# Patient Record
Sex: Male | Born: 1950 | Race: White | Hispanic: No | Marital: Married | Smoking: Current every day smoker
Health system: Southern US, Community
[De-identification: ages and names within clinical notes are randomized; demographics above are authoritative.]

## PROBLEM LIST (undated history)

## (undated) DIAGNOSIS — J449 Chronic obstructive pulmonary disease, unspecified: Secondary | ICD-10-CM

## (undated) DIAGNOSIS — E119 Type 2 diabetes mellitus without complications: Secondary | ICD-10-CM

## (undated) DIAGNOSIS — J439 Emphysema, unspecified: Secondary | ICD-10-CM

## (undated) DIAGNOSIS — R7303 Prediabetes: Secondary | ICD-10-CM

## (undated) DIAGNOSIS — C801 Malignant (primary) neoplasm, unspecified: Secondary | ICD-10-CM

## (undated) HISTORY — DX: Chronic obstructive pulmonary disease, unspecified: J44.9

## (undated) HISTORY — PX: PELVIC ABCESS DRAINAGE: SHX2189

## (undated) HISTORY — DX: Emphysema, unspecified: J43.9

## (undated) HISTORY — DX: Malignant (primary) neoplasm, unspecified: C80.1

## (undated) HISTORY — DX: Type 2 diabetes mellitus without complications: E11.9

## (undated) HISTORY — PX: APPENDECTOMY: SHX54

## (undated) HISTORY — DX: Prediabetes: R73.03

---

## 2014-07-06 ENCOUNTER — Emergency Department: Payer: Self-pay | Admitting: Emergency Medicine

## 2023-01-13 ENCOUNTER — Ambulatory Visit: Payer: Self-pay

## 2023-01-13 NOTE — Telephone Encounter (Signed)
  Chief Complaint: Frequency, urgency, painful urination. Also cloudy urine Symptoms: above Frequency: 5-6 days Pertinent Negatives: Patient denies fever Disposition: []$ ED /[x]$ Urgent Care (no appt availability in office) / []$ Appointment(In office/virtual)/ []$  [facility] [place]/ []$ Home Care/ []$ Refused Recommended Disposition /[]$ [facility]/ []$  Follow-up with PCP Additional Notes: PT has had s/s of a uti for 5-6 days. PT will go to UC for care.    Summary: possible UTI painful urination cloudy urine, frequent urination   Pt stated has possible UTI painful urination cloudy urine, frequent urination going on for about 5-6 days stated has been Azo it helped but has not cleared it up. Pt denied abdominal pain.  Pt scheduled for new pt appointment with [facility] for 02/14/2023.  Seeking clinical advice.     Reason for Disposition  Bad or foul-smelling urine  Answer Assessment - Initial Assessment Questions 1. SYMPTOM: "What's the main symptom you're concerned about?" (e.g., frequency, incontinence)     Frequency, painful urination, painful urination 2. ONSET: "When did the  *No Answer*  start?"     5-6 days ago 3. PAIN: "Is there any pain?" If Yes, ask: "How bad is it?" (Scale: 1-10; mild, moderate, severe)     yes 4. CAUSE: "What do you think is causing the symptoms?"     UTI 5. OTHER SYMPTOMS: "Do you have any other symptoms?" (e.g., blood in urine, fever, flank pain, pain with urination)     Pain with urination 6. PREGNANCY: "Is there any chance you are pregnant?" "When was your last menstrual period?"  Protocols used: Urinary Symptoms-A-AH

## 2023-02-13 ENCOUNTER — Telehealth: Payer: Self-pay

## 2023-02-13 NOTE — Telephone Encounter (Signed)
Patient has appointment with you tomorrow. Not sure if this message is just an [place]

## 2023-02-13 NOTE — Telephone Encounter (Signed)
Copied from [place] [phone]. Topic: General - Other >> Feb 13, 2023 12:48 PM Carrielelia G wrote: Reason for CRM: 4 wks ago patient had an appt.  with a provider at [facility] for a bladder infection, pt glucose was tested and it read at 255.  Provider suggested at this appt. Patient do a fasting glucose test.

## 2023-02-14 ENCOUNTER — Encounter: Payer: Self-pay | Admitting: Family Medicine

## 2023-02-14 ENCOUNTER — Ambulatory Visit (INDEPENDENT_AMBULATORY_CARE_PROVIDER_SITE_OTHER): Payer: Medicare PPO | Admitting: Family Medicine

## 2023-02-14 VITALS — BP 137/80 | HR 76 | Temp 98.1°F | Resp 14 | Ht 73.0 in | Wt 179.8 lb

## 2023-02-14 DIAGNOSIS — R81 Glycosuria: Secondary | ICD-10-CM | POA: Insufficient documentation

## 2023-02-14 DIAGNOSIS — R634 Abnormal weight loss: Secondary | ICD-10-CM | POA: Diagnosis not present

## 2023-02-14 DIAGNOSIS — Z7689 Persons encountering health services in other specified circumstances: Secondary | ICD-10-CM | POA: Insufficient documentation

## 2023-02-14 DIAGNOSIS — R300 Dysuria: Secondary | ICD-10-CM | POA: Insufficient documentation

## 2023-02-14 DIAGNOSIS — Z87891 Personal history of nicotine dependence: Secondary | ICD-10-CM | POA: Insufficient documentation

## 2023-02-14 LAB — POCT URINALYSIS DIPSTICK
Bilirubin, UA: NEGATIVE
Glucose, UA: POSITIVE — AB
Ketones, UA: NEGATIVE
Nitrite, UA: NEGATIVE
Protein, UA: NEGATIVE
Spec Grav, UA: 1.01 (ref 1.010–1.025)
Urobilinogen, UA: 0.2 E.U./dL
pH, UA: 6 (ref 5.0–8.0)

## 2023-02-14 MED ORDER — NITROFURANTOIN MONOHYD MACRO 100 MG PO CAPS: 100.0000 mg | ORAL_CAPSULE | Freq: Two times a day (BID) | ORAL | 0 refills | Status: DC

## 2023-02-14 NOTE — Progress Notes (Unsigned)
I,[name],acting as a scribe for [employer], MD.,have documented all relevant documentation on the behalf of Colin Foster, MD,as directed by  Colin Foster, MD while in the presence of Colin Foster, MD.  New patient visit   Patient: Colin Griffin   DOB: [birth year]   72 y.o. Male  MRN: [id number] Visit Date: 02/14/2023  Today's healthcare provider: [name], MD   Chief Complaint  Patient presents with   Colin Griffin is a 72 y.o. male who presents today as a new patient to establish care.  HPI   Urine Frequency: Patient is here is establish care but has been dealing with a bladder infection for a while. [facility] with bladder concerns. They did a UA and found blood and glucose in the urine. They did a fasting glucose test. Patient was put on Bactrim twice daily for 7 days. After about a week the symptoms came back. Patient symptoms are pain while using the bathroom, and using the bathroom more frequently. Reports that when he lies down to sleep at night, the urine frequency starts and has small amounts of urine. He reports that he was told he has MRSA in the urine, Reports hx of penile fracture and tissue damage, reports having prolonged irritation from stitching for the reconstruction of penile fracture, took 3 years for that to resolve   Pre-diabetes: Reports being pre-diabetes for close to 30 years   Non intentional weight loss: Reports that he lost 14 pounds since his visit in February, reports that he has seen eating and has not been experiencing nausea or vomiting   Social Hx  Wife works as a Therapist, sports  Patient has been married for 8 years   Past Medical History:  Diagnosis Date   Pre-diabetes    Past Surgical History:  Procedure Laterality Date   APPENDECTOMY     Family Status  Relation Name Status   Mother  (Not Specified)   MGM   (Not Specified)   Other  (Not Specified)   Family History  Problem Relation Age of Onset   Atrial fibrillation Mother    Cancer Maternal Grandmother    Diabetes Other    Social History   Socioeconomic History   Marital status: Married    Spouse name: Landscape architect   Number of children: 1   Years of education: Not on file   Highest education level: Master's degree (e.g., MA, MS, MEng, MEd, MSW, MBA)  Occupational History   Not on file  Tobacco Use   Smoking status: Every Day    Packs/day: 1.00    Years: 60.00    Additional pack years: 0.00    Total pack years: 60.00    Types: Cigarettes   Smokeless tobacco: Never   Tobacco comments:    Reports that at one time he smoked 3 packs per day while in the [employer]  Vaping Use   Vaping Use: Every day   Start date: 08/08/2022  Substance and Sexual Activity   Alcohol use: Never   Drug use: Never   Sexual activity: Not on file  Other Topics Concern   Not on file  Social History Narrative   ** Merged History Encounter **       Retired from Research officer, political party for 30 years    Social Determinants of Radio broadcast assistant Strain: Not on [employer] Insecurity: Not on file  Transportation Needs: Not on file  Physical Activity: Not on file  Stress: Not on file  Social Connections: Not on file   No outpatient medications prior to visit.   No facility-administered medications prior to visit.   No Known Allergies  Immunization History  Administered Date(s) Administered   PFIZER(Purple Top)SARS-COV-2 Vaccination 01/13/2020, 02/03/2020    Health Maintenance  Topic Date Due   Medicare Annual Wellness (AWV)  Never done   Pneumonia Vaccine 1+ Years old (1 of 2 - PCV) Never done   Hepatitis C Screening  Never done   DTaP/Tdap/Td (1 - Tdap) Never done   COLONOSCOPY (Pts 45-26yrs Insurance coverage will need to be confirmed)  Never done   Lung Cancer Screening  Never done   Zoster Vaccines- Shingrix (1 of 2) Never  done   INFLUENZA VACCINE  Never done   COVID-19 Vaccine (3 - 2023-24 season) 07/29/2022   HPV VACCINES  Aged Out    Patient Care Team: Colin Foster, MD as PCP - General (Family Medicine)  Review of Systems  Constitutional:  Positive for unexpected weight change. Negative for appetite change, chills and fever.  Respiratory:  Positive for cough.   Genitourinary:  Positive for difficulty urinating, dysuria and frequency.  All other systems reviewed and are negative.       Objective    BP 137/80 (BP Location: Left Arm, Patient Position: Sitting, Cuff Size: Normal)   Pulse 76   Temp 98.1 F (36.7 C) (Oral)   Resp 14   Ht 6\' 1"  (1.854 m)   Wt 179 lb 12.8 oz (81.6 kg)   BMI 23.72 kg/m     Physical Exam Vitals reviewed.  Constitutional:      General: He is not in acute distress.    Appearance: Normal appearance. He is not ill-appearing, toxic-appearing or diaphoretic.  Eyes:     Conjunctiva/sclera: Conjunctivae normal.  Neck:     Thyroid: No thyroid mass, thyromegaly or thyroid tenderness.     Vascular: No carotid bruit.  Cardiovascular:     Rate and Rhythm: Normal rate and regular rhythm.     Pulses: Normal pulses.     Heart sounds: Normal heart sounds. No murmur heard.    No friction rub. No gallop.  Pulmonary:     Effort: Pulmonary effort is normal. No respiratory distress.     Breath sounds: Normal breath sounds. No stridor. No wheezing, rhonchi or rales.  Abdominal:     General: Bowel sounds are normal. There is no distension.     Palpations: Abdomen is soft. There is no hepatomegaly, splenomegaly or mass.     Tenderness: There is abdominal tenderness in the suprapubic area.  Musculoskeletal:     Right lower leg: No edema.     Left lower leg: No edema.  Lymphadenopathy:     Cervical: No cervical adenopathy.  Skin:    Findings: No erythema or rash.  Neurological:     Mental Status: He is alert and oriented to person, place, and time.      Depression Screen    02/14/2023    2:20 PM  PHQ 2/9 Scores  PHQ - 2 Score 0  PHQ- 9 Score 0   Results for orders placed or performed in visit on 02/14/23  Hemoglobin A1c  Result Value Ref Range   Hgb A1c MFr Bld 11.1 (H) 4.8 - 5.6 %   Est. average glucose Bld gHb Est-mCnc 272 mg/dL  Comprehensive metabolic panel  Result Value Ref Range  Glucose 211 (H) 70 - 99 mg/dL   BUN 12 8 - 27 mg/dL   Creatinine, Ser 0.87 0.76 - 1.27 mg/dL   eGFR 93 >59 mL/min/1.73   BUN/Creatinine Ratio 14 10 - 24   Sodium 136 134 - 144 mmol/L   Potassium 3.9 3.5 - 5.2 mmol/L   Chloride 100 96 - 106 mmol/L   CO2 20 20 - 29 mmol/L   Calcium 9.4 8.6 - 10.2 mg/dL   Total Protein 7.1 6.0 - 8.5 g/dL   Albumin 4.1 3.9 - 4.9 g/dL   Globulin, Total 3.0 1.5 - 4.5 g/dL   Albumin/Globulin Ratio 1.4 1.2 - 2.2   Bilirubin Total 0.4 0.0 - 1.2 mg/dL   Alkaline Phosphatase 118 44 - 121 IU/L   AST 16 0 - 40 IU/L   ALT 17 0 - 44 IU/L  Urine Microscopic  Result Value Ref Range   WBC, UA >30 (A) 0 - 5 /hpf   RBC, Urine None seen 0 - 2 /hpf   Epithelial Cells (non renal) None seen 0 - 10 /hpf   Casts None seen None seen /lpf   Bacteria, UA None seen None seen/Few  TSH + free T4  Result Value Ref Range   TSH 0.641 0.450 - 4.500 uIU/mL   Free T4 1.35 0.82 - 1.77 ng/dL  CBC  Result Value Ref Range   WBC 14.4 (H) 3.4 - 10.8 x10E3/uL   RBC 4.29 4.14 - 5.80 x10E6/uL   Hemoglobin 13.3 13.0 - 17.7 g/dL   Hematocrit 38.7 37.5 - 51.0 %   MCV 90 79 - 97 fL   MCH 31.0 26.6 - 33.0 pg   MCHC 34.4 31.5 - 35.7 g/dL   RDW 12.1 11.6 - 15.4 %   Platelets 218 150 - 450 x10E3/uL  Specimen status report  Result Value Ref Range   specimen status report Comment   POCT urinalysis dipstick  Result Value Ref Range   Color, UA Yellow    Clarity, UA Cloudy    Glucose, UA Positive (A) Negative   Bilirubin, UA Negative    Ketones, UA Negative    Spec Grav, UA 1.010 1.010 - 1.025   Blood, UA Moderate    pH, UA 6.0 5.0 -  8.0   Protein, UA Negative Negative   Urobilinogen, UA 0.2 0.2 or 1.0 E.U./dL   Nitrite, UA Negative    Leukocytes, UA Moderate (2+) (A) Negative   Appearance     Odor      Assessment & Plan      Problem List Items Addressed This Visit       Other   Weight loss, non-intentional    Patient reports over 10 pound weight loss in the last month Will order CBC, CMP, A1c, TSH and free T4 and referral for chest CT submitted today Given patient's smoking history and bladder symptoms we will be important for him to have appropriate follow-up for specialist referral submitted today regarding urology and lung cancer screening Patient also reports symptoms of cough, no night sweats       Relevant Orders   Comprehensive metabolic panel (Completed)   TSH + free T4   CBC   Glucosuria    Patient with 2 urinalyses with glucosuria Previous hemoglobin A1c's have been in prediabetic range Will repeat hemoglobin A1c today       Relevant Orders   Hemoglobin A1c (Completed)   Dysuria - Primary    Symptoms have been present for weeks, previously  treated with Bactrim, completed course Repeat urinalysis and urine culture collected today Urine analysis consistent with hematuria, Previous culture was reviewed from urgent care visit on 01/13/2023 that grew MRSA that was resistant to ciprofloxacin and oxacillin and intermediate for levofloxacin Will treat patient with nitrofurantoin for 7-day course Place referral for urology as well given persistent symptoms despite treatment      Relevant Orders   POCT urinalysis dipstick (Completed)   Urine Culture   Ambulatory referral to Urology   Urine Microscopic (Completed)   History of smoking greater than 50 pack years    Patient continues to smoke daily Reports smoking for at least 60 years anywhere from 1 pack/day to 3 packs/day throughout that time Recommended referral for chest CT lung cancer screening, patient was agreeable Referral placed  today Counseled patient on importance of smoking cessation      Relevant Orders   Ambulatory Referral Lung Cancer Screening [facility]   Urine Microscopic (Completed)   Establishing care with new doctor, encounter for    Welcomed patient to [facility] patient's medical history, medications, surgical and social history Discussed roles and expectations for primary care physician-patient relationship Recommended patient schedule annual preventative examinations          Return in about 2 months (around 04/16/2023) for AWV .      The entirety of the information documented in the History of Present Illness, Review of Systems and Physical Exam were personally obtained by me. Portions of this information were initially documented by [name], CMA. I, Colin Foster, MD have reviewed the documentation above for thoroughness and accuracy.   Colin Foster, MD  [facility] [phone] (phone) [phone] (fax)  [place]

## 2023-02-14 NOTE — Patient Instructions (Addendum)
It was a pleasure meeting you today!  Welcome to [facility].  I look forward to taking part in your care as your new primary care physician.    Summary of our discussion today:   For your urinary symptoms, we will treat you with Macrobid twice daily for 7 days.  We will collect lab work today to investigate reasons for weight loss  I have placed a referral for urology. Please on the lookout for a call to schedule this appointment and let us know if you do not hear anything after two weeks.  I have also placed a referral for chest CT screening, please be on the lookout for a call.    You should return to our clinic in 2 months   Best Wishes,   Dr. Quentin Cornwall

## 2023-02-14 NOTE — Assessment & Plan Note (Signed)
Welcomed patient to [facility]  Reviewed patient's medical history, medications, surgical and social history Discussed roles and expectations for primary care physician-patient relationship Recommended patient schedule annual preventative examinations   

## 2023-02-15 ENCOUNTER — Other Ambulatory Visit: Payer: Self-pay | Admitting: Family Medicine

## 2023-02-15 ENCOUNTER — Encounter: Payer: Self-pay | Admitting: Family Medicine

## 2023-02-15 DIAGNOSIS — E1165 Type 2 diabetes mellitus with hyperglycemia: Secondary | ICD-10-CM | POA: Insufficient documentation

## 2023-02-15 LAB — CBC
Hematocrit: 38.7 % (ref 37.5–51.0)
Hemoglobin: 13.3 g/dL (ref 13.0–17.7)
MCH: 31 pg (ref 26.6–33.0)
MCHC: 34.4 g/dL (ref 31.5–35.7)
MCV: 90 fL (ref 79–97)
Platelets: 218 10*3/uL (ref 150–450)
RBC: 4.29 x10E6/uL (ref 4.14–5.80)
RDW: 12.1 % (ref 11.6–15.4)
WBC: 14.4 10*3/uL — ABNORMAL HIGH (ref 3.4–10.8)

## 2023-02-15 LAB — COMPREHENSIVE METABOLIC PANEL
ALT: 17 IU/L (ref 0–44)
AST: 16 IU/L (ref 0–40)
Albumin/Globulin Ratio: 1.4 (ref 1.2–2.2)
Albumin: 4.1 g/dL (ref 3.9–4.9)
Alkaline Phosphatase: 118 IU/L (ref 44–121)
BUN/Creatinine Ratio: 14 (ref 10–24)
BUN: 12 mg/dL (ref 8–27)
Bilirubin Total: 0.4 mg/dL (ref 0.0–1.2)
CO2: 20 mmol/L (ref 20–29)
Calcium: 9.4 mg/dL (ref 8.6–10.2)
Chloride: 100 mmol/L (ref 96–106)
Creatinine, Ser: 0.87 mg/dL (ref 0.76–1.27)
Globulin, Total: 3 g/dL (ref 1.5–4.5)
Glucose: 211 mg/dL — ABNORMAL HIGH (ref 70–99)
Potassium: 3.9 mmol/L (ref 3.5–5.2)
Sodium: 136 mmol/L (ref 134–144)
Total Protein: 7.1 g/dL (ref 6.0–8.5)
eGFR: 93 mL/min/{1.73_m2} (ref >59)

## 2023-02-15 LAB — SPECIMEN STATUS REPORT

## 2023-02-15 LAB — URINALYSIS, MICROSCOPIC ONLY
Bacteria, UA: NONE SEEN
Casts: NONE SEEN /lpf
Epithelial Cells (non renal): NONE SEEN /hpf (ref 0–10)
RBC, Urine: NONE SEEN /hpf (ref 0–2)
WBC, UA: >30 /hpf — AB (ref 0–5)

## 2023-02-15 LAB — TSH+FREE T4
Free T4: 1.35 ng/dL (ref 0.82–1.77)
TSH: 0.641 u[IU]/mL (ref 0.450–4.500)

## 2023-02-15 LAB — HEMOGLOBIN A1C
Est. average glucose Bld gHb Est-mCnc: 272 mg/dL
Hgb A1c MFr Bld: 11.1 % — ABNORMAL HIGH (ref 4.8–5.6)

## 2023-02-15 MED ORDER — METFORMIN HCL ER 500 MG PO TB24: 500.0000 mg | ORAL_TABLET | Freq: Every day | ORAL | 1 refills | Status: DC

## 2023-02-15 NOTE — Assessment & Plan Note (Signed)
Symptoms have been present for weeks, previously treated with Bactrim, completed course Repeat urinalysis and urine culture collected today Urine analysis consistent with hematuria, Previous culture was reviewed from urgent care visit on 01/13/2023 that grew MRSA that was resistant to ciprofloxacin and oxacillin and intermediate for levofloxacin Will treat patient with nitrofurantoin for 7-day course Place referral for urology as well given persistent symptoms despite treatment

## 2023-02-15 NOTE — Assessment & Plan Note (Signed)
Patient continues to smoke daily Reports smoking for at least 60 years anywhere from 1 pack/day to 3 packs/day throughout that time Recommended referral for chest CT lung cancer screening, patient was agreeable Referral placed today Counseled patient on importance of smoking cessation

## 2023-02-15 NOTE — Assessment & Plan Note (Addendum)
Patient reports over 10 pound weight loss in the last month Will order CBC, CMP, A1c, TSH and free T4 and referral for chest CT submitted today Given patient's smoking history and bladder symptoms we will be important for him to have appropriate follow-up for specialist referral submitted today regarding urology and lung cancer screening Patient also reports symptoms of cough, no night sweats

## 2023-02-15 NOTE — Assessment & Plan Note (Signed)
Patient with 2 urinalyses with glucosuria Previous hemoglobin A1c's have been in prediabetic range Will repeat hemoglobin A1c today

## 2023-02-17 LAB — URINE CULTURE

## 2023-02-17 LAB — SPECIMEN STATUS REPORT

## 2023-02-17 NOTE — Progress Notes (Unsigned)
I,Colin Griffin,acting as a scribe for [employer], MD.,have documented all relevant documentation on the behalf of Colin Foster, MD,as directed by  Colin Foster, MD while in the presence of Colin Foster, MD.   Established patient visit   Patient: Colin Griffin   DOB: [birth year]   72 y.o. Male  MRN: [id number] Visit Date: 02/20/2023  Today's healthcare provider: [name], MD   Chief Complaint  Patient presents with   Follow-up DM   Subjective    HPI  Type 2 DM Patient coming in to discuss DM treatment options and recommendations. States that he has started metformin and has changed his diet to include mostly grilled fish, grilled chicken has been eating cabbage, collard greens, pinto beans, steamed potatoes He reports he has eliminated sugar  He has started eating breakfast, two eggs in the AM  He denies upset stomach with metformin    Urinary Symptoms  States that he feels that his abdomen is more tender, he has worsened urinary frequency, reports that he was up every 20 minutes last night  States he feels like he is sitting on a brick  He reports he has decreased urinary output, dribbling despite going to the bathroom every 15-20 mins with frequency  Reports that putting on his belt is painful for his lower abdomen  He reports feeling like he may pass out due to pain and difficulty with urinating, states he has an increasingly difficult time getting urine to flow  Denies fevers  Denies back pain  States symptoms minimally improved with bactrim   Medications: Outpatient Medications Prior to Visit  Medication Sig   metFORMIN (GLUCOPHAGE-XR) 500 MG 24 hr tablet Take 1 tablet (500 mg total) by mouth daily with breakfast.   [DISCONTINUED] nitrofurantoin, macrocrystal-monohydrate, (MACROBID) 100 MG capsule Take 1 capsule (100 mg total) by mouth 2 (two) times daily for 7 days.   No facility-administered  medications prior to visit.    Review of Systems     Objective    BP (!) 148/82 (BP Location: Left Arm, Patient Position: Sitting, Cuff Size: Normal)   Pulse 79   Temp 98.6 F (37 C) (Oral)   Resp 16   Wt 184 lb 8 oz (83.7 kg)   BMI 24.34 kg/m    Physical Exam Constitutional:      Appearance: He is ill-appearing. He is not toxic-appearing.     Comments: Patient appears uncomfortable while seated   Pulmonary:     Effort: Pulmonary effort is normal. No respiratory distress.  Abdominal:     Palpations: Abdomen is soft.     Tenderness: There is abdominal tenderness in the suprapubic area. There is no right CVA tenderness or left CVA tenderness.  Neurological:     Mental Status: He is alert.       No results found for any visits on 02/20/23.  Assessment & Plan     Problem List Items Addressed This Visit       Endocrine   Type 2 diabetes mellitus with hyperglycemia, without long-term current use of insulin ([place]) - Primary    Newly diagnosed  Lab Results  Component Value Date   HGBA1C 11.1 (H) 02/14/2023  With urinary symptoms and patient in obvious discomfort recommended that we avoid starting insulin and glucose checks right now  We discussed options for oral medications and injectables  Will start truclity 0.75mg  once weekly and follow up in 3 weeks  Will also have patient ramp up to  1000mg  twice daily        Relevant Medications   Dulaglutide (TRULICITY) [id number] [id number] SOPN     Other   Dysuria    Acute  Symptoms worsened since starting nitrofurantoin  Will treat as prostatitis with Bactrim 5800-160mg  twice daily for 10 days Given concern for increased pain, difficulty with urinating, and worsened urinary frequency, I discussed with patient concern that he may have structural component causing worsening urinary symptoms and would recommend ED visit if no improvement within the next 12-24 hours as he may need a catheter to help with emptying bladder  Patient  had to use the restroom twice due to increased frequency during office visit today  Urology was able to move patient to earlier appt on 03/01/23 instead of 03/08/23 after contacting them via phone today  Patient to follow up PRN          Return in about 3 weeks (around 03/13/2023) for diabetes follow up .        The entirety of the information documented in the History of Present Illness, Review of Systems and Physical Exam were personally obtained by me. Portions of this information were initially documented by Colin Griffin, CMA. I, Colin Foster, MD have reviewed the documentation above for thoroughness and accuracy.   Colin Foster, MD  [facility] [phone] (phone) [phone] (fax)  [place]

## 2023-02-20 ENCOUNTER — Other Ambulatory Visit: Payer: Self-pay

## 2023-02-20 ENCOUNTER — Encounter: Payer: Self-pay | Admitting: Family Medicine

## 2023-02-20 ENCOUNTER — Emergency Department
Admission: EM | Admit: 2023-02-20 | Discharge: 2023-02-20 | Disposition: A | Discharge status: Home / Self Care | Payer: Medicare PPO | Attending: Emergency Medicine | Admitting: Emergency Medicine

## 2023-02-20 ENCOUNTER — Ambulatory Visit (INDEPENDENT_AMBULATORY_CARE_PROVIDER_SITE_OTHER): Payer: Medicare PPO | Admitting: Family Medicine

## 2023-02-20 VITALS — BP 148/82 | HR 79 | Temp 98.6°F | Resp 16 | Wt 184.5 lb

## 2023-02-20 DIAGNOSIS — R300 Dysuria: Secondary | ICD-10-CM

## 2023-02-20 DIAGNOSIS — E119 Type 2 diabetes mellitus without complications: Secondary | ICD-10-CM | POA: Insufficient documentation

## 2023-02-20 DIAGNOSIS — R339 Retention of urine, unspecified: Secondary | ICD-10-CM | POA: Diagnosis not present

## 2023-02-20 DIAGNOSIS — E1165 Type 2 diabetes mellitus with hyperglycemia: Secondary | ICD-10-CM

## 2023-02-20 DIAGNOSIS — Z7984 Long term (current) use of oral hypoglycemic drugs: Secondary | ICD-10-CM | POA: Diagnosis not present

## 2023-02-20 LAB — URINALYSIS, ROUTINE W REFLEX MICROSCOPIC
Bacteria, UA: NONE SEEN
Bilirubin Urine: NEGATIVE
Glucose, UA: 150 mg/dL — AB
Hgb urine dipstick: NEGATIVE
Ketones, ur: 5 mg/dL — AB
Leukocytes,Ua: NEGATIVE
Nitrite: NEGATIVE
Protein, ur: NEGATIVE mg/dL
Specific Gravity, Urine: 1.008 (ref 1.005–1.030)
Squamous Epithelial / HPF: NONE SEEN /HPF (ref 0–5)
pH: 6 (ref 5.0–8.0)

## 2023-02-20 LAB — COMPREHENSIVE METABOLIC PANEL
ALT: 22 U/L (ref 0–44)
AST: 20 U/L (ref 15–41)
Albumin: 3.5 g/dL (ref 3.5–5.0)
Alkaline Phosphatase: 87 U/L (ref 38–126)
Anion gap: 6 (ref 5–15)
BUN: 14 mg/dL (ref 8–23)
CO2: 22 mmol/L (ref 22–32)
Calcium: 8.8 mg/dL — ABNORMAL LOW (ref 8.9–10.3)
Chloride: 103 mmol/L (ref 98–111)
Creatinine, Ser: 0.82 mg/dL (ref 0.61–1.24)
GFR, Estimated: >60 mL/min (ref >60)
Glucose, Bld: 148 mg/dL — ABNORMAL HIGH (ref 70–99)
Potassium: 3.9 mmol/L (ref 3.5–5.1)
Sodium: 131 mmol/L — ABNORMAL LOW (ref 135–145)
Total Bilirubin: 0.8 mg/dL (ref 0.3–1.2)
Total Protein: 8.1 g/dL (ref 6.5–8.1)

## 2023-02-20 LAB — CBC
HCT: 38.6 % — ABNORMAL LOW (ref 39.0–52.0)
Hemoglobin: 12.9 g/dL — ABNORMAL LOW (ref 13.0–17.0)
MCH: 30.5 pg (ref 26.0–34.0)
MCHC: 33.4 g/dL (ref 30.0–36.0)
MCV: 91.3 fL (ref 80.0–100.0)
Platelets: 331 10*3/uL (ref 150–400)
RBC: 4.23 MIL/uL (ref 4.22–5.81)
RDW: 12.1 % (ref 11.5–15.5)
WBC: 17.1 10*3/uL — ABNORMAL HIGH (ref 4.0–10.5)
nRBC: 0 % (ref 0.0–0.2)

## 2023-02-20 MED ORDER — SULFAMETHOXAZOLE-TRIMETHOPRIM 800-160 MG PO TABS: 1.0000 | ORAL_TABLET | Freq: Two times a day (BID) | ORAL | 0 refills | Status: DC

## 2023-02-20 MED ORDER — TRULICITY 0.75 MG/0.5ML ~~LOC~~ SOAJ: 0.7500 mg | SUBCUTANEOUS | 3 refills | Status: DC

## 2023-02-20 NOTE — ED Notes (Signed)
Foley catheter output 1,400 mL emptied at this time

## 2023-02-20 NOTE — ED Triage Notes (Signed)
Pt to ED for UTI for a month, reports is now only able to urinate a little at a time. Has been on antibiotics.

## 2023-02-20 NOTE — ED Provider Notes (Signed)
[facility] Provider Note    Event Date/Time   First MD Initiated Contact with Patient 02/20/23 1646     (approximate)   History      HPI  Colin Griffin is a 72 y.o. male who presents with dysuria.  Patient reports nearly 6 weeks of symptoms, was put on Bactrim early on with some improvement in symptoms, saw new PCP recently and was diagnosed with diabetes, started metformin, also tried Macrobid with little improvement.  Was started on Bactrim again today.  Has difficulty urinating at times and does feel discomfort between his scrotum and his rectum.  No fevers or chills.  No nausea or vomiting.  No abdominal pain.  No back pain or flank pain        Physical Exam   Triage Vital Signs: ED Triage Vitals  Enc Vitals Group     BP 02/20/23 1627 (!) 150/75     Pulse Rate 02/20/23 1627 84     Resp 02/20/23 1627 18     Temp 02/20/23 1627 97.8 F (36.6 C)     Temp Source 02/20/23 1627 Oral     SpO2 02/20/23 1627 97 %     Weight 02/20/23 1628 83.5 kg (184 lb)     Height 02/20/23 1628 1.854 m (6\' 1" )     Head Circumference --      Peak Flow --      Pain Score 02/20/23 1627 7     Pain Loc --      Pain Edu? --      Excl. in [place]? --     Most recent vital signs: Vitals:   02/20/23 1627  BP: (!) 150/75  Pulse: 84  Resp: 18  Temp: 97.8 F (36.6 C)  SpO2: 97%     General: Awake, no distress.  CV:  Good peripheral perfusion.  Resp:  Normal effort.  Abd:  No distention.  Other:     ED Results / Procedures / Treatments   Labs (all labs ordered are listed, but only abnormal results are displayed) Labs Reviewed  URINALYSIS, ROUTINE W REFLEX MICROSCOPIC - Abnormal; Notable for the following components:      Result Value   Color, Urine YELLOW (*)    APPearance CLEAR (*)    Glucose, UA 150 (*)    Ketones, ur 5 (*)    All other components within normal limits  CBC - Abnormal; Notable for the following components:   WBC 17.1 (*)     Hemoglobin 12.9 (*)    HCT 38.6 (*)    All other components within normal limits  COMPREHENSIVE METABOLIC PANEL - Abnormal; Notable for the following components:   Sodium 131 (*)    Glucose, Bld 148 (*)    Calcium 8.8 (*)    All other components within normal limits  URINE CULTURE     EKG     RADIOLOGY     PROCEDURES:  Critical Care performed:   Procedures   MEDICATIONS ORDERED IN ED: Medications - No data to display   IMPRESSION / MDM / [place] / ED COURSE  I reviewed the triage vital signs and the nursing notes. Patient's presentation is most consistent with acute presentation with potential threat to life or bodily function.  Patient presents with ongoing UTI symptoms appear to be worsening and may have caused prostatitis as well.  Patient had a culture performed on March 19 which grew Staph aureus which is an unusual  UTI bacteria, to be clear this was not labeled as methicillin-resistant and hence could theoretically be treated with Augmentin or a cephalosporin  Will obtain bladder scan, check labs, repeat urinalysis and culture here,   Bladder scan demonstrates over a liter, Foley catheter placed and the patient had immediate relief of all discomfort.  His urinalysis is unremarkable, his white blood cell count is elevated likely related to significant and prolonged urinary retention  Will send urine culture but at this time no antibiotics indicated given that patient has had resolution of symptoms.  He has urology follow-up for further evaluation of difficulty urinating/urinary retention  Will discharge with Foley leg bag, return precautions discussed        FINAL CLINICAL IMPRESSION(S) / ED DIAGNOSES   Final diagnoses:  Urinary retention     Rx / DC Orders   ED Discharge Orders          Ordered    Ambulatory referral to Urology        02/20/23 1815             Note:  This document was prepared using Dragon voice recognition  software and may include unintentional dictation errors.   Lavonia Drafts, MD 02/20/23 [phone]

## 2023-02-20 NOTE — ED Notes (Signed)
Foley catheter placed at this time. 16 french.

## 2023-02-20 NOTE — ED Notes (Signed)
Bladder scan done at this time. Bladder scan showed >999

## 2023-02-20 NOTE — ED Notes (Signed)
Pt able to give urine sample. 

## 2023-02-20 NOTE — Patient Instructions (Addendum)
It was a pleasure to see you today!  Thank you for choosing [facility] for your primary care.   Colin Griffin was seen for diabetes and urinary symptoms.   Our plans for today were: We will work to see if we can get your urology appointment sooner than April 10th  I recommend going to the emergency department if pain worsens or you are unable to fully empty your bladder, I have written for bactrim twice daily for 10 days.  For your diabetes, please increase to metformin 1000mg  daily  I have added Trulicity 0.75mg  once per week   To keep you healthy, please keep in mind the following health maintenance items that you are due for:   Diabetes eye exam   Pneumonia Vaccine  Shingrix vaccine  Colonoscopy  Tetanus booster    You should return to our clinic in 3 weeks for diabetes follow up   Best Wishes,   Dr. Quentin Cornwall

## 2023-02-20 NOTE — Assessment & Plan Note (Signed)
Newly diagnosed  Lab Results  Component Value Date   HGBA1C 11.1 (H) 02/14/2023   With urinary symptoms and patient in obvious discomfort recommended that we avoid starting insulin and glucose checks right now  We discussed options for oral medications and injectables  Will start truclity 0.75mg  once weekly and follow up in 3 weeks  Will also have patient ramp up to 1000mg  twice daily

## 2023-02-20 NOTE — Assessment & Plan Note (Signed)
Acute  Symptoms worsened since starting nitrofurantoin  Will treat as prostatitis with Bactrim 5800-160mg  twice daily for 10 days Given concern for increased pain, difficulty with urinating, and worsened urinary frequency, I discussed with patient concern that he may have structural component causing worsening urinary symptoms and would recommend ED visit if no improvement within the next 12-24 hours as he may need a catheter to help with emptying bladder  Patient had to use the restroom twice due to increased frequency during office visit today  Urology was able to move patient to earlier appt on 03/01/23 instead of 03/08/23 after contacting them via phone today  Patient to follow up PRN

## 2023-02-22 ENCOUNTER — Other Ambulatory Visit: Payer: Self-pay | Admitting: Family Medicine

## 2023-02-22 ENCOUNTER — Telehealth: Payer: Self-pay

## 2023-02-22 LAB — URINE CULTURE: Culture: NO GROWTH

## 2023-02-22 MED ORDER — TAMSULOSIN HCL 0.4 MG PO CAPS: 0.4000 mg | ORAL_CAPSULE | Freq: Two times a day (BID) | ORAL | 0 refills | Status: DC

## 2023-02-22 MED ORDER — SOLIFENACIN SUCCINATE 5 MG PO TABS: 5.0000 mg | ORAL_TABLET | Freq: Every day | ORAL | 0 refills | Status: DC

## 2023-02-22 NOTE — Telephone Encounter (Signed)
Please Review for dr. Quentin Cornwall and advise.

## 2023-02-22 NOTE — Telephone Encounter (Signed)
Copied from [place] [id number]. Topic: General - Other >> Feb 22, 2023 10:55 AM Oley Balm A wrote: Reason for CRM: Pt states after he seen at the office the other day he did have to wind up going to the ER. The ER took 1500 ml out of his bladder and was told that it look like the bladder infection is gone but his white blood count is still up. The ER doctor did put a foley in and does not like leaving them in more than 3 days but pt does not have an appt with Urology until 03/01/23. Pt is needing advise from PCP.  Please call pt back.

## 2023-02-24 NOTE — Telephone Encounter (Signed)
Patient advised.

## 2023-02-24 NOTE — Telephone Encounter (Signed)
It is ok for him to keep the foley catheter until he is seen at urology on 4/3

## 2023-02-27 ENCOUNTER — Ambulatory Visit: Payer: Self-pay | Admitting: *Deleted

## 2023-02-27 MED ORDER — TRAMADOL HCL 50 MG PO TABS: 50.0000 mg | ORAL_TABLET | Freq: Three times a day (TID) | ORAL | 0 refills | Status: DC | PRN

## 2023-02-27 NOTE — Telephone Encounter (Signed)
Patient advised. Stated he will probably go to [facility] ER.

## 2023-02-27 NOTE — Telephone Encounter (Signed)
I would recommend urgent evaluation with increasing pain in setting of foley and antibiotic treatment  I can send prescription to try and help with pain but if there is not improvement, I would recommend he be seen in the ED today.   [name], MD  [facility]

## 2023-02-27 NOTE — Telephone Encounter (Signed)
Summary: pain with urination   Per Agent: "Patient is in a lot of pain/urination he wants a medication for pain, has an appt in 2 days but the pain in unbearable. Possible blockage"       Chief Complaint: Rectal pain Symptoms: Pt has foley cath, states is draining well. Reports 7-8/10 rectal pain, onset "Same time all this started". Seen in OV and sent to ED 02/20/23 ,on Bactrim. States "Some kind of infection."  Frequency: 02/17/24 Pertinent Negatives: Patient denies  Disposition: [] ED /[] Urgent Care (no appt availability in office) / [] Appointment(In office/virtual)/ []  [employer] [facility]/ [] Home Care/ [] Refused Recommended Disposition /[] [place] [facility]/ [x]  Follow-up with PCP Additional Notes: Pt has urology appt 03/01/23, new pt. Requesting pain med. Has tried OTC meds, ineffective. Seen 02/20/23.   Please advise.  Reason for Disposition  All other urine symptoms  Answer Assessment - Initial Assessment Questions 1. SYMPTOM: "What's the main symptom you're concerned about?" (e.g., frequency, incontinence)      2. ONSET: "When did the start?"      3. PAIN: "Is there any pain?" If Yes, ask: "How bad is it?" (Scale: 1-10; mild, moderate, severe)     7-8/10 4. CAUSE: "What do you think is causing the symptoms?"     :Mass" 5. OTHER SYMPTOMS: "Do you have any other symptoms?" (e.g., blood in urine, fever, flank pain, pain with urination)     Constant in rectum  Protocols used: Urinary Symptoms-A-AH

## 2023-03-01 ENCOUNTER — Encounter: Payer: Self-pay | Admitting: Urology

## 2023-03-01 ENCOUNTER — Ambulatory Visit: Payer: Medicare PPO | Admitting: Urology

## 2023-03-01 ENCOUNTER — Other Ambulatory Visit: Payer: Self-pay

## 2023-03-01 ENCOUNTER — Ambulatory Visit
Admission: RE | Admit: 2023-03-01 | Discharge: 2023-03-01 | Disposition: A | Discharge status: Home / Self Care | Payer: Medicare PPO | Source: Ambulatory Visit | Attending: Urology | Admitting: Urology

## 2023-03-01 ENCOUNTER — Encounter: Payer: Self-pay | Admitting: *Deleted

## 2023-03-01 ENCOUNTER — Emergency Department: Payer: Medicare PPO

## 2023-03-01 ENCOUNTER — Inpatient Hospital Stay
Admission: EM | Admit: 2023-03-01 | Discharge: 2023-03-07 | DRG: 713 | Disposition: A | Discharge status: Home / Self Care | Payer: Medicare PPO | Source: Ambulatory Visit | Attending: Internal Medicine | Admitting: Internal Medicine

## 2023-03-01 VITALS — BP 124/68 | HR 93 | Ht 72.0 in | Wt 175.0 lb

## 2023-03-01 DIAGNOSIS — K5900 Constipation, unspecified: Secondary | ICD-10-CM | POA: Diagnosis present

## 2023-03-01 DIAGNOSIS — Z833 Family history of diabetes mellitus: Secondary | ICD-10-CM

## 2023-03-01 DIAGNOSIS — R339 Retention of urine, unspecified: Secondary | ICD-10-CM

## 2023-03-01 DIAGNOSIS — N412 Abscess of prostate: Secondary | ICD-10-CM | POA: Diagnosis present

## 2023-03-01 DIAGNOSIS — R61 Generalized hyperhidrosis: Secondary | ICD-10-CM | POA: Diagnosis not present

## 2023-03-01 DIAGNOSIS — K651 Peritoneal abscess: Secondary | ICD-10-CM | POA: Diagnosis present

## 2023-03-01 DIAGNOSIS — B9561 Methicillin susceptible Staphylococcus aureus infection as the cause of diseases classified elsewhere: Secondary | ICD-10-CM | POA: Diagnosis present

## 2023-03-01 DIAGNOSIS — Z7984 Long term (current) use of oral hypoglycemic drugs: Secondary | ICD-10-CM | POA: Diagnosis not present

## 2023-03-01 DIAGNOSIS — M25552 Pain in left hip: Secondary | ICD-10-CM | POA: Diagnosis not present

## 2023-03-01 DIAGNOSIS — R634 Abnormal weight loss: Secondary | ICD-10-CM | POA: Diagnosis present

## 2023-03-01 DIAGNOSIS — E1165 Type 2 diabetes mellitus with hyperglycemia: Secondary | ICD-10-CM | POA: Diagnosis not present

## 2023-03-01 DIAGNOSIS — R1900 Intra-abdominal and pelvic swelling, mass and lump, unspecified site: Secondary | ICD-10-CM

## 2023-03-01 DIAGNOSIS — Z79899 Other long term (current) drug therapy: Secondary | ICD-10-CM | POA: Diagnosis not present

## 2023-03-01 DIAGNOSIS — Z87440 Personal history of urinary (tract) infections: Secondary | ICD-10-CM | POA: Diagnosis not present

## 2023-03-01 DIAGNOSIS — R7401 Elevation of levels of liver transaminase levels: Secondary | ICD-10-CM | POA: Diagnosis present

## 2023-03-01 DIAGNOSIS — C61 Malignant neoplasm of prostate: Secondary | ICD-10-CM | POA: Diagnosis present

## 2023-03-01 DIAGNOSIS — N419 Inflammatory disease of prostate, unspecified: Secondary | ICD-10-CM | POA: Diagnosis present

## 2023-03-01 DIAGNOSIS — R7989 Other specified abnormal findings of blood chemistry: Secondary | ICD-10-CM | POA: Diagnosis present

## 2023-03-01 DIAGNOSIS — R748 Abnormal levels of other serum enzymes: Secondary | ICD-10-CM | POA: Diagnosis present

## 2023-03-01 DIAGNOSIS — F17290 Nicotine dependence, other tobacco product, uncomplicated: Secondary | ICD-10-CM | POA: Diagnosis present

## 2023-03-01 DIAGNOSIS — Z7985 Long-term (current) use of injectable non-insulin antidiabetic drugs: Secondary | ICD-10-CM

## 2023-03-01 LAB — HEPATIC FUNCTION PANEL
ALT: 48 U/L — ABNORMAL HIGH (ref 0–44)
AST: 40 U/L (ref 15–41)
Albumin: 3.2 g/dL — ABNORMAL LOW (ref 3.5–5.0)
Alkaline Phosphatase: 186 U/L — ABNORMAL HIGH (ref 38–126)
Bilirubin, Direct: 0.2 mg/dL (ref 0.0–0.2)
Indirect Bilirubin: 0.6 mg/dL (ref 0.3–0.9)
Total Bilirubin: 0.8 mg/dL (ref 0.3–1.2)
Total Protein: 8.4 g/dL — ABNORMAL HIGH (ref 6.5–8.1)

## 2023-03-01 LAB — CBC
HCT: 38.4 % — ABNORMAL LOW (ref 39.0–52.0)
Hemoglobin: 12.6 g/dL — ABNORMAL LOW (ref 13.0–17.0)
MCH: 30.1 pg (ref 26.0–34.0)
MCHC: 32.8 g/dL (ref 30.0–36.0)
MCV: 91.9 fL (ref 80.0–100.0)
Platelets: 369 10*3/uL (ref 150–400)
RBC: 4.18 MIL/uL — ABNORMAL LOW (ref 4.22–5.81)
RDW: 12.5 % (ref 11.5–15.5)
WBC: 17.8 10*3/uL — ABNORMAL HIGH (ref 4.0–10.5)
nRBC: 0 % (ref 0.0–0.2)

## 2023-03-01 LAB — BASIC METABOLIC PANEL
Anion gap: 11 (ref 5–15)
BUN: 17 mg/dL (ref 8–23)
CO2: 20 mmol/L — ABNORMAL LOW (ref 22–32)
Calcium: 9.1 mg/dL (ref 8.9–10.3)
Chloride: 99 mmol/L (ref 98–111)
Creatinine, Ser: 0.98 mg/dL (ref 0.61–1.24)
GFR, Estimated: >60 mL/min (ref >60)
Glucose, Bld: 134 mg/dL — ABNORMAL HIGH (ref 70–99)
Potassium: 4.6 mmol/L (ref 3.5–5.1)
Sodium: 130 mmol/L — ABNORMAL LOW (ref 135–145)

## 2023-03-01 LAB — LACTIC ACID, PLASMA: Lactic Acid, Venous: 1.6 mmol/L (ref 0.5–1.9)

## 2023-03-01 LAB — GLUCOSE, CAPILLARY: Glucose-Capillary: 184 mg/dL — ABNORMAL HIGH (ref 70–99)

## 2023-03-01 MED ORDER — BISACODYL 5 MG PO TBEC
5.0000 mg | DELAYED_RELEASE_TABLET | Freq: Every day | ORAL | Status: DC | PRN
  Administered 2023-03-03: 5 mg via ORAL
  Filled 2023-03-01: qty 1

## 2023-03-01 MED ORDER — SODIUM CHLORIDE 0.9 % IV SOLN: INTRAVENOUS | Status: DC

## 2023-03-01 MED ORDER — HYDROCODONE-ACETAMINOPHEN 5-325 MG PO TABS
1.0000 | ORAL_TABLET | ORAL | Status: DC | PRN
  Administered 2023-03-02 – 2023-03-07 (×7): 2 via ORAL
  Filled 2023-03-01 (×8): qty 2

## 2023-03-01 MED ORDER — HYDRALAZINE HCL 20 MG/ML IJ SOLN: 5.0000 mg | Freq: Four times a day (QID) | INTRAMUSCULAR | Status: DC | PRN

## 2023-03-01 MED ORDER — PIPERACILLIN-TAZOBACTAM 3.375 G IVPB 30 MIN: 3.3750 g | Freq: Once | INTRAVENOUS | Status: DC

## 2023-03-01 MED ORDER — ACETAMINOPHEN 650 MG RE SUPP: 650.0000 mg | Freq: Four times a day (QID) | RECTAL | Status: DC | PRN

## 2023-03-01 MED ORDER — SODIUM CHLORIDE 0.9% FLUSH
3.0000 mL | Freq: Two times a day (BID) | INTRAVENOUS | Status: DC
  Administered 2023-03-02 – 2023-03-07 (×8): 3 mL via INTRAVENOUS

## 2023-03-01 MED ORDER — ENOXAPARIN SODIUM 40 MG/0.4ML IJ SOSY: 40.0000 mg | PREFILLED_SYRINGE | INTRAMUSCULAR | Status: DC

## 2023-03-01 MED ORDER — IOHEXOL 300 MG/ML  SOLN
100.0000 mL | Freq: Once | INTRAMUSCULAR | Status: AC | PRN
  Administered 2023-03-01: 100 mL via INTRAVENOUS

## 2023-03-01 MED ORDER — SENNOSIDES-DOCUSATE SODIUM 8.6-50 MG PO TABS: 1.0000 | ORAL_TABLET | Freq: Every evening | ORAL | Status: DC | PRN

## 2023-03-01 MED ORDER — INSULIN ASPART 100 UNIT/ML IJ SOLN
0.0000 [IU] | Freq: Every day | INTRAMUSCULAR | Status: DC
  Administered 2023-03-03: 3 [IU] via SUBCUTANEOUS
  Administered 2023-03-05: 2 [IU] via SUBCUTANEOUS
  Filled 2023-03-01 (×2): qty 1

## 2023-03-01 MED ORDER — PIPERACILLIN-TAZOBACTAM 3.375 G IVPB
3.3750 g | Freq: Three times a day (TID) | INTRAVENOUS | Status: DC
  Administered 2023-03-01 – 2023-03-05 (×10): 3.375 g via INTRAVENOUS
  Filled 2023-03-01 (×10): qty 50

## 2023-03-01 MED ORDER — TRAZODONE HCL 50 MG PO TABS
25.0000 mg | ORAL_TABLET | Freq: Every evening | ORAL | Status: DC | PRN
  Administered 2023-03-02: 25 mg via ORAL
  Filled 2023-03-01: qty 1

## 2023-03-01 MED ORDER — VANCOMYCIN HCL IN DEXTROSE 1-5 GM/200ML-% IV SOLN: 1000.0000 mg | Freq: Once | INTRAVENOUS | Status: DC

## 2023-03-01 MED ORDER — VANCOMYCIN HCL 1750 MG/350ML IV SOLN
1750.0000 mg | Freq: Once | INTRAVENOUS | Status: AC
  Administered 2023-03-01: 1750 mg via INTRAVENOUS
  Filled 2023-03-01 (×2): qty 350

## 2023-03-01 MED ORDER — VANCOMYCIN HCL IN DEXTROSE 1-5 GM/200ML-% IV SOLN
1000.0000 mg | Freq: Two times a day (BID) | INTRAVENOUS | Status: DC
  Administered 2023-03-02 – 2023-03-04 (×6): 1000 mg via INTRAVENOUS
  Filled 2023-03-01 (×7): qty 200

## 2023-03-01 MED ORDER — ONDANSETRON HCL 4 MG/2ML IJ SOLN: 4.0000 mg | Freq: Four times a day (QID) | INTRAMUSCULAR | Status: DC | PRN

## 2023-03-01 MED ORDER — ACETAMINOPHEN 325 MG PO TABS: 650.0000 mg | ORAL_TABLET | Freq: Four times a day (QID) | ORAL | Status: DC | PRN

## 2023-03-01 MED ORDER — INSULIN ASPART 100 UNIT/ML IJ SOLN
0.0000 [IU] | Freq: Three times a day (TID) | INTRAMUSCULAR | Status: DC
  Administered 2023-03-02: 2 [IU] via SUBCUTANEOUS
  Administered 2023-03-03 – 2023-03-04 (×3): 3 [IU] via SUBCUTANEOUS
  Administered 2023-03-04 – 2023-03-05 (×2): 5 [IU] via SUBCUTANEOUS
  Administered 2023-03-05: 2 [IU] via SUBCUTANEOUS
  Administered 2023-03-06: 3 [IU] via SUBCUTANEOUS
  Administered 2023-03-06: 5 [IU] via SUBCUTANEOUS
  Administered 2023-03-07: 3 [IU] via SUBCUTANEOUS
  Filled 2023-03-01 (×10): qty 1

## 2023-03-01 MED ORDER — MORPHINE SULFATE (PF) 2 MG/ML IV SOLN
1.0000 mg | Freq: Four times a day (QID) | INTRAVENOUS | Status: DC | PRN
  Administered 2023-03-01 – 2023-03-03 (×3): 1 mg via INTRAVENOUS
  Filled 2023-03-01 (×4): qty 1

## 2023-03-01 MED ORDER — ONDANSETRON HCL 4 MG PO TABS: 4.0000 mg | ORAL_TABLET | Freq: Four times a day (QID) | ORAL | Status: DC | PRN

## 2023-03-01 NOTE — ED Provider Notes (Signed)
[facility] Provider Note    Event Date/Time   First MD Initiated Contact with Patient 03/01/23 2039     (approximate)   History   Chief Complaint Abscess   HPI  Colin Griffin is a 72 y.o. male with past medical history of diabetes who presents to the ED complaining of abscess.  Patient reports that he initially developed dysuria about 1 month ago, was diagnosed with UTI and completed a course of Bactrim.  He continued to have dysuria since then and developed urinary retention on a follow-up ED visit.  Foley catheter was placed and he followed up with urology earlier today, at which point a CT scan was ordered.  He was found to have small abscess in the area of his prostate with larger fluid collection in the left ischial rectal fossa.  He was referred to the ED for admission for IV antibiotics and drainage of abscess by IR.  Patient denies any fevers or abdominal pain, primarily complains of pain around his left buttock.  He denies any difficulties with his Foley catheter.     Physical Exam   Triage Vital Signs: ED Triage Vitals [03/01/23 1856]  Enc Vitals Group     BP 136/60     Pulse Rate 92     Resp 18     Temp 98.5 F (36.9 C)     Temp Source Oral     SpO2 97 %     Weight 175 lb (79.4 kg)     Height 6\' 1"  (1.854 m)     Head Circumference      Peak Flow      Pain Score 8     Pain Loc      Pain Edu?      Excl. in [place]?     Most recent vital signs: Vitals:   03/01/23 1856  BP: 136/60  Pulse: 92  Resp: 18  Temp: 98.5 F (36.9 C)  SpO2: 97%    Constitutional: Alert and oriented. Eyes: Conjunctivae are normal. Head: Atraumatic. Nose: No congestion/rhinnorhea. Mouth/Throat: Mucous membranes are moist.  Cardiovascular: Normal rate, regular rhythm. Grossly normal heart sounds.  2+ radial pulses bilaterally. Respiratory: Normal respiratory effort.  No retractions. Lungs CTAB. Gastrointestinal: Soft and nontender. No distention.   Induration and tenderness noted over the medial portion of left buttock. Musculoskeletal: No lower extremity tenderness nor edema.  Neurologic:  Normal speech and language. No gross focal neurologic deficits are appreciated.    ED Results / Procedures / Treatments   Labs (all labs ordered are listed, but only abnormal results are displayed) Labs Reviewed  BASIC METABOLIC PANEL - Abnormal; Notable for the following components:      Result Value   Sodium 130 (*)    CO2 20 (*)    Glucose, Bld 134 (*)    All other components within normal limits  CBC - Abnormal; Notable for the following components:   WBC 17.8 (*)    RBC 4.18 (*)    Hemoglobin 12.6 (*)    HCT 38.4 (*)    All other components within normal limits  HEPATIC FUNCTION PANEL - Abnormal; Notable for the following components:   Total Protein 8.4 (*)    Albumin 3.2 (*)    ALT 48 (*)    Alkaline Phosphatase 186 (*)    All other components within normal limits  CULTURE, BLOOD (ROUTINE X 2)  CULTURE, BLOOD (ROUTINE X 2)  LACTIC ACID, PLASMA  LACTIC  ACID, PLASMA    PROCEDURES:  Critical Care performed: No  Procedures   MEDICATIONS ORDERED IN ED: Medications  piperacillin-tazobactam (ZOSYN) IVPB 3.375 g (has no administration in time range)  vancomycin (VANCOCIN) IVPB 1000 mg/200 mL premix (has no administration in time range)     IMPRESSION / MDM / ASSESSMENT AND PLAN / ED COURSE  I reviewed the triage vital signs and the nursing notes.                              72 y.o. male with past medical history of diabetes who presents to the ED complaining of increasing pain and swelling in his left posterior pelvic area, found to have large abscess on outpatient CT imaging.  Patient's presentation is most consistent with acute presentation with potential threat to life or bodily function.  Differential diagnosis includes, but is not limited to, rectal abscess, pelvic abscess, prostatitis, UTI.  Patient  nontoxic-appearing and in no acute distress, vital signs are reassuring and do not appear concerning for sepsis.  CT imaging from earlier today was reviewed and shows fluid collection in the area of the prostate with larger fluid collection in the ischial rectal fossa on the left.  We will start patient on Zosyn and vancomycin, urine culture previously grew MSSA.  Labs thus far remarkable for leukocytosis with no significant anemia, electrolyte abnormality, or AKI.  We will check blood cultures and lactic acid, plan to discuss with general surgery and admit to the hospitalist service.  Case discussed with Dr. Christian Mate of general surgery, who will follow during admission.  Case discussed with hospitalist for admission.      FINAL CLINICAL IMPRESSION(S) / ED DIAGNOSES   Final diagnoses:  Pelvic abscess in male  Prostatitis, unspecified prostatitis type     Rx / DC Orders   ED Discharge Orders     None        Note:  This document was prepared using Dragon voice recognition software and may include unintentional dictation errors.   Blake Divine, MD 03/01/23 2139

## 2023-03-01 NOTE — Progress Notes (Signed)
Pharmacy Antibiotic Note  Colin Griffin is a 72 y.o. male admitted on 03/01/2023 with pelvic abscess.  Pharmacy has been consulted for Zosyn & Vancomycin dosing.  Plan: Zosyn 3.375g IV q8h (4 hour infusion).  Pt given Vancomycin 1750 mg once. Vancomycin 1000 mg IV Q 12 hrs. Goal AUC 400-550. Expected AUC: 494.7 SCr used: 0.98, TBW 79.4 kg < IBW 79.9 kg  Pharmacy will continue to follow and will adjust abx dosing whenever warranted.  Temp (24hrs), Avg:98.5 F (36.9 C), Min:98.5 F (36.9 C), Max:98.5 F (36.9 C)   Recent Labs  Lab 03/01/23 1858  WBC 17.8*  CREATININE 0.98    Estimated Creatinine Clearance: 79.9 mL/min (by C-G formula based on SCr of 0.98 mg/dL).    No Known Allergies  Antimicrobials this admission: 4/3 Zosyn >>  4/3 Vancomycin >>  Microbiology results: 4/03 BCx: Pending  No lab cx currently ordered or pending at this time.  Thank you for allowing pharmacy to be a part of this patient's care.  Renda Rolls, PharmD, [facility] 03/01/2023 10:02 PM

## 2023-03-01 NOTE — Plan of Care (Signed)
  Problem: Education: Goal: Knowledge of General Education information will improve Description: Including pain rating scale, medication(s)/side effects and non-pharmacologic comfort measures 03/01/2023 2304 by Santa Lighter, RN Outcome: Progressing 03/01/2023 2303 by Santa Lighter, RN Outcome: Progressing   Problem: Health Behavior/Discharge Planning: Goal: Ability to manage health-related needs will improve 03/01/2023 2304 by Santa Lighter, RN Outcome: Progressing 03/01/2023 2303 by Santa Lighter, RN Outcome: Progressing

## 2023-03-01 NOTE — H&P (Incomplete)
History and Physical   [facility] - [place] @ [facility] Admission History and Physical [employer], D.O.    Patient Name: Colin Griffin MR#: [id number] Date of Birth: May 29, 1951 Date of Admission: 03/01/2023  Referring MD/NP/PA: Dr. Charna Archer Primary Care Physician: [name], MD  Chief Complaint:  Chief Complaint  Patient presents with   Abscess    HPI: Colin Griffin is a 72 y.o. male with a known history of diabetes diagnosed 1 month ago presents to the emergency department for evaluation of pelvic abscess.  Patient was in a usual state of health until 1 month ago he reports that he was diagnosed with a urinary tract infection for which she completed a course of Bactrim.  He was seen in the emergency department about 10 days ago for urinary retention and Foley catheter was placed.  He followed up with urology today for a CT scan which showed a small abscess in the prostate and a larger pelvic abscess.  He was referred by urology to the emergency department for admission for IR drainage of the abscess as well as IV antibiotics.  Of note he reports 25 pound weight loss in the past 2 months since being diagnosed with diabetes.  He has made significant changes to his diet.  Patient is functionally independent with his activities of daily living  Patient denies fevers/chills, weakness, dizziness, chest pain, shortness of breath, N/V/C/D, abdominal pain, dysuria/frequency, changes in mental status.    Otherwise there has been no change in status. Patient has been taking medication as prescribed and there has been no recent change in medication or diet.  No recent antibiotics.  There has been no recent illness, hospitalizations, travel or sick contacts.    EMS/ED Course: Patient received Zosyn and Vanco. Medical admission has been requested for further management of prostatitis, pelvic abscess.  Review of Systems:  CONSTITUTIONAL: No fever/chills, fatigue, weakness,  weight gain/loss, headache. EYES: No blurry or double vision. ENT: No tinnitus, postnasal drip, redness or soreness of the oropharynx. RESPIRATORY: No cough, dyspnea, wheeze.  No hemoptysis.  CARDIOVASCULAR: No chest pain, palpitations, syncope, orthopnea. No lower extremity edema.  GASTROINTESTINAL: No nausea, vomiting, abdominal pain, diarrhea, constipation.  No hematemesis, melena or hematochezia. GENITOURINARY: No dysuria ENDOCRINE: No polyuria or nocturia. No heat or cold intolerance. HEMATOLOGY: No anemia, bruising, bleeding. INTEGUMENTARY: No rashes, ulcers, lesions. MUSCULOSKELETAL: No arthritis, gout. NEUROLOGIC: No numbness, tingling, ataxia, seizure-type activity, weakness. PSYCHIATRIC: No anxiety, depression, insomnia.   Past Medical History:  Diagnosis Date   Pre-diabetes     Past Surgical History:  Procedure Laterality Date   APPENDECTOMY       reports that he has been smoking cigarettes. He has a 60.00 pack-year smoking history. He has never used smokeless tobacco. He reports that he does not drink alcohol and does not use drugs.  No Known Allergies  Family History  Problem Relation Age of Onset   Atrial fibrillation Mother    Cancer Maternal Grandmother    Diabetes Other     Prior to Admission medications   Medication Sig Start Date End Date Taking? Authorizing Provider  Dulaglutide (TRULICITY) A999333 0000000 SOPN Inject 0.75 mg into the skin once a week. 02/20/23   Simmons-Robinson, Riki Sheer, MD  metFORMIN (GLUCOPHAGE-XR) 500 MG 24 hr tablet Take 1 tablet (500 mg total) by mouth daily with breakfast. 02/15/23   Simmons-Robinson, Makiera, MD  solifenacin (VESICARE) 5 MG tablet Take 1 tablet (5 mg total) by mouth daily. 02/22/23   Gwyneth Sprout,  FNP  sulfamethoxazole-trimethoprim (BACTRIM DS) 800-160 MG tablet Take 1 tablet by mouth 2 (two) times daily for 10 days. 02/20/23 03/02/23  Simmons-Robinson, Riki Sheer, MD  tamsulosin (FLOMAX) 0.4 MG CAPS capsule Take 1  capsule (0.4 mg total) by mouth 2 (two) times daily after a meal. 02/22/23   Gwyneth Sprout, FNP  traMADol (ULTRAM) 50 MG tablet Take 1 tablet (50 mg total) by mouth every 8 (eight) hours as needed for up to 3 days. 02/27/23 03/02/23  Simmons-Robinson, Riki Sheer, MD    Physical Exam: Vitals:   03/01/23 1856  BP: 136/60  Pulse: 92  Resp: 18  Temp: 98.5 F (36.9 C)  TempSrc: Oral  SpO2: 97%  Weight: 79.4 kg  Height: 6\' 1"  (1.854 m)    GENERAL: 72 y.o.-year-old white male patient, well-developed, well-nourished lying in the bed in no acute distress.  Pleasant and cooperative.   HEENT: Head atraumatic, normocephalic. Pupils equal. Mucus membranes moist. NECK: Supple. No JVD. CHEST: Normal breath sounds bilaterally. No wheezing, rales, rhonchi or crackles. No use of accessory muscles of respiration.  No reproducible chest wall tenderness.  CARDIOVASCULAR: S1, S2 normal. No murmurs, rubs, or gallops. Cap refill <2 seconds. Pulses intact distally.  ABDOMEN: Soft, nondistended, nontender. No rebound, guarding, rigidity. Normoactive bowel sounds present in all four quadrants.  GU: Foley in place and draining well EXTREMITIES: No pedal edema, cyanosis, or clubbing. No calf tenderness or Homan's sign.  NEUROLOGIC: The patient is alert and oriented x 3. Cranial nerves II through XII are grossly intact with no focal sensorimotor deficit. PSYCHIATRIC:  Normal affect, mood, thought content. SKIN: Warm, dry, and intact without obvious rash, lesion, or ulcer.    Labs on Admission:  CBC: Recent Labs  Lab 03/01/23 1858  WBC 17.8*  HGB 12.6*  HCT 38.4*  MCV 91.9  PLT 0000000   Basic Metabolic Panel: Recent Labs  Lab 03/01/23 1858  NA 130*  K 4.6  CL 99  CO2 20*  GLUCOSE 134*  BUN 17  CREATININE 0.98  CALCIUM 9.1   GFR: Estimated Creatinine Clearance: 79.9 mL/min (by C-G formula based on SCr of 0.98 mg/dL). Liver Function Tests: Recent Labs  Lab 03/01/23 1954  AST 40  ALT 48*  ALKPHOS  186*  BILITOT 0.8  PROT 8.4*  ALBUMIN 3.2*   No results for input(s): "LIPASE", "AMYLASE" in the last 168 hours. No results for input(s): "AMMONIA" in the last 168 hours. Coagulation Profile: No results for input(s): "INR", "PROTIME" in the last 168 hours. Cardiac Enzymes: No results for input(s): "CKTOTAL", "CKMB", "CKMBINDEX", "TROPONINI" in the last 168 hours. BNP (last 3 results) No results for input(s): "PROBNP" in the last 8760 hours. HbA1C: No results for input(s): "HGBA1C" in the last 72 hours. CBG: No results for input(s): "GLUCAP" in the last 168 hours. Lipid Profile: No results for input(s): "CHOL", "HDL", "LDLCALC", "TRIG", "CHOLHDL", "LDLDIRECT" in the last 72 hours. Thyroid Function Tests: No results for input(s): "TSH", "T4TOTAL", "FREET4", "T3FREE", "THYROIDAB" in the last 72 hours. Anemia Panel: No results for input(s): "VITAMINB12", "FOLATE", "FERRITIN", "TIBC", "IRON", "RETICCTPCT" in the last 72 hours. Urine analysis:    Component Value Date/Time   COLORURINE YELLOW (A) 02/20/2023 1629   APPEARANCEUR CLEAR (A) 02/20/2023 1629   LABSPEC 1.008 02/20/2023 1629   PHURINE 6.0 02/20/2023 1629   GLUCOSEU 150 (A) 02/20/2023 1629   HGBUR NEGATIVE 02/20/2023 1629   BILIRUBINUR NEGATIVE 02/20/2023 1629   BILIRUBINUR Negative 02/14/2023 1437   KETONESUR 5 (A) 02/20/2023 1629  PROTEINUR NEGATIVE 02/20/2023 1629   UROBILINOGEN 0.2 02/14/2023 1437   NITRITE NEGATIVE 02/20/2023 1629   LEUKOCYTESUR NEGATIVE 02/20/2023 1629   Sepsis Labs: @LABRCNTIP (procalcitonin:4,lacticidven:4) ) Recent Results (from the past 240 hour(s))  Urine Culture (for pregnant, neutropenic or urologic patients or patients with an indwelling urinary catheter)     Status: None   Collection Time: 02/20/23  4:29 PM   Specimen: Urine, Clean Catch  Result Value Ref Range Status   Specimen Description   Final    URINE, CLEAN CATCH Performed at [facility], [address],  [place], [place] [address]    Special Requests   Final    NONE Performed at [facility], [address], [place], [place] [address]    Culture   Final    NO GROWTH Performed at [facility], [place] [address], [place],  [address]    Report Status 02/22/2023 FINAL  Final     Radiological Exams on Admission: DG Chest Port 1 View  Result Date: 03/01/2023 CLINICAL DATA:  Pelvic abscess EXAM: PORTABLE CHEST 1 VIEW COMPARISON:  CT done earlier today FINDINGS: The heart size and mediastinal contours are within normal limits. Both lungs are clear. The visualized skeletal structures are unremarkable. IMPRESSION: No active disease. Electronically Signed   By: Elmer Picker M.D.   On: 03/01/2023 19:45   CT Abdomen Pelvis W Contrast  Result Date: 03/01/2023 CLINICAL DATA:  Left-sided pelvic mass. EXAM: CT ABDOMEN AND PELVIS WITH CONTRAST TECHNIQUE: Multidetector CT imaging of the abdomen and pelvis was performed using the standard protocol following bolus administration of intravenous contrast. RADIATION DOSE REDUCTION: This exam was performed according to the departmental dose-optimization program which includes automated exposure control, adjustment of the mA and/or kV according to patient size and/or use of iterative reconstruction technique. CONTRAST:  127mL OMNIPAQUE IOHEXOL 300 MG/ML  SOLN COMPARISON:  None Available. FINDINGS: Lower chest: The lung bases are clear of an acute process. No pulmonary lesions or pleural effusions. Age advanced atherosclerotic calcifications involving the aorta and coronary arteries. No pericardial effusion. Hepatobiliary: No hepatic lesions or intrahepatic biliary dilatation. The gallbladder contains large rim calcified gallstones but no findings for acute cholecystitis. Normal caliber common bile duct. Pancreas: No mass, inflammation or ductal dilatation. Spleen: Normal size.  No focal lesions. Adrenals/Urinary Tract: The adrenal glands and kidneys are  unremarkable. No renal lesions, renal calculi or hydronephrosis. The delayed images do not demonstrate any significant collecting system abnormalities. There is a Foley catheter noted in the bladder. Stomach/Bowel: The stomach, duodenum, small bowel and colon are. No acute inflammatory process, mass lesions or obstructive findings. There is a large amount of stool throughout colon and down into the rectosigmoid area suggesting constipation. Vascular/Lymphatic: Age advanced atherosclerotic calcifications involving the aorta and iliac arteries and branch vessels but no aneurysm or dissection. The major venous structures are patent. No mesenteric or retroperitoneal mass or adenopathy. Small scattered lymph nodes are noted. 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 There is also an adjacent large, 6.8 x 5.3 cm abscess in the left ischial rectal fossa. Ice in this broke out from the prostate gland. There is significant mass effect the rectum which is displaced to the right. I do not see any direct involvement of the rectum. Other: No pelvic mass or adenopathy. No free pelvic fluid collections. No inguinal mass or adenopathy. No abdominal wall hernia or subcutaneous lesions. Musculoskeletal: No significant bony findings. IMPRESSION: 1.  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 There is also an adjacent large, 6.8 x 5.3 cm abscess in the left ischial rectal fossa. 2. Cholelithiasis. 3. Age advanced atherosclerotic calcifications involving the aorta and iliac arteries and branch vessels. 4. Large amount of stool throughout the colon and down into the rectosigmoid area suggesting constipation. 5. Aortic atherosclerosis. Aortic Atherosclerosis (ICD10-I70.0). Electronically Signed   By: Marijo Sanes M.D.   On: 03/01/2023 15:37    EKG: Pending Assessment/Plan  This is a 72  y.o. male with a history of diabetes now being admitted with:  #. Pelvic abscess, prostatitis -Admit to inpatient - Continue Zosyn and Vanco - Follow-up urine and blood cultures - Pain control - Urology, general surgery have both been consulted and will be following - Will need to involve interventional radiology for consideration of drainage in the morning. -Hold Vesicare and Flomax for now - Check PT/INR and EKG for preop  #. H/o Diabetes - Accuchecks q4h with RISS coverage - NPO after midnight for possible surgical intervention -Hold metformin  Admission status: Inpatient IV Fluids: Normal saline Diet/Nutrition: N.p.o. after midnight Consults called: Urology, general surgery and IR DVT Px: Lovenox, SCDs and early ambulation. Code Status: Full Code  Disposition Plan: To home in 1-2 days  All the records are reviewed and case discussed with ED provider. Management plans discussed with the patient and/or family who express understanding and agree with plan of care.  Zyla Dascenzo D.O. on 03/01/2023 at 9:38 PM CC: Primary care physician; [name], MD   03/01/2023, 9:38 PM

## 2023-03-01 NOTE — Progress Notes (Signed)
I, DeAsia L Maxie,acting as a scribe for Abbie Sons, MD.,have documented all relevant documentation on the behalf of Abbie Sons, MD,as directed by  Abbie Sons, MD while in the presence of Abbie Sons, MD.   03/01/23 2:15 PM   Colin Griffin 1950-12-04 [id number]  Referring provider: [name], MD [address] [place],  [place] [address]  Chief Complaint  Patient presents with   Other    HPI: Colin Griffin is a 72 y.o. male who is referred for dysuria.  His appointment was initially on 3/19 however, he reported to the [facility] ED 02/20/23 complaining of a 6 weeks history of dysuria. A urinalysis on 3/19 showed > 30 WBC and urine culture was positive for Staph aureus. He was treated with Bactrim and Macrobid x 7 days. He was found to be in urinary retention on ED visit 3/25 and a foley catheter was placed.  He was started on Tamsulosin and Vesicare 02/22/23 He has experienced a significant increase in the size of a left gluteal mass over the last two weeks, causing severe discomfort and limiting his ability to sit or stand comfortably. Denies fever, chills   PMH: Past Medical History:  Diagnosis Date   Pre-diabetes     Surgical History: Past Surgical History:  Procedure Laterality Date   APPENDECTOMY      Home Medications:  Allergies as of 03/01/2023   No Known Allergies      Medication List        Accurate as of March 01, 2023  2:15 PM. If you have any questions, ask your nurse or doctor.          metFORMIN 500 MG 24 hr tablet Commonly known as: GLUCOPHAGE-XR Take 1 tablet (500 mg total) by mouth daily with breakfast.   solifenacin 5 MG tablet Commonly known as: VESICARE Take 1 tablet (5 mg total) by mouth daily.   sulfamethoxazole-trimethoprim 800-160 MG tablet Commonly known as: BACTRIM DS Take 1 tablet by mouth 2 (two) times daily for 10 days.   tamsulosin 0.4 MG Caps capsule Commonly known as:  FLOMAX Take 1 capsule (0.4 mg total) by mouth 2 (two) times daily after a meal.   traMADol 50 MG tablet Commonly known as: ULTRAM Take 1 tablet (50 mg total) by mouth every 8 (eight) hours as needed for up to 3 days.   Trulicity [id number] 0000000 Sopn Generic drug: Dulaglutide Inject 0.75 mg into the skin once a week.        Allergies: No Known Allergies  Family History: Family History  Problem Relation Age of Onset   Atrial fibrillation Mother    Cancer Maternal Grandmother    Diabetes Other     Social History:  reports that he has been smoking cigarettes. He has a 60.00 pack-year smoking history. He has never used smokeless tobacco. He reports that he does not drink alcohol and does not use drugs.   Physical Exam: BP 124/68   Pulse 93   Ht 6' (1.829 m)   Wt 175 lb (79.4 kg)   BMI 23.73 kg/m   Constitutional:  Alert and oriented, No acute distress. HEENT: [place] AT Respiratory: Normal respiratory effort, no increased work of breathing. GI: Abdomen is soft, nontender, nondistended, no abdominal masses GU: a large firm left gluteal mass. Prostate 30 grams smooth without nodules, induration or tenderness.  Large mass palpated is palpated on the left pelvic side wall rectally Skin: No rashes, bruises or  suspicious lesions. Neurologic: Grossly intact, no focal deficits, moving all 4 extremities. Psychiatric: Normal mood and affect.  Laboratory Data: Lab Results  Component Value Date   WBC 17.1 (H) 02/20/2023   HGB 12.9 (L) 02/20/2023   HCT 38.6 (L) 02/20/2023   MCV 91.3 02/20/2023   PLT 331 02/20/2023    Lab Results  Component Value Date   CREATININE 0.82 02/20/2023    Lab Results  Component Value Date   HGBA1C 11.1 (H) 02/14/2023      Assessment & Plan:    Pelvic mass Significant firm mass palpated left gluteus which is also palpable on the left pelvic side wall. This has significantly increased in size in the last two weeks according to the patient. STAT CT  abdomen/pelvis and we will notify patient with results Rx hydrocodone sent to pharmacy  Urinary retention He has been on Tamsulosin Discontinue Solifenacin It appears the pelvic mass separate from the GU system and if CT confirms will DC catheter for voiding trial He did have a staph aureus UTI which may have been prostatitis and could have precipitated the urinary retention.  Addendum: CT performed today showed rim-enhancing fluid collections adjacent to the prostatic urethra bilaterally consistent with abscess.  There was also a 6.8 x 5.3 abscess of the left ischiorectal fossa with significant mass effect on the rectum  I contacted the patient and discussed the CT findings.  I recommended that he proceed to the ED for direct admission to the hospital service.  The pelvic abscess should be able to be drained by IR however would recommend a general surgery opinion.  The prostatic abscess can be treated transurethrally however would recommend drainage of the pelvic abscess first.  I will see the patient tomorrow and further discuss transurethral resection for drainage of the prostatic abscess.  I contacted the ED and spoke to the triage nurse regarding my recommendations.  I have reviewed the above documentation for accuracy and completeness, and I agree with the above.   Abbie Sons, [place] [address] [place], [place] [address] [phone]

## 2023-03-01 NOTE — Progress Notes (Signed)
Patient arrived to room. VSS. Foley leg bag changed to a standard bag. Assessment completed. Call bell within reach. Pain medication given and patient resting comfortably.

## 2023-03-01 NOTE — ED Triage Notes (Signed)
Pt ambulatory to triage.  Pt sent from [facility] .  Pt had ct scan today with large pelvis abcsess.   Pt sent for admission and iv abx.  Pt alert.

## 2023-03-01 NOTE — Plan of Care (Signed)
  Problem: Education: Goal: Knowledge of General Education information will improve Description Including pain rating scale, medication(s)/side effects and non-pharmacologic comfort measures Outcome: Progressing   Problem: Health Behavior/Discharge Planning: Goal: Ability to manage health-related needs will improve Outcome: Progressing   

## 2023-03-01 NOTE — ED Triage Notes (Signed)
First nurse note: Pt sent by PCP for admission. Pt had CT scan done showing large pelvic abscess about 6cm. Pt sent for IV antibiotics and possible IR drainage.

## 2023-03-02 ENCOUNTER — Encounter: Payer: Self-pay | Admitting: Family Medicine

## 2023-03-02 ENCOUNTER — Inpatient Hospital Stay: Payer: Medicare PPO

## 2023-03-02 DIAGNOSIS — N412 Abscess of prostate: Secondary | ICD-10-CM

## 2023-03-02 DIAGNOSIS — K651 Peritoneal abscess: Secondary | ICD-10-CM | POA: Diagnosis not present

## 2023-03-02 LAB — COMPREHENSIVE METABOLIC PANEL
ALT: 48 U/L — ABNORMAL HIGH (ref 0–44)
AST: 36 U/L (ref 15–41)
Albumin: 2.6 g/dL — ABNORMAL LOW (ref 3.5–5.0)
Alkaline Phosphatase: 170 U/L — ABNORMAL HIGH (ref 38–126)
Anion gap: 7 (ref 5–15)
BUN: 18 mg/dL (ref 8–23)
CO2: 21 mmol/L — ABNORMAL LOW (ref 22–32)
Calcium: 8.6 mg/dL — ABNORMAL LOW (ref 8.9–10.3)
Chloride: 105 mmol/L (ref 98–111)
Creatinine, Ser: 0.9 mg/dL (ref 0.61–1.24)
GFR, Estimated: >60 mL/min (ref >60)
Glucose, Bld: 146 mg/dL — ABNORMAL HIGH (ref 70–99)
Potassium: 3.8 mmol/L (ref 3.5–5.1)
Sodium: 133 mmol/L — ABNORMAL LOW (ref 135–145)
Total Bilirubin: 0.6 mg/dL (ref 0.3–1.2)
Total Protein: 7.2 g/dL (ref 6.5–8.1)

## 2023-03-02 LAB — CBC
HCT: 34.9 % — ABNORMAL LOW (ref 39.0–52.0)
Hemoglobin: 11.5 g/dL — ABNORMAL LOW (ref 13.0–17.0)
MCH: 30.1 pg (ref 26.0–34.0)
MCHC: 33 g/dL (ref 30.0–36.0)
MCV: 91.4 fL (ref 80.0–100.0)
Platelets: 341 10*3/uL (ref 150–400)
RBC: 3.82 MIL/uL — ABNORMAL LOW (ref 4.22–5.81)
RDW: 12.5 % (ref 11.5–15.5)
WBC: 16.4 10*3/uL — ABNORMAL HIGH (ref 4.0–10.5)
nRBC: 0 % (ref 0.0–0.2)

## 2023-03-02 LAB — PROTIME-INR
INR: 1.2 (ref 0.8–1.2)
Prothrombin Time: 15.1 seconds (ref 11.4–15.2)

## 2023-03-02 LAB — HIV ANTIBODY (ROUTINE TESTING W REFLEX): HIV Screen 4th Generation wRfx: NONREACTIVE

## 2023-03-02 LAB — GLUCOSE, CAPILLARY
Glucose-Capillary: 144 mg/dL — ABNORMAL HIGH (ref 70–99)
Glucose-Capillary: 211 mg/dL — ABNORMAL HIGH (ref 70–99)

## 2023-03-02 LAB — LACTIC ACID, PLASMA: Lactic Acid, Venous: 1.1 mmol/L (ref 0.5–1.9)

## 2023-03-02 MED ORDER — FENTANYL CITRATE (PF) 100 MCG/2ML IJ SOLN
INTRAMUSCULAR | Status: AC
  Administered 2023-03-03: 50 ug via INTRAVENOUS
  Filled 2023-03-02: qty 2

## 2023-03-02 MED ORDER — LIDOCAINE HCL (PF) 1 % IJ SOLN
10.0000 mL | Freq: Once | INTRAMUSCULAR | Status: AC
  Administered 2023-03-02: 10 mL

## 2023-03-02 MED ORDER — ENOXAPARIN SODIUM 40 MG/0.4ML IJ SOSY: 40.0000 mg | PREFILLED_SYRINGE | INTRAMUSCULAR | Status: DC

## 2023-03-02 MED ORDER — CHLORHEXIDINE GLUCONATE CLOTH 2 % EX PADS
6.0000 | MEDICATED_PAD | Freq: Every day | CUTANEOUS | Status: DC
  Administered 2023-03-02 – 2023-03-06 (×5): 6 via TOPICAL

## 2023-03-02 MED ORDER — MIDAZOLAM HCL 5 MG/5ML IJ SOLN
INTRAMUSCULAR | Status: AC | PRN
  Administered 2023-03-02 (×2): 1 mg via INTRAVENOUS

## 2023-03-02 MED ORDER — FENTANYL CITRATE (PF) 100 MCG/2ML IJ SOLN
INTRAMUSCULAR | Status: AC | PRN
  Administered 2023-03-02 (×2): 50 ug via INTRAVENOUS

## 2023-03-02 MED ORDER — SODIUM CHLORIDE 0.9% FLUSH
5.0000 mL | Freq: Three times a day (TID) | INTRAVENOUS | Status: DC
  Administered 2023-03-02 – 2023-03-07 (×14): 5 mL

## 2023-03-02 MED ORDER — MIDAZOLAM HCL 2 MG/2ML IJ SOLN
INTRAMUSCULAR | Status: AC
  Filled 2023-03-02: qty 2

## 2023-03-02 MED ORDER — ENOXAPARIN SODIUM 40 MG/0.4ML IJ SOSY
40.0000 mg | PREFILLED_SYRINGE | INTRAMUSCULAR | Status: DC
  Administered 2023-03-04 – 2023-03-07 (×4): 40 mg via SUBCUTANEOUS
  Filled 2023-03-02 (×4): qty 0.4

## 2023-03-02 NOTE — Progress Notes (Signed)
Urology Consult Follow Up  Subjective: Successful CT-guided drain placement by IR today No complaints and feeling better Discussed TURP/unroofing of prostatic abscess 03/03/2023  Anti-infectives: Anti-infectives (From admission, onward)    Start     Dose/Rate Route Frequency Ordered Stop   03/02/23 1000  vancomycin (VANCOCIN) IVPB 1000 mg/200 mL premix        1,000 mg 200 mL/hr over 60 Minutes Intravenous Every 12 hours 03/01/23 2206     03/01/23 2200  piperacillin-tazobactam (ZOSYN) IVPB 3.375 g        3.375 g 12.5 mL/hr over 240 Minutes Intravenous Every 8 hours 03/01/23 2154     03/01/23 2200  vancomycin (VANCOREADY) IVPB 1750 mg/350 mL        1,750 mg 175 mL/hr over 120 Minutes Intravenous  Once 03/01/23 2155 03/02/23 0100   03/01/23 2115  piperacillin-tazobactam (ZOSYN) IVPB 3.375 g  Status:  Discontinued        3.375 g 100 mL/hr over 30 Minutes Intravenous  Once 03/01/23 2111 03/01/23 2154   03/01/23 2115  vancomycin (VANCOCIN) IVPB 1000 mg/200 mL premix  Status:  Discontinued        1,000 mg 200 mL/hr over 60 Minutes Intravenous  Once 03/01/23 2111 03/01/23 2155       Current Facility-Administered Medications  Medication Dose Route Frequency Provider Last Rate Last Admin   0.9 %  sodium chloride infusion   Intravenous Continuous Hugelmeyer, Alexis, DO 75 mL/hr at 03/02/23 0656 Infusion Verify at 03/02/23 [id number]   acetaminophen (TYLENOL) tablet 650 mg  650 mg Oral Q6H PRN Hugelmeyer, Alexis, DO       Or   acetaminophen (TYLENOL) suppository 650 mg  650 mg Rectal Q6H PRN Hugelmeyer, Alexis, DO       bisacodyl (DULCOLAX) EC tablet 5 mg  5 mg Oral Daily PRN Hugelmeyer, Alexis, DO       Chlorhexidine Gluconate Cloth 2 % PADS 6 each  6 each Topical Q0600 Hugelmeyer, Alexis, DO   6 each at 03/02/23 0541   [START ON 03/03/2023] enoxaparin (LOVENOX) injection 40 mg  40 mg Subcutaneous Q24H Narda Rutherford T, NP       fentaNYL (SUBLIMAZE) 100 MCG/2ML injection            hydrALAZINE  (APRESOLINE) injection 5 mg  5 mg Intravenous Q6H PRN Hugelmeyer, Alexis, DO       HYDROcodone-acetaminophen (NORCO/VICODIN) 5-325 MG per tablet 1-2 tablet  1-2 tablet Oral Q4H PRN Hugelmeyer, Alexis, DO   2 tablet at 03/02/23 0116   insulin aspart (novoLOG) injection 0-15 Units  0-15 Units Subcutaneous TID WC Hugelmeyer, Alexis, DO       insulin aspart (novoLOG) injection 0-5 Units  0-5 Units Subcutaneous QHS Hugelmeyer, Alexis, DO       midazolam (VERSED) 2 MG/2ML injection            morphine (PF) 2 MG/ML injection 1 mg  1 mg Intravenous Q6H PRN Hugelmeyer, Alexis, DO   1 mg at 03/02/23 1038   ondansetron (ZOFRAN) tablet 4 mg  4 mg Oral Q6H PRN Hugelmeyer, Alexis, DO       Or   ondansetron (ZOFRAN) injection 4 mg  4 mg Intravenous Q6H PRN Hugelmeyer, Alexis, DO       piperacillin-tazobactam (ZOSYN) IVPB 3.375 g  3.375 g Intravenous Q8H Renda Rolls, RPH 12.5 mL/hr at 03/02/23 0656 Infusion Verify at 03/02/23 0656   senna-docusate (Senokot-S) tablet 1 tablet  1 tablet Oral QHS PRN Hugelmeyer, Alexis, DO  sodium chloride flush (NS) 0.9 % injection 3 mL  3 mL Intravenous Q12H Hugelmeyer, Alexis, DO       sodium chloride flush (NS) 0.9 % injection 5 mL  5 mL Intracatheter Q8H El-Abd, Joesph Fillers, MD       traZODone (DESYREL) tablet 25 mg  25 mg Oral QHS PRN Hugelmeyer, Alexis, DO       vancomycin (VANCOCIN) IVPB 1000 mg/200 mL premix  1,000 mg Intravenous Q12H Renda Rolls, RPH 200 mL/hr at 03/02/23 1028 1,000 mg at 03/02/23 1028     Objective: Vital signs in last 24 hours: Temp:  [97 F (36.1 C)-98.9 F (37.2 C)] 98.9 F (37.2 C) (04/04 1605) Pulse Rate:  [58-92] 70 (04/04 1605) Resp:  [3-22] 16 (04/04 1605) BP: (103-146)/(57-73) 113/61 (04/04 1605) SpO2:  [86 %-100 %] 97 % (04/04 1605) Weight:  [79.4 kg] 79.4 kg (04/04 1407)  Intake/Output from previous day: 04/03 0701 - 04/04 0700 In: 418.3 [I.V.:402.1; IV Piggyback:16.2] Out: 1250 [Urine:1250] Intake/Output this  shift: Total I/O In: 100 [Other:100] Out: 800 [Urine:800]   Physical Exam: Foley catheter draining clear urine Purulent fluid draining suction bulb  Lab Results:  Recent Labs    03/01/23 1858 03/02/23 0504  WBC 17.8* 16.4*  HGB 12.6* 11.5*  HCT 38.4* 34.9*  PLT 369 341   BMET Recent Labs    03/01/23 1858 03/02/23 0504  NA 130* 133*  K 4.6 3.8  CL 99 105  CO2 20* 21*  GLUCOSE 134* 146*  BUN 17 18  CREATININE 0.98 0.90  CALCIUM 9.1 8.6*   PT/INR Recent Labs    03/02/23 0504  LABPROT 15.1  INR 1.2   ABG No results for input(s): "PHART", "HCO3" in the last 72 hours.  Invalid input(s): "PCO2", "PO2"  Studies/Results: CT GUIDED VISCERAL FLUID DRAIN BY PERC CATH  Result Date: 03/02/2023 INDICATION: Pelvic abscess EXAM: CT-guided drain placement into pelvic abscess MEDICATIONS: Documented in the EMR ANESTHESIA/SEDATION: Moderate (conscious) sedation was employed during this procedure. A total of Versed 2 mg and Fentanyl 100 mcg was administered intravenously by the radiology nurse. Total intra-service moderate Sedation Time: 13 minutes. The patient's level of consciousness and vital signs were monitored continuously by radiology nursing throughout the procedure under my direct supervision. COMPLICATIONS: None immediate. PROCEDURE: Informed written consent was obtained from the patient after a thorough discussion of the procedural risks, benefits and alternatives. All questions were addressed. Maximal Sterile Barrier Technique was utilized including caps, mask, sterile gowns, sterile gloves, sterile drape, hand hygiene and skin antiseptic. A timeout was performed prior to the initiation of the procedure. The patient was placed prone on the exam table. Limited CT of the abdomen and pelvis was performed for planning purposes. This demonstrated pelvic fluid collection in the posterior pelvis. Skin entry site was marked, and the overlying skin was prepped and draped in the standard  sterile fashion. Local analgesia was obtained with 1% lidocaine. Using intermittent CT fluoroscopy, a 19 gauge Yueh catheter was advanced into the pelvic fluid collection via a posterior left transgluteal approach. Location was confirmed with CT and return of tan colored purulent material. A sample was collected and sent for microbiology analysis. Over an 035 Amplatz wire, the percutaneous tract was serially dilated to accommodate a 12 French locking drainage catheter. Location was again confirmed with CT and return of additional purulent material. Locking loop was formed, and the drainage catheter was secured to the skin using silk suture and a dressing. It was attached to  bulb suction. The patient tolerated the procedure well without immediate complication. IMPRESSION: Successful CT-guided placement of a 12 French locking drainage catheter into the pelvic abscess via left transgluteal approach. Drainage catheter attached to bulb suction. A sample was sent for microbiology analysis. Electronically Signed   By: Albin Felling M.D.   On: 03/02/2023 16:19   DG Chest Port 1 View  Result Date: 03/01/2023 CLINICAL DATA:  Pelvic abscess EXAM: PORTABLE CHEST 1 VIEW COMPARISON:  CT done earlier today FINDINGS: The heart size and mediastinal contours are within normal limits. Both lungs are clear. The visualized skeletal structures are unremarkable. IMPRESSION: No active disease. Electronically Signed   By: Elmer Picker M.D.   On: 03/01/2023 19:45   CT Abdomen Pelvis W Contrast  Result Date: 03/01/2023 CLINICAL DATA:  Left-sided pelvic mass. EXAM: CT ABDOMEN AND PELVIS WITH CONTRAST TECHNIQUE: Multidetector CT imaging of the abdomen and pelvis was performed using the standard protocol following bolus administration of intravenous contrast. RADIATION DOSE REDUCTION: This exam was performed according to the departmental dose-optimization program which includes automated exposure control, adjustment of the mA  and/or kV according to patient size and/or use of iterative reconstruction technique. CONTRAST:  159mL OMNIPAQUE IOHEXOL 300 MG/ML  SOLN COMPARISON:  None Available. FINDINGS: Lower chest: The lung bases are clear of an acute process. No pulmonary lesions or pleural effusions. Age advanced atherosclerotic calcifications involving the aorta and coronary arteries. No pericardial effusion. Hepatobiliary: No hepatic lesions or intrahepatic biliary dilatation. The gallbladder contains large rim calcified gallstones but no findings for acute cholecystitis. Normal caliber common bile duct. Pancreas: No mass, inflammation or ductal dilatation. Spleen: Normal size.  No focal lesions. Adrenals/Urinary Tract: The adrenal glands and kidneys are unremarkable. No renal lesions, renal calculi or hydronephrosis. The delayed images do not demonstrate any significant collecting system abnormalities. There is a Foley catheter noted in the bladder. Stomach/Bowel: The stomach, duodenum, small bowel and colon are. No acute inflammatory process, mass lesions or obstructive findings. There is a large amount of stool throughout colon and down into the rectosigmoid area suggesting constipation. Vascular/Lymphatic: Age advanced atherosclerotic calcifications involving the aorta and iliac arteries and branch vessels but no aneurysm or dissection. The major venous structures are patent. No mesenteric or retroperitoneal mass or adenopathy. Small scattered lymph nodes are noted. 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 There is also an adjacent large, 6.8 x 5.3 cm abscess in the left ischial rectal fossa. Ice in this broke out from the prostate gland. There is significant mass effect the rectum which is displaced to the right. I do not see any direct involvement of the rectum. Other: No pelvic mass or adenopathy. No free pelvic fluid collections. No inguinal mass  or adenopathy. No abdominal wall hernia or subcutaneous lesions. Musculoskeletal: No significant bony findings. IMPRESSION: 1. 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 There is also an adjacent large, 6.8 x 5.3 cm abscess in the left ischial rectal fossa. 2. Cholelithiasis. 3. Age advanced atherosclerotic calcifications involving the aorta and iliac arteries and branch vessels. 4. Large amount of stool throughout the colon and down into the rectosigmoid area suggesting constipation. 5. Aortic atherosclerosis. Aortic Atherosclerosis (ICD10-I70.0). Electronically Signed   By: Marijo Sanes M.D.   On: 03/01/2023 15:37     Assessment: Successful CT-guided drain placement to pelvic abscess today Prostatic abscess  Plan: Discussed TURP/transurethral unroofing prostatic abscess in a.m. We discussed procedure  in detail including potential risks of bleeding, sepsis, urethral stricture/bladder neck contracture and urinary incontinence.  The high likelihood of retrograde ejaculation was discussed.  All questions were answered and he desires to proceed    LOS: 1 day    Abbie Sons 03/02/2023

## 2023-03-02 NOTE — H&P (View-Only) (Signed)
Urology Consult Follow Up  Subjective: Successful CT-guided drain placement by IR today No complaints and feeling better Discussed TURP/unroofing of prostatic abscess 03/03/2023  Anti-infectives: Anti-infectives (From admission, onward)    Start     Dose/Rate Route Frequency Ordered Stop   03/02/23 1000  vancomycin (VANCOCIN) IVPB 1000 mg/200 mL premix        1,000 mg 200 mL/hr over 60 Minutes Intravenous Every 12 hours 03/01/23 2206     03/01/23 2200  piperacillin-tazobactam (ZOSYN) IVPB 3.375 g        3.375 g 12.5 mL/hr over 240 Minutes Intravenous Every 8 hours 03/01/23 2154     03/01/23 2200  vancomycin (VANCOREADY) IVPB 1750 mg/350 mL        1,750 mg 175 mL/hr over 120 Minutes Intravenous  Once 03/01/23 2155 03/02/23 0100   03/01/23 2115  piperacillin-tazobactam (ZOSYN) IVPB 3.375 g  Status:  Discontinued        3.375 g 100 mL/hr over 30 Minutes Intravenous  Once 03/01/23 2111 03/01/23 2154   03/01/23 2115  vancomycin (VANCOCIN) IVPB 1000 mg/200 mL premix  Status:  Discontinued        1,000 mg 200 mL/hr over 60 Minutes Intravenous  Once 03/01/23 2111 03/01/23 2155       Current Facility-Administered Medications  Medication Dose Route Frequency Provider Last Rate Last Admin   0.9 %  sodium chloride infusion   Intravenous Continuous Hugelmeyer, Alexis, DO 75 mL/hr at 03/02/23 0656 Infusion Verify at 03/02/23 0656   acetaminophen (TYLENOL) tablet 650 mg  650 mg Oral Q6H PRN Hugelmeyer, Alexis, DO       Or   acetaminophen (TYLENOL) suppository 650 mg  650 mg Rectal Q6H PRN Hugelmeyer, Alexis, DO       bisacodyl (DULCOLAX) EC tablet 5 mg  5 mg Oral Daily PRN Hugelmeyer, Alexis, DO       Chlorhexidine Gluconate Cloth 2 % PADS 6 each  6 each Topical Q0600 Hugelmeyer, Alexis, DO   6 each at 03/02/23 0541   [START ON 03/03/2023] enoxaparin (LOVENOX) injection 40 mg  40 mg Subcutaneous Q24H Clapp, Susan T, NP       fentaNYL (SUBLIMAZE) 100 MCG/2ML injection            hydrALAZINE  (APRESOLINE) injection 5 mg  5 mg Intravenous Q6H PRN Hugelmeyer, Alexis, DO       HYDROcodone-acetaminophen (NORCO/VICODIN) 5-325 MG per tablet 1-2 tablet  1-2 tablet Oral Q4H PRN Hugelmeyer, Alexis, DO   2 tablet at 03/02/23 0116   insulin aspart (novoLOG) injection 0-15 Units  0-15 Units Subcutaneous TID WC Hugelmeyer, Alexis, DO       insulin aspart (novoLOG) injection 0-5 Units  0-5 Units Subcutaneous QHS Hugelmeyer, Alexis, DO       midazolam (VERSED) 2 MG/2ML injection            morphine (PF) 2 MG/ML injection 1 mg  1 mg Intravenous Q6H PRN Hugelmeyer, Alexis, DO   1 mg at 03/02/23 1038   ondansetron (ZOFRAN) tablet 4 mg  4 mg Oral Q6H PRN Hugelmeyer, Alexis, DO       Or   ondansetron (ZOFRAN) injection 4 mg  4 mg Intravenous Q6H PRN Hugelmeyer, Alexis, DO       piperacillin-tazobactam (ZOSYN) IVPB 3.375 g  3.375 g Intravenous Q8H Belue, Nathan S, RPH 12.5 mL/hr at 03/02/23 0656 Infusion Verify at 03/02/23 0656   senna-docusate (Senokot-S) tablet 1 tablet  1 tablet Oral QHS PRN Hugelmeyer, Alexis, DO         sodium chloride flush (NS) 0.9 % injection 3 mL  3 mL Intravenous Q12H Hugelmeyer, Alexis, DO       sodium chloride flush (NS) 0.9 % injection 5 mL  5 mL Intracatheter Q8H El-Abd, Yasser J, MD       traZODone (DESYREL) tablet 25 mg  25 mg Oral QHS PRN Hugelmeyer, Alexis, DO       vancomycin (VANCOCIN) IVPB 1000 mg/200 mL premix  1,000 mg Intravenous Q12H Belue, Nathan S, RPH 200 mL/hr at 03/02/23 1028 1,000 mg at 03/02/23 1028     Objective: Vital signs in last 24 hours: Temp:  [97 F (36.1 C)-98.9 F (37.2 C)] 98.9 F (37.2 C) (04/04 1605) Pulse Rate:  [58-92] 70 (04/04 1605) Resp:  [3-22] 16 (04/04 1605) BP: (103-146)/(57-73) 113/61 (04/04 1605) SpO2:  [86 %-100 %] 97 % (04/04 1605) Weight:  [79.4 kg] 79.4 kg (04/04 1407)  Intake/Output from previous day: 04/03 0701 - 04/04 0700 In: 418.3 [I.V.:402.1; IV Piggyback:16.2] Out: 1250 [Urine:1250] Intake/Output this  shift: Total I/O In: 100 [Other:100] Out: 800 [Urine:800]   Physical Exam: Foley catheter draining clear urine Purulent fluid draining suction bulb  Lab Results:  Recent Labs    03/01/23 1858 03/02/23 0504  WBC 17.8* 16.4*  HGB 12.6* 11.5*  HCT 38.4* 34.9*  PLT 369 341   BMET Recent Labs    03/01/23 1858 03/02/23 0504  NA 130* 133*  K 4.6 3.8  CL 99 105  CO2 20* 21*  GLUCOSE 134* 146*  BUN 17 18  CREATININE 0.98 0.90  CALCIUM 9.1 8.6*   PT/INR Recent Labs    03/02/23 0504  LABPROT 15.1  INR 1.2   ABG No results for input(s): "PHART", "HCO3" in the last 72 hours.  Invalid input(s): "PCO2", "PO2"  Studies/Results: CT GUIDED VISCERAL FLUID DRAIN BY PERC CATH  Result Date: 03/02/2023 INDICATION: Pelvic abscess EXAM: CT-guided drain placement into pelvic abscess MEDICATIONS: Documented in the EMR ANESTHESIA/SEDATION: Moderate (conscious) sedation was employed during this procedure. A total of Versed 2 mg and Fentanyl 100 mcg was administered intravenously by the radiology nurse. Total intra-service moderate Sedation Time: 13 minutes. The patient's level of consciousness and vital signs were monitored continuously by radiology nursing throughout the procedure under my direct supervision. COMPLICATIONS: None immediate. PROCEDURE: Informed written consent was obtained from the patient after a thorough discussion of the procedural risks, benefits and alternatives. All questions were addressed. Maximal Sterile Barrier Technique was utilized including caps, mask, sterile gowns, sterile gloves, sterile drape, hand hygiene and skin antiseptic. A timeout was performed prior to the initiation of the procedure. The patient was placed prone on the exam table. Limited CT of the abdomen and pelvis was performed for planning purposes. This demonstrated pelvic fluid collection in the posterior pelvis. Skin entry site was marked, and the overlying skin was prepped and draped in the standard  sterile fashion. Local analgesia was obtained with 1% lidocaine. Using intermittent CT fluoroscopy, a 19 gauge Yueh catheter was advanced into the pelvic fluid collection via a posterior left transgluteal approach. Location was confirmed with CT and return of tan colored purulent material. A sample was collected and sent for microbiology analysis. Over an 035 Amplatz wire, the percutaneous tract was serially dilated to accommodate a 12 French locking drainage catheter. Location was again confirmed with CT and return of additional purulent material. Locking loop was formed, and the drainage catheter was secured to the skin using silk suture and a dressing. It was attached to   bulb suction. The patient tolerated the procedure well without immediate complication. IMPRESSION: Successful CT-guided placement of a 12 French locking drainage catheter into the pelvic abscess via left transgluteal approach. Drainage catheter attached to bulb suction. A sample was sent for microbiology analysis. Electronically Signed   By: Yasser  El-Abd M.D.   On: 03/02/2023 16:19   DG Chest Port 1 View  Result Date: 03/01/2023 CLINICAL DATA:  Pelvic abscess EXAM: PORTABLE CHEST 1 VIEW COMPARISON:  CT done earlier today FINDINGS: The heart size and mediastinal contours are within normal limits. Both lungs are clear. The visualized skeletal structures are unremarkable. IMPRESSION: No active disease. Electronically Signed   By: Palani  Rathinasamy M.D.   On: 03/01/2023 19:45   CT Abdomen Pelvis W Contrast  Result Date: 03/01/2023 CLINICAL DATA:  Left-sided pelvic mass. EXAM: CT ABDOMEN AND PELVIS WITH CONTRAST TECHNIQUE: Multidetector CT imaging of the abdomen and pelvis was performed using the standard protocol following bolus administration of intravenous contrast. RADIATION DOSE REDUCTION: This exam was performed according to the departmental dose-optimization program which includes automated exposure control, adjustment of the mA  and/or kV according to patient size and/or use of iterative reconstruction technique. CONTRAST:  100mL OMNIPAQUE IOHEXOL 300 MG/ML  SOLN COMPARISON:  None Available. FINDINGS: Lower chest: The lung bases are clear of an acute process. No pulmonary lesions or pleural effusions. Age advanced atherosclerotic calcifications involving the aorta and coronary arteries. No pericardial effusion. Hepatobiliary: No hepatic lesions or intrahepatic biliary dilatation. The gallbladder contains large rim calcified gallstones but no findings for acute cholecystitis. Normal caliber common bile duct. Pancreas: No mass, inflammation or ductal dilatation. Spleen: Normal size.  No focal lesions. Adrenals/Urinary Tract: The adrenal glands and kidneys are unremarkable. No renal lesions, renal calculi or hydronephrosis. The delayed images do not demonstrate any significant collecting system abnormalities. There is a Foley catheter noted in the bladder. Stomach/Bowel: The stomach, duodenum, small bowel and colon are. No acute inflammatory process, mass lesions or obstructive findings. There is a large amount of stool throughout colon and down into the rectosigmoid area suggesting constipation. Vascular/Lymphatic: Age advanced atherosclerotic calcifications involving the aorta and iliac arteries and branch vessels but no aneurysm or dissection. The major venous structures are patent. No mesenteric or retroperitoneal mass or adenopathy. Small scattered lymph nodes are noted. 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 There is also an adjacent large, 6.8 x 5.3 cm abscess in the left ischial rectal fossa. Ice in this broke out from the prostate gland. There is significant mass effect the rectum which is displaced to the right. I do not see any direct involvement of the rectum. Other: No pelvic mass or adenopathy. No free pelvic fluid collections. No inguinal mass  or adenopathy. No abdominal wall hernia or subcutaneous lesions. Musculoskeletal: No significant bony findings. IMPRESSION: 1. 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 There is also an adjacent large, 6.8 x 5.3 cm abscess in the left ischial rectal fossa. 2. Cholelithiasis. 3. Age advanced atherosclerotic calcifications involving the aorta and iliac arteries and branch vessels. 4. Large amount of stool throughout the colon and down into the rectosigmoid area suggesting constipation. 5. Aortic atherosclerosis. Aortic Atherosclerosis (ICD10-I70.0). Electronically Signed   By: P.  Gallerani M.D.   On: 03/01/2023 15:37     Assessment: Successful CT-guided drain placement to pelvic abscess today Prostatic abscess  Plan: Discussed TURP/transurethral unroofing prostatic abscess in a.m. We discussed procedure   in detail including potential risks of bleeding, sepsis, urethral stricture/bladder neck contracture and urinary incontinence.  The high likelihood of retrograde ejaculation was discussed.  All questions were answered and he desires to proceed    LOS: 1 day    Andreea Arca C Quinten Allerton 03/02/2023   

## 2023-03-02 NOTE — Assessment & Plan Note (Addendum)
Urinary retention Urology following. Has Foley since recent ED encounter  Undergoing (TURP)/ UNROOFING OF PROSTATE ABSCESS  today with Dr. Lonna Cobb.

## 2023-03-02 NOTE — Consult Note (Signed)
[facility] SURGICAL CONSULTATION NOTE (initial) - [id number]   HISTORY OF PRESENT ILLNESS (HPI):  72 y.o. male presented to [facility] ED yesterday for evaluation of prostate and peri-rectal abscesses. Seems on chart review patient had been having a few weeks of urologic symptoms including dysuria and difficulty urinating. Had tried bactrim in the past without improvement and was switched to [place]. Was seen in the ED on 03/25 for urinary retention and had foley catheter placed. He followed up with urology on 04/03. Unfortunately continued to complain of peri-rectal discomfort despite foley catheter in pace. Also with worsening left gluteal mass. Notes recent history of 25 lbs weight loss in the last month; unintentional. No fever, chills, CP, nausea, emesis, or abdominal pain. Has been able to pass flatus but stools have been more challenging. He did get CT Abdomen/Pelvis as an outpatient showing prostatic gland abscess and a large peri-rectal left ischial abscess. He was referred to the ED for admission. Most recent laboratory work shows a leukocytosis to 16.4K, Hgb to 11.5, sCr normal at 0.90, mild hyponatremia to 133, venous lactate normal at 1.1. He is currently on Vancomycin and Zosyn.   Surgery is consulted by emergency medicine physician Dr. Blake Divine, MD in this context for evaluation and management of pelvic and prostatic abscesses.  PAST MEDICAL HISTORY (PMH):  Past Medical History:  Diagnosis Date   Pre-diabetes      PAST SURGICAL HISTORY ([place]):  Past Surgical History:  Procedure Laterality Date   APPENDECTOMY       MEDICATIONS:  Prior to Admission medications   Medication Sig Start Date End Date Taking? Authorizing Provider  Dulaglutide (TRULICITY) [id number] [id number] SOPN Inject 0.75 mg into the skin once a week. 02/20/23   Simmons-Robinson, Riki Sheer, MD  metFORMIN (GLUCOPHAGE-XR) 500 MG 24 hr tablet Take 1 tablet (500 mg total) by mouth daily with breakfast. 02/15/23    Simmons-Robinson, Makiera, MD  solifenacin (VESICARE) 5 MG tablet Take 1 tablet (5 mg total) by mouth daily. 02/22/23   Gwyneth Sprout, FNP  sulfamethoxazole-trimethoprim (BACTRIM DS) 800-160 MG tablet Take 1 tablet by mouth 2 (two) times daily for 10 days. 02/20/23 03/02/23  Simmons-Robinson, Riki Sheer, MD  tamsulosin (FLOMAX) 0.4 MG CAPS capsule Take 1 capsule (0.4 mg total) by mouth 2 (two) times daily after a meal. 02/22/23   Gwyneth Sprout, FNP  traMADol (ULTRAM) 50 MG tablet Take 1 tablet (50 mg total) by mouth every 8 (eight) hours as needed for up to 3 days. 02/27/23 03/02/23  Simmons-Robinson, Riki Sheer, MD     ALLERGIES:  No Known Allergies   SOCIAL HISTORY:  Social History   Socioeconomic History   Marital status: Married    Spouse name: Landscape architect   Number of children: 1   Years of education: Not on file   Highest education level: Master's degree (e.g., MA, MS, MEng, MEd, MSW, MBA)  Occupational History   Not on file  Tobacco Use   Smoking status: Every Day    Packs/day: 1.00    Years: 60.00    Additional pack years: 0.00    Total pack years: 60.00    Types: Cigarettes   Smokeless tobacco: Never   Tobacco comments:    Reports that at one time he smoked 3 packs per day while in the [employer]  Vaping Use   Vaping Use: Every day   Start date: 08/08/2022  Substance and Sexual Activity   Alcohol use: Never   Drug use: Never   Sexual  activity: Not on file  Other Topics Concern   Not on file  Social History Narrative   ** Merged History Encounter **       Retired from teaching radiology for 30 years    Social Determinants of Radio broadcast assistant Strain: Not on file  Food Insecurity: No Food Insecurity (03/01/2023)   Hunger Vital Sign    Worried About Running Out of Food in the Last Year: Never true    Ran Out of Food in the Last Year: Never true  Transportation Needs: No Transportation Needs (03/01/2023)   PRAPARE - Hydrologist  (Medical): No    Lack of Transportation (Non-Medical): No  Physical Activity: Not on file  Stress: Not on file  Social Connections: Not on file  Intimate Partner Violence: Not At Risk (03/01/2023)   Humiliation, Afraid, Rape, and Kick questionnaire    Fear of Current or Ex-Partner: No    Emotionally Abused: No    Physically Abused: No    Sexually Abused: No     FAMILY HISTORY:  Family History  Problem Relation Age of Onset   Atrial fibrillation Mother    Cancer Maternal Grandmother    Diabetes Other       REVIEW OF SYSTEMS:  Review of Systems  Constitutional:  Positive for weight loss. Negative for chills and fever.  Respiratory:  Negative for cough and shortness of breath.   Cardiovascular:  Negative for chest pain and palpitations.  Gastrointestinal:  Positive for abdominal pain. Negative for constipation, diarrhea, nausea and vomiting.  Genitourinary:  Positive for dysuria. Negative for urgency.  All other systems reviewed and are negative.   VITAL SIGNS:  Temp:  [97.5 F (36.4 C)-98.5 F (36.9 C)] 98.2 F (36.8 C) (04/04 0626) Pulse Rate:  [58-93] 63 (04/04 0826) Resp:  [16-22] 18 (04/04 0826) BP: (105-146)/(57-73) 120/63 (04/04 0826) SpO2:  [97 %-100 %] 100 % (04/04 0826) Weight:  [79.4 kg] 79.4 kg (04/03 1856)     Height: 6\' 1"  (185.4 cm) Weight: 79.4 kg BMI (Calculated): 23.09   INTAKE/OUTPUT:  04/03 0701 - 04/04 0700 In: 418.3 [I.V.:402.1; IV Piggyback:16.2] Out: 1250 [Urine:1250]  PHYSICAL EXAM:  Physical Exam Vitals and nursing note reviewed. Exam conducted with a chaperone present.  Constitutional:      General: He is not in acute distress.    Appearance: Normal appearance. He is normal weight. He is not ill-appearing.     Comments: Patient resting in bed; NAD  HENT:     Head: Normocephalic and atraumatic.  Eyes:     Conjunctiva/sclera: Conjunctivae normal.     Pupils: Pupils are equal, round, and reactive to light.  Cardiovascular:     Rate and  Rhythm: Normal rate.     Pulses: Normal pulses.     Heart sounds: No murmur heard. Pulmonary:     Effort: Pulmonary effort is normal. No respiratory distress.  Genitourinary:    Comments: Chaperone present; he has induration and tenderness to the left gluteal crease, seems worse inferiorly, suprisingly no significant overlying erythema.  Musculoskeletal:     Right lower leg: No edema.     Left lower leg: No edema.  Skin:    General: Skin is warm and dry.  Neurological:     General: No focal deficit present.     Mental Status: He is alert and oriented to person, place, and time.  Psychiatric:        Mood and Affect: Mood  normal.        Behavior: Behavior normal.      Labs:     Latest Ref Rng & Units 03/02/2023    5:04 AM 03/01/2023    6:58 PM 02/20/2023    5:22 PM  CBC  WBC 4.0 - 10.5 K/uL 16.4  17.8  17.1   Hemoglobin 13.0 - 17.0 g/dL 11.5  12.6  12.9   Hematocrit 39.0 - 52.0 % 34.9  38.4  38.6   Platelets 150 - 400 K/uL 341  369  331       Latest Ref Rng & Units 03/02/2023    5:04 AM 03/01/2023    7:54 PM 03/01/2023    6:58 PM  CMP  Glucose 70 - 99 mg/dL 146   134   BUN 8 - 23 mg/dL 18   17   Creatinine 0.61 - 1.24 mg/dL 0.90   0.98   Sodium 135 - 145 mmol/L 133   130   Potassium 3.5 - 5.1 mmol/L 3.8   4.6   Chloride 98 - 111 mmol/L 105   99   CO2 22 - 32 mmol/L 21   20   Calcium 8.9 - 10.3 mg/dL 8.6   9.1   Total Protein 6.5 - 8.1 g/dL 7.2  8.4    Total Bilirubin 0.3 - 1.2 mg/dL 0.6  0.8    Alkaline Phos 38 - 126 U/L 170  186    AST 15 - 41 U/L 36  40    ALT 0 - 44 U/L 48  48       Imaging studies:   CT Abdomen/Pelvis (03/01/2023) personally reviewed showing noted peri-rectal/left ischial abscess, prostate abscesses, foley in place, no evidence of diverticulitis nor diverticulitis, and radiologist report reviewed below:  IMPRESSION: 1. 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 There is also an  adjacent large, 6.8 x 5.3 cm abscess in the left ischial rectal fossa. 2. Cholelithiasis. 3. Age advanced atherosclerotic calcifications involving the aorta and iliac arteries and branch vessels. 4. Large amount of stool throughout the colon and down into the rectosigmoid area suggesting constipation. 5. Aortic atherosclerosis.   Assessment/Plan: (ICD-10's: K65.1) 72 y.o. male with rectal discomfort, urinary retention, and leukocytosis found to have prostate gland abscesses as well as a large left peri-rectal/ischial abscess measuring 6.8 x 5.3 cm   - Appreciate medicine admission - Appreciate urology input; foley per their service - Agree with IR evaluation for percutaneous aspiration/drain placement - If unable to drain, may need surgical I&D - NPO for now pending potential procedures today - Continue IV Abx (Vancomycin/Zosyn) - Monitor leukocytosis - Pain control prn  - Mobilize as tolerated   - Further management per primary service; we will follow    All of the above findings and recommendations were discussed with the patient, and all of patient's questions were answered to his expressed satisfaction.  Thank you for the opportunity to participate in this patient's care.   -- Edison Simon, PA-C [facility] 03/02/2023, 9:01 AM M-F: 7am - 4pm

## 2023-03-02 NOTE — Assessment & Plan Note (Addendum)
CT abdomen/pelvis showed a large 6.8 x 5.3 cm abscess in the left ischial rectal fossa in addition to multiple prostate gland abscesses. Abscess cultures grew MSSA. --General surgery and IR consulted --s/p CT-guided drainage with IR on 4/4 --Initially treated w IV Vanc/Zosyn --De-escalated to IV Ancef 4/7 --Discharge on Bactrim DS x 4 weeks, also for Prostatisis, duration per urology --Follow blood cultures --Monitor drain output --Pain control --IR recommends d/c home with drain, flush daily with 5 cc sterile saline, monitor output daily and follow up outpatient. They will arrange appt.

## 2023-03-02 NOTE — Consult Note (Signed)
Chief Complaint: Patient was seen in consultation today for pelvic abscess at the request of Nicole Kindred  Referring Physician(s): Nicole Kindred  Supervising Physician: Juliet Rude  Patient Status: [place] - In-pt  History of Present Illness:  Colin Griffin is a 72 y.o. male who presented to [facility] ED 03/01/2023 for evaluation of prostate and perirectal abscesses.  Patient has been treated for urologic symptoms including dysuria and difficulty urinating.  He was treated with Bactrim but without improvement was switched to [place].  He was previously seen in the ED 02/20/2023 for urinary retention and had Foley catheter placed.  After following up with urology for 02/15/2023 and continuing to complain of perirectal discomfort despite Foley catheter in place he underwent OP CT AP:  IMPRESSION: 1. 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 There is also an adjacent large, 6.8 x 5.3 cm abscess in the left ischial rectal fossa.    He was then referred to [facility] ED for admit and further workup.  Most recent labs show leukocytosis of 16.4, hemoglobin 11.5, lactic acid 1.1.  Patient was referred to IR for pelvic abscess drain placement.  Imaging was reviewed and approved by Dr. Ky Barban Abd for CT-guided from left transgluteal drain placement.  Pt denies fever, chills, fever, SOB, CP, N/V, loss of appetite, dizziness, HA or weakness.  He endorses fatigue, abdominal tenderness and rectal pain. He is NPO per order.   Past Medical History:  Diagnosis Date   Pre-diabetes     Past Surgical History:  Procedure Laterality Date   APPENDECTOMY      Allergies: Patient has no known allergies.  Medications: Prior to Admission medications   Medication Sig Start Date End Date Taking? Authorizing Provider  Dulaglutide (TRULICITY) [id number] [id number] SOPN Inject 0.75 mg into the skin once a week. 02/20/23   Simmons-Robinson, Riki Sheer, MD   metFORMIN (GLUCOPHAGE-XR) 500 MG 24 hr tablet Take 1 tablet (500 mg total) by mouth daily with breakfast. 02/15/23   Simmons-Robinson, Makiera, MD  solifenacin (VESICARE) 5 MG tablet Take 1 tablet (5 mg total) by mouth daily. 02/22/23   Gwyneth Sprout, FNP  sulfamethoxazole-trimethoprim (BACTRIM DS) 800-160 MG tablet Take 1 tablet by mouth 2 (two) times daily for 10 days. 02/20/23 03/02/23  Simmons-Robinson, Riki Sheer, MD  tamsulosin (FLOMAX) 0.4 MG CAPS capsule Take 1 capsule (0.4 mg total) by mouth 2 (two) times daily after a meal. 02/22/23   Gwyneth Sprout, FNP  traMADol (ULTRAM) 50 MG tablet Take 1 tablet (50 mg total) by mouth every 8 (eight) hours as needed for up to 3 days. 02/27/23 03/02/23  Simmons-Robinson, Riki Sheer, MD     Family History  Problem Relation Age of Onset   Atrial fibrillation Mother    Cancer Maternal Grandmother    Diabetes Other     Social History   Socioeconomic History   Marital status: Married    Spouse name: Landscape architect   Number of children: 1   Years of education: Not on file   Highest education level: Master's degree (e.g., MA, MS, MEng, MEd, MSW, MBA)  Occupational History   Not on file  Tobacco Use   Smoking status: Every Day    Packs/day: 1.00    Years: 60.00    Additional pack years: 0.00    Total pack years: 60.00    Types: Cigarettes   Smokeless tobacco: Never   Tobacco comments:    Reports that at one time he smoked 3  packs per day while in the [employer] Use   Vaping Use: Every day   Start date: 08/08/2022  Substance and Sexual Activity   Alcohol use: Never   Drug use: Never   Sexual activity: Not on file  Other Topics Concern   Not on file  Social History Narrative   ** Merged History Encounter **       Retired from [employer] for 30 years    Social Determinants of Radio broadcast assistant Strain: Not on file  Food Insecurity: No [place] (03/01/2023)   Hunger Vital Sign    Worried About Running Out of Food  in the Last Year: Never true    [place] in the Last Year: Never true  Transportation Needs: No Transportation Needs (03/01/2023)   PRAPARE - Hydrologist (Medical): No    Lack of Transportation (Non-Medical): No  Physical Activity: Not on file  Stress: Not on file  Social Connections: Not on file    Review of Systems: A 12 point ROS discussed and pertinent positives are indicated in the HPI above.  All other systems are negative.  Review of Systems  Constitutional:  Positive for fatigue. Negative for appetite change, chills and fever.  Respiratory:  Negative for shortness of breath.   Cardiovascular:  Negative for chest pain.  Gastrointestinal:  Positive for abdominal pain and rectal pain. Negative for nausea and vomiting.  Neurological:  Negative for dizziness, weakness and headaches.    Vital Signs: BP 120/63 (BP Location: Right Arm)   Pulse 63   Temp 98.2 F (36.8 C)   Resp 18   Ht 6\' 1"  (1.854 m)   Wt 175 lb (79.4 kg)   SpO2 100%   BMI 23.09 kg/m     Physical Exam Vitals reviewed.  Constitutional:      General: He is not in acute distress.    Appearance: Normal appearance. He is not ill-appearing.  HENT:     Head: Normocephalic and atraumatic.     Mouth/Throat:     Mouth: Mucous membranes are dry.     Pharynx: Oropharynx is clear.  Eyes:     Extraocular Movements: Extraocular movements intact.     Pupils: Pupils are equal, round, and reactive to light.  Cardiovascular:     Rate and Rhythm: Normal rate and regular rhythm.     Pulses: Normal pulses.     Heart sounds: Normal heart sounds.  Pulmonary:     Effort: Pulmonary effort is normal. No respiratory distress.  Abdominal:     General: Bowel sounds are normal. There is no distension.     Palpations: Abdomen is soft.     Tenderness: There is abdominal tenderness. There is no guarding.  Musculoskeletal:     Right lower leg: No edema.     Left lower leg: No edema.   Skin:    General: Skin is dry.  Neurological:     Mental Status: He is alert and oriented to person, place, and time.  Psychiatric:        Mood and Affect: Mood normal.        Thought Content: Thought content normal.        Judgment: Judgment normal.     Imaging: DG Chest Port 1 View  Result Date: 03/01/2023 CLINICAL DATA:  Pelvic abscess EXAM: PORTABLE CHEST 1 VIEW COMPARISON:  CT done earlier today FINDINGS: The heart size and mediastinal contours are within  normal limits. Both lungs are clear. The visualized skeletal structures are unremarkable. IMPRESSION: No active disease. Electronically Signed   By: Elmer Picker M.D.   On: 03/01/2023 19:45   CT Abdomen Pelvis W Contrast  Result Date: 03/01/2023 CLINICAL DATA:  Left-sided pelvic mass. EXAM: CT ABDOMEN AND PELVIS WITH CONTRAST TECHNIQUE: Multidetector CT imaging of the abdomen and pelvis was performed using the standard protocol following bolus administration of intravenous contrast. RADIATION DOSE REDUCTION: This exam was performed according to the departmental dose-optimization program which includes automated exposure control, adjustment of the mA and/or kV according to patient size and/or use of iterative reconstruction technique. CONTRAST:  124mL OMNIPAQUE IOHEXOL 300 MG/ML  SOLN COMPARISON:  None Available. FINDINGS: Lower chest: The lung bases are clear of an acute process. No pulmonary lesions or pleural effusions. Age advanced atherosclerotic calcifications involving the aorta and coronary arteries. No pericardial effusion. Hepatobiliary: No hepatic lesions or intrahepatic biliary dilatation. The gallbladder contains large rim calcified gallstones but no findings for acute cholecystitis. Normal caliber common bile duct. Pancreas: No mass, inflammation or ductal dilatation. Spleen: Normal size.  No focal lesions. Adrenals/Urinary Tract: The adrenal glands and kidneys are unremarkable. No renal lesions, renal calculi or  hydronephrosis. The delayed images do not demonstrate any significant collecting system abnormalities. There is a Foley catheter noted in the bladder. Stomach/Bowel: The stomach, duodenum, small bowel and colon are. No acute inflammatory process, mass lesions or obstructive findings. There is a large amount of stool throughout colon and down into the rectosigmoid area suggesting constipation. Vascular/Lymphatic: Age advanced atherosclerotic calcifications involving the aorta and iliac arteries and branch vessels but no aneurysm or dissection. The major venous structures are patent. No mesenteric or retroperitoneal mass or adenopathy. Small scattered lymph nodes are noted. 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 There is also an adjacent large, 6.8 x 5.3 cm abscess in the left ischial rectal fossa. Ice in this broke out from the prostate gland. There is significant mass effect the rectum which is displaced to the right. I do not see any direct involvement of the rectum. Other: No pelvic mass or adenopathy. No free pelvic fluid collections. No inguinal mass or adenopathy. No abdominal wall hernia or subcutaneous lesions. Musculoskeletal: No significant bony findings. IMPRESSION: 1. 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 There is also an adjacent large, 6.8 x 5.3 cm abscess in the left ischial rectal fossa. 2. Cholelithiasis. 3. Age advanced atherosclerotic calcifications involving the aorta and iliac arteries and branch vessels. 4. Large amount of stool throughout the colon and down into the rectosigmoid area suggesting constipation. 5. Aortic atherosclerosis. Aortic Atherosclerosis (ICD10-I70.0). Electronically Signed   By: Marijo Sanes M.D.   On: 03/01/2023 15:37    Labs:  CBC: Recent Labs    02/14/23 1510 02/20/23 1722 03/01/23 1858 03/02/23 0504  WBC  14.4* 17.1* 17.8* 16.4*  HGB 13.3 12.9* 12.6* 11.5*  HCT 38.7 38.6* 38.4* 34.9*  PLT 218 331 369 341    COAGS: Recent Labs    03/02/23 0504  INR 1.2    BMP: Recent Labs    02/14/23 1510 02/20/23 1722 03/01/23 1858 03/02/23 0504  NA 136 131* 130* 133*  K 3.9 3.9 4.6 3.8  CL 100 103 99 105  CO2 20 22 20* 21*  GLUCOSE 211* 148* 134* 146*  BUN 12 14 17 18   CALCIUM 9.4 8.8* 9.1 8.6*  CREATININE 0.87 0.82  0.98 0.90  GFRNONAA  --  >60 >60 >60    LIVER FUNCTION TESTS: Recent Labs    02/14/23 1510 02/20/23 1722 03/01/23 1954 03/02/23 0504  BILITOT 0.4 0.8 0.8 0.6  AST 16 20 40 36  ALT 17 22 48* 48*  ALKPHOS 118 87 186* 170*  PROT 7.1 8.1 8.4* 7.2  ALBUMIN 4.1 3.5 3.2* 2.6*    TUMOR MARKERS: No results for input(s): "AFPTM", "CEA", "CA199", "CHROMGRNA" in the last 8760 hours.  Assessment and Plan:  72 year old male with PMHx of DM II presents to IR for pelvic abscess drain placement.  Pt resting in bed with friend at bedside.  He is A&O, calm and pleasant.  He is in no distress.   Risks and benefits of pelvic abscess drain placement with moderate sedation discussed with the patient including bleeding, infection, damage to adjacent structures, bowel perforation/fistula connection, and sepsis.  All of the patient's questions were answered, patient is agreeable to proceed. Consent signed and in chart.  Thank you for this interesting consult.  I greatly enjoyed meeting Bossie Gene Blackmore and look forward to participating in their care.  A copy of this report was sent to the requesting provider on this date.  Electronically Signed: Tyson Alias, NP 03/02/2023, 10:22 AM   I spent a total of 20 minutes in face to face in clinical consultation, greater than 50% of which was counseling/coordinating care for pelvic abscess.

## 2023-03-02 NOTE — Hospital Course (Addendum)
HPI on admission 03/01/2023: "Colin Griffin is a 72 y.o. male with a known history of diabetes diagnosed 1 month ago presents to the ED for evaluation of pelvic abscess.  Patient was in a usual state of health until 1 month ago he reports that he was diagnosed with a urinary tract infection for which she completed a course of Bactrim.  He was seen in the ED about 10 days ago for urinary retention and Foley catheter was placed.  He followed up with urology today for a CT scan which showed a small abscess in the prostate and a larger pelvic abscess.  He was referred by urology to the ED for admission for IR drainage of the abscess as well as IV antibiotics.   Of note he reports 25 pound weight loss in the past 2 months since being diagnosed with diabetes.  He has made significant changes to his diet. ...   Patient was started on IV Vancomycin and Zosyn, admitted to medicine service with general surgery and urology consulted.  IR consulted for CT-guided drainage and obtain cultures.  Further hospital course and management as outlined below.

## 2023-03-02 NOTE — Progress Notes (Signed)
Patient clinically stable post 12 FR abscess drain placement per Dr Denna Haggard, tolerated well. Denies complaints post procedure. Received Versed 2 mg along with Fentanyl 100 mcg IV for procedure. Purulent gray drainage per drain. Report given to Genelle Bal RN post procedure./330

## 2023-03-02 NOTE — Procedures (Signed)
Interventional Radiology Procedure Note  Date of Procedure: 03/02/2023  Procedure: CT drain placement   Findings:  1. CT drain placement into pelvic abscess via left transgluteal approach, 12 Fr locking placed to bulb    Complications: No immediate complications noted.   Estimated Blood Loss: minimal  Follow-up and Recommendations: 1. Flush x3 daily  2. Trend output  3. Follow up per Urology    Albin Felling, MD  Vascular & Interventional Radiology  03/02/2023 3:22 PM

## 2023-03-02 NOTE — Progress Notes (Signed)
Progress Note   Patient: Colin Griffin [id number] DOB: May 09, 1951 DOA: 03/01/2023     1 DOS: the patient was seen and examined on 03/02/2023   Brief hospital course: HPI on admission 03/01/2023: "Colin Griffin is a 72 y.o. male with a known history of diabetes diagnosed 1 month ago presents to the ED for evaluation of pelvic abscess.  Patient was in a usual state of health until 1 month ago he reports that he was diagnosed with a urinary tract infection for which she completed a course of Bactrim.  He was seen in the ED about 10 days ago for urinary retention and Foley catheter was placed.  He followed up with urology today for a CT scan which showed a small abscess in the prostate and a larger pelvic abscess.  He was referred by urology to the ED for admission for IR drainage of the abscess as well as IV antibiotics.   Of note he reports 25 pound weight loss in the past 2 months since being diagnosed with diabetes.  He has made significant changes to his diet. ...   Patient was started on IV Vancomycin and Zosyn, admitted to medicine service with general surgery and urology consulted.  IR consulted for CT-guided drainage and obtain cultures.  Assessment and Plan: * Pelvic abscess in male CT abdomen/pelvis showed a large 6.8 x 5.3 cm abscess in the left ischial rectal fossa in addition to multiple prostate gland abscesses. --General surgery and IR consulted --CT-guided drainage today --Send abscess fluid for cultures --Follow blood cultures --Pain control --Continue IV Vanc/Zosyn pending cultures  Prostatitis Urinary retention Urology following. Has Foley since recent ED encounter  NPO after midnight Plan is for TURP/transurethral unroofing prostatic abscess tomorrow AM  Type 2 diabetes mellitus with hyperglycemia, without long-term current use of insulin Recently diagnosed.  Hbg A1c is 11.1%. He has changed diet dramatically and is losing weight.  Sugars he reports fairly well  controlled. --Sliding scale Novolog coverage --Diabetes education appreciated --Continue diet and lifestyle efforts --Inpatient CBG goal is 140-180        Subjective: Pt seen today before going to radiology.  He reports significant discomfort, had become so severe he could barely walk.  Pain controlled fairly well right now. No fever/chills.  Reports completely changing his diet, losing weight.  Agreeable to insulin if needed but reports sugars have been fairly well controlled.    Physical Exam: Vitals:   03/02/23 1515 03/02/23 1535 03/02/23 1545 03/02/23 1605  BP: 105/72 103/61 115/61 113/61  Pulse: 69 66 61 70  Resp: (!) 3 17 18 16   Temp:    98.9 F (37.2 C)  TempSrc:      SpO2: (!) 86% 96% 98% 97%  Weight:      Height:       General exam: awake, alert, no acute distress HEENT: atraumatic, clear conjunctiva, anicteric sclera, moist mucus membranes, hearing grossly normal  Respiratory system: CTAB, no wheezes, rales or rhonchi, normal respiratory effort. Cardiovascular system: normal S1/S2,  RRR, no JVD, murmurs, rubs, gallops,  no pedal edema.   Gastrointestinal system: soft, NT, ND, no HSM felt, +bowel sounds. Central nervous system: A&O x. no gross focal neurologic deficits, normal speech Extremities: moves all , no edema, normal tone Skin: dry, intact, normal temperature Psychiatry: normal mood, congruent affect, judgement and insight appear normal   Data Reviewed:  Notable labs ---  Na 133, CO2 21, glucose 146, Ca 8.6, Alk phos 170, albumin 2.6, ALT 48, lactate  1.1, WBC 16.4k, Hbg 11.5.  Micro ---  Blood cultures -- pending - negative to date Pelvic abscess cultures --- pending  Family Communication:  None. Pt is able to update.  Disposition: Status is: Inpatient Remains inpatient appropriate because: remains on IV broad spectrum antibiotics pending cultures, additional procedure/s needed as above.   Planned Discharge Destination: Home    Time spent: 42  minutes  Author: Ezekiel Slocumb, DO 03/02/2023 6:59 PM  For on call review [email or web address].

## 2023-03-02 NOTE — Assessment & Plan Note (Addendum)
Recently diagnosed.  Hbg A1c is 11.1%. He has changed diet dramatically and is losing weight.  Sugars he reports fairly well controlled. --Sliding scale Novolog coverage during admission --Resume metformin and recently started Trulicity on d/c --Close PCP follow up --Diabetes education appreciated --Continue diet and lifestyle efforts --Inpatient CBG goal is 140-180

## 2023-03-03 ENCOUNTER — Encounter
Admission: EM | Disposition: A | Discharge status: Home / Self Care | Payer: Self-pay | Source: Home / Self Care | Attending: Internal Medicine

## 2023-03-03 ENCOUNTER — Inpatient Hospital Stay: Payer: Medicare PPO | Admitting: Anesthesiology

## 2023-03-03 ENCOUNTER — Encounter: Payer: Self-pay | Admitting: Family Medicine

## 2023-03-03 DIAGNOSIS — K651 Peritoneal abscess: Secondary | ICD-10-CM | POA: Diagnosis not present

## 2023-03-03 DIAGNOSIS — N412 Abscess of prostate: Secondary | ICD-10-CM | POA: Diagnosis not present

## 2023-03-03 DIAGNOSIS — R7989 Other specified abnormal findings of blood chemistry: Secondary | ICD-10-CM | POA: Diagnosis present

## 2023-03-03 HISTORY — PX: TRANSURETHRAL RESECTION OF PROSTATE: SHX73

## 2023-03-03 LAB — GLUCOSE, CAPILLARY
Glucose-Capillary: 135 mg/dL — ABNORMAL HIGH (ref 70–99)
Glucose-Capillary: 169 mg/dL — ABNORMAL HIGH (ref 70–99)
Glucose-Capillary: 101 mg/dL — ABNORMAL HIGH (ref 70–99)
Glucose-Capillary: 146 mg/dL — ABNORMAL HIGH (ref 70–99)
Glucose-Capillary: 143 mg/dL — ABNORMAL HIGH (ref 70–99)
Glucose-Capillary: 190 mg/dL — ABNORMAL HIGH (ref 70–99)
Glucose-Capillary: 273 mg/dL — ABNORMAL HIGH (ref 70–99)

## 2023-03-03 LAB — CBC
HCT: 33.1 % — ABNORMAL LOW (ref 39.0–52.0)
Hemoglobin: 10.8 g/dL — ABNORMAL LOW (ref 13.0–17.0)
MCH: 30.1 pg (ref 26.0–34.0)
MCHC: 32.6 g/dL (ref 30.0–36.0)
MCV: 92.2 fL (ref 80.0–100.0)
Platelets: 281 10*3/uL (ref 150–400)
RBC: 3.59 MIL/uL — ABNORMAL LOW (ref 4.22–5.81)
RDW: 12.5 % (ref 11.5–15.5)
WBC: 12.9 10*3/uL — ABNORMAL HIGH (ref 4.0–10.5)
nRBC: 0 % (ref 0.0–0.2)

## 2023-03-03 LAB — COMPREHENSIVE METABOLIC PANEL
ALT: 68 U/L — ABNORMAL HIGH (ref 0–44)
AST: 57 U/L — ABNORMAL HIGH (ref 15–41)
Albumin: 2.4 g/dL — ABNORMAL LOW (ref 3.5–5.0)
Alkaline Phosphatase: 188 U/L — ABNORMAL HIGH (ref 38–126)
Anion gap: 6 (ref 5–15)
BUN: 13 mg/dL (ref 8–23)
CO2: 23 mmol/L (ref 22–32)
Calcium: 8.7 mg/dL — ABNORMAL LOW (ref 8.9–10.3)
Chloride: 109 mmol/L (ref 98–111)
Creatinine, Ser: 0.91 mg/dL (ref 0.61–1.24)
GFR, Estimated: >60 mL/min (ref >60)
Glucose, Bld: 130 mg/dL — ABNORMAL HIGH (ref 70–99)
Potassium: 3.9 mmol/L (ref 3.5–5.1)
Sodium: 138 mmol/L (ref 135–145)
Total Bilirubin: 0.8 mg/dL (ref 0.3–1.2)
Total Protein: 6.6 g/dL (ref 6.5–8.1)

## 2023-03-03 LAB — MAGNESIUM: Magnesium: 2 mg/dL (ref 1.7–2.4)

## 2023-03-03 SURGERY — TURP (TRANSURETHRAL RESECTION OF PROSTATE)
Anesthesia: General | Site: Prostate

## 2023-03-03 MED ORDER — TRAZODONE HCL 50 MG PO TABS
50.0000 mg | ORAL_TABLET | Freq: Every evening | ORAL | Status: DC | PRN
  Administered 2023-03-03: 50 mg via ORAL
  Filled 2023-03-03: qty 1

## 2023-03-03 MED ORDER — FENTANYL CITRATE (PF) 100 MCG/2ML IJ SOLN
INTRAMUSCULAR | Status: AC
  Filled 2023-03-03: qty 2

## 2023-03-03 MED ORDER — DEXAMETHASONE SODIUM PHOSPHATE 10 MG/ML IJ SOLN
INTRAMUSCULAR | Status: AC
  Filled 2023-03-03: qty 1

## 2023-03-03 MED ORDER — ONDANSETRON HCL 4 MG/2ML IJ SOLN
INTRAMUSCULAR | Status: AC
  Filled 2023-03-03: qty 2

## 2023-03-03 MED ORDER — CHLORHEXIDINE GLUCONATE 0.12 % MT SOLN
OROMUCOSAL | Status: AC
  Filled 2023-03-03: qty 15

## 2023-03-03 MED ORDER — OXYCODONE HCL 5 MG/5ML PO SOLN: 5.0000 mg | Freq: Once | ORAL | Status: DC | PRN

## 2023-03-03 MED ORDER — ACETAMINOPHEN 10 MG/ML IV SOLN: 1000.0000 mg | Freq: Once | INTRAVENOUS | Status: DC | PRN

## 2023-03-03 MED ORDER — CHLORHEXIDINE GLUCONATE 0.12 % MT SOLN
15.0000 mL | Freq: Once | OROMUCOSAL | Status: AC
  Administered 2023-03-03: 15 mL via OROMUCOSAL

## 2023-03-03 MED ORDER — MIDAZOLAM HCL 2 MG/2ML IJ SOLN
INTRAMUSCULAR | Status: DC | PRN
  Administered 2023-03-03: 2 mg via INTRAVENOUS

## 2023-03-03 MED ORDER — ACETAMINOPHEN 10 MG/ML IV SOLN
INTRAVENOUS | Status: DC | PRN
  Administered 2023-03-03: 1000 mg via INTRAVENOUS

## 2023-03-03 MED ORDER — EPHEDRINE SULFATE (PRESSORS) 50 MG/ML IJ SOLN
INTRAMUSCULAR | Status: DC | PRN
  Administered 2023-03-03: 10 mg via INTRAVENOUS

## 2023-03-03 MED ORDER — KETAMINE HCL 50 MG/5ML IJ SOSY
PREFILLED_SYRINGE | INTRAMUSCULAR | Status: AC
  Filled 2023-03-03: qty 5

## 2023-03-03 MED ORDER — FENTANYL CITRATE (PF) 100 MCG/2ML IJ SOLN
25.0000 ug | INTRAMUSCULAR | Status: DC | PRN
  Administered 2023-03-03: 50 ug via INTRAVENOUS

## 2023-03-03 MED ORDER — DEXAMETHASONE SODIUM PHOSPHATE 10 MG/ML IJ SOLN
INTRAMUSCULAR | Status: DC | PRN
  Administered 2023-03-03: 5 mg via INTRAVENOUS

## 2023-03-03 MED ORDER — EPHEDRINE 5 MG/ML INJ
INTRAVENOUS | Status: AC
  Filled 2023-03-03: qty 5

## 2023-03-03 MED ORDER — OXYCODONE HCL 5 MG PO TABS: 5.0000 mg | ORAL_TABLET | Freq: Once | ORAL | Status: DC | PRN

## 2023-03-03 MED ORDER — LIDOCAINE HCL (PF) 2 % IJ SOLN
INTRAMUSCULAR | Status: AC
  Filled 2023-03-03: qty 5

## 2023-03-03 MED ORDER — MIDAZOLAM HCL 2 MG/2ML IJ SOLN
INTRAMUSCULAR | Status: AC
  Filled 2023-03-03: qty 2

## 2023-03-03 MED ORDER — POLYETHYLENE GLYCOL 3350 17 G PO PACK
17.0000 g | PACK | Freq: Every day | ORAL | Status: DC
  Administered 2023-03-03 – 2023-03-07 (×4): 17 g via ORAL
  Filled 2023-03-03 (×6): qty 1

## 2023-03-03 MED ORDER — DROPERIDOL 2.5 MG/ML IJ SOLN: 0.6250 mg | Freq: Once | INTRAMUSCULAR | Status: DC | PRN

## 2023-03-03 MED ORDER — PROMETHAZINE HCL 25 MG/ML IJ SOLN: 6.2500 mg | INTRAMUSCULAR | Status: DC | PRN

## 2023-03-03 MED ORDER — ROCURONIUM BROMIDE 10 MG/ML (PF) SYRINGE
PREFILLED_SYRINGE | INTRAVENOUS | Status: AC
  Filled 2023-03-03: qty 10

## 2023-03-03 MED ORDER — SODIUM CHLORIDE 0.9 % IR SOLN
Status: DC | PRN
  Administered 2023-03-03: 12000 mL via INTRAVESICAL

## 2023-03-03 MED ORDER — FENTANYL CITRATE (PF) 100 MCG/2ML IJ SOLN
INTRAMUSCULAR | Status: DC | PRN
  Administered 2023-03-03: 25 ug via INTRAVENOUS
  Administered 2023-03-03: 50 ug via INTRAVENOUS
  Administered 2023-03-03: 25 ug via INTRAVENOUS

## 2023-03-03 MED ORDER — LIDOCAINE HCL (CARDIAC) PF 100 MG/5ML IV SOSY
PREFILLED_SYRINGE | INTRAVENOUS | Status: DC | PRN
  Administered 2023-03-03: 100 mg via INTRAVENOUS

## 2023-03-03 MED ORDER — PROPOFOL 10 MG/ML IV BOLUS
INTRAVENOUS | Status: DC | PRN
  Administered 2023-03-03: 100 mg via INTRAVENOUS

## 2023-03-03 MED ORDER — DEXMEDETOMIDINE HCL IN NACL 80 MCG/20ML IV SOLN
INTRAVENOUS | Status: DC | PRN
  Administered 2023-03-03 (×2): 8 ug via BUCCAL

## 2023-03-03 MED ORDER — ONDANSETRON HCL 4 MG/2ML IJ SOLN
INTRAMUSCULAR | Status: DC | PRN
  Administered 2023-03-03: 4 mg via INTRAVENOUS

## 2023-03-03 MED ORDER — GLYCOPYRROLATE 0.2 MG/ML IJ SOLN
INTRAMUSCULAR | Status: AC
  Filled 2023-03-03: qty 1

## 2023-03-03 MED ORDER — GLYCOPYRROLATE 0.2 MG/ML IJ SOLN
INTRAMUSCULAR | Status: DC | PRN
  Administered 2023-03-03: .1 mg via INTRAVENOUS

## 2023-03-03 MED ORDER — KETAMINE HCL 10 MG/ML IJ SOLN
INTRAMUSCULAR | Status: DC | PRN
  Administered 2023-03-03: 20 mg via INTRAVENOUS
  Administered 2023-03-03: 10 mg via INTRAVENOUS

## 2023-03-03 MED ORDER — SODIUM CHLORIDE 0.9 % IV SOLN: INTRAVENOUS | Status: DC | PRN

## 2023-03-03 MED ORDER — SENNOSIDES-DOCUSATE SODIUM 8.6-50 MG PO TABS
1.0000 | ORAL_TABLET | Freq: Two times a day (BID) | ORAL | Status: DC
  Administered 2023-03-03 – 2023-03-05 (×5): 1 via ORAL
  Filled 2023-03-03 (×8): qty 1

## 2023-03-03 SURGICAL SUPPLY — 25 items
ADAPTER IRRIG TUBE 2 SPIKE SOL (ADAPTER) ×2 IMPLANT
BAG DRAIN SIEMENS DORNER NS (MISCELLANEOUS) ×1 IMPLANT
BAG URO DRAIN 4000ML (MISCELLANEOUS) ×1 IMPLANT
CATH FOL 2WAY LX 20X30 (CATHETERS) IMPLANT
CATH FOLEY 3WAY 30CC 22FR (CATHETERS) IMPLANT
DRAPE UTILITY 15X26 TOWEL STRL (DRAPES) ×1 IMPLANT
ELECT BIVAP BIPO 22/24 DONUT (ELECTROSURGICAL)
ELECT LOOP 22F BIPOLAR SML (ELECTROSURGICAL) ×1
ELECTRD BIVAP BIPO 22/24 DONUT (ELECTROSURGICAL) IMPLANT
ELECTRODE LOOP 22F BIPOLAR SML (ELECTROSURGICAL) IMPLANT
GAUZE 4X4 16PLY ~~LOC~~+RFID DBL (SPONGE) ×2 IMPLANT
GLOVE BIOGEL PI IND STRL 7.5 (GLOVE) ×1 IMPLANT
GOWN STRL REUS W/ TWL LRG LVL3 (GOWN DISPOSABLE) ×1 IMPLANT
GOWN STRL REUS W/ TWL XL LVL3 (GOWN DISPOSABLE) ×1 IMPLANT
GOWN STRL REUS W/TWL LRG LVL3 (GOWN DISPOSABLE) ×1
GOWN STRL REUS W/TWL XL LVL3 (GOWN DISPOSABLE) ×1
HOLDER FOLEY CATH W/STRAP (MISCELLANEOUS) ×1 IMPLANT
IV NS IRRIG 3000ML ARTHROMATIC (IV SOLUTION) ×6 IMPLANT
KIT TURNOVER CYSTO (KITS) ×1 IMPLANT
LOOP CUT BIPOLAR 24F LRG (ELECTROSURGICAL) IMPLANT
PACK CYSTO AR (MISCELLANEOUS) ×1 IMPLANT
SET IRRIG Y TYPE TUR BLADDER L (SET/KITS/TRAYS/PACK) ×1 IMPLANT
SYR TOOMEY IRRIG 70ML (MISCELLANEOUS) ×1
SYRINGE TOOMEY IRRIG 70ML (MISCELLANEOUS) ×1 IMPLANT
WATER STERILE IRR 500ML POUR (IV SOLUTION) ×1 IMPLANT

## 2023-03-03 NOTE — TOC Initial Note (Signed)
Transition of Care [facility]) - Initial/Assessment Note    Patient Details  Name: Colin Griffin MRN: [id number] Date of Birth: 11-19-1951  Transition of Care [facility]) CM/SW Contact:    Chapman Fitch, RN Phone Number: 03/03/2023, 4:05 PM  Clinical Narrative:                   Transition of Care (TOC) Screening Note   Patient Details  Name: Colin Griffin Date of Birth: 31-Aug-1951   Transition of Care [facility]) CM/SW Contact:    Chapman Fitch, RN Phone Number: 03/03/2023, 4:05 PM    Transition of Care Department [facility]) has reviewed patient and no TOC needs have been identified at this time. We will continue to monitor patient advancement through interdisciplinary progression rounds. If new patient transition needs arise, please place a TOC consult.   Please consult TOC if IV antibiotics needed at discharge       Patient Goals and CMS Choice            Expected Discharge Plan and Services                                              Prior Living Arrangements/Services                       Activities of Daily Living Home Assistive Devices/Equipment: Eyeglasses ADL Screening (condition at time of admission) Patient's cognitive ability adequate to safely complete daily activities?: Yes Is the patient deaf or have difficulty hearing?: No Does the patient have difficulty seeing, even when wearing glasses/contacts?: No Does the patient have difficulty concentrating, remembering, or making decisions?: No Patient able to express need for assistance with ADLs?: Yes Does the patient have difficulty dressing or bathing?: No Independently performs ADLs?: Yes (appropriate for developmental age) Does the patient have difficulty walking or climbing stairs?: No Weakness of Legs: Both Weakness of Arms/Hands: None  Permission Sought/Granted                  Emotional Assessment              Admission diagnosis:  Prostatitis, unspecified  prostatitis type [N41.9] Pelvic abscess in male [K65.1] Patient Active Problem List   Diagnosis Date Noted   Elevated LFTs 03/03/2023   Pelvic abscess in male 03/01/2023   Prostatitis 03/01/2023   Type 2 diabetes mellitus with hyperglycemia, without long-term current use of insulin 02/15/2023   Weight loss, non-intentional 02/14/2023   Glucosuria 02/14/2023   Dysuria 02/14/2023   History of smoking greater than 50 pack years 02/14/2023   Establishing care with new doctor, encounter for 02/14/2023   PCP:  Ronnald Ramp, MD Pharmacy:   [facility] [phone] - [place] - [address] [place] [address] Phone: [phone] Fax: [phone]     Social Determinants of Health (SDOH) Social History: SDOH Screenings   Food Insecurity: No Food Insecurity (03/01/2023)  Housing: Low Risk  (03/01/2023)  Transportation Needs: No Transportation Needs (03/01/2023)  Utilities: Not At Risk (03/01/2023)  Alcohol Screen: Low Risk  (02/14/2023)  Depression (PHQ2-9): Low Risk  (02/14/2023)  Tobacco Use: High Risk (03/03/2023)   SDOH Interventions:     Readmission Risk Interventions     No data to display

## 2023-03-03 NOTE — Progress Notes (Signed)
Referring Physician(s): Esaw GrandchildGriffith, Kelly   Supervising Physician: Pernell DupreEl-Abd, Yasser J  [facility]  [facility]  Chief Complaint:  S/p 1 day L transgluteal pelvic abscess drain placement  Subjective:  Pt resting in bed.  He denies N/V or fevers but states he had "night sweats" overnight.  He reports significant pain relief from drain placement.  He adds that he is having surgery today prostate abscesses.  He ate dinner last night w/o difficulty.   Allergies: Patient has no known allergies.  Medications: Prior to Admission medications   Medication Sig Start Date End Date Taking? Authorizing Provider  metFORMIN (GLUCOPHAGE-XR) 500 MG 24 hr tablet Take 1 tablet (500 mg total) by mouth daily with breakfast. 02/15/23  Yes Simmons-Robinson, Makiera, MD  solifenacin (VESICARE) 5 MG tablet Take 1 tablet (5 mg total) by mouth daily. 02/22/23  Yes Jacky KindlePayne, Elise T, FNP  tamsulosin (FLOMAX) 0.4 MG CAPS capsule Take 1 capsule (0.4 mg total) by mouth 2 (two) times daily after a meal. 02/22/23  Yes Jacky KindlePayne, Elise T, FNP  Dulaglutide (TRULICITY) 0.75 MG/0.5ML SOPN Inject 0.75 mg into the skin once a week. 02/20/23   Simmons-Robinson, Tawanna CoolerMakiera, MD     Vital Signs: BP (!) 95/59 (BP Location: Right Arm)   Pulse (!) 51   Temp 97.8 F (36.6 C) (Oral)   Resp 16   Ht 6\' 1"  (1.854 m)   Wt 175 lb 0.7 oz (79.4 kg)   SpO2 97%   BMI 23.09 kg/m   Physical Exam Vitals reviewed.  Constitutional:      General: He is not in acute distress.    Appearance: Normal appearance. He is ill-appearing.  Pulmonary:     Effort: Pulmonary effort is normal. No respiratory distress.  Skin:    General: Skin is warm and dry.     Comments: Drain flushes/aspirates easily. Site unremarkable with sutures/statlock in place. ~ 20 cc purulent, serosanguineous OP in JP. Dressing C/D/I.    Neurological:     Mental Status: He is alert and oriented to person, place, and time.  Psychiatric:        Mood and Affect: Mood  normal.        Thought Content: Thought content normal.        Judgment: Judgment normal.     Imaging: CT GUIDED VISCERAL FLUID DRAIN BY PERC CATH  Result Date: 03/02/2023 INDICATION: Pelvic abscess EXAM: CT-guided drain placement into pelvic abscess MEDICATIONS: Documented in the EMR ANESTHESIA/SEDATION: Moderate (conscious) sedation was employed during this procedure. A total of Versed 2 mg and Fentanyl 100 mcg was administered intravenously by the radiology nurse. Total intra-service moderate Sedation Time: 13 minutes. The patient's level of consciousness and vital signs were monitored continuously by radiology nursing throughout the procedure under my direct supervision. COMPLICATIONS: None immediate. PROCEDURE: Informed written consent was obtained from the patient after a thorough discussion of the procedural risks, benefits and alternatives. All questions were addressed. Maximal Sterile Barrier Technique was utilized including caps, mask, sterile gowns, sterile gloves, sterile drape, hand hygiene and skin antiseptic. A timeout was performed prior to the initiation of the procedure. The patient was placed prone on the exam table. Limited CT of the abdomen and pelvis was performed for planning purposes. This demonstrated pelvic fluid collection in the posterior pelvis. Skin entry site was marked, and the overlying skin was prepped and draped in the standard sterile fashion. Local analgesia was obtained with 1% lidocaine. Using intermittent CT fluoroscopy, a 19 gauge Griffith CitronYueh  catheter was advanced into the pelvic fluid collection via a posterior left transgluteal approach. Location was confirmed with CT and return of tan colored purulent material. A sample was collected and sent for microbiology analysis. Over an 035 Amplatz wire, the percutaneous tract was serially dilated to accommodate a 12 French locking drainage catheter. Location was again confirmed with CT and return of additional purulent material.  Locking loop was formed, and the drainage catheter was secured to the skin using silk suture and a dressing. It was attached to bulb suction. The patient tolerated the procedure well without immediate complication. IMPRESSION: Successful CT-guided placement of a 12 French locking drainage catheter into the pelvic abscess via left transgluteal approach. Drainage catheter attached to bulb suction. A sample was sent for microbiology analysis. Electronically Signed   By: Olive Bass M.D.   On: 03/02/2023 16:19   DG Chest Port 1 View  Result Date: 03/01/2023 CLINICAL DATA:  Pelvic abscess EXAM: PORTABLE CHEST 1 VIEW COMPARISON:  CT done earlier today FINDINGS: The heart size and mediastinal contours are within normal limits. Both lungs are clear. The visualized skeletal structures are unremarkable. IMPRESSION: No active disease. Electronically Signed   By: Ernie Avena M.D.   On: 03/01/2023 19:45   CT Abdomen Pelvis W Contrast  Result Date: 03/01/2023 CLINICAL DATA:  Left-sided pelvic mass. EXAM: CT ABDOMEN AND PELVIS WITH CONTRAST TECHNIQUE: Multidetector CT imaging of the abdomen and pelvis was performed using the standard protocol following bolus administration of intravenous contrast. RADIATION DOSE REDUCTION: This exam was performed according to the departmental dose-optimization program which includes automated exposure control, adjustment of the mA and/or kV according to patient size and/or use of iterative reconstruction technique. CONTRAST:  OMNIPAQUE IOHEXOL 300 MG/ML  SOLN COMPARISON:  None Available. FINDINGS: Lower chest: The lung bases are clear of an acute process. No pulmonary lesions or pleural effusions. Age advanced atherosclerotic calcifications involving the aorta and coronary arteries. No pericardial effusion. Hepatobiliary: No hepatic lesions or intrahepatic biliary dilatation. The gallbladder contains large rim calcified gallstones but no findings for acute cholecystitis.  Normal caliber common bile duct. Pancreas: No mass, inflammation or ductal dilatation. Spleen: Normal size.  No focal lesions. Adrenals/Urinary Tract: The adrenal glands and kidneys are unremarkable. No renal lesions, renal calculi or hydronephrosis. The delayed images do not demonstrate any significant collecting system abnormalities. There is a Foley catheter noted in the bladder. Stomach/Bowel: The stomach, duodenum, small bowel and colon are. No acute inflammatory process, mass lesions or obstructive findings. There is a large amount of stool throughout colon and down into the rectosigmoid area suggesting constipation. Vascular/Lymphatic: Age advanced atherosclerotic calcifications involving the aorta and iliac arteries and branch vessels but no aneurysm or dissection. The major venous structures are patent. No mesenteric or retroperitoneal mass or adenopathy. Small scattered lymph nodes are noted. 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 There is also an adjacent large, 6.8 x 5.3 cm abscess in the left ischial rectal fossa. Ice in this broke out from the prostate gland. There is significant mass effect the rectum which is displaced to the right. I do not see any direct involvement of the rectum. Other: No pelvic mass or adenopathy. No free pelvic fluid collections. No inguinal mass or adenopathy. No abdominal wall hernia or subcutaneous lesions. Musculoskeletal: No significant bony findings. IMPRESSION: 1. 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 There is also an adjacent large, 6.8  x 5.3 cm abscess in the left ischial rectal fossa. 2. Cholelithiasis. 3. Age advanced atherosclerotic calcifications involving the aorta and iliac arteries and branch vessels. 4. Large amount of stool throughout the colon and down into the rectosigmoid area suggesting constipation. 5.  Aortic atherosclerosis. Aortic Atherosclerosis (ICD10-I70.0). Electronically Signed   By: Rudie Meyer M.D.   On: 03/01/2023 15:37    Labs:  CBC: Recent Labs    02/20/23 1722 03/01/23 1858 03/02/23 0504 03/03/23 0541  WBC 17.1* 17.8* 16.4* 12.9*  HGB 12.9* 12.6* 11.5* 10.8*  HCT 38.6* 38.4* 34.9* 33.1*  PLT 331 369 341 281    COAGS: Recent Labs    03/02/23 0504  INR 1.2    BMP: Recent Labs    02/20/23 1722 03/01/23 1858 03/02/23 0504 03/03/23 0541  NA 131* 130* 133* 138  K 3.9 4.6 3.8 3.9  CL 103 99 105 109  CO2 22 20* 21* 23  GLUCOSE 148* 134* 146* 130*  BUN 14 17 18 13   CALCIUM 8.8* 9.1 8.6* 8.7*  CREATININE 0.82 0.98 0.90 0.91  GFRNONAA >60 >60 >60 >60    LIVER FUNCTION TESTS: Recent Labs    02/20/23 1722 03/01/23 1954 03/02/23 0504 03/03/23 0541  BILITOT 0.8 0.8 0.6 0.8  AST 20 40 36 57*  ALT 22 48* 48* 68*  ALKPHOS 87 186* 170* 188*  PROT 8.1 8.4* 7.2 6.6  ALBUMIN 3.5 3.2* 2.6* 2.4*    Assessment:  1 day s/p L transgluteal drain for pelvic abscess with Dr. Juliette Alcide. Site C/D/I, ~130 cc  OP overnight  Pt is A&O, calm and pleasant  He is in no distress.   WBC 12.9 (16.4) VSS  Preliminary cx: ABUNDANT WBC PRESENT, PREDOMINANTLY PMN  FEW GRAM POSITIVE COCCI IN PAIRS   Output by Drain (mL) 03/01/23 0701 - 03/01/23 1900 03/01/23 1901 - 03/02/23 0700 03/02/23 0701 - 03/02/23 1900 03/02/23 1901 - 03/03/23 0700 03/03/23 0701 - 03/03/23 0902  Closed System Drain 1 Left Buttock 10 Fr.   50 60    Plan:  Continue TID flushes with 5 cc NS. Record output Q shift. Dressing changes QD or PRN if soiled.  Call IR APP or on call IR MD if difficulty flushing or sudden change in drain output.  Repeat imaging/possible drain injection once output < 10 mL/QD (excluding flush material.)   Discharge planning: Please contact IR APP or on call IR MD prior to patient d/c to ensure appropriate follow up plans are in place. Typically patient will follow up with  IR clinic 10-14 days post d/c for repeat imaging/possible drain injection. IR scheduler will contact patient with date/time of appointment. Patient will need to flush drain QD with 5 cc NS, record output QD, dressing changes every 2-3 days or earlier if soiled.    IR will continue to follow - please call with questions or concerns.   Electronically Signed: Shon Hough, NP 03/03/2023, 8:54 AM   I spent a total of 15 Minutes at the the patient's bedside AND on the patient's hospital floor or unit, greater than 50% of which was counseling/coordinating care for pelvic abscess.

## 2023-03-03 NOTE — Anesthesia Procedure Notes (Signed)
Procedure Name: LMA Insertion Date/Time: 03/03/2023 1:29 PM  Performed by: Morene Crocker, CRNAPre-anesthesia Checklist: Patient identified, Patient being monitored, Timeout performed, Emergency Drugs available and Suction available Patient Re-evaluated:Patient Re-evaluated prior to induction Oxygen Delivery Method: Circle system utilized Preoxygenation: Pre-oxygenation with 100% oxygen Induction Type: IV induction Ventilation: Mask ventilation without difficulty LMA: LMA inserted LMA Size: 4.0 Tube type: Oral Number of attempts: 1 Placement Confirmation: positive ETCO2 and breath sounds checked- equal and bilateral Tube secured with: Tape Dental Injury: Teeth and Oropharynx as per pre-operative assessment  Comments: Smooth atraumatic LMA placement

## 2023-03-03 NOTE — Progress Notes (Signed)
Progress Note   Patient: Colin Griffin ZOX:[id number] DOB: 1951/01/13 DOA: 03/01/2023     2 DOS: the patient was seen and examined on 03/03/2023   Brief hospital course: HPI on admission 03/01/2023: "Nasheed Pacifico is a 72 y.o. male with a known history of diabetes diagnosed 1 month ago presents to the ED for evaluation of pelvic abscess.  Patient was in a usual state of health until 1 month ago he reports that he was diagnosed with a urinary tract infection for which she completed a course of Bactrim.  He was seen in the ED about 10 days ago for urinary retention and Foley catheter was placed.  He followed up with urology today for a CT scan which showed a small abscess in the prostate and a larger pelvic abscess.  He was referred by urology to the ED for admission for IR drainage of the abscess as well as IV antibiotics.   Of note he reports 25 pound weight loss in the past 2 months since being diagnosed with diabetes.  He has made significant changes to his diet. ...   Patient was started on IV Vancomycin and Zosyn, admitted to medicine service with general surgery and urology consulted.  IR consulted for CT-guided drainage and obtain cultures.  Assessment and Plan: * Pelvic abscess in male CT abdomen/pelvis showed a large 6.8 x 5.3 cm abscess in the left ischial rectal fossa in addition to multiple prostate gland abscesses. --General surgery and IR consulted --s/p CT-guided drainage with IR on 4/4 --Follow abscess cultures - growing staph aureus with sensitivities pending --Follow blood cultures --Pain control --Continue IV Vanc/Zosyn pending cultures  Prostatitis Urinary retention Urology following. Has Foley since recent ED encounter  Undergoing (TURP)/ UNROOFING OF PROSTATE ABSCESS  today with Dr. Lonna Cobb.  Type 2 diabetes mellitus with hyperglycemia, without long-term current use of insulin Recently diagnosed.  Hbg A1c is 11.1%. He has changed diet dramatically and is losing  weight.  Sugars he reports fairly well controlled. --Sliding scale Novolog coverage --Diabetes education appreciated --Continue diet and lifestyle efforts --Inpatient CBG goal is 140-180        Subjective: Pt seen before going for procedure with urology this AM.  He reports poor sleep overnight.  Pain is fairly well controlled.  Had some night sweats but no fever/chills this AM.   Physical Exam: Vitals:   03/02/23 2007 03/03/23 0417 03/03/23 0853 03/03/23 1217  BP: 130/62 (!) 95/59 102/61 108/64  Pulse: 66 (!) 51 (!) 53 (!) 54  Resp: 16 16 16 17   Temp: 98 F (36.7 C) 97.8 F (36.6 C) 98.4 F (36.9 C) 98.4 F (36.9 C)  TempSrc: Oral Oral  Temporal  SpO2: 100% 97% 98% 100%  Weight:      Height:       General exam: awake, appears drowsy, no acute distress HEENT: moist mucus membranes, hearing grossly normal  Respiratory system: CTAB, no wheezes, rales or rhonchi, normal respiratory effort. Cardiovascular system: normal S1/S2,  RRR, no pedal edema.   Gastrointestinal system: soft, NT, ND Central nervous system: A&O x. no gross focal neurologic deficits, normal speech Extremities: moves all , no edema, normal tone Skin: dry, intact, normal temperature Psychiatry: normal mood, congruent affect, judgement and insight appear normal   Data Reviewed:  Notable labs ---  glucose 130, Ca 8.7, Alk phos 188, albumin 2.4, AST 57, ALT 68, normal Tbili, WBC improved to 12.9, Hbg 10.8 stable.  Micro ---  Blood cultures -- pending - negative  to date Pelvic abscess cultures --- growing abundant Staph aureus, susceptibilities pending  Family Communication:  None present on rounds. Pt is able to update.  Disposition: Status is: Inpatient Remains inpatient appropriate because: remains on IV broad spectrum antibiotics pending cultures, additional procedure/s needed as above.   Planned Discharge Destination: Home    Time spent: 42 minutes  Author: Pennie Banter, DO 03/03/2023  2:18 PM  For on call review [email or web address].

## 2023-03-03 NOTE — Transfer of Care (Signed)
Immediate Anesthesia Transfer of Care Note  Patient: Norvell Gene Pulis  Procedure(s) Performed: TRANSURETHRAL RESECTION OF THE PROSTATE (TURP)/ UNROOFING OF PROSTATE ABSCESS (Prostate)  Patient Location: PACU  Anesthesia Type:General  Level of Consciousness: drowsy  Airway & Oxygen Therapy: Patient Spontanous Breathing and Patient connected to face mask oxygen  Post-op Assessment: Report given to RN and Post -op Vital signs reviewed and stable  Post vital signs: Reviewed and stable  Last Vitals:  Vitals Value Taken Time  BP 132/76 03/03/23 1432  Temp 35.9 1432  Pulse 75 03/03/23 1434  Resp 16 03/03/23 1434  SpO2 99 % 03/03/23 1434  Vitals shown include unvalidated device data.  Last Pain:  Vitals:   03/03/23 1217  TempSrc: Temporal  PainSc: 0-No pain      Patients Stated Pain Goal: 3 (03/02/23 0117)  Complications: No notable events documented.

## 2023-03-03 NOTE — Progress Notes (Addendum)
Pharmacy Antibiotic Note  Colin Griffin is a 72 y.o. male with PMH including recently diagnosed diabetes admitted on 03/01/2023 with pelvic / prostatic abscess.  Pharmacy has been consulted for Zosyn & vancomycin dosing.  4/4 CT drain placement into pelvic abscess 4/5 Transurethral resection of the prostate  Plan: Zosyn 3.375g IV q8h (4 hour infusion).  Continue vancomycin 1 g IV q12h --Goal AUC 400-550 --Expected AUC: 461, Cmin 12.7 --SCr used: 0.91, TBW --Daily Scr per protocol --Levels at steady state or as clinically indicated, per discussion with MD, anticipate possible conversion to PO antibiotics in next 24 - 48h. Given stability of Scr and WBC trending down, will hold off on ordering levels for now.  Pharmacy will continue to follow and will adjust abx dosing whenever warranted.  Temp (24hrs), Avg:98.1 F (36.7 C), Min:97.5 F (36.4 C), Max:98.7 F (37.1 C)   Recent Labs  Lab 03/01/23 1858 03/01/23 2138 03/02/23 0114 03/02/23 0504 03/03/23 0541  WBC 17.8*  --   --  16.4* 12.9*  CREATININE 0.98  --   --  0.90 0.91  LATICACIDVEN  --  1.6 1.1  --   --      Estimated Creatinine Clearance: 86 mL/min (by C-G formula based on SCr of 0.91 mg/dL).    No Known Allergies  Antimicrobials this admission: 4/3 Zosyn >>  4/3 Vancomycin >>  Microbiology results: 4/3 BCx: NGTD 4/5 Abscess Cx: Staphylococcus aureus  Thank you for allowing pharmacy to be a part of this patient's care.  Tressie Ellis  03/03/2023 4:06 PM

## 2023-03-03 NOTE — Interval H&P Note (Signed)
History and Physical Interval Note:  CV:RRR Lungs:clear  03/03/2023 1:26 PM  Colin Griffin  has presented today for surgery, with the diagnosis of Prostate Abscess.  The various methods of treatment have been discussed with the patient and family. After consideration of risks, benefits and other options for treatment, the patient has consented to  Procedure(s): TRANSURETHRAL RESECTION OF THE PROSTATE (TURP)/ UNROOFING OF PROSTATE ABSCESS (N/A) as a surgical intervention.  The patient's history has been reviewed, patient examined, no change in status, stable for surgery.  I have reviewed the patient's chart and labs.  Questions were answered to the patient's satisfaction.     Diron Haddon C Holly Iannaccone

## 2023-03-03 NOTE — Progress Notes (Signed)
Order received for a carb modified diet

## 2023-03-03 NOTE — Care Management Important Message (Signed)
Important Message  Patient Details  Name: Lenis Mohl MRN: [id number] Date of Birth: 27-Jul-1951   Medicare Important Message Given:  Yes     Johnell Comings 03/03/2023, 11:15 AM

## 2023-03-03 NOTE — Anesthesia Preprocedure Evaluation (Addendum)
Anesthesia Evaluation  Patient identified by MRN, date of birth, ID band Patient awake    Reviewed: Allergy & Precautions, H&P , NPO status , Patient's Chart, lab work & pertinent test results  Airway Mallampati: II  TM Distance: >3 FB Neck ROM: full    Dental no notable dental hx.    Pulmonary Current Smoker and Patient abstained from smoking.   Pulmonary exam normal        Cardiovascular negative cardio ROS Normal cardiovascular exam     Neuro/Psych negative neurological ROS  negative psych ROS   GI/Hepatic negative GI ROS, Neg liver ROS,,,  Endo/Other  diabetes, Poorly Controlled, Type 2  A1c 11  Renal/GU      Musculoskeletal   Abdominal Normal abdominal exam  (+)   Peds  Hematology  (+) Blood dyscrasia, anemia   Anesthesia Other Findings Pt admitted with Prostatic abscess and has undergone successful CT-guided drain placement to pelvic abscess yesterday.   Past Medical History: No date: Pre-diabetes  Past Surgical History: No date: APPENDECTOMY  BMI    Body Mass Index: 23.09 kg/m      Reproductive/Obstetrics negative OB ROS                              Anesthesia Physical Anesthesia Plan  ASA: 3  Anesthesia Plan: General ETT   Post-op Pain Management: Tylenol PO (pre-op)* and Precedex   Induction: Intravenous  PONV Risk Score and Plan: 2 and Ondansetron, Dexamethasone and Midazolam  Airway Management Planned: Oral ETT  Additional Equipment:   Intra-op Plan:   Post-operative Plan: Extubation in OR  Informed Consent: I have reviewed the patients History and Physical, chart, labs and discussed the procedure including the risks, benefits and alternatives for the proposed anesthesia with the patient or authorized representative who has indicated his/her understanding and acceptance.     Dental Advisory Given  Plan Discussed with: CRNA and Surgeon  Anesthesia  Plan Comments:          Anesthesia Quick Evaluation

## 2023-03-03 NOTE — Progress Notes (Signed)
Patient transported to the OR 

## 2023-03-03 NOTE — Op Note (Signed)
   Preoperative diagnosis: Prostatic abscess  Postoperative diagnosis:  Same  Procedure:  Cystoscopy Transurethral resection of the prostate  Surgeon: Lorin Picket C. Anjannette Gauger M.D.  Anesthesia: General  Complications: None  EBL: Minimal  Findings: No abscess cavity with multiple pustular areas lateral lobes bilaterally.    Specimens: Prostate chips   Indication: Colin Griffin is a 72 year old male with a large left ischio-rectal fossa abscess and prostatic abscess.  Treated for a Staph aureus UTI in mid March and subsequently developed urinary retention 1 week later.  He underwent placement of a percutaneous drain to his pelvic abscess by IR yesterday.  CT performed earlier this week showed bilateral abscess cavities adjacent to the prostatic urethra.  He presents for TURP.  After reviewing the management options for treatment, he elected to proceed with the above surgical procedure(s). We have discussed the potential benefits and risks of the procedure, side effects of the proposed treatment, the likelihood of the patient achieving the goals of the procedure, and any potential problems that might occur during the procedure or recuperation. Informed consent has been obtained.  Description of procedure:  The patient was taken to the operating room and general anesthesia was induced.  The patient was placed in the dorsal lithotomy position, prepped and draped in the usual sterile fashion, and preoperative antibiotics were administered. A preoperative time-out was performed.   A 24 French continuous-flow resectoscope sheath was lubricated and passed per urethra.  An Iglesias resectoscope with loop was then placed into the sheath.  The urethra was normal in caliber without stricture.  Prostate with mild lateral lobe enlargement.  No lateral lobe bulging noted.  Bladder neck not elevated.  The bladder was then systematically examined in its entirety. There was no evidence of any bladder  tumors, stones, or other mucosal pathology.  Mild mucosal erythema secondary to previous indwelling Foley  The ureteral orifices were identified and were well away from the bladder neck  Attention was directed to the left lateral lobe which was resected.  No discernible abscess cavity was identified though in the mid/distal portion of the lateral lobe multiple pustular areas were identified which were completely resected.  Hemostasis was obtained with cautery.  The right lateral lobe was resected in a similar fashion and again no discernible abscess cavity notified but multiple pustular areas which were completely resected.  Hemostasis was obtained with bipolar cautery  All chips were removed via irrigation.  With the resectoscope on minimal flow no bleeding was noted.  A 20 French two-way Foley catheter was placed and 50 cc of sterile water was instilled into the balloon.  The catheter was irrigated with return of clear effluent.  The patient appeared to tolerate the procedure well and without complications.  After anesthetic reversal he was transported to the PACU in satisfactory condition.    Recommendation: Indwelling catheter x 3-4 days.  If ready for discharge tomorrow can send home with indwelling Foley and follow-up in office for catheter removal Recommend 3-4 weeks of culture specific oral antibiotic therapy   Irineo Axon, MD

## 2023-03-04 DIAGNOSIS — K5900 Constipation, unspecified: Secondary | ICD-10-CM | POA: Diagnosis present

## 2023-03-04 DIAGNOSIS — K651 Peritoneal abscess: Secondary | ICD-10-CM | POA: Diagnosis not present

## 2023-03-04 HISTORY — DX: Constipation, unspecified: K59.00

## 2023-03-04 LAB — COMPREHENSIVE METABOLIC PANEL
ALT: 65 U/L — ABNORMAL HIGH (ref 0–44)
AST: 46 U/L — ABNORMAL HIGH (ref 15–41)
Albumin: 2.4 g/dL — ABNORMAL LOW (ref 3.5–5.0)
Alkaline Phosphatase: 167 U/L — ABNORMAL HIGH (ref 38–126)
Anion gap: 7 (ref 5–15)
BUN: 11 mg/dL (ref 8–23)
CO2: 24 mmol/L (ref 22–32)
Calcium: 8.5 mg/dL — ABNORMAL LOW (ref 8.9–10.3)
Chloride: 106 mmol/L (ref 98–111)
Creatinine, Ser: 0.83 mg/dL (ref 0.61–1.24)
GFR, Estimated: >60 mL/min (ref >60)
Glucose, Bld: 184 mg/dL — ABNORMAL HIGH (ref 70–99)
Potassium: 3.9 mmol/L (ref 3.5–5.1)
Sodium: 137 mmol/L (ref 135–145)
Total Bilirubin: 0.6 mg/dL (ref 0.3–1.2)
Total Protein: 6.5 g/dL (ref 6.5–8.1)

## 2023-03-04 LAB — CBC
HCT: 31.7 % — ABNORMAL LOW (ref 39.0–52.0)
Hemoglobin: 10.5 g/dL — ABNORMAL LOW (ref 13.0–17.0)
MCH: 30.3 pg (ref 26.0–34.0)
MCHC: 33.1 g/dL (ref 30.0–36.0)
MCV: 91.4 fL (ref 80.0–100.0)
Platelets: 310 10*3/uL (ref 150–400)
RBC: 3.47 MIL/uL — ABNORMAL LOW (ref 4.22–5.81)
RDW: 12.4 % (ref 11.5–15.5)
WBC: 13.2 10*3/uL — ABNORMAL HIGH (ref 4.0–10.5)
nRBC: 0 % (ref 0.0–0.2)

## 2023-03-04 LAB — GLUCOSE, CAPILLARY
Glucose-Capillary: 191 mg/dL — ABNORMAL HIGH (ref 70–99)
Glucose-Capillary: 210 mg/dL — ABNORMAL HIGH (ref 70–99)
Glucose-Capillary: 176 mg/dL — ABNORMAL HIGH (ref 70–99)
Glucose-Capillary: 144 mg/dL — ABNORMAL HIGH (ref 70–99)

## 2023-03-04 NOTE — Progress Notes (Signed)
Progress Note   Patient: Colin Griffin OVZ:[id number] DOB: 06-Dec-1950 DOA: 03/01/2023     3 DOS: the patient was seen and examined on 03/04/2023   Brief hospital course: HPI on admission 03/01/2023: "[name] is a 72 y.o. male with a known history of diabetes diagnosed 1 month ago presents to the ED for evaluation of pelvic abscess.  Patient was in a usual state of health until 1 month ago he reports that he was diagnosed with a urinary tract infection for which she completed a course of Bactrim.  He was seen in the ED about 10 days ago for urinary retention and Foley catheter was placed.  He followed up with urology today for a CT scan which showed a small abscess in the prostate and a larger pelvic abscess.  He was referred by urology to the ED for admission for IR drainage of the abscess as well as IV antibiotics.   Of note he reports 25 pound weight loss in the past 2 months since being diagnosed with diabetes.  He has made significant changes to his diet. ...   Patient was started on IV Vancomycin and Zosyn, admitted to medicine service with general surgery and urology consulted.  IR consulted for CT-guided drainage and obtain cultures.  Assessment and Plan: * Pelvic abscess in male CT abdomen/pelvis showed a large 6.8 x 5.3 cm abscess in the left ischial rectal fossa in addition to multiple prostate gland abscesses. --General surgery and IR consulted --s/p CT-guided drainage with IR on 4/4 --Monitor drain output --Follow abscess cultures - growing staph aureus with sensitivities pending --Follow blood cultures --Pain control --Continue IV Vanc/Zosyn pending cultures  Constipation Bowel regimen per orders  Prostatitis Urinary retention Urology following. Has Foley since recent ED encounter  Status post (TURP)/ UNROOFING OF PROSTATE ABSCESS  on 03/03/23 with Dr. Lonna Cobb. --Follow urology recommendations  Type 2 diabetes mellitus with hyperglycemia, without long-term current  use of insulin Recently diagnosed.  Hbg A1c is 11.1%. He has changed diet dramatically and is losing weight.  Sugars he reports fairly well controlled. --Sliding scale Novolog coverage --Diabetes education appreciated --Continue diet and lifestyle efforts --Inpatient CBG goal is 140-180        Subjective: Pt seen before going for procedure with urology this AM.  He reports poor sleep overnight.  Pain is fairly well controlled.  Had some night sweats but no fever/chills this AM.   Physical Exam: Vitals:   03/03/23 1710 03/03/23 1949 03/04/23 0415 03/04/23 0817  BP: 128/72 115/74 112/66 112/61  Pulse: 73 70 (!) 51 (!) 50  Resp:  20 16 20   Temp: 98.3 F (36.8 C) 97.7 F (36.5 C) 97.6 F (36.4 C) 97.6 F (36.4 C)  TempSrc:  Oral Oral   SpO2: 97% 99% 100% 99%  Weight:      Height:       General exam: awake, appears drowsy, no acute distress HEENT: moist mucus membranes, hearing grossly normal  Respiratory system: CTAB, no wheezes, rales or rhonchi, normal respiratory effort. Cardiovascular system: normal S1/S2,  RRR, no pedal edema.   Gastrointestinal system: soft, NT, ND Central nervous system: A&O x. no gross focal neurologic deficits, normal speech Extremities: moves all , no edema, normal tone Skin: dry, intact, normal temperature Psychiatry: normal mood, congruent affect, judgement and insight appear normal   Data Reviewed:  Notable labs ---  glucose 130, Ca 8.7, Alk phos 188, albumin 2.4, AST 57, ALT 68, normal Tbili, WBC improved to 12.9, Hbg  10.8 stable.  Micro ---  Blood cultures -- pending - negative to date Pelvic abscess cultures --- growing abundant Staph aureus, susceptibilities pending  Family Communication:  None present on rounds. Pt is able to update.  Disposition: Status is: Inpatient Remains inpatient appropriate because: remains on IV broad spectrum antibiotics pending cultures, additional procedure/s needed as above.   Planned Discharge  Destination: Home    Time spent: 36 minutes  Author: Pennie Banter, DO 03/04/2023 2:51 PM  For on call review [email or web address].

## 2023-03-04 NOTE — Assessment & Plan Note (Signed)
Bowel regimen per orders 

## 2023-03-05 DIAGNOSIS — K651 Peritoneal abscess: Secondary | ICD-10-CM | POA: Diagnosis not present

## 2023-03-05 LAB — COMPREHENSIVE METABOLIC PANEL
ALT: 76 U/L — ABNORMAL HIGH (ref 0–44)
AST: 53 U/L — ABNORMAL HIGH (ref 15–41)
Albumin: 2.6 g/dL — ABNORMAL LOW (ref 3.5–5.0)
Alkaline Phosphatase: 155 U/L — ABNORMAL HIGH (ref 38–126)
Anion gap: 7 (ref 5–15)
BUN: 12 mg/dL (ref 8–23)
CO2: 25 mmol/L (ref 22–32)
Calcium: 8.8 mg/dL — ABNORMAL LOW (ref 8.9–10.3)
Chloride: 106 mmol/L (ref 98–111)
Creatinine, Ser: 0.89 mg/dL (ref 0.61–1.24)
GFR, Estimated: >60 mL/min (ref >60)
Glucose, Bld: 161 mg/dL — ABNORMAL HIGH (ref 70–99)
Potassium: 3.9 mmol/L (ref 3.5–5.1)
Sodium: 138 mmol/L (ref 135–145)
Total Bilirubin: 0.7 mg/dL (ref 0.3–1.2)
Total Protein: 6.4 g/dL — ABNORMAL LOW (ref 6.5–8.1)

## 2023-03-05 LAB — GLUCOSE, CAPILLARY
Glucose-Capillary: 137 mg/dL — ABNORMAL HIGH (ref 70–99)
Glucose-Capillary: 210 mg/dL — ABNORMAL HIGH (ref 70–99)
Glucose-Capillary: 118 mg/dL — ABNORMAL HIGH (ref 70–99)
Glucose-Capillary: 206 mg/dL — ABNORMAL HIGH (ref 70–99)

## 2023-03-05 LAB — CBC
HCT: 31.9 % — ABNORMAL LOW (ref 39.0–52.0)
Hemoglobin: 10.4 g/dL — ABNORMAL LOW (ref 13.0–17.0)
MCH: 30 pg (ref 26.0–34.0)
MCHC: 32.6 g/dL (ref 30.0–36.0)
MCV: 91.9 fL (ref 80.0–100.0)
Platelets: 305 10*3/uL (ref 150–400)
RBC: 3.47 MIL/uL — ABNORMAL LOW (ref 4.22–5.81)
RDW: 12.6 % (ref 11.5–15.5)
WBC: 11.4 10*3/uL — ABNORMAL HIGH (ref 4.0–10.5)
nRBC: 0 % (ref 0.0–0.2)

## 2023-03-05 MED ORDER — CEFAZOLIN SODIUM-DEXTROSE 2-4 GM/100ML-% IV SOLN
2.0000 g | Freq: Three times a day (TID) | INTRAVENOUS | Status: DC
  Administered 2023-03-05 – 2023-03-07 (×7): 2 g via INTRAVENOUS
  Filled 2023-03-05 (×8): qty 100

## 2023-03-05 NOTE — Discharge Instructions (Signed)

## 2023-03-05 NOTE — Progress Notes (Signed)
Progress Note   Patient: Colin Griffin YZJ:[id number] DOB: 10/27/1951 DOA: 03/01/2023     4 DOS: the patient was seen and examined on 03/05/2023   Brief hospital course: HPI on admission 03/01/2023: "Colin Griffin is a 72 y.o. male with a known history of diabetes diagnosed 1 month ago presents to the ED for evaluation of pelvic abscess.  Patient was in a usual state of health until 1 month ago he reports that he was diagnosed with a urinary tract infection for which she completed a course of Bactrim.  He was seen in the ED about 10 days ago for urinary retention and Foley catheter was placed.  He followed up with urology today for a CT scan which showed a small abscess in the prostate and a larger pelvic abscess.  He was referred by urology to the ED for admission for IR drainage of the abscess as well as IV antibiotics.   Of note he reports 25 pound weight loss in the past 2 months since being diagnosed with diabetes.  He has made significant changes to his diet. ...   Patient was started on IV Vancomycin and Zosyn, admitted to medicine service with general surgery and urology consulted.  IR consulted for CT-guided drainage and obtain cultures.  Assessment and Plan: * Pelvic abscess in male CT abdomen/pelvis showed a large 6.8 x 5.3 cm abscess in the left ischial rectal fossa in addition to multiple prostate gland abscesses. Abscess cultures grew MSSA. --General surgery and IR consulted --s/p CT-guided drainage with IR on 4/4 --On IV Vanc/Zosyn since admission --De-escalate IV antibiotics to Ancef --Follow blood cultures --Monitor drain output --Pain control  Constipation Bowel regimen per orders  Elevated LFTs Mildly elevated Alk phos, AST, ALT. Normal bili. Asyptomatic, no abdominal pain or N/V. Suspect due to infection --Monitor CMP --LFT's were normal on 02/20/23  Prostatitis Urinary retention Urology following. Has Foley since recent ED encounter  Status post (TURP)/  UNROOFING OF PROSTATE ABSCESS  on 03/03/23 with Dr. Lonna Cobb. --Urology following - see their recs --Foley for 3-4 days post-procedure - defer to urology --Recommend 3-4 weeks of PO antibiotics at d/c  Type 2 diabetes mellitus with hyperglycemia, without long-term current use of insulin Recently diagnosed.  Hbg A1c is 11.1%. He has changed diet dramatically and is losing weight.  Sugars he reports fairly well controlled. --Sliding scale Novolog coverage --Diabetes education appreciated --Continue diet and lifestyle efforts --Inpatient CBG goal is 140-180        Subjective: Pt seen awake resting in bed today. Reports a rough night with some left hip pain.  States he had a good BM yesterday finally.  Otherwise feeling okay, pain fairly well controlled.   Physical Exam: Vitals:   03/04/23 1537 03/04/23 2008 03/05/23 0431 03/05/23 0719  BP: 104/64 123/64 112/63 (!) 113/58  Pulse: 66 60 (!) 59 (!) 49  Resp: 20 20 20 16   Temp: 97.9 F (36.6 C) 98.6 F (37 C) 97.9 F (36.6 C) 97.6 F (36.4 C)  TempSrc:  Oral Oral   SpO2: 98% 98% 99% 97%  Weight:      Height:       General exam: awake, appears drowsy, no acute distress HEENT: moist mucus membranes, hearing grossly normal  Respiratory system: CTAB, no wheezes, rales or rhonchi, normal respiratory effort. Cardiovascular system: normal S1/S2,  RRR, no pedal edema.   Gastrointestinal system: soft, NT, ND Central nervous system: A&O x. no gross focal neurologic deficits, normal speech Extremities: moves all ,  no edema, normal tone Skin: dry, intact, normal temperature Psychiatry: normal mood, congruent affect, judgement and insight appear normal  JP drain with minimal serosanguinous appearing fluid  Data Reviewed:  Notable labs ---  glucose 161, Ca 8.8, Alkphos 155, AST 53 from 46, ALT 76 from 65, WBC improving 11.3 from 13.2, Hbg stable 10.4  Micro ---  Blood cultures -- pending - negative to date Pelvic abscess cultures ---  MSSA (resistant only to Cipro)  Family Communication:  None present on rounds. Pt is able to update.  Disposition: Status is: Inpatient Remains inpatient appropriate because: remains on IV broad spectrum antibiotics pending cultures, additional procedure/s needed as above.   Planned Discharge Destination: Home    Time spent: 36 minutes  Author: Pennie Banter, DO 03/05/2023 12:03 PM  For on call review [email or web address].

## 2023-03-05 NOTE — Progress Notes (Signed)
Nutrition Quick Note:   MD consult for DM diet education. RD attempted to call room to provide education over the phone. However, no answer. RD will attach handout to discharge paperwork and attempt to provide in-person education if time allows.   Bethann Humble, RD, LDN, CNSC.

## 2023-03-05 NOTE — Progress Notes (Signed)
2 Days Post-Op  Subjective: Colin Griffin is doing well following TURP and IR drainage of a pelvic abscess.   His urine is clear and he is feeling much better without fever or pain.  ROS:  Review of Systems  All other systems reviewed and are negative.   Anti-infectives: Anti-infectives (From admission, onward)    Start     Dose/Rate Route Frequency Ordered Stop   03/05/23 0930  ceFAZolin (ANCEF) IVPB 2g/100 mL premix        2 g 200 mL/hr over 30 Minutes Intravenous Every 8 hours 03/05/23 0834     03/02/23 1000  vancomycin (VANCOCIN) IVPB 1000 mg/200 mL premix  Status:  Discontinued        1,000 mg 200 mL/hr over 60 Minutes Intravenous Every 12 hours 03/01/23 2206 03/05/23 0819   03/01/23 2200  piperacillin-tazobactam (ZOSYN) IVPB 3.375 g  Status:  Discontinued        3.375 g 12.5 mL/hr over 240 Minutes Intravenous Every 8 hours 03/01/23 2154 03/05/23 0819   03/01/23 2200  vancomycin (VANCOREADY) IVPB 1750 mg/350 mL        1,750 mg 175 mL/hr over 120 Minutes Intravenous  Once 03/01/23 2155 03/02/23 0100   03/01/23 2115  piperacillin-tazobactam (ZOSYN) IVPB 3.375 g  Status:  Discontinued        3.375 g 100 mL/hr over 30 Minutes Intravenous  Once 03/01/23 2111 03/01/23 2154   03/01/23 2115  vancomycin (VANCOCIN) IVPB 1000 mg/200 mL premix  Status:  Discontinued        1,000 mg 200 mL/hr over 60 Minutes Intravenous  Once 03/01/23 2111 03/01/23 2155       Current Facility-Administered Medications  Medication Dose Route Frequency Provider Last Rate Last Admin   0.9 %  sodium chloride infusion   Intravenous Continuous Hugelmeyer, Alexis, DO 75 mL/hr at 03/04/23 0123 Infusion Verify at 03/04/23 0123   0.9 %  sodium chloride infusion   Intravenous PRN Esaw Grandchild A, DO       acetaminophen (TYLENOL) tablet 650 mg  650 mg Oral Q6H PRN Hugelmeyer, Alexis, DO       Or   acetaminophen (TYLENOL) suppository 650 mg  650 mg Rectal Q6H PRN Hugelmeyer, Alexis, DO       bisacodyl (DULCOLAX)  EC tablet 5 mg  5 mg Oral Daily PRN Hugelmeyer, Alexis, DO   5 mg at 03/03/23 2148   ceFAZolin (ANCEF) IVPB 2g/100 mL premix  2 g Intravenous Q8H Nazari, Walid A, RPH 200 mL/hr at 03/05/23 1023 2 g at 03/05/23 1023   Chlorhexidine Gluconate Cloth 2 % PADS 6 each  6 each Topical Q0600 Hugelmeyer, Alexis, DO   6 each at 03/05/23 0533   enoxaparin (LOVENOX) injection 40 mg  40 mg Subcutaneous Q24H Alex Gardener T, NP   40 mg at 03/05/23 1026   hydrALAZINE (APRESOLINE) injection 5 mg  5 mg Intravenous Q6H PRN Hugelmeyer, Alexis, DO       HYDROcodone-acetaminophen (NORCO/VICODIN) 5-325 MG per tablet 1-2 tablet  1-2 tablet Oral Q4H PRN Hugelmeyer, Alexis, DO   2 tablet at 03/05/23 0135   insulin aspart (novoLOG) injection 0-15 Units  0-15 Units Subcutaneous TID WC Hugelmeyer, Alexis, DO   2 Units at 03/05/23 0833   insulin aspart (novoLOG) injection 0-5 Units  0-5 Units Subcutaneous QHS Hugelmeyer, Alexis, DO   3 Units at 03/03/23 2149   morphine (PF) 2 MG/ML injection 1 mg  1 mg Intravenous Q6H PRN Hugelmeyer, Alexis, DO  1 mg at 03/03/23 1536   ondansetron (ZOFRAN) tablet 4 mg  4 mg Oral Q6H PRN Hugelmeyer, Alexis, DO       Or   ondansetron (ZOFRAN) injection 4 mg  4 mg Intravenous Q6H PRN Hugelmeyer, Alexis, DO       polyethylene glycol (MIRALAX / GLYCOLAX) packet 17 g  17 g Oral Daily Esaw Grandchild A, DO   17 g at 03/04/23 1154   senna-docusate (Senokot-S) tablet 1 tablet  1 tablet Oral BID Esaw Grandchild A, DO   1 tablet at 03/04/23 2145   sodium chloride flush (NS) 0.9 % injection 3 mL  3 mL Intravenous Q12H Hugelmeyer, Alexis, DO   3 mL at 03/04/23 2146   sodium chloride flush (NS) 0.9 % injection 5 mL  5 mL Intracatheter Q8H El-Abd, Aaron Edelman, MD   5 mL at 03/05/23 0533   traZODone (DESYREL) tablet 50 mg  50 mg Oral QHS PRN Esaw Grandchild A, DO   50 mg at 03/03/23 2148     Objective: Vital signs in last 24 hours: Temp:  [97.6 F (36.4 C)-98.6 F (37 C)] 97.6 F (36.4 C) (04/07  0719) Pulse Rate:  [49-66] 49 (04/07 0719) Resp:  [16-20] 16 (04/07 0719) BP: (104-123)/(58-64) 113/58 (04/07 0719) SpO2:  [97 %-99 %] 97 % (04/07 0719)  Intake/Output from previous day: 04/06 0701 - 04/07 0700 In: 1110 [P.O.:300; I.V.:10; IV Piggyback:800] Out: 8675 [QGBEE:1007; Drains:20] Intake/Output this shift: Total I/O In: -  Out: 1000 [Urine:1000]   Physical Exam Vitals reviewed.  Constitutional:      Appearance: Normal appearance.  Neurological:     Mental Status: He is alert.    Urine is clear.  Lab Results:  Recent Labs    03/04/23 0455 03/05/23 0443  WBC 13.2* 11.4*  HGB 10.5* 10.4*  HCT 31.7* 31.9*  PLT 310 305   BMET Recent Labs    03/04/23 0455 03/05/23 0443  NA 137 138  K 3.9 3.9  CL 106 106  CO2 24 25  GLUCOSE 184* 161*  BUN 11 12  CREATININE 0.83 0.89  CALCIUM 8.5* 8.8*   PT/INR No results for input(s): "LABPROT", "INR" in the last 72 hours. ABG No results for input(s): "PHART", "HCO3" in the last 72 hours.  Invalid input(s): "PCO2", "PO2"  Studies/Results: No results found.   Assessment and Plan: Prostatic and pelvic abscess:  He is doing well s/p TURP and IR drainage of pelvic abscess.   His urine is clear.   Foley removal per Dr. Lonna Cobb.       LOS: 4 days    Colin Griffin 4/7/2024Patient ID: Colin Griffin, male   DOB: 11/30/1950, 72 y.o.   MRN: [id number]

## 2023-03-05 NOTE — Assessment & Plan Note (Signed)
Mildly elevated Alk phos, AST, ALT. Normal bili. Asyptomatic, no abdominal pain or N/V. Suspect due to infection --Monitor CMP --LFT's were normal on 02/20/23

## 2023-03-06 DIAGNOSIS — K651 Peritoneal abscess: Secondary | ICD-10-CM | POA: Diagnosis not present

## 2023-03-06 LAB — COMPREHENSIVE METABOLIC PANEL
ALT: 65 U/L — ABNORMAL HIGH (ref 0–44)
AST: 40 U/L (ref 15–41)
Albumin: 2.7 g/dL — ABNORMAL LOW (ref 3.5–5.0)
Alkaline Phosphatase: 148 U/L — ABNORMAL HIGH (ref 38–126)
Anion gap: 6 (ref 5–15)
BUN: 12 mg/dL (ref 8–23)
CO2: 29 mmol/L (ref 22–32)
Calcium: 9 mg/dL (ref 8.9–10.3)
Chloride: 105 mmol/L (ref 98–111)
Creatinine, Ser: 0.8 mg/dL (ref 0.61–1.24)
GFR, Estimated: >60 mL/min (ref >60)
Glucose, Bld: 132 mg/dL — ABNORMAL HIGH (ref 70–99)
Potassium: 4.4 mmol/L (ref 3.5–5.1)
Sodium: 140 mmol/L (ref 135–145)
Total Bilirubin: 0.5 mg/dL (ref 0.3–1.2)
Total Protein: 6.9 g/dL (ref 6.5–8.1)

## 2023-03-06 LAB — CULTURE, BLOOD (ROUTINE X 2)
Culture: NO GROWTH
Culture: NO GROWTH
Special Requests: ADEQUATE

## 2023-03-06 LAB — CBC
HCT: 35.7 % — ABNORMAL LOW (ref 39.0–52.0)
Hemoglobin: 11.5 g/dL — ABNORMAL LOW (ref 13.0–17.0)
MCH: 30.1 pg (ref 26.0–34.0)
MCHC: 32.2 g/dL (ref 30.0–36.0)
MCV: 93.5 fL (ref 80.0–100.0)
Platelets: 326 10*3/uL (ref 150–400)
RBC: 3.82 MIL/uL — ABNORMAL LOW (ref 4.22–5.81)
RDW: 12.5 % (ref 11.5–15.5)
WBC: 9.2 10*3/uL (ref 4.0–10.5)
nRBC: 0 % (ref 0.0–0.2)

## 2023-03-06 LAB — GLUCOSE, CAPILLARY
Glucose-Capillary: 153 mg/dL — ABNORMAL HIGH (ref 70–99)
Glucose-Capillary: 216 mg/dL — ABNORMAL HIGH (ref 70–99)
Glucose-Capillary: 108 mg/dL — ABNORMAL HIGH (ref 70–99)
Glucose-Capillary: 162 mg/dL — ABNORMAL HIGH (ref 70–99)

## 2023-03-06 NOTE — Anesthesia Postprocedure Evaluation (Signed)
Anesthesia Post Note  Patient: Diron Gene Grimley  Procedure(s) Performed: TRANSURETHRAL RESECTION OF THE PROSTATE (TURP)/ UNROOFING OF PROSTATE ABSCESS (Prostate)  Patient location during evaluation: PACU Anesthesia Type: General Level of consciousness: awake and alert Pain management: pain level controlled Vital Signs Assessment: post-procedure vital signs reviewed and stable Respiratory status: spontaneous breathing, nonlabored ventilation and respiratory function stable Cardiovascular status: blood pressure returned to baseline and stable Postop Assessment: no apparent nausea or vomiting Anesthetic complications: no   No notable events documented.   Last Vitals:  Vitals:   03/05/23 2029 03/06/23 0439  BP: 117/66 119/71  Pulse: (!) 58 (!) 47  Resp: 18 18  Temp: 37.3 C 36.6 C  SpO2: 98% 97%    Last Pain:  Vitals:   03/06/23 0645  TempSrc:   PainSc: 2                  Foye Deer

## 2023-03-06 NOTE — Care Management Important Message (Signed)
Important Message  Patient Details  Name: Colin Griffin MRN: [id number] Date of Birth: July 19, 1951   Medicare Important Message Given:  Yes     Johnell Comings 03/06/2023, 11:04 AM

## 2023-03-06 NOTE — Progress Notes (Signed)
Progress Note   Patient: Colin Griffin RSW:[id number] DOB: 18-Aug-1951 DOA: 03/01/2023     5 DOS: the patient was seen and examined on 03/06/2023   Brief hospital course: HPI on admission 03/01/2023: "Colin Griffin is a 72 y.o. male with a known history of diabetes diagnosed 1 month ago presents to the ED for evaluation of pelvic abscess.  Patient was in a usual state of health until 1 month ago he reports that he was diagnosed with a urinary tract infection for which she completed a course of Bactrim.  He was seen in the ED about 10 days ago for urinary retention and Foley catheter was placed.  He followed up with urology today for a CT scan which showed a small abscess in the prostate and a larger pelvic abscess.  He was referred by urology to the ED for admission for IR drainage of the abscess as well as IV antibiotics.   Of note he reports 25 pound weight loss in the past 2 months since being diagnosed with diabetes.  He has made significant changes to his diet. ...   Patient was started on IV Vancomycin and Zosyn, admitted to medicine service with general surgery and urology consulted.  IR consulted for CT-guided drainage and obtain cultures.  Further hospital course and management as outlined below.  Assessment and Plan: * Pelvic abscess in male CT abdomen/pelvis showed a large 6.8 x 5.3 cm abscess in the left ischial rectal fossa in addition to multiple prostate gland abscesses. Abscess cultures grew MSSA. --General surgery and IR consulted --s/p CT-guided drainage with IR on 4/4 --Initially treated w IV Vanc/Zosyn --De-escalated to IV Ancef 4/7 --Follow blood cultures --Monitor drain output --Pain control --IR recommends d/c home with drain, flush daily with 5 cc sterile saline, monitor output daily and follow up outpatient. They will arrange appt.  Constipation Bowel regimen per orders  Elevated LFTs Improving.  Mildly elevated Alk phos, AST, ALT. Normal bili. Asyptomatic, no  abdominal pain or N/V. Suspect due to infection --Monitor CMP --LFT's were normal on 02/20/23  Prostatitis Urinary retention Urology following. Has Foley since recent ED encounter  Status post (TURP)/ UNROOFING OF PROSTATE ABSCESS  on 03/03/23 with Dr. Lonna Cobb. --Urology following - see their recs --Foley d/c this AM (4/8) --Recommend 3-4 weeks of PO antibiotics at d/c (likely Bactrim after d/w pharmacists)  Type 2 diabetes mellitus with hyperglycemia, without long-term current use of insulin Recently diagnosed.  Hbg A1c is 11.1%. He has changed diet dramatically and is losing weight.  Sugars he reports fairly well controlled. --Sliding scale Novolog coverage --Diabetes education appreciated --Continue diet and lifestyle efforts --Inpatient CBG goal is 140-180        Subjective: Pt seen awake resting in bed today. Reports having a rough night due to left hip pain and pain at the site of the abscess drain.  Foley had just been removed, monitoring for retention or bleeding s/p TURP.  Difficulty sleeping overnight due to pain and quite tired today.  Finally had a good BM yesterday.   Physical Exam: Vitals:   03/05/23 1603 03/05/23 2029 03/06/23 0439 03/06/23 0819  BP: 110/68 117/66 119/71 116/62  Pulse: (!) 59 (!) 58 (!) 47 (!) 49  Resp: 18 18 18 18   Temp: 97.7 F (36.5 C) 99.2 F (37.3 C) 97.8 F (36.6 C) (!) 97.5 F (36.4 C)  TempSrc:      SpO2: 100% 98% 97% 97%  Weight:      Height:  General exam: awake, appears drowsy and uncomfortable, no acute distress HEENT: moist mucus membranes, hearing grossly normal  Respiratory system: CTAB, no wheezes, rales or rhonchi, normal respiratory effort. Cardiovascular system: normal S1/S2,  RRR, no pedal edema.   Gastrointestinal system: soft, NT, ND Central nervous system: A&O x. no gross focal neurologic deficits, normal speech Extremities: moves all , no edema, normal tone Skin: dry, intact, normal temperature Psychiatry:  normal mood, congruent affect, judgement and insight appear normal  JP drain with serosanguinous appearing fluid  Data Reviewed:  Notable labs ---  glucose 132, alk phos 148 improved, AST normalized, ALT improved 76>>^5, albumin 2.7, leukocytosis resolved, Hbg 11.5 stable  Micro ---  Blood cultures -- pending - negative to date Pelvic abscess cultures --- MSSA (resistant only to Cipro)  Family Communication:  None present on rounds. Pt is able to update.  Disposition: Status is: Inpatient Remains inpatient appropriate because: continuing with IV antibiotics and closely monitoring as outlined above   Planned Discharge Destination: Home    Time spent: 36 minutes  Author: Pennie Banter, DO 03/06/2023 2:23 PM  For on call review [email or web address].

## 2023-03-07 ENCOUNTER — Encounter: Payer: Self-pay | Admitting: Family Medicine

## 2023-03-07 DIAGNOSIS — K651 Peritoneal abscess: Secondary | ICD-10-CM | POA: Diagnosis not present

## 2023-03-07 LAB — AEROBIC/ANAEROBIC CULTURE W GRAM STAIN (SURGICAL/DEEP WOUND)

## 2023-03-07 LAB — GLUCOSE, CAPILLARY
Glucose-Capillary: 176 mg/dL — ABNORMAL HIGH (ref 70–99)
Glucose-Capillary: 159 mg/dL — ABNORMAL HIGH (ref 70–99)

## 2023-03-07 LAB — SURGICAL PATHOLOGY

## 2023-03-07 MED ORDER — SULFAMETHOXAZOLE-TRIMETHOPRIM 800-160 MG PO TABS: 1.0000 | ORAL_TABLET | Freq: Two times a day (BID) | ORAL | 0 refills | Status: AC

## 2023-03-07 MED ORDER — SENNOSIDES-DOCUSATE SODIUM 8.6-50 MG PO TABS: 1.0000 | ORAL_TABLET | Freq: Two times a day (BID) | ORAL | Status: DC

## 2023-03-07 MED ORDER — POLYETHYLENE GLYCOL 3350 17 G PO PACK: 17.0000 g | PACK | Freq: Every day | ORAL | 1 refills | Status: DC

## 2023-03-07 MED ORDER — ACETAMINOPHEN 325 MG PO TABS: 650.0000 mg | ORAL_TABLET | Freq: Four times a day (QID) | ORAL | Status: DC | PRN

## 2023-03-07 MED ORDER — HYDROCODONE-ACETAMINOPHEN 5-325 MG PO TABS: 1.0000 | ORAL_TABLET | ORAL | 0 refills | Status: DC | PRN

## 2023-03-07 MED ORDER — BISACODYL 5 MG PO TBEC: 5.0000 mg | DELAYED_RELEASE_TABLET | Freq: Every day | ORAL | 0 refills | Status: DC | PRN

## 2023-03-07 NOTE — Progress Notes (Signed)
Colin Griffin to be D/C'd per MD order. Discussed with the patient and all questions fully answered. ? VSS, Skin clean, dry and intact without evidence of skin break down, no evidence of skin tears noted. ? IV catheter discontinued intact. Site without signs and symptoms of complications. Dressing and pressure applied. ? An After Visit Summary was printed and given to the patient. Patient informed where to pickup prescriptions. ? D/c education completed with patient/family including follow up instructions, medication list, d/c activities limitations if indicated, with other d/c instructions as indicated by MD - patient able to verbalize understanding, all questions fully answered.  ? Patient instructed to return to ED, call 911, or call MD for any changes in condition.   Education provided regarding JP drain, patient states his wife is an LPN and will help manage line.  All belongings returned including cell phone, ring and clothing. ? Patient to be escorted via WC, and D/C home via private auto.

## 2023-03-07 NOTE — Discharge Summary (Signed)
Physician Discharge Summary   Patient: Colin Griffin MRN: [id number] DOB: 1951-01-21  Admit date:     03/01/2023  Discharge date: 03/07/23  Discharge Physician: Colin Griffin   PCP: Colin Ramp, MD   Recommendations at discharge:   Follow up with Interventional Radiology for drain removal when clinically appropriate Follow up with Urology Follow up with Primary Care Check weekly BMP while on Bactrim Follow up on unintentional weight loss and muscle atrophy - recommend colonoscopy and CT chest at minimum for malignancy screening  Discharge Diagnoses: Principal Problem:   Pelvic abscess in male Active Problems:   Type 2 diabetes mellitus with hyperglycemia, without long-term current use of insulin   Prostatitis   Elevated LFTs  Resolved Problems:   Constipation  Hospital Course: HPI on admission 03/01/2023: "Colin Griffin is a 72 y.o. male with a known history of diabetes diagnosed 1 month ago presents to the ED for evaluation of pelvic abscess.  Patient was in a usual state of health until 1 month ago he reports that he was diagnosed with a urinary tract infection for which she completed a course of Bactrim.  He was seen in the ED about 10 days ago for urinary retention and Foley catheter was placed.  He followed up with urology today for a CT scan which showed a small abscess in the prostate and a larger pelvic abscess.  He was referred by urology to the ED for admission for IR drainage of the abscess as well as IV antibiotics.   Of note he reports 25 pound weight loss in the past 2 months since being diagnosed with diabetes.  He has made significant changes to his diet. ...   Patient was started on IV Vancomycin and Zosyn, admitted to medicine service with general surgery and urology consulted.  IR consulted for CT-guided drainage and obtain cultures.  Further hospital course and management as outlined below.  Assessment and Plan: * Pelvic abscess in male CT  abdomen/pelvis showed a large 6.8 x 5.3 cm abscess in the left ischial rectal fossa in addition to multiple prostate gland abscesses. Abscess cultures grew MSSA. --General surgery and IR consulted --s/p CT-guided drainage with IR on 4/4 --Initially treated w IV Vanc/Zosyn --De-escalated to IV Ancef 4/7 --Discharge on Bactrim DS x 4 weeks, also for Prostatisis, duration per urology --Follow blood cultures --Monitor drain output --Pain control --IR recommends d/Griffin home with drain, flush daily with 5 cc sterile saline, monitor output daily and follow up outpatient. They will arrange appt.  Elevated LFTs Improving.  Mildly elevated Alk phos, AST, ALT. Normal bili. Asyptomatic, no abdominal pain or N/V. Suspect due to infection --Monitor CMP --LFT's were normal on 02/20/23  Prostatitis Urinary retention - resolved Urology following. Has Foley since recent ED encounter  Status post (TURP)/ UNROOFING OF PROSTATE ABSCESS  on 03/03/23 with Dr. Lonna Griffin. --Urology following - see their recs --Foley d/Griffin 4/8 and pt voiding well --Discharge on PO Bactrim DS x 4 weeks --WEEKLY BMP while on Bactrim --Follow up with Urology as scheduled  Type 2 diabetes mellitus with hyperglycemia, without long-term current use of insulin Recently diagnosed.  Hbg A1c is 11.1%. He has changed diet dramatically and is losing weight.  Sugars he reports fairly well controlled. --Sliding scale Novolog coverage during admission --Resume metformin and recently started Trulicity on d/Griffin --Close PCP follow up --Diabetes education appreciated --Continue diet and lifestyle efforts --Inpatient CBG goal is 140-180  Constipation-resolved as of 03/07/2023 Bowel regimen per orders  Consultants: Urology, General Surgery, Interventional Radiology Procedures performed: CT guided pelvic abscess drain placement, TURP   Disposition: Home  Diet recommendation:  Discharge Diet Orders (From admission, onward)      Start     Ordered   03/07/23 0000  Diet - low sodium heart healthy        03/07/23 1139           Cardiac and Carb modified diet DISCHARGE MEDICATION: Allergies as of 03/07/2023   No Known Allergies      Medication List     STOP taking these medications    traMADol 50 MG tablet Commonly known as: ULTRAM       TAKE these medications    acetaminophen 325 MG tablet Commonly known as: TYLENOL Take 2 tablets (650 mg total) by mouth every 6 (six) hours as needed for mild pain, fever or headache.   bisacodyl 5 MG EC tablet Commonly known as: DULCOLAX Take 1 tablet (5 mg total) by mouth daily as needed for moderate constipation.   HYDROcodone-acetaminophen 5-325 MG tablet Commonly known as: NORCO/VICODIN Take 1-2 tablets by mouth every 4 (four) hours as needed for moderate pain or severe pain.   metFORMIN 500 MG 24 hr tablet Commonly known as: GLUCOPHAGE-XR Take 1 tablet (500 mg total) by mouth daily with breakfast.   polyethylene glycol 17 g packet Commonly known as: MIRALAX / GLYCOLAX Take 17 g by mouth daily. Start taking on: March 08, 2023   senna-docusate 8.6-50 MG tablet Commonly known as: Senokot-S Take 1 tablet by mouth 2 (two) times daily.   solifenacin 5 MG tablet Commonly known as: VESICARE Take 1 tablet (5 mg total) by mouth daily.   sulfamethoxazole-trimethoprim 800-160 MG tablet Commonly known as: BACTRIM DS Take 1 tablet by mouth 2 (two) times daily for 28 days.   tamsulosin 0.4 MG Caps capsule Commonly known as: FLOMAX Take 1 capsule (0.4 mg total) by mouth 2 (two) times daily after a meal.   Trulicity 0.75 MG/0.5ML Sopn Generic drug: Dulaglutide Inject 0.75 mg into the skin once a week.        Follow-up Information     Colin Griffin, Colin C, MD Follow up on 03/24/2023.   Specialty: Urology Why: Go at 9:15am. Contact information: [address] [place] [address] [phone]         Colin RampSimmons-Robinson, Makiera,  MD. Go on 03/16/2023.   Specialty: Family Medicine Why: Hospital follow up and weekly labs while on Bactrim. Go at 2:45pm your appointment is at 3:00pm. Contact information: [address] [place] [address] [phone]         Pernell DupreEl-Abd, Yasser J, MD. Go on 03/07/2023.   Specialties: Interventional Radiology, Diagnostic Radiology, Radiology Why: [facility] radiology follow up for drain. The office wiil call you. Contact information: [address] [place] [address] [phone]                Discharge Exam: Ceasar MonsFiled Weights   03/01/23 1856 03/02/23 1407  Weight: 79.4 kg 79.4 kg   General exam: awake, alert, no acute distress HEENT: moist mucus membranes, hearing grossly normal  Respiratory system: on room air, normal respiratory effort. Cardiovascular system: normal S1/S2,  RRR, no JVD, murmurs, rubs, gallops,  no pedal edema.   Central nervous system: A&O x4. no gross focal neurologic deficits, normal speech Extremities: moves all, no edema, normal tone Skin: dry, intact, normal temperature Psychiatry: normal mood, congruent affect, judgement and insight appear  normal  JP drain from left pelvis with minimal serosanguinous, non-purulent fluid  Condition at discharge: stable  The results of significant diagnostics from this hospitalization (including imaging, microbiology, ancillary and laboratory) are listed below for reference.   Imaging Studies: CT GUIDED VISCERAL FLUID DRAIN BY PERC CATH  Result Date: 03/02/2023 INDICATION: Pelvic abscess EXAM: CT-guided drain placement into pelvic abscess MEDICATIONS: Documented in the EMR ANESTHESIA/SEDATION: Moderate (conscious) sedation was employed during this procedure. A total of Versed 2 mg and Fentanyl 100 mcg was administered intravenously by the radiology nurse. Total intra-service moderate Sedation Time: 13 minutes. The patient's level of consciousness and vital signs were monitored  continuously by radiology nursing throughout the procedure under my direct supervision. COMPLICATIONS: None immediate. PROCEDURE: Informed written consent was obtained from the patient after a thorough discussion of the procedural risks, benefits and alternatives. All questions were addressed. Maximal Sterile Barrier Technique was utilized including caps, mask, sterile gowns, sterile gloves, sterile drape, hand hygiene and skin antiseptic. A timeout was performed prior to the initiation of the procedure. The patient was placed prone on the exam table. Limited CT of the abdomen and pelvis was performed for planning purposes. This demonstrated pelvic fluid collection in the posterior pelvis. Skin entry site was marked, and the overlying skin was prepped and draped in the standard sterile fashion. Local analgesia was obtained with 1% lidocaine. Using intermittent CT fluoroscopy, a 19 gauge Yueh catheter was advanced into the pelvic fluid collection via a posterior left transgluteal approach. Location was confirmed with CT and return of tan colored purulent material. A sample was collected and sent for microbiology analysis. Over an 035 Amplatz wire, the percutaneous tract was serially dilated to accommodate a 12 French locking drainage catheter. Location was again confirmed with CT and return of additional purulent material. Locking loop was formed, and the drainage catheter was secured to the skin using silk suture and a dressing. It was attached to bulb suction. The patient tolerated the procedure well without immediate complication. IMPRESSION: Successful CT-guided placement of a 12 French locking drainage catheter into the pelvic abscess via left transgluteal approach. Drainage catheter attached to bulb suction. A sample was sent for microbiology analysis. Electronically Signed   By: Olive Bass M.D.   On: 03/02/2023 16:19   DG Chest Port 1 View  Result Date: 03/01/2023 CLINICAL DATA:  Pelvic abscess EXAM:  PORTABLE CHEST 1 VIEW COMPARISON:  CT done earlier today FINDINGS: The heart size and mediastinal contours are within normal limits. Both lungs are clear. The visualized skeletal structures are unremarkable. IMPRESSION: No active disease. Electronically Signed   By: Ernie Avena M.D.   On: 03/01/2023 19:45   CT Abdomen Pelvis W Contrast  Result Date: 03/01/2023 CLINICAL DATA:  Left-sided pelvic mass. EXAM: CT ABDOMEN AND PELVIS WITH CONTRAST TECHNIQUE: Multidetector CT imaging of the abdomen and pelvis was performed using the standard protocol following bolus administration of intravenous contrast. RADIATION DOSE REDUCTION: This exam was performed according to the departmental dose-optimization program which includes automated exposure control, adjustment of the mA and/or kV according to patient size and/or use of iterative reconstruction technique. CONTRAST:  OMNIPAQUE IOHEXOL 300 MG/ML  SOLN COMPARISON:  None Available. FINDINGS: Lower chest: The lung bases are clear of an acute process. No pulmonary lesions or pleural effusions. Age advanced atherosclerotic calcifications involving the aorta and coronary arteries. No pericardial effusion. Hepatobiliary: No hepatic lesions or intrahepatic biliary dilatation. The gallbladder contains large rim calcified gallstones but no findings for acute  cholecystitis. Normal caliber common bile duct. Pancreas: No mass, inflammation or ductal dilatation. Spleen: Normal size.  No focal lesions. Adrenals/Urinary Tract: The adrenal glands and kidneys are unremarkable. No renal lesions, renal calculi or hydronephrosis. The delayed images do not demonstrate any significant collecting system abnormalities. There is a Foley catheter noted in the bladder. Stomach/Bowel: The stomach, duodenum, small bowel and colon are. No acute inflammatory process, mass lesions or obstructive findings. There is a large amount of stool throughout colon and down into the rectosigmoid area  suggesting constipation. Vascular/Lymphatic: Age advanced atherosclerotic calcifications involving the aorta and iliac arteries and branch vessels but no aneurysm or dissection. The major venous structures are patent. No mesenteric or retroperitoneal mass or adenopathy. Small scattered lymph nodes are noted. 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 There is also an adjacent large, 6.8 x 5.3 cm abscess in the left ischial rectal fossa. Ice in this broke out from the prostate gland. There is significant mass effect the rectum which is displaced to the right. I do not see any direct involvement of the rectum. Other: No pelvic mass or adenopathy. No free pelvic fluid collections. No inguinal mass or adenopathy. No abdominal wall hernia or subcutaneous lesions. Musculoskeletal: No significant bony findings. IMPRESSION: 1. 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 There is also an adjacent large, 6.8 x 5.3 cm abscess in the left ischial rectal fossa. 2. Cholelithiasis. 3. Age advanced atherosclerotic calcifications involving the aorta and iliac arteries and branch vessels. 4. Large amount of stool throughout the colon and down into the rectosigmoid area suggesting constipation. 5. Aortic atherosclerosis. Aortic Atherosclerosis (ICD10-I70.0). Electronically Signed   By: Rudie Meyer M.D.   On: 03/01/2023 15:37    Microbiology: Results for orders placed or performed during the hospital encounter of 03/01/23  Culture, blood (routine x 2)     Status: None   Collection Time: 03/01/23  9:38 PM   Specimen: BLOOD  Result Value Ref Range Status   Specimen Description BLOOD LEFT ARM  Final   Special Requests   Final    BOTTLES DRAWN AEROBIC AND ANAEROBIC Blood Culture results may not be optimal due to an inadequate volume of blood received in culture bottles   Culture    Final    NO GROWTH 5 DAYS Performed at [facility], [address], [place], [place] [address]    Report Status 03/06/2023 FINAL  Final  Culture, blood (routine x 2)     Status: None   Collection Time: 03/01/23  9:38 PM   Specimen: BLOOD  Result Value Ref Range Status   Specimen Description BLOOD LEFT ARM  Final   Special Requests   Final    BOTTLES DRAWN AEROBIC AND ANAEROBIC Blood Culture adequate volume   Culture   Final    NO GROWTH 5 DAYS Performed at [facility], [address], [place], [place] [address]    Report Status 03/06/2023 FINAL  Final  Aerobic/Anaerobic Culture w Gram Stain (surgical/deep wound)     Status: None   Collection Time: 03/02/23  3:24 PM   Specimen: Abscess  Result Value Ref Range Status   Specimen Description   Final    ABSCESS Performed at [facility], [address], [place], [place] [address]    Special Requests   Final    NONE Performed at [facility], [address], [place], [place]  [address]    Gram Stain   Final    ABUNDANT WBC PRESENT, PREDOMINANTLY PMN FEW GRAM POSITIVE COCCI IN PAIRS    Culture   Final    ABUNDANT STAPHYLOCOCCUS AUREUS NO ANAEROBES ISOLATED Performed at [facility], [address], [place], [place] [address]    Report Status 03/07/2023 FINAL  Final   Organism ID, Bacteria STAPHYLOCOCCUS AUREUS  Final      Susceptibility   Staphylococcus aureus - MIC*    CIPROFLOXACIN >=8 RESISTANT Resistant     ERYTHROMYCIN <=0.25 SENSITIVE Sensitive     GENTAMICIN <=0.5 SENSITIVE Sensitive     OXACILLIN 0.5 SENSITIVE Sensitive     TETRACYCLINE <=1 SENSITIVE Sensitive     VANCOMYCIN 1 SENSITIVE Sensitive     TRIMETH/SULFA <=10 SENSITIVE Sensitive     CLINDAMYCIN <=0.25 SENSITIVE Sensitive     RIFAMPIN <=0.5 SENSITIVE Sensitive     Inducible Clindamycin NEGATIVE Sensitive     * ABUNDANT STAPHYLOCOCCUS AUREUS    Labs: CBC: Recent Labs  Lab 03/02/23 0504 03/03/23 0541  03/04/23 0455 03/05/23 0443 03/06/23 0342  WBC 16.4* 12.9* 13.2* 11.4* 9.2  HGB 11.5* 10.8* 10.5* 10.4* 11.5*  HCT 34.9* 33.1* 31.7* 31.9* 35.7*  MCV 91.4 92.2 91.4 91.9 93.5  PLT 341 281 310 305 326   Basic Metabolic Panel: Recent Labs  Lab 03/02/23 0504 03/03/23 0541 03/04/23 0455 03/05/23 0443 03/06/23 0342  NA 133* 138 137 138 140  K 3.8 3.9 3.9 3.9 4.4  CL 105 109 106 106 105  CO2 21* 23 24 25 29   GLUCOSE 146* 130* 184* 161* 132*  BUN 18 13 11 12 12   CREATININE 0.90 0.91 0.83 0.89 0.80  CALCIUM 8.6* 8.7* 8.5* 8.8* 9.0  MG  --  2.0  --   --   --    Liver Function Tests: Recent Labs  Lab 03/02/23 0504 03/03/23 0541 03/04/23 0455 03/05/23 0443 03/06/23 0342  AST 36 57* 46* 53* 40  ALT 48* 68* 65* 76* 65*  ALKPHOS 170* 188* 167* 155* 148*  BILITOT 0.6 0.8 0.6 0.7 0.5  PROT 7.2 6.6 6.5 6.4* 6.9  ALBUMIN 2.6* 2.4* 2.4* 2.6* 2.7*   CBG: Recent Labs  Lab 03/06/23 1134 03/06/23 1644 03/06/23 2117 03/07/23 0731 03/07/23 1115  GLUCAP 216* 108* 162* 176* 159*    Discharge time spent: greater than 30 minutes.  Signed: Pennie Banter, DO [facility] 03/07/2023

## 2023-03-08 ENCOUNTER — Other Ambulatory Visit: Payer: Self-pay | Admitting: Urology

## 2023-03-08 ENCOUNTER — Ambulatory Visit: Payer: Medicare PPO | Admitting: Urology

## 2023-03-08 ENCOUNTER — Telehealth: Payer: Self-pay

## 2023-03-08 DIAGNOSIS — K651 Peritoneal abscess: Secondary | ICD-10-CM

## 2023-03-08 DIAGNOSIS — E1165 Type 2 diabetes mellitus with hyperglycemia: Secondary | ICD-10-CM

## 2023-03-08 NOTE — Telephone Encounter (Unsigned)
Copied from CRM [id number]. Topic: General - Other >> Mar 08, 2023 12:00 PM Carrielelia G wrote: Reason for CRM: patient is requesting a [employer] or [employer] sense he is a hard stick they had to use his ear while in the hospital

## 2023-03-08 NOTE — Transitions of Care (Post Inpatient/ED Visit) (Signed)
   03/08/2023  Name: Colin Griffin MRN: [id number] DOB: 04-24-1951  Today's TOC FU Call Status: Today's TOC FU Call Status:: Successful TOC FU Call Competed TOC FU Call Complete Date: 03/08/23  Transition Care Management Follow-up Telephone Call Date of Discharge: 03/07/23 Discharge Facility: [facility]) Type of Discharge: Inpatient Admission Primary Inpatient Discharge Diagnosis:: pertoneal abscess How have you been since you were released from the hospital?: Better Any questions or concerns?: No  Items Reviewed: Did you receive and understand the discharge instructions provided?: Yes Medications obtained and verified?: Yes (Medications Reviewed) Any new allergies since your discharge?: No Dietary orders reviewed?: Yes Do you have support at home?: No  Home Care and Equipment/Supplies: Were Home Health Services Ordered?: NA Any new equipment or medical supplies ordered?: NA  Functional Questionnaire: Do you need assistance with bathing/showering or dressing?: No Do you need assistance with meal preparation?: No Do you need assistance with eating?: No Do you have difficulty maintaining continence: No Do you need assistance with getting out of bed/getting out of a chair/moving?: No Do you have difficulty managing or taking your medications?: No  Follow up appointments reviewed: PCP Follow-up appointment confirmed?: Yes Date of PCP follow-up appointment?: 03/16/23 Follow-up Provider: [facility] Follow-up appointment confirmed?: Yes Date of Specialist follow-up appointment?: 03/24/23 Follow-Up Specialty Provider:: Dr Lonna Cobb Do you need transportation to your follow-up appointment?: No Do you understand care options if your condition(s) worsen?: Yes-patient verbalized understanding    SIGNATURE Karena Addison, LPN [facility] Nurse Health Advisor Direct Dial [phone]

## 2023-03-09 MED ORDER — FREESTYLE LIBRE 3 SENSOR MISC
2.0000 | Freq: Every day | 6 refills | Status: DC
Start: 2023-03-09 — End: 2023-12-20

## 2023-03-09 MED ORDER — FREESTYLE LIBRE 3 READER DEVI
2.0000 | Freq: Every day | 6 refills | Status: DC
Start: 2023-03-09 — End: 2023-03-13

## 2023-03-09 NOTE — Telephone Encounter (Signed)
Freestyle libre sensors and readers prescribed for management of diabetes.  Patient having difficulty with glucose checks while hospitalized and need glucose monitoring due to A1c of 11.1   Please advise patient that prescription has been sent   Ronnald Ramp, MD  [facility]

## 2023-03-10 ENCOUNTER — Telehealth: Payer: Self-pay | Admitting: *Deleted

## 2023-03-10 ENCOUNTER — Telehealth: Payer: Self-pay | Admitting: Family Medicine

## 2023-03-10 DIAGNOSIS — C61 Malignant neoplasm of prostate: Secondary | ICD-10-CM

## 2023-03-10 NOTE — Telephone Encounter (Signed)
Advised 

## 2023-03-10 NOTE — Telephone Encounter (Signed)
Pt calling asking about surgical pathology results, pt seen results on mychart and asking for a call back.

## 2023-03-10 NOTE — Telephone Encounter (Signed)
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  Only 2.6 g of tissue was resected.  Chart review shows no prior PSA results.  He is scheduled for CT next week with possible removal of his pelvic drain.  Initially recommended obtaining a prostate MRI ~ 2 weeks after drain is removed.  Depending on those results discussed we may need to schedule transrectal biopsies for greater tissue sampling.  Prostate MRI order placed

## 2023-03-13 ENCOUNTER — Telehealth: Payer: Self-pay

## 2023-03-13 ENCOUNTER — Other Ambulatory Visit: Payer: Self-pay | Admitting: Family Medicine

## 2023-03-13 DIAGNOSIS — E1165 Type 2 diabetes mellitus with hyperglycemia: Secondary | ICD-10-CM

## 2023-03-13 MED ORDER — FREESTYLE LIBRE 3 READER DEVI: 2.0000 | Freq: Every day | 6 refills | Status: DC

## 2023-03-13 NOTE — Telephone Encounter (Signed)
Copied from CRM [id number]. Topic: General - Other >> Mar 13, 2023 11:41 AM Macon Large wrote: Reason for CRM: Pt reports that the pharmacy informed him that the [facility] needs prior authorization. Cb# [phone]

## 2023-03-13 NOTE — Telephone Encounter (Signed)
PA started

## 2023-03-14 NOTE — Telephone Encounter (Signed)
Received notice of approval for the FREESTYLE LIBRE 3 READER-has been approved for the following dates 11/28/2022-11/28/2023  Authorization Number: [id number]  LM on voicemail letting patient know.

## 2023-03-15 NOTE — Progress Notes (Unsigned)
I,J'ya E Hunter,acting as a scribe for [employer], MD.,have documented all relevant documentation on the behalf of Ronnald Ramp, MD,as directed by  Ronnald Ramp, MD while in the presence of Ronnald Ramp, MD.   Established patient visit   Patient: Colin Griffin   DOB: October 27, 1951   72 y.o. Male  MRN: [id number] Visit Date: 03/16/2023  Today's healthcare provider: Ronnald Ramp, MD   Chief Complaint  Patient presents with   Hospitalization Follow-up   Subjective    HPI  Follow up Hospitalization  Patient was admitted to [facility] on 03/01/2023 and discharged on 03/07/2023. He was treated for Pelvic abscess and Prostatitis. Treatment for this included IV Vancomycin and Zosyn de-escalated to IV Ancef 04/07. CT-guided drainage with IR on 04/04.Discharge on Bactrim DS x 4 wks. Telephone follow up was done on 03/08/2023. He reports excellent compliance with treatment. He reports this condition is improved. Patient recommended for weekly BMP while on bactrim  He reports that he has been able to have bowel movements but notices that the stool appears thin and string like, noticed that this changes   ----------------------------------------------------------------------------------------- -  Diabetes: patient reports he has adjusted his diet and has been able to regain some weight since his admission to the hospital  He has been eating sugar free foods and would like to work with a diabetes educator and nutritionist   Health Maintenance  Patient is requesting colonoscopy and lung cancer screening referrals    Medications: Outpatient Medications Prior to Visit  Medication Sig   acetaminophen (TYLENOL) 325 MG tablet Take 2 tablets (650 mg total) by mouth every 6 (six) hours as needed for mild pain, fever or headache.   Continuous Blood Gluc Sensor (FREESTYLE LIBRE 3 SENSOR) MISC 2 Devices by Does not apply route daily. Place  1 sensor on the skin every 14 days. Use to check glucose continuously   Continuous Glucose Receiver (FREESTYLE LIBRE 3 READER) DEVI 2 Devices by Does not apply route daily. Use to monitor sensor glucose reading daily   Dulaglutide (TRULICITY) 0.75 MG/0.5ML SOPN Inject 0.75 mg into the skin once a week.   HYDROcodone-acetaminophen (NORCO/VICODIN) 5-325 MG tablet Take 1-2 tablets by mouth every 4 (four) hours as needed for moderate pain or severe pain.   metFORMIN (GLUCOPHAGE-XR) 500 MG 24 hr tablet Take 1 tablet (500 mg total) by mouth daily with breakfast.   polyethylene glycol (MIRALAX / GLYCOLAX) 17 g packet Take 17 g by mouth daily.   solifenacin (VESICARE) 5 MG tablet Take 1 tablet (5 mg total) by mouth daily.   sulfamethoxazole-trimethoprim (BACTRIM DS) 800-160 MG tablet Take 1 tablet by mouth 2 (two) times daily for 28 days.   tamsulosin (FLOMAX) 0.4 MG CAPS capsule Take 1 capsule (0.4 mg total) by mouth 2 (two) times daily after a meal.   [DISCONTINUED] bisacodyl (DULCOLAX) 5 MG EC tablet Take 1 tablet (5 mg total) by mouth daily as needed for moderate constipation.   [DISCONTINUED] senna-docusate (SENOKOT-S) 8.6-50 MG tablet Take 1 tablet by mouth 2 (two) times daily.   No facility-administered medications prior to visit.    Review of Systems     Objective    BP 126/68 (BP Location: Right Arm, Patient Position: Sitting, Cuff Size: Large)   Pulse 60   Temp 98.1 F (36.7 C) (Oral)   Ht 6\' 1"  (1.854 m)   Wt 174 lb (78.9 kg)   SpO2 100%   BMI 22.96 kg/m    Physical Exam Vitals  reviewed.  Constitutional:      General: He is not in acute distress.    Appearance: Normal appearance. He is not ill-appearing, toxic-appearing or diaphoretic.  Eyes:     Conjunctiva/sclera: Conjunctivae normal.  Cardiovascular:     Rate and Rhythm: Normal rate and regular rhythm.     Pulses: Normal pulses.     Heart sounds: Normal heart sounds. No murmur heard.    No friction rub. No gallop.   Pulmonary:     Effort: Pulmonary effort is normal. No respiratory distress.     Breath sounds: Normal breath sounds. No stridor. No wheezing, rhonchi or rales.  Abdominal:     General: Bowel sounds are normal. There is no distension.     Palpations: Abdomen is soft.     Tenderness: There is no abdominal tenderness.  Musculoskeletal:     Right lower leg: No edema.     Left lower leg: No edema.  Skin:    Findings: No erythema or rash.  Neurological:     Mental Status: He is alert and oriented to person, place, and time.      No results found for any visits on 03/16/23.  Assessment & Plan     Problem List Items Addressed This Visit       Endocrine   Type 2 diabetes mellitus with hyperglycemia, without long-term current use of insulin    Recently diagnosed A1c initially 11  Patient has made dietary changes to manage blood glucose  Will plan to recheck A1c in June 2024  Continue metformin  daily and trulicity 0.75mg  weekly        Relevant Orders   Referral to Nutrition and Diabetes Services     Other   History of smoking greater than 50 pack years    Lung cancer screening referral submitted today       Relevant Orders   Ambulatory Referral Lung Cancer Screening [facility]   Pelvic abscess in male - Primary    Resolved  Patient reports feeling improved  Has been tolerating bactrim well  Will check BMP today for two weeks of bactrim to check renal function  Orders for 1 week retest of BMP         Relevant Orders   Basic Metabolic Panel (BMET)   Healthcare maintenance    Lung cancer screening referral submitted  Referral for GI for colonoscopy submitted today       Other Visit Diagnoses     High risk medication use       Relevant Orders   Basic Metabolic Panel (BMET)   Basic Metabolic Panel (BMET)   Screening for colon cancer       Relevant Orders   Ambulatory referral to Gastroenterology        Return in about 1 week (around  03/23/2023) for LAB only, no appt needed .        The entirety of the information documented in the History of Present Illness, Review of Systems and Physical Exam were personally obtained by me. Portions of this information were initially documented by Hetty Ely, CMA. I, Ronnald Ramp, MD have reviewed the documentation above for thoroughness and accuracy.      Ronnald Ramp, MD  [facility] [phone] (phone) [phone] (fax)  [facility]

## 2023-03-16 ENCOUNTER — Ambulatory Visit
Admission: RE | Admit: 2023-03-16 | Discharge: 2023-03-16 | Disposition: A | Discharge status: Home / Self Care | Payer: Medicare PPO | Source: Ambulatory Visit | Attending: Student | Admitting: Student

## 2023-03-16 ENCOUNTER — Ambulatory Visit
Admission: RE | Admit: 2023-03-16 | Discharge: 2023-03-16 | Disposition: A | Discharge status: Home / Self Care | Payer: Medicare PPO | Source: Ambulatory Visit | Attending: Urology | Admitting: Urology

## 2023-03-16 ENCOUNTER — Other Ambulatory Visit: Payer: Medicare PPO

## 2023-03-16 ENCOUNTER — Ambulatory Visit (INDEPENDENT_AMBULATORY_CARE_PROVIDER_SITE_OTHER): Payer: Medicare PPO | Admitting: Family Medicine

## 2023-03-16 VITALS — BP 126/68 | HR 60 | Temp 98.1°F | Ht 73.0 in | Wt 174.0 lb

## 2023-03-16 DIAGNOSIS — E1165 Type 2 diabetes mellitus with hyperglycemia: Secondary | ICD-10-CM | POA: Diagnosis not present

## 2023-03-16 DIAGNOSIS — K651 Peritoneal abscess: Secondary | ICD-10-CM

## 2023-03-16 DIAGNOSIS — Z1211 Encounter for screening for malignant neoplasm of colon: Secondary | ICD-10-CM

## 2023-03-16 DIAGNOSIS — Z79899 Other long term (current) drug therapy: Secondary | ICD-10-CM

## 2023-03-16 DIAGNOSIS — Z0000 Encounter for general adult medical examination without abnormal findings: Secondary | ICD-10-CM

## 2023-03-16 DIAGNOSIS — Z87891 Personal history of nicotine dependence: Secondary | ICD-10-CM

## 2023-03-16 HISTORY — PX: IR RADIOLOGIST EVAL & MGMT: IMG5224

## 2023-03-16 MED ORDER — IOPAMIDOL (ISOVUE-300) INJECTION 61%
100.0000 mL | Freq: Once | INTRAVENOUS | Status: AC | PRN
  Administered 2023-03-16: 100 mL via INTRAVENOUS

## 2023-03-16 NOTE — Assessment & Plan Note (Signed)
Lung cancer screening referral submitted  Referral for GI for colonoscopy submitted today

## 2023-03-16 NOTE — Patient Instructions (Addendum)
It was a pleasure to see you today!  Thank you for choosing [facility] for your primary care.   Colin Griffin was seen for hospital follow up.   Our plans for today were: Please continue your bactrim as prescribed  We will check labs today and please return for your lab only visit next Wednesday between 8A-11:30A or 1-4:30PM I have submitted referrals for colon cancer screening and lung cancer screening. Please be on lookout for a call to schedule these appointments   You should return to our clinic in 1 week for labs. Please schedule for mid June for diabetes follow up   Best Wishes,   Dr. Roxan Hockey

## 2023-03-16 NOTE — Assessment & Plan Note (Signed)
Resolved  Patient reports feeling improved  Has been tolerating bactrim well  Will check BMP today for two weeks of bactrim to check renal function  Orders for 1 week retest of BMP

## 2023-03-16 NOTE — Assessment & Plan Note (Signed)
Recently diagnosed A1c initially 11  Patient has made dietary changes to manage blood glucose  Will plan to recheck A1c in June 2024  Continue metformin  daily and trulicity 0.75mg  weekly

## 2023-03-16 NOTE — Progress Notes (Addendum)
Referring Physician(s): Segal,Matthew L  Chief Complaint: Pelvic abscess s/p percutaneous drain placement  History of present illness:  Colin Griffin is a 72 y.o. male who presented to [facility] ED 03/01/2023 for evaluation of prostate and perirectal abscesses.  Patient has been treated for urologic symptoms including dysuria and difficulty urinating.  He was treated with Bactrim but without improvement was switched to Macrobid.  He was previously seen in the ED 02/20/2023 for urinary retention and had Foley catheter placed.  After following up with urology for 02/15/2023 and continuing to complain of perirectal discomfort despite Foley catheter in place he underwent OP CT abdomen pelvis which showed a 6.8 x 5.3 left ischial rectal fossa abscess.    He underwent CT guided drain placement on 03/02/2023 and returns to clinic today for follow up.  He has had minimal output from the drain.  He states that his pain is resolved and denies any fever or chills.  CT abdomen and pelvis shows resolution of the abscess in the left ischial rectal fossa.  There are persistent fluid collections within the prostate.  Past Medical History:  Diagnosis Date   Constipation 03/04/2023   Pre-diabetes     Past Surgical History:  Procedure Laterality Date   APPENDECTOMY     TRANSURETHRAL RESECTION OF PROSTATE N/A 03/03/2023   Procedure: TRANSURETHRAL RESECTION OF THE PROSTATE (TURP)/ UNROOFING OF PROSTATE ABSCESS;  Surgeon: Riki Altes, MD;  Location: [facility] ORS;  Service: Urology;  Laterality: N/A;    Allergies: Patient has no known allergies.  Medications: Prior to Admission medications   Medication Sig Start Date End Date Taking? Authorizing Provider  acetaminophen (TYLENOL) 325 MG tablet Take 2 tablets (650 mg total) by mouth every 6 (six) hours as needed for mild pain, fever or headache. 03/07/23   Pennie Banter, DO  bisacodyl (DULCOLAX) 5 MG EC tablet Take 1 tablet (5 mg total) by mouth daily as  needed for moderate constipation. 03/07/23   Pennie Banter, DO  Continuous Blood Gluc Sensor (FREESTYLE LIBRE 3 SENSOR) MISC 2 Devices by Does not apply route daily. Place 1 sensor on the skin every 14 days. Use to check glucose continuously 03/09/23   Simmons-Robinson, Tawanna Cooler, MD  Continuous Glucose Receiver (FREESTYLE LIBRE 3 READER) DEVI 2 Devices by Does not apply route daily. Use to monitor sensor glucose reading daily 03/13/23   Simmons-Robinson, Makiera, MD  Dulaglutide (TRULICITY) 0.75 MG/0.5ML SOPN Inject 0.75 mg into the skin once a week. Patient not taking: Reported on 03/08/2023 02/20/23   Simmons-Robinson, Tawanna Cooler, MD  HYDROcodone-acetaminophen (NORCO/VICODIN) 5-325 MG tablet Take 1-2 tablets by mouth every 4 (four) hours as needed for moderate pain or severe pain. 03/07/23 03/06/24  Pennie Banter, DO  metFORMIN (GLUCOPHAGE-XR) 500 MG 24 hr tablet Take 1 tablet (500 mg total) by mouth daily with breakfast. 02/15/23   Simmons-Robinson, Makiera, MD  polyethylene glycol (MIRALAX / GLYCOLAX) 17 g packet Take 17 g by mouth daily. 03/08/23   Esaw Grandchild A, DO  senna-docusate (SENOKOT-S) 8.6-50 MG tablet Take 1 tablet by mouth 2 (two) times daily. 03/07/23   Pennie Banter, DO  solifenacin (VESICARE) 5 MG tablet Take 1 tablet (5 mg total) by mouth daily. 02/22/23   Jacky Kindle, FNP  sulfamethoxazole-trimethoprim (BACTRIM DS) 800-160 MG tablet Take 1 tablet by mouth 2 (two) times daily for 28 days. 03/07/23 04/04/23  Esaw Grandchild A, DO  tamsulosin (FLOMAX) 0.4 MG CAPS capsule Take 1 capsule (0.4 mg total) by mouth  2 (two) times daily after a meal. 02/22/23   Jacky Kindle, FNP     Family History  Problem Relation Age of Onset   Atrial fibrillation Mother    Cancer Maternal Grandmother    Diabetes Other     Social History   Socioeconomic History   Marital status: Married    Spouse name: Editor, commissioning   Number of children: 1   Years of education: Not on file   Highest education  level: Master's degree (e.g., MA, MS, MEng, MEd, MSW, MBA)  Occupational History   Not on file  Tobacco Use   Smoking status: Every Day    Packs/day: 1.00    Years: 60.00    Additional pack years: 0.00    Total pack years: 60.00    Types: Cigarettes   Smokeless tobacco: Never   Tobacco comments:    Reports that at one time he smoked 3 packs per day while in the [employer]  Vaping Use   Vaping Use: Every day   Start date: 08/08/2022  Substance and Sexual Activity   Alcohol use: Never   Drug use: Never   Sexual activity: Not on file  Other Topics Concern   Not on file  Social History Narrative   ** Merged History Encounter **       Retired from Agricultural consultant radiology for 30 years    Social Determinants of Health   Financial Resource Strain: Low Risk  (03/15/2023)   Overall Financial Resource Strain (CARDIA)    Difficulty of Paying Living Expenses: Not hard at all  Food Insecurity: No Food Insecurity (03/15/2023)   Hunger Vital Sign    Worried About Running Out of Food in the Last Year: Never true    Ran Out of Food in the Last Year: Never true  Transportation Needs: No Transportation Needs (03/15/2023)   PRAPARE - Administrator, Civil Service (Medical): No    Lack of Transportation (Non-Medical): No  Physical Activity: Sufficiently Active (03/15/2023)   Exercise Vital Sign    Days of Exercise per Week: 7 days    Minutes of Exercise per Session: 150+ min  Stress: No Stress Concern Present (03/15/2023)   [employer] of Occupational Health - Occupational Stress Questionnaire    Feeling of Stress : Not at all  Social Connections: Socially Integrated (03/15/2023)   Social Connection and Isolation Panel [NHANES]    Frequency of Communication with Friends and Family: More than three times a week    Frequency of Social Gatherings with Friends and Family: Once a week    Attends Religious Services: More than 4 times per year    Active Member of [employer] or Organizations: Yes     Attends Engineer, structural: More than 4 times per year    Marital Status: Married   Vital Signs: There were no vitals taken for this visit.  Physical Exam Skin:    General: Skin is warm and dry.     Comments: Left transgluteal drain entry site is clean.  No redness or discharge.   Imaging: No results found.  Labs:  CBC: Recent Labs    03/03/23 0541 03/04/23 0455 03/05/23 0443 03/06/23 0342  WBC 12.9* 13.2* 11.4* 9.2  HGB 10.8* 10.5* 10.4* 11.5*  HCT 33.1* 31.7* 31.9* 35.7*  PLT 281 310 305 326    COAGS: Recent Labs    03/02/23 0504  INR 1.2    BMP: Recent Labs    03/03/23 0541 03/04/23 0455  03/05/23 0443 03/06/23 0342  NA 138 137 138 140  K 3.9 3.9 3.9 4.4  CL 109 106 106 105  CO2 23 24 25 29   GLUCOSE 130* 184* 161* 132*  BUN 13 11 12 12   CALCIUM 8.7* 8.5* 8.8* 9.0  CREATININE 0.91 0.83 0.89 0.80  GFRNONAA >60 >60 >60 >60    LIVER FUNCTION TESTS: Recent Labs    03/03/23 0541 03/04/23 0455 03/05/23 0443 03/06/23 0342  BILITOT 0.8 0.6 0.7 0.5  AST 57* 46* 53* 40  ALT 68* 65* 76* 65*  ALKPHOS 188* 167* 155* 148*  PROT 6.6 6.5 6.4* 6.9  ALBUMIN 2.4* 2.4* 2.6* 2.7*    Assessment and Plan:  72 year old gentleman with left ischial rectal fossa abscess s/p CT guided drain placement on 03/02/2023 returns to IR clinic for drain evaluation.  Given that the CT scan today shows resolution of abscess in the left ischial rectal fossa and minimal drain output, the drain the removed.    Electronically Signed: Al Corpus Brandom Kerwin 03/16/2023, 12:16 PM   I spent a total of 10 Minutes in face to face in clinical consultation, greater than 50% of which was counseling/coordinating care for left pelvic abscess drain.

## 2023-03-16 NOTE — Assessment & Plan Note (Signed)
Lung cancer screening referral submitted today

## 2023-03-17 LAB — BASIC METABOLIC PANEL
BUN/Creatinine Ratio: 19 (ref 10–24)
BUN: 18 mg/dL (ref 8–27)
CO2: 18 mmol/L — ABNORMAL LOW (ref 20–29)
Calcium: 9.7 mg/dL (ref 8.6–10.2)
Chloride: 99 mmol/L (ref 96–106)
Creatinine, Ser: 0.94 mg/dL (ref 0.76–1.27)
Glucose: 158 mg/dL — ABNORMAL HIGH (ref 70–99)
Potassium: 4.2 mmol/L (ref 3.5–5.2)
Sodium: 134 mmol/L (ref 134–144)
eGFR: 88 mL/min/{1.73_m2} (ref >59)

## 2023-03-20 ENCOUNTER — Ambulatory Visit
Admission: RE | Admit: 2023-03-20 | Discharge: 2023-03-20 | Disposition: A | Discharge status: Home / Self Care | Payer: Medicare PPO | Source: Ambulatory Visit | Attending: Urology | Admitting: Urology

## 2023-03-20 DIAGNOSIS — C61 Malignant neoplasm of prostate: Secondary | ICD-10-CM | POA: Insufficient documentation

## 2023-03-20 MED ORDER — GADOBUTROL 1 MMOL/ML IV SOLN
8.0000 mL | Freq: Once | INTRAVENOUS | Status: AC | PRN
  Administered 2023-03-20: 8 mL via INTRAVENOUS

## 2023-03-22 ENCOUNTER — Telehealth: Payer: Self-pay

## 2023-03-22 ENCOUNTER — Ambulatory Visit: Payer: Medicare PPO | Admitting: Family Medicine

## 2023-03-22 NOTE — Telephone Encounter (Signed)
Called patient to advised him of provider's message that colonoscopy and lung screening referrals were place 03/16/23. Patient states that he contacted [facility] to schedule but stated that they are unable to schedule because there is not an order for the test. Patient states he was told that referrals and orders are different and needs an order from provider to the lung screening and the colonoscopy. Please advise patient.

## 2023-03-22 NOTE — Telephone Encounter (Signed)
Copied from CRM [id number]. Topic: General - Other >> Mar 22, 2023 12:15 PM Carrielelia G wrote: Reason for CRM:  patient is stating that orders are needed for: CT and  Colonoscopy

## 2023-03-22 NOTE — Telephone Encounter (Signed)
Referrals for both the colonoscopy and lung cancer screening chest CT were placed on 03/16/23.   Ronnald Ramp, MD  [facility]

## 2023-03-23 ENCOUNTER — Other Ambulatory Visit: Payer: Self-pay | Admitting: *Deleted

## 2023-03-23 DIAGNOSIS — Z122 Encounter for screening for malignant neoplasm of respiratory organs: Secondary | ICD-10-CM

## 2023-03-23 DIAGNOSIS — F17210 Nicotine dependence, cigarettes, uncomplicated: Secondary | ICD-10-CM

## 2023-03-23 DIAGNOSIS — Z87891 Personal history of nicotine dependence: Secondary | ICD-10-CM

## 2023-03-23 LAB — BASIC METABOLIC PANEL
BUN/Creatinine Ratio: 23 (ref 10–24)
BUN: 24 mg/dL (ref 8–27)
CO2: 17 mmol/L — ABNORMAL LOW (ref 20–29)
Calcium: 9.2 mg/dL (ref 8.6–10.2)
Chloride: 105 mmol/L (ref 96–106)
Creatinine, Ser: 1.05 mg/dL (ref 0.76–1.27)
Glucose: 149 mg/dL — ABNORMAL HIGH (ref 70–99)
Potassium: 4.1 mmol/L (ref 3.5–5.2)
Sodium: 138 mmol/L (ref 134–144)
eGFR: 77 mL/min/{1.73_m2} (ref >59)

## 2023-03-24 ENCOUNTER — Encounter: Payer: Self-pay | Admitting: Urology

## 2023-03-24 ENCOUNTER — Ambulatory Visit: Payer: Medicare PPO | Admitting: Urology

## 2023-03-24 VITALS — BP 135/72 | HR 84 | Ht 73.0 in | Wt 165.0 lb

## 2023-03-24 DIAGNOSIS — C61 Malignant neoplasm of prostate: Secondary | ICD-10-CM

## 2023-03-24 DIAGNOSIS — K651 Peritoneal abscess: Secondary | ICD-10-CM

## 2023-03-24 DIAGNOSIS — N412 Abscess of prostate: Secondary | ICD-10-CM

## 2023-03-24 NOTE — Progress Notes (Signed)
Marcelle Overlie Plume,acting as a scribe for Riki Altes, MD.,have documented all relevant documentation on the behalf of Riki Altes, MD,as directed by  Riki Altes, MD while in the presence of Riki Altes, MD.   03/24/2023 9:19 AM   Colin Griffin 1951/06/13 [id number]  Referring provider: Ronnald Ramp, MD [address] [place],  [place] [address]  No chief complaint on file.  Urologic history 1. Pelvic mass Significant firm mass palpated left gluteus which is also palpable on the left pelvic side wall.   2. Urinary retention On tamsulosin Disontinued Solifenacin Did have a staph aureus UTI which may have been prostatitis and could have precipitated the retention  HPI: Colin Griffin is a 72 y.o. male who is returns for follow up.  Initially seen on 03/01/2023 with a UTI and dysuria and subsequently found to have a large ischiorectal abscess and prostatic abscess. He was hospitalized and underwent placement of a pelvic drain by interventional radiology and subsequently underwent transurethral unroofing of the prostatic abscess. 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  MRI of the prostate was performed on 03/20/2023 which showed a PI-RADS 5 lesion in the left peripheral zone and a PI-RADS 4 lesion left anterior peripheral zone and a left anterior fibromuscular stroma. There was evidence of surrounding prostatitis and periprostatic stranding. There was concern of possible extracapsular extension of the PI-RADS 5 lesion with >1.5 cm of capsular contact. The ischiorectal abscess had resolved. No pelvic adenopath was noted.  His pain has resolved He is having some post-void dribbling and split urinary stream No dysuria, fevers, or chills. He is continuing his antibiotic therapy.   PMH: Past Medical History:  Diagnosis Date   Constipation 03/04/2023    Pre-diabetes     Surgical History: Past Surgical History:  Procedure Laterality Date   APPENDECTOMY     IR RADIOLOGIST EVAL & MGMT  03/16/2023   TRANSURETHRAL RESECTION OF PROSTATE N/A 03/03/2023   Procedure: TRANSURETHRAL RESECTION OF THE PROSTATE (TURP)/ UNROOFING OF PROSTATE ABSCESS;  Surgeon: Riki Altes, MD;  Location: [facility] ORS;  Service: Urology;  Laterality: N/A;    Home Medications:  Allergies as of 03/24/2023   No Known Allergies      Medication List        Accurate as of March 24, 2023  9:19 AM. If you have any questions, ask your nurse or doctor.          acetaminophen 325 MG tablet Commonly known as: TYLENOL Take 2 tablets (650 mg total) by mouth every 6 (six) hours as needed for mild pain, fever or headache.   FreeStyle Libre 3 Reader Devi 2 Devices by Does not apply route daily. Use to monitor sensor glucose reading daily   FreeStyle Libre 3 Sensor Misc 2 Devices by Does not apply route daily. Place 1 sensor on the skin every 14 days. Use to check glucose continuously   HYDROcodone-acetaminophen 5-325 MG tablet Commonly known as: NORCO/VICODIN Take 1-2 tablets by mouth every 4 (four) hours as needed for moderate pain or severe pain.   metFORMIN 500 MG 24 hr tablet Commonly known as: GLUCOPHAGE-XR Take 1 tablet (500 mg total) by mouth daily with breakfast.   polyethylene glycol 17 g packet Commonly known as: MIRALAX / GLYCOLAX Take 17 g by mouth daily.   solifenacin 5 MG tablet Commonly known as: VESICARE Take 1 tablet (  5 mg total) by mouth daily.   sulfamethoxazole-trimethoprim 800-160 MG tablet Commonly known as: BACTRIM DS Take 1 tablet by mouth 2 (two) times daily for 28 days.   tamsulosin 0.4 MG Caps capsule Commonly known as: FLOMAX Take 1 capsule (0.4 mg total) by mouth 2 (two) times daily after a meal.   Trulicity 0.75 MG/0.5ML Sopn Generic drug: Dulaglutide Inject 0.75 mg into the skin once a week.        Family  History: Family History  Problem Relation Age of Onset   Atrial fibrillation Mother    Cancer Maternal Grandmother    Diabetes Other     Social History:  reports that he has been smoking cigarettes. He has a 60.00 pack-year smoking history. He has never used smokeless tobacco. He reports that he does not drink alcohol and does not use drugs.   Physical Exam: BP 135/72   Pulse 84   Ht 6\' 1"  (1.854 m)   Wt 165 lb (74.8 kg)   BMI 21.77 kg/m   Constitutional:  Alert and oriented, No acute distress. HEENT: [place] AT Respiratory: Normal respiratory effort, no increased work of breathing. GI: Abdomen is soft, nontender, nondistended, no abdominal masses Skin: No rashes, bruises or suspicious lesions. Neurologic: Grossly intact, no focal deficits, moving all 4 extremities. Psychiatric: Normal mood and affect.  Laboratory Data: Lab Results  Component Value Date   WBC 9.2 03/06/2023   HGB 11.5 (L) 03/06/2023   HCT 35.7 (L) 03/06/2023   MCV 93.5 03/06/2023   PLT 326 03/06/2023    Lab Results  Component Value Date   CREATININE 1.05 03/22/2023    Lab Results  Component Value Date   HGBA1C 11.1 (H) 02/14/2023   Pertinent Imaging: MRI was personally reviewed and interpreted.   EXAM: MR PROSTATE WITHOUT AND WITH CONTRAST   TECHNIQUE: Multiplanar multisequence MRI images were obtained of the pelvis centered about the prostate. Pre and post contrast images were obtained.   CONTRAST:  8mL GADAVIST GADOBUTROL 1 MMOL/ML IV SOLN   COMPARISON:  CT pelvis 03/16/2023   FINDINGS: Prostate: TURP site noted.   Reduced size of the abscess primarily in the right transition zone in the mid gland and apex, currently measuring 1.7 by 1.7 by 0.8 cm (volume = 1.2 cm^3) , and previously measuring 2.5 by 1.9 by 2.1 cm (volume = 5.2 cm^3) on 03/16/2023.   Presumably due to active inflammation, there is substantial infiltration of the peripheral zone of the prostate gland with low T2  signal, although some areas are more confluent. There is also abnormal stranding and hazy low T2 signal surrounding the prostate gland. These are likely a manifestation of prostatitis rather than tumor spread given the active although improving abscess. The presence of substantial active inflammation including abscess and prostatitis in this case substantially adversely affects the positive predictive value of PI-RADS classification. More plainly, the active inflammation could simulate tumor; the PI-RADS classification system does not incorporate modifiers or accommodations for active inflammation.   Region of interest # 1: PI-RADS category 5 lesion of the left posterolateral and posteromedial peripheral zone extending from base through the apex, with focally reduced T2 signal (image 14, series 6), as well as focally reduced ADC map activity and restricted diffusion (image 14 of series 8 and 9). This measures 3.98 cc (2.2 by 1.6 by 2.1 cm) and has greater than 1.5 cm of capsular contact, which increases risk of occult transcapsular spread.   Region of interest # 2: PI-RADS category  4 lesion of the left anterior peripheral zone and left anterior fibromuscular stroma, with focally reduced T2 signal (image 43, series 10) corresponding to reduced ADC map activity and restricted diffusion (image 14 of series 8 and 9). This measures 0.35 cc (1.4 by 0.5 by 0.8 cm).   Surrounding the right prostatic abscess, there is low T2 signal in an infiltrative manner which is most likely to be inflammatory, and which extends to the prostate apex. There is low-grade associated restriction of diffusion. The infiltration is ill-defined nonfocal but more confluent at the prostate apex. Because of the associated abscess, this is probably inflammatory and is considered PI-RADS category 2. However, negative predictive value is adversely affected.   Volume: 3D volumetric analysis: Prostate volume 40.82 cc  (4.9 by 3.9 by 4.6 cm).   Transcapsular spread: Accentuated risk of occult transcapsular spread associated with region of interest # 1 given greater than 1.5 cm of capsular contact.   Seminal vesicle involvement: Faintly accentuated enhancement in the left seminal vesicle noted, primarily on delayed images, favor inflammation over seminal vesicle tumor.   Neurovascular bundle involvement: Obscured, left greater than right, probably due to prostatic inflammation, less likely from tumor.   Pelvic adenopathy: Small reactive external iliac lymph nodes are not pathologically enlarged.   Bone metastasis: Absent   Other findings: Prior drain in the left ischiorectal fossa has been removed, no recurrence of ischiorectal fossa abscess. Periprostatic and perirectal inflammatory stranding.   IMPRESSION: 1. PI-RADS category 5 lesion in the left peripheral zone and PI-RADS category 4 lesion in the left anterior peripheral zone and left anterior fibromuscular stroma. Targeting data sent to [facility]. 2. Reduced size of the abscess in the right prostate gland, previously 5.2 cc on 03/16/2023, and currently 1.2 cc. Continued surrounding prostatitis and periprostatic stranding. 3. Abnormal low T2 signal in the peripheral zone of the right prostate gland, especially along the apex, likely due to active inflammation. There is also some stranding and hazy low T2 signal surrounding the prostate gland, likely due to active inflammation. 4. Accentuated risk of occult transcapsular spread associated with region of interest # 1 given greater than 1.5 cm of capsular contact 5. Obscured neurovascular bundle, left greater than right, probably due to prostatic inflammation, less likely from tumor. 6. Prior drain in the left ischiorectal fossa has been removed, no recurrence of ischiorectal fossa abscess. 7. Mild prostatomegaly. 8. Faintly accentuated enhancement in the left seminal vesicle, favor  inflammation over seminal vesicle tumor.     Electronically Signed   By: Gaylyn Rong M.D.   On: 03/21/2023 11:56  Assessment & Plan:    1. Left pelvic abscess Status post drainage by IR with resolution on recent CT/MRI  2. Prostatic abscess Recent MRI shows significant decrease in abscess size  3. Prostate cancer Gleason 3+4 adenocarcinoma in submitted prostate chips from unroofing prostatic abscess Prostate MRI with PI-RADS 4-5 lesions Discuss possible need for formal biopsy to make sure there is not a higher grade of cancer present We discussed the standard curative treatments for prostate cancer, including robotic-assisted radical prostatectomy and radiation modalities. Based on his pelvic and prostatic abscesses, radical prostatectomy may be more complicated. His brother has recommended that he see Dr. Berneice Heinrich in [place] and he has requested an appointment there- referral placed He would also like to see radiation oncology in [facility] to discuss radiation modalities.   [facility] [address], [address] [place], [place] [address] [phone]

## 2023-03-27 ENCOUNTER — Encounter: Payer: Self-pay | Admitting: *Deleted

## 2023-03-27 ENCOUNTER — Telehealth: Payer: Self-pay

## 2023-03-27 ENCOUNTER — Other Ambulatory Visit: Payer: Self-pay

## 2023-03-27 DIAGNOSIS — Z1211 Encounter for screening for malignant neoplasm of colon: Secondary | ICD-10-CM

## 2023-03-27 DIAGNOSIS — Z800 Family history of malignant neoplasm of digestive organs: Secondary | ICD-10-CM

## 2023-03-27 MED ORDER — NA SULFATE-K SULFATE-MG SULF 17.5-3.13-1.6 GM/177ML PO SOLN: 1.0000 | Freq: Once | ORAL | 0 refills | Status: AC

## 2023-03-27 NOTE — Addendum Note (Signed)
Addended by: Avie Arenas on: 03/27/2023 03:25 PM   Modules accepted: Orders

## 2023-03-27 NOTE — Telephone Encounter (Signed)
Gastroenterology Pre-Procedure Review  Request Date: 04/12/23 Requesting Physician: Dr. Allegra Lai  PATIENT REVIEW QUESTIONS: The patient responded to the following health history questions as indicated:    1. Are you having any GI issues? no 2. Do you have a personal history of Polyps? no 3. Do you have a family history of Colon Cancer or Polyps? yes (twin sister had stage 3 colon cancer) 4. Diabetes Mellitus? yes (has been advised to stop Metformin on 04/10/23.  Pt has not started Trulicity) 5. Joint replacements in the past 12 months?no 6. Major health problems in the past 3 months? Pelvic abscess 03/21/23 7. Any artificial heart valves, MVP, or defibrillator?no    MEDICATIONS & ALLERGIES:    Patient reports the following regarding taking any anticoagulation/antiplatelet therapy:   Plavix, Coumadin, Eliquis, Xarelto, Lovenox, Pradaxa, Brilinta, or Effient? no Aspirin? no  Patient confirms/reports the following medications:  Current Outpatient Medications  Medication Sig Dispense Refill   acetaminophen (TYLENOL) 325 MG tablet Take 2 tablets (650 mg total) by mouth every 6 (six) hours as needed for mild pain, fever or headache.     Continuous Blood Gluc Sensor (FREESTYLE LIBRE 3 SENSOR) MISC 2 Devices by Does not apply route daily. Place 1 sensor on the skin every 14 days. Use to check glucose continuously 2 each 6   Continuous Glucose Receiver (FREESTYLE LIBRE 3 READER) DEVI 2 Devices by Does not apply route daily. Use to monitor sensor glucose reading daily 2 each 6   Dulaglutide (TRULICITY) 0.75 MG/0.5ML SOPN Inject 0.75 mg into the skin once a week. 2 mL 3   HYDROcodone-acetaminophen (NORCO/VICODIN) 5-325 MG tablet Take 1-2 tablets by mouth every 4 (four) hours as needed for moderate pain or severe pain. 30 tablet 0   metFORMIN (GLUCOPHAGE-XR) 500 MG 24 hr tablet Take 1 tablet (500 mg total) by mouth daily with breakfast. 90 tablet 1   polyethylene glycol (MIRALAX / GLYCOLAX) 17 g packet  Take 17 g by mouth daily. 30 each 1   solifenacin (VESICARE) 5 MG tablet Take 1 tablet (5 mg total) by mouth daily. 30 tablet 0   sulfamethoxazole-trimethoprim (BACTRIM DS) 800-160 MG tablet Take 1 tablet by mouth 2 (two) times daily for 28 days. 56 tablet 0   tamsulosin (FLOMAX) 0.4 MG CAPS capsule Take 1 capsule (0.4 mg total) by mouth 2 (two) times daily after a meal. 60 capsule 0   No current facility-administered medications for this visit.    Patient confirms/reports the following allergies:  No Known Allergies  No orders of the defined types were placed in this encounter.   AUTHORIZATION INFORMATION Primary Insurance: 1D#: Group #:  Secondary Insurance: 1D#: Group #:  SCHEDULE INFORMATION: Date: 04/12/23 Time: Location: [facility]

## 2023-03-27 NOTE — Telephone Encounter (Signed)
Pt left message to schedule colonoscopy please return call  

## 2023-03-29 ENCOUNTER — Ambulatory Visit (INDEPENDENT_AMBULATORY_CARE_PROVIDER_SITE_OTHER): Payer: Medicare PPO | Admitting: Physician Assistant

## 2023-03-29 ENCOUNTER — Encounter: Payer: Self-pay | Admitting: Physician Assistant

## 2023-03-29 DIAGNOSIS — F17210 Nicotine dependence, cigarettes, uncomplicated: Secondary | ICD-10-CM | POA: Diagnosis not present

## 2023-03-29 NOTE — Patient Instructions (Signed)
Thank you for participating in the [facility]. It was our pleasure to meet you today. We will call you with the results of your scan within the next few days. Your scan will be assigned a Lung RADS category score by the physicians reading the scans.  This Lung RADS score determines follow up scanning.  See below for description of categories, and follow up screening recommendations. We will be in touch to schedule your follow up screening annually or based on recommendations of our providers. We will fax a copy of your scan results to your Primary Care Physician, or the physician who referred you to the program, to ensure they have the results. Please call the office if you have any questions or concerns regarding your scanning experience or results.  Our office number is [phone]. Please speak with Denise Phelps, RN. , or  Denise Buckner RN, They are  our Lung Cancer Screening RN.'s If They are unavailable when you call, Please leave a message on the voice mail. We will return your call at our earliest convenience.This voice mail is monitored several times a day.  Remember, if your scan is normal, we will scan you annually as long as you continue to meet the criteria for the program. (Age 50-80, Current smoker or smoker who has quit within the last 15 years). If you are a smoker, remember, quitting is the single most powerful action that you can take to decrease your risk of lung cancer and other pulmonary, breathing related problems. We know quitting is hard, and we are here to help.  Please let us know if there is anything we can do to help you meet your goal of quitting. If you are a former smoker, congratulations. We are proud of you! Remain smoke free! Remember you can refer friends or family members through the number above.  We will screen them to make sure they meet criteria for the program. Thank you for helping us take better care of you by  participating in Lung Screening.  You can receive free nicotine replacement therapy ( patches, gum or mints) by calling 1-800-QUIT NOW. Please call so we can get you on the path to becoming  a non-smoker. I know it is hard, but you can do this!  Lung RADS Categories:  Lung RADS 1: no nodules or definitely non-concerning nodules.  Recommendation is for a repeat annual scan in 12 months.  Lung RADS 2:  nodules that are non-concerning in appearance and behavior with a very low likelihood of becoming an active cancer. Recommendation is for a repeat annual scan in 12 months.  Lung RADS 3: nodules that are probably non-concerning , includes nodules with a low likelihood of becoming an active cancer.  Recommendation is for a 6-month repeat screening scan. Often noted after an upper respiratory illness. We will be in touch to make sure you have no questions, and to schedule your 6-month scan.  Lung RADS 4 A: nodules with concerning findings, recommendation is most often for a follow up scan in 3 months or additional testing based on our provider's assessment of the scan. We will be in touch to make sure you have no questions and to schedule the recommended 3 month follow up scan.  Lung RADS 4 B:  indicates findings that are concerning. We will be in touch with you to schedule additional diagnostic testing based on our provider's  assessment of the scan.  Other options for assistance in smoking cessation (   As covered by your insurance benefits)  Hypnosis for smoking cessation  [employer] [phone]  Acupuncture for smoking cessation  [facility] [phone]   

## 2023-03-29 NOTE — Progress Notes (Signed)
Virtual Visit via Telephone Note  I connected with Colin Griffin on 03/29/23 at 11:30 AM EDT by telephone and verified that I am speaking with the correct person using two identifiers.  Location: Patient: home Provider: working virtually from home   I discussed the limitations, risks, security and privacy concerns of performing an evaluation and management service by telephone and the availability of in person appointments. I also discussed with the patient that there may be a patient responsible charge related to this service. The patient expressed understanding and agreed to proceed        Shared Decision Making Visit Lung Cancer Screening Program [phone])   Eligibility: Age 72 y.o. Pack Years Smoking History Calculation 53 (# packs/per year x # years smoked) Recent History of coughing up blood  no Unexplained weight loss? no ( >Than 15 pounds within the last 6 months ) Prior History Lung / other cancer yes (Diagnosis within the last 5 years already requiring surveillance chest CT Scans). Smoking Status Current Smoker  Visit Components: Discussion included one or more decision making aids. yes Discussion included risk/benefits of screening. yes Discussion included potential follow up diagnostic testing for abnormal scans. yes Discussion included meaning and risk of over diagnosis. yes Discussion included meaning and risk of False Positives. yes Discussion included meaning of total radiation exposure. yes  Counseling Included: Importance of adherence to annual lung cancer LDCT screening. yes Impact of comorbidities on ability to participate in the program. yes Ability and willingness to under diagnostic treatment. yes  Smoking Cessation Counseling: Current Smokers:  Discussed importance of smoking cessation. Pt had phone malfunction Information about tobacco cessation classes and interventions provided to patient. yes Patient provided with "ticket" for LDCT  Scan. N/a Symptomatic Patient. no Diagnosis Code: Tobacco Use Z72.0 Asymptomatic Patient yes Diagnosis Code: Personal History of Nicotine Dependence. U04.540 Information about tobacco cessation classes and interventions provided to patient. Yes Written Order for Lung Cancer Screening with LDCT placed in Epic. Yes (CT Chest Lung Cancer Screening Low Dose W/O CM) [id number] Z12.2-Screening of respiratory organs Z87.891-Personal history of nicotine dependence   I have spent 25 minutes of face to face/ virtual visit   time with the patient discussing the risks and benefits of lung cancer screening. We viewed / discussed a power point together that explained in detail the above noted topics. We paused at intervals to allow for questions to be asked and answered to ensure understanding.We discussed that the single most powerful action that he can take to decrease his risk of developing lung cancer is to quit smoking. We discussed whether or not he is ready to commit to setting a quit date. We discussed options for tools to aid in quitting smoking including nicotine replacement therapy, non-nicotine medications, support groups, Quit Smart classes, and behavior modification. We discussed that often times setting smaller, more achievable goals, such as eliminating 1 cigarette a day for a week and then 2 cigarettes a day for a week can be helpful in slowly decreasing the number of cigarettes smoked. This allows for a sense of accomplishment as well as providing a clinical benefit. I provided  him  with smoking cessation  information  with contact information for community resources, classes, free nicotine replacement therapy, and access to mobile apps, text messaging, and on-line smoking cessation help. I have also provided  he  the office contact information in the event he needs to contact me, or the screening staff. We discussed the time and location of  the scan, and that either Abigail Miyamoto RN, Karlton Lemon,  RN  or I will call / send a letter with the results within 24-72 hours of receiving them. The patient verbalized understanding of all of  the above and had no further questions upon leaving the office. They have my contact information in the event they have any further questions.   I explained to the patient that there has been a high incidence of coronary artery disease noted on these exams. I explained that this is a non-gated exam therefore degree or severity cannot be determined. This patient is not on statin therapy. I have asked the patient to follow-up with their PCP regarding any incidental finding of coronary artery disease and management with diet or medication as their PCP  feels is clinically indicated. The patient verbalized understanding of the above and had no further questions upon completion of the visit.      Darcella Gasman Coriann Brouhard, PA-C

## 2023-03-30 ENCOUNTER — Ambulatory Visit
Admission: RE | Admit: 2023-03-30 | Discharge: 2023-03-30 | Disposition: A | Discharge status: Home / Self Care | Payer: Medicare PPO | Source: Ambulatory Visit | Attending: Acute Care | Admitting: Acute Care

## 2023-03-30 DIAGNOSIS — Z122 Encounter for screening for malignant neoplasm of respiratory organs: Secondary | ICD-10-CM | POA: Diagnosis present

## 2023-03-30 DIAGNOSIS — Z87891 Personal history of nicotine dependence: Secondary | ICD-10-CM | POA: Diagnosis present

## 2023-03-30 DIAGNOSIS — F17210 Nicotine dependence, cigarettes, uncomplicated: Secondary | ICD-10-CM

## 2023-04-05 ENCOUNTER — Inpatient Hospital Stay: Payer: Medicare PPO

## 2023-04-05 ENCOUNTER — Ambulatory Visit
Admission: RE | Admit: 2023-04-05 | Discharge: 2023-04-05 | Disposition: A | Discharge status: Home / Self Care | Payer: Medicare PPO | Source: Ambulatory Visit | Attending: Radiation Oncology | Admitting: Radiation Oncology

## 2023-04-05 ENCOUNTER — Encounter: Payer: Self-pay | Admitting: Radiation Oncology

## 2023-04-05 ENCOUNTER — Other Ambulatory Visit: Payer: Self-pay | Admitting: *Deleted

## 2023-04-05 VITALS — BP 127/70 | HR 67 | Temp 96.7°F | Resp 16 | Ht 73.0 in | Wt 172.0 lb

## 2023-04-05 DIAGNOSIS — K5900 Constipation, unspecified: Secondary | ICD-10-CM | POA: Diagnosis not present

## 2023-04-05 DIAGNOSIS — Z809 Family history of malignant neoplasm, unspecified: Secondary | ICD-10-CM | POA: Diagnosis not present

## 2023-04-05 DIAGNOSIS — C61 Malignant neoplasm of prostate: Secondary | ICD-10-CM | POA: Diagnosis present

## 2023-04-05 DIAGNOSIS — F17210 Nicotine dependence, cigarettes, uncomplicated: Secondary | ICD-10-CM | POA: Insufficient documentation

## 2023-04-05 DIAGNOSIS — Z79899 Other long term (current) drug therapy: Secondary | ICD-10-CM | POA: Diagnosis not present

## 2023-04-05 LAB — PSA: Prostatic Specific Antigen: 2.98 ng/mL (ref 0.00–4.00)

## 2023-04-05 NOTE — Consult Note (Signed)
NEW PATIENT EVALUATION  Name: Colin Griffin  MRN: [id number]  Date:   04/05/2023     DOB: 21-Jul-1951   This 72 y.o. male patient presents to the clinic for initial evaluation of Gleason 7 (3+4 adenocarcinoma the prostate discovered at the time of a transurethral unroofing of the prostate secondary to abscess formation with positive chips found.Marland Kitchen  REFERRING PHYSICIAN: Simmons-Robinson, Makie*  CHIEF COMPLAINT:  Chief Complaint  Patient presents with   Prostate Cancer    Follow up    DIAGNOSIS: The encounter diagnosis was Malignant neoplasm of prostate (HCC).   PREVIOUS INVESTIGATIONS:  MRI scan CT scans reviewed Clinical notes reviewed Pathology reports reviewed PSA ordered  HPI: Patient is a 72 year old male who was initially seen in early April presenting with a UTI and dysuria.  He was found to have an ischial rectal abscess and prostatic abscess most likely originating from the prostate.  He had a pelvic drain placed by IR and a transurethral unroofing of the prostate abscess with prostate chips showing Gleason 7 (3+4 adenocarcinoma the prostate.  MRI scan of his prostate showed a PI-RADS category 5 lesion in the left peripheral zone and a PI-RADS category 4 lesion left anterior peripheral zone.  He had reduction in size of the abscess of the right proximal prostate gland.  He did have some stranding and low T2 signal around the prostate gland likely due to inflammation although those accentuated risk of occult trans capsular spread in the area of 1.5 cm of capsular contact.  He is still on Bactrim antibiotics.  He has not yet had a PSA drawn.  CT scan of his chest did show some bilateral solid pulmonary nodules lung RADS 3 probably benign but short-term CT follow-up recommended.  PLANNED TREATMENT REGIMEN: Image guided IMRT radiation therapy  PAST MEDICAL HISTORY:  has a past medical history of Constipation (03/04/2023) and Pre-diabetes.    PAST SURGICAL HISTORY:  Past Surgical  History:  Procedure Laterality Date   APPENDECTOMY     IR RADIOLOGIST EVAL & MGMT  03/16/2023   TRANSURETHRAL RESECTION OF PROSTATE N/A 03/03/2023   Procedure: TRANSURETHRAL RESECTION OF THE PROSTATE (TURP)/ UNROOFING OF PROSTATE ABSCESS;  Surgeon: Riki Altes, MD;  Location: [facility] ORS;  Service: Urology;  Laterality: N/A;    FAMILY HISTORY: family history includes Atrial fibrillation in his mother; Cancer in his maternal grandmother; Diabetes in an other family member.  SOCIAL HISTORY:  reports that he has been smoking cigarettes. He has a 53.00 pack-year smoking history. He has never used smokeless tobacco. He reports that he does not drink alcohol and does not use drugs.  ALLERGIES: Patient has no known allergies.  MEDICATIONS:  Current Outpatient Medications  Medication Sig Dispense Refill   acetaminophen (TYLENOL) 325 MG tablet Take 2 tablets (650 mg total) by mouth every 6 (six) hours as needed for mild pain, fever or headache.     Continuous Blood Gluc Sensor (FREESTYLE LIBRE 3 SENSOR) MISC 2 Devices by Does not apply route daily. Place 1 sensor on the skin every 14 days. Use to check glucose continuously 2 each 6   Continuous Glucose Receiver (FREESTYLE LIBRE 3 READER) DEVI 2 Devices by Does not apply route daily. Use to monitor sensor glucose reading daily 2 each 6   Dulaglutide (TRULICITY) 0.75 MG/0.5ML SOPN Inject 0.75 mg into the skin once a week. 2 mL 3   HYDROcodone-acetaminophen (NORCO/VICODIN) 5-325 MG tablet Take 1-2 tablets by mouth every 4 (four) hours as needed for  moderate pain or severe pain. 30 tablet 0   metFORMIN (GLUCOPHAGE-XR) 500 MG 24 hr tablet Take 1 tablet (500 mg total) by mouth daily with breakfast. 90 tablet 1   polyethylene glycol (MIRALAX / GLYCOLAX) 17 g packet Take 17 g by mouth daily. 30 each 1   solifenacin (VESICARE) 5 MG tablet Take 1 tablet (5 mg total) by mouth daily. 30 tablet 0   tamsulosin (FLOMAX) 0.4 MG CAPS capsule Take 1 capsule (0.4 mg  total) by mouth 2 (two) times daily after a meal. 60 capsule 0   No current facility-administered medications for this encounter.    ECOG PERFORMANCE STATUS:  1 - Symptomatic but completely ambulatory  REVIEW OF SYSTEMS: Patient denies any weight loss, fatigue, weakness, fever, chills or night sweats. Patient denies any loss of vision, blurred vision. Patient denies any ringing  of the ears or hearing loss. No irregular heartbeat. Patient denies heart murmur or history of fainting. Patient denies any chest pain or pain radiating to her upper extremities. Patient denies any shortness of breath, difficulty breathing at night, cough or hemoptysis. Patient denies any swelling in the lower legs. Patient denies any nausea vomiting, vomiting of blood, or coffee ground material in the vomitus. Patient denies any stomach pain. Patient states has had normal bowel movements no significant constipation or diarrhea. Patient denies any dysuria, hematuria or significant nocturia. Patient denies any problems walking, swelling in the joints or loss of balance. Patient denies any skin changes, loss of hair or loss of weight. Patient denies any excessive worrying or anxiety or significant depression. Patient denies any problems with insomnia. Patient denies excessive thirst, polyuria, polydipsia. Patient denies any swollen glands, patient denies easy bruising or easy bleeding. Patient denies any recent infections, allergies or URI. Patient "s visual fields have not changed significantly in recent time.   PHYSICAL EXAM: BP 127/70   Pulse 67   Temp (!) 96.7 F (35.9 C)   Resp 16   Ht 6\' 1"  (1.854 m)   Wt 172 lb (78 kg)   BMI 22.69 kg/m  Well-developed well-nourished patient in NAD. HEENT reveals PERLA, EOMI, discs not visualized.  Oral cavity is clear. No oral mucosal lesions are identified. Neck is clear without evidence of cervical or supraclavicular adenopathy. Lungs are clear to A&P. Cardiac examination is  essentially unremarkable with regular rate and rhythm without murmur rub or thrill. Abdomen is benign with no organomegaly or masses noted. Motor sensory and DTR levels are equal and symmetric in the upper and lower extremities. Cranial nerves II through XII are grossly intact. Proprioception is intact. No peripheral adenopathy or edema is identified. No motor or sensory levels are noted. Crude visual fields are within normal range.  LABORATORY DATA: Pathology reports reviewed    RADIOLOGY RESULTS: CT scans of the chest MRI of his prostate reviewed compatible with above-stated findings   IMPRESSION: Gleason 7 (3+4) adenocarcinoma the prostate in 72 year old male  PLAN: This time of ordered a PSA.  That we will determine whether he needs further imaging such as a PSMA PET scan.  If PSA is below 10 would proceed with image guided IMRT radiation therapy 80 Gray over 8 weeks to his prostate.  I would like Stoioff to place fiducial markers of his prostate for daily image guided treatment as well as a 54-month Eligard injection.  Risks and benefits of radiation including exacerbation of his lower urinary tract symptoms diarrhea fatigue alteration blood counts skin reaction all were reviewed in detail  with the patient.  Patient has a second opinion at [facility] for possible laparoscopic surgery and we will put off any plans on placing markers until after that appointment so the patient can make a formal decision.  Based on his PSA we will make further recommendations.  Patient comprehends my treatment plan well.  I would like to take this opportunity to thank you for allowing me to participate in the care of your patient.Carmina Miller, MD

## 2023-04-06 ENCOUNTER — Telehealth: Payer: Self-pay

## 2023-04-06 NOTE — Telephone Encounter (Signed)
No PA required for Eligard. Pt ready for scheduling.

## 2023-04-06 NOTE — Telephone Encounter (Signed)
-----   Message from Dorcas Carrow, RN sent at 04/06/2023  7:51 AM EDT ----- Regarding: Markers and Eligard Good Morning,   This patient will need to have Markers placed and an Eligard injection.  He has an appointment with [facility] during the first week of June, Dr. C request the appointment be made around the 2nd week of June.    Thanks,  Ricki Rodriguez Radiation Oncology

## 2023-04-11 ENCOUNTER — Encounter: Payer: Self-pay | Admitting: Gastroenterology

## 2023-04-11 NOTE — Anesthesia Preprocedure Evaluation (Signed)
Anesthesia Evaluation  Patient identified by MRN, date of birth, ID band Patient awake    Reviewed: Allergy & Precautions, H&P , NPO status , Patient's Chart, lab work & pertinent test results  Airway Mallampati: II  TM Distance: >3 FB Neck ROM: full    Dental no notable dental hx.    Pulmonary Current Smoker and Patient abstained from smoking.   Pulmonary exam normal        Cardiovascular negative cardio ROS Normal cardiovascular exam     Neuro/Psych negative neurological ROS  negative psych ROS   GI/Hepatic negative GI ROS, Neg liver ROS,,,  Endo/Other  diabetes, Poorly Controlled, Type 2  A1c 11  Renal/GU      Musculoskeletal   Abdominal Normal abdominal exam  (+)   Peds  Hematology  (+) Blood dyscrasia, anemia   Anesthesia Other Findings Past Medical History: No date: Pre-diabetes  Past Surgical History: No date: APPENDECTOMY  BMI    Body Mass Index: 23.09 kg/m      Reproductive/Obstetrics negative OB ROS                              Anesthesia Physical Anesthesia Plan  ASA: 3  Anesthesia Plan: General   Post-op Pain Management:    Induction: Intravenous  PONV Risk Score and Plan: 2 and Treatment may vary due to age or medical condition, TIVA and Propofol infusion  Airway Management Planned: Natural Airway  Additional Equipment:   Intra-op Plan:   Post-operative Plan:   Informed Consent:      Dental Advisory Given  Plan Discussed with: CRNA and Surgeon  Anesthesia Plan Comments:          Anesthesia Quick Evaluation

## 2023-04-12 ENCOUNTER — Encounter
Admission: RE | Disposition: A | Discharge status: Home / Self Care | Payer: Self-pay | Source: Home / Self Care | Attending: Gastroenterology

## 2023-04-12 ENCOUNTER — Other Ambulatory Visit: Payer: Self-pay

## 2023-04-12 ENCOUNTER — Ambulatory Visit: Payer: Medicare PPO | Admitting: Anesthesiology

## 2023-04-12 ENCOUNTER — Ambulatory Visit
Admission: RE | Admit: 2023-04-12 | Discharge: 2023-04-12 | Disposition: A | Discharge status: Home / Self Care | Payer: Medicare PPO | Attending: Gastroenterology | Admitting: Gastroenterology

## 2023-04-12 ENCOUNTER — Encounter: Payer: Self-pay | Admitting: Gastroenterology

## 2023-04-12 DIAGNOSIS — D120 Benign neoplasm of cecum: Secondary | ICD-10-CM | POA: Diagnosis not present

## 2023-04-12 DIAGNOSIS — Z9049 Acquired absence of other specified parts of digestive tract: Secondary | ICD-10-CM | POA: Diagnosis not present

## 2023-04-12 DIAGNOSIS — D124 Benign neoplasm of descending colon: Secondary | ICD-10-CM

## 2023-04-12 DIAGNOSIS — Z7984 Long term (current) use of oral hypoglycemic drugs: Secondary | ICD-10-CM | POA: Diagnosis not present

## 2023-04-12 DIAGNOSIS — Z800 Family history of malignant neoplasm of digestive organs: Secondary | ICD-10-CM | POA: Diagnosis not present

## 2023-04-12 DIAGNOSIS — Z1211 Encounter for screening for malignant neoplasm of colon: Secondary | ICD-10-CM | POA: Diagnosis not present

## 2023-04-12 DIAGNOSIS — Z7985 Long-term (current) use of injectable non-insulin antidiabetic drugs: Secondary | ICD-10-CM | POA: Insufficient documentation

## 2023-04-12 DIAGNOSIS — D125 Benign neoplasm of sigmoid colon: Secondary | ICD-10-CM | POA: Diagnosis not present

## 2023-04-12 DIAGNOSIS — K635 Polyp of colon: Secondary | ICD-10-CM

## 2023-04-12 DIAGNOSIS — E1165 Type 2 diabetes mellitus with hyperglycemia: Secondary | ICD-10-CM | POA: Insufficient documentation

## 2023-04-12 DIAGNOSIS — D122 Benign neoplasm of ascending colon: Secondary | ICD-10-CM

## 2023-04-12 HISTORY — PX: COLONOSCOPY WITH PROPOFOL: SHX5780

## 2023-04-12 SURGERY — COLONOSCOPY WITH PROPOFOL
Anesthesia: General

## 2023-04-12 MED ORDER — PROPOFOL 1000 MG/100ML IV EMUL
INTRAVENOUS | Status: AC
  Filled 2023-04-12: qty 100

## 2023-04-12 MED ORDER — PHENYLEPHRINE 80 MCG/ML (10ML) SYRINGE FOR IV PUSH (FOR BLOOD PRESSURE SUPPORT)
PREFILLED_SYRINGE | INTRAVENOUS | Status: AC
  Filled 2023-04-12: qty 10

## 2023-04-12 MED ORDER — PROPOFOL 500 MG/50ML IV EMUL
INTRAVENOUS | Status: DC | PRN
  Administered 2023-04-12: 189.445 ug/kg/min via INTRAVENOUS

## 2023-04-12 MED ORDER — LIDOCAINE HCL (CARDIAC) PF 100 MG/5ML IV SOSY
PREFILLED_SYRINGE | INTRAVENOUS | Status: DC | PRN
  Administered 2023-04-12: 100 mg via INTRAVENOUS

## 2023-04-12 MED ORDER — GLUCAGON HCL RDNA (DIAGNOSTIC) 1 MG IJ SOLR
INTRAMUSCULAR | Status: AC
  Filled 2023-04-12: qty 1

## 2023-04-12 MED ORDER — PROPOFOL 10 MG/ML IV BOLUS
INTRAVENOUS | Status: DC | PRN
  Administered 2023-04-12: 80 mg via INTRAVENOUS

## 2023-04-12 MED ORDER — METHYLENE BLUE 1 % INJ SOLN
INTRAVENOUS | Status: DC | PRN
  Administered 2023-04-12: 9 mL via SUBMUCOSAL

## 2023-04-12 MED ORDER — SPOT INK MARKER SYRINGE KIT
PACK | SUBMUCOSAL | Status: DC | PRN
  Administered 2023-04-12: 1 mL via SUBMUCOSAL
  Administered 2023-04-12: 4 mL via SUBMUCOSAL

## 2023-04-12 MED ORDER — GLUCAGON HCL (RDNA) 1 MG IJ SOLR
INTRAMUSCULAR | Status: DC | PRN
  Administered 2023-04-12: 1 mg via INTRAVENOUS

## 2023-04-12 MED ORDER — SODIUM CHLORIDE 0.9 % IV SOLN: INTRAVENOUS | Status: DC

## 2023-04-12 NOTE — Transfer of Care (Signed)
Immediate Anesthesia Transfer of Care Note  Patient: Colin Griffin  Procedure(s) Performed: COLONOSCOPY WITH PROPOFOL  Patient Location: Endoscopy Unit  Anesthesia Type:General  Level of Consciousness: drowsy  Airway & Oxygen Therapy: Patient Spontanous Breathing  Post-op Assessment: Report given to RN and Post -op Vital signs reviewed and stable  Post vital signs: Reviewed and stable  Last Vitals: see EPIC flowsheets data Vitals Value Taken Time  BP    Temp    Pulse    Resp    SpO2      Last Pain:  Vitals:   04/12/23 1145  TempSrc: Temporal  PainSc: Asleep         Complications: No notable events documented.

## 2023-04-12 NOTE — Op Note (Signed)
[facility]astroenterology Patient Name: Colin Griffin Procedure Date: 04/12/2023 10:06 AM MRN: [id number] Account #: [id number] Date of Birth: February 23, 1951 Admit Type: Outpatient Age: 72 Room: [facility] ENDO ROOM 4 Gender: Male Note Status: Finalized Instrument Name: Prentice Docker [id number] Procedure:             Colonoscopy Indications:           Screening for colorectal malignant neoplasm, Screening                         in patient at increased risk: Colorectal cancer in                         sister 48 or older, This is the patient's first                         colonoscopy Providers:             [name] MD, MD Referring MD:          No Local Md, MD (Referring MD) Medicines:             General Anesthesia Complications:         No immediate complications. Estimated blood loss: None. Procedure:             Pre-Anesthesia Assessment:                        - Prior to the procedure, a History and Physical was                         performed, and patient medications and allergies were                         reviewed. The patient is competent. The risks and                         benefits of the procedure and the sedation options and                         risks were discussed with the patient. All questions                         were answered and informed consent was obtained.                         Patient identification and proposed procedure were                         verified by the physician, the nurse, the                         anesthesiologist, the anesthetist and the technician                         in the pre-procedure area in the procedure room in the                         endoscopy suite. Mental Status Examination: alert and  oriented. Airway Examination: normal oropharyngeal                         airway and neck mobility. Respiratory Examination:                         clear to auscultation. CV  Examination: normal.                         Prophylactic Antibiotics: The patient does not require                         prophylactic antibiotics. Prior Anticoagulants: The                         patient has taken no anticoagulant or antiplatelet                         agents. ASA Grade Assessment: III - A patient with                         severe systemic disease. After reviewing the risks and                         benefits, the patient was deemed in satisfactory                         condition to undergo the procedure. The anesthesia                         plan was to use general anesthesia. Immediately prior                         to administration of medications, the patient was                         re-assessed for adequacy to receive sedatives. The                         heart rate, respiratory rate, oxygen saturations,                         blood pressure, adequacy of pulmonary ventilation, and                         response to care were monitored throughout the                         procedure. The physical status of the patient was                         re-assessed after the procedure.                        After obtaining informed consent, the colonoscope was                         passed under direct vision. Throughout the procedure,  the patient's blood pressure, pulse, and oxygen                         saturations were monitored continuously. The                         Colonoscope was introduced through the anus and                         advanced to the the cecum, identified by appendiceal                         orifice and ileocecal valve. The colonoscopy was                         performed without difficulty. The patient tolerated                         the procedure well. The quality of the bowel                         preparation was evaluated using the BBPS [facility] Bowel                         Preparation Scale)  with scores of: Right Colon = 3,                         Transverse Colon = 3 and Left Colon = 3 (entire mucosa                         seen well with no residual staining, small fragments                         of stool or opaque liquid). The total BBPS score                         equals 9. The ileocecal valve, appendiceal orifice,                         and rectum were photographed. Findings:      The perianal and digital rectal examinations were normal. Pertinent       negatives include normal sphincter tone and no palpable rectal lesions.      Three sessile polyps were found in the cecum. The polyps were 5 to 7 mm       in size. These polyps were removed with a cold snare. Resection and       retrieval were complete. Estimated blood loss was minimal.      A greater than 50 mm polyp was found in the proximal ascending colon.       The polyp was carpet-like, flat and sessile. Preparations were made for       mucosal resection. Demarcation of the lesion was performed with narrow       band imaging to clearly identify the boundaries of the lesion. 9 mL of       Eleview was injected with adequate lift of the lesion from the       muscularis propria. Snare mucosal resection with suction (  via the       working channel) retrieval was performed. A 50 mm area was resected.       Resection and retrieval were complete. Resected tissue including tissue       margins will be examined by histology. There was no bleeding during and       at the end of the procedure. To prevent bleeding after mucosal       resection, five hemostatic clips were successfully placed (MR safe).       Clip manufacturer: [employer]. There was no bleeding during, or       at the end, of the procedure. Hemostatic gel purastat was applied at the       EMR site.      A greater than 50 mm polyp was found in the distal ascending colon. The       polyp was carpet-like and flat. Polypectomy was not attempted due to        polyp size (too large to be excised). Area was tattooed with an       injection of Spot (carbon black).      A greater than 50 mm polyp was found in the sigmoid colon descending       colon. The polyp was flat. Polypectomy was not attempted due to polyp       size (too large to be excised).      Two sessile polyps were found in the descending colon. The polyps were 9       to 10 mm in size. These polyps were removed with a hot snare. Resection       and retrieval were complete.      A 4 mm polyp was found in the descending colon. The polyp was sessile.       The polyp was removed with a cold snare. Resection and retrieval were       complete.      A 15 mm polyp was found in the sigmoid colon. The polyp was       pedunculated. The polyp was removed with a hot snare. Resection and       retrieval were complete.      The retroflexed view of the distal rectum and anal verge was normal and       showed no anal or rectal abnormalities. Impression:            - Three 5 to 7 mm polyps in the cecum, removed with a                         cold snare. Resected and retrieved.                        - One greater than 50 mm polyp in the proximal                         ascending colon, removed with mucosal resection.                         Resected and retrieved. Clip manufacturer: [employer]. Clips (MR safe) were placed.                        -  One greater than 50 mm polyp in the distal ascending                         colon. Resection not attempted. Tattooed.                        - One greater than 50 mm polyp in the sigmoid colon in                         the descending colon. Resection not attempted.                        - Two 9 to 10 mm polyps in the descending colon,                         removed with a hot snare. Resected and retrieved.                        - One 4 mm polyp in the descending colon, removed with                         a cold snare.  Resected and retrieved.                        - One 15 mm polyp in the sigmoid colon, removed with a                         hot snare. Resected and retrieved.                        - The distal rectum and anal verge are normal on                         retroflexion view.                        - Mucosal resection was performed. Resection and                         retrieval were complete. Recommendation:        - Discharge patient to home (with escort).                        - Resume previous diet today.                        - Continue present medications.                        - Await pathology results.                        - Repeat colonoscopy within 3 months for retreatment.                        - Refer to an advanced gastroenterologist, Dr                         [name] at appointment  to be scheduled for EMR                         (resection) of large unresected polyps. Procedure Code(s):     --- Professional ---                        [phone], Colonoscopy, flexible; with endoscopic mucosal                         resection                        45385, 59, Colonoscopy, flexible; with removal of                         tumor(s), polyp(s), or other lesion(s) by snare                         technique                        45381, 59, Colonoscopy, flexible; with directed                         submucosal injection(s), any substance Diagnosis Code(s):     --- Professional ---                        Z12.11, Encounter for screening for malignant neoplasm                         of colon                        Z80.0, Family history of malignant neoplasm of                         digestive organs                        D12.0, Benign neoplasm of cecum                        D12.4, Benign neoplasm of descending colon                        D12.2, Benign neoplasm of ascending colon                        D12.5, Benign neoplasm of sigmoid colon CPT copyright 2022 American  Medical Association. All rights reserved. The codes documented in this report are preliminary and upon coder review may  be revised to meet current compliance requirements. Dr. Libby Maw [name] MD, MD 04/12/2023 11:50:21 AM This report has been signed electronically. Number of Addenda: 0 Note Initiated On: 04/12/2023 10:06 AM Scope Withdrawal Time: 1 hour 4 minutes 34 seconds  Total Procedure Duration: 1 hour 8 minutes 34 seconds  Estimated Blood Loss:  Estimated blood loss: none.      [facility]

## 2023-04-12 NOTE — Anesthesia Postprocedure Evaluation (Signed)
Anesthesia Post Note  Patient: Colin Griffin  Procedure(s) Performed: COLONOSCOPY WITH PROPOFOL  Patient location during evaluation: Endoscopy Anesthesia Type: General Level of consciousness: awake and alert Pain management: pain level controlled Vital Signs Assessment: post-procedure vital signs reviewed and stable Respiratory status: spontaneous breathing, nonlabored ventilation and respiratory function stable Cardiovascular status: blood pressure returned to baseline and stable Postop Assessment: no apparent nausea or vomiting Anesthetic complications: no   No notable events documented.   Last Vitals:  Vitals:   04/12/23 1145 04/12/23 1155  BP: 123/80 117/72  Pulse:    Resp: 20   Temp: 36.7 C   SpO2:      Last Pain:  Vitals:   04/12/23 1205  TempSrc:   PainSc: 0-No pain                 Foye Deer

## 2023-04-12 NOTE — H&P (Signed)
Arlyss Repress, MD [address]  [address]  [place], [place] [address]  Main: [phone]  Fax: [phone] Pager: [phone]  Primary Care Physician:  Ronnald Ramp, MD Primary Gastroenterologist:  Dr. Arlyss Repress  Pre-Procedure History & Physical: HPI:  Colin Griffin is a 72 y.o. male is here for an colonoscopy.   Past Medical History:  Diagnosis Date   Constipation 03/04/2023   Pre-diabetes     Past Surgical History:  Procedure Laterality Date   APPENDECTOMY     IR RADIOLOGIST EVAL & MGMT  03/16/2023   PELVIC ABCESS DRAINAGE     March 2024   TRANSURETHRAL RESECTION OF PROSTATE N/A 03/03/2023   Procedure: TRANSURETHRAL RESECTION OF THE PROSTATE (TURP)/ UNROOFING OF PROSTATE ABSCESS;  Surgeon: Riki Altes, MD;  Location: [facility] ORS;  Service: Urology;  Laterality: N/A;    Prior to Admission medications   Medication Sig Start Date End Date Taking? Authorizing Provider  solifenacin (VESICARE) 5 MG tablet Take 1 tablet (5 mg total) by mouth daily. 02/22/23  Yes Jacky Kindle, FNP  tamsulosin (FLOMAX) 0.4 MG CAPS capsule Take 1 capsule (0.4 mg total) by mouth 2 (two) times daily after a meal. 02/22/23  Yes Jacky Kindle, FNP  acetaminophen (TYLENOL) 325 MG tablet Take 2 tablets (650 mg total) by mouth every 6 (six) hours as needed for mild pain, fever or headache. 03/07/23   Pennie Banter, DO  Continuous Blood Gluc Sensor (FREESTYLE LIBRE 3 SENSOR) MISC 2 Devices by Does not apply route daily. Place 1 sensor on the skin every 14 days. Use to check glucose continuously 03/09/23   Simmons-Robinson, Tawanna Cooler, MD  Continuous Glucose Receiver (FREESTYLE LIBRE 3 READER) DEVI 2 Devices by Does not apply route daily. Use to monitor sensor glucose reading daily 03/13/23   Simmons-Robinson, Makiera, MD  Dulaglutide (TRULICITY) 0.75 MG/0.5ML SOPN Inject 0.75 mg into the skin once a week. 02/20/23   Simmons-Robinson, Tawanna Cooler, MD  HYDROcodone-acetaminophen  (NORCO/VICODIN) 5-325 MG tablet Take 1-2 tablets by mouth every 4 (four) hours as needed for moderate pain or severe pain. 03/07/23 03/06/24  Pennie Banter, DO  metFORMIN (GLUCOPHAGE-XR) 500 MG 24 hr tablet Take 1 tablet (500 mg total) by mouth daily with breakfast. 02/15/23   Simmons-Robinson, Makiera, MD  polyethylene glycol (MIRALAX / GLYCOLAX) 17 g packet Take 17 g by mouth daily. 03/08/23   Pennie Banter, DO    Allergies as of 03/27/2023   (No Known Allergies)    Family History  Problem Relation Age of Onset   Atrial fibrillation Mother    Cancer Maternal Grandmother    Diabetes Other     Social History   Socioeconomic History   Marital status: Married    Spouse name: Editor, commissioning   Number of children: 1   Years of education: Not on file   Highest education level: Master's degree (e.g., MA, MS, MEng, MEd, MSW, MBA)  Occupational History   Not on file  Tobacco Use   Smoking status: Every Day    Packs/day: 1.00    Years: 53.00    Additional pack years: 0.00    Total pack years: 53.00    Types: Cigarettes   Smokeless tobacco: Never   Tobacco comments:    Reports that at one time he smoked 3 packs per day while in the [employer]  Vaping Use   Vaping Use: Every day   Start date: 08/08/2022  Substance and Sexual Activity   Alcohol use:  Never   Drug use: Never   Sexual activity: Not on file  Other Topics Concern   Not on file  Social History Narrative   ** Merged History Encounter **       Retired from teaching radiology for 30 years    Social Determinants of Health   Financial Resource Strain: Low Risk  (03/15/2023)   Overall Financial Resource Strain (CARDIA)    Difficulty of Paying Living Expenses: Not hard at all  Food Insecurity: No Food Insecurity (03/15/2023)   Hunger Vital Sign    Worried About Running Out of Food in the Last Year: Never true    Ran Out of Food in the Last Year: Never true  Transportation Needs: No Transportation Needs (03/15/2023)    PRAPARE - Administrator, Civil Service (Medical): No    Lack of Transportation (Non-Medical): No  Physical Activity: Sufficiently Active (03/15/2023)   Exercise Vital Sign    Days of Exercise per Week: 7 days    Minutes of Exercise per Session: 150+ min  Stress: No Stress Concern Present (03/15/2023)   [employer] of Occupational Health - Occupational Stress Questionnaire    Feeling of Stress : Not at all  Social Connections: Socially Integrated (03/15/2023)   Social Connection and Isolation Panel [NHANES]    Frequency of Communication with Friends and Family: More than three times a week    Frequency of Social Gatherings with Friends and Family: Once a week    Attends Religious Services: More than 4 times per year    Active Member of [employer] or Organizations: Yes    Attends Engineer, structural: More than 4 times per year    Marital Status: Married  Catering manager Violence: Not At Risk (03/01/2023)   Humiliation, Afraid, Rape, and Kick questionnaire    Fear of Current or Ex-Partner: No    Emotionally Abused: No    Physically Abused: No    Sexually Abused: No    Review of Systems: See HPI, otherwise negative ROS  Physical Exam: BP 132/69   Pulse 60   Temp (!) 96.6 F (35.9 C) (Temporal)   Resp 16   Ht 6\' 1"  (1.854 m)   Wt 73.9 kg   SpO2 100%   BMI 21.51 kg/m  General:   Alert,  pleasant and cooperative in NAD Head:  Normocephalic and atraumatic. Neck:  Supple; no masses or thyromegaly. Lungs:  Clear throughout to auscultation.    Heart:  Regular rate and rhythm. Abdomen:  Soft, nontender and nondistended. Normal bowel sounds, without guarding, and without rebound.   Neurologic:  Alert and  oriented x4;  grossly normal neurologically.  Impression/Plan: Colin Griffin is here for an colonoscopy to be performed for colon cancer screening  Risks, benefits, limitations, and alternatives regarding  colonoscopy have been reviewed with the patient.   Questions have been answered.  All parties agreeable.   Lannette Donath, MD  04/12/2023, 10:14 AM

## 2023-04-13 ENCOUNTER — Encounter: Payer: Self-pay | Admitting: Gastroenterology

## 2023-04-13 ENCOUNTER — Ambulatory Visit: Payer: Self-pay | Admitting: Family Medicine

## 2023-04-13 LAB — SURGICAL PATHOLOGY

## 2023-04-14 ENCOUNTER — Other Ambulatory Visit: Payer: Self-pay

## 2023-04-14 ENCOUNTER — Telehealth: Payer: Self-pay

## 2023-04-14 DIAGNOSIS — Z86010 Personal history of colonic polyps: Secondary | ICD-10-CM

## 2023-04-14 MED ORDER — NA SULFATE-K SULFATE-MG SULF 17.5-3.13-1.6 GM/177ML PO SOLN: 1.0000 | Freq: Once | ORAL | 0 refills | Status: AC

## 2023-04-14 NOTE — Telephone Encounter (Signed)
Called patient to go over the results and he verbalized understanding of results. He states he does not want to go to [facility] because he does not want to drive that far he only wants to drive to [employer]. So placing referral to [facility]. Is this okay or do you want me to call him back and try to convince him to drive to [facility]

## 2023-04-14 NOTE — Telephone Encounter (Signed)
-----   Message from [name], MD sent at 04/14/2023  7:54 AM EDT ----- No worries, I understand  Morrie Sheldon  Refer him to [facility] and let pt know  RV ----- Message ----- From: Lemar Lofty., MD Sent: 04/14/2023   6:05 AM EDT To: [name], MD; Denman George, CMA  RV, Thanks for sending this message to me. Unfortunately at this point I am significantly booked through most of July, with the continued missing of Dr. Christella Hartigan being able to help with other advanced procedures.. As such I think based on the amount of work that this patient needs with multiple large polyps as well as the larger polyp that was removed in piecemeal, will make sense for this patient to be sent to another center in the interim. I do appreciate the referral and will continue to help our [facility] patients as much as I can in the future, so please keep me in mind in the future but will need a couple of months to try to get the current backlog of patients in. If for some reason this patient cannot go to another system then let me know. Thanks. GM ----- Message ----- From: [name], MD Sent: 04/13/2023   1:05 PM EDT To: Lemar Lofty., MD; #  Morrie Sheldon  Please inform patient that the pathology results from colonoscopy came back benign.  There are several large and flat polyps that need to be resected.  Discussed with patient about referral to Dr. [name] for resection of these large polyps.  Please place a referral and let him know  Thank you  [name]

## 2023-04-14 NOTE — Telephone Encounter (Signed)
Documented in other telephone call  

## 2023-04-14 NOTE — Telephone Encounter (Signed)
Colon EMR has been set up for 06/29/23 at 3 pm at [facility] with GM   Left message on machine to call back

## 2023-04-14 NOTE — Telephone Encounter (Signed)
-----   Message from Rohini Reddy Vanga, MD sent at 04/13/2023  1:05 PM EDT ----- Erika Slaby  Please inform patient that the pathology results from colonoscopy came back benign.  There are several large and flat polyps that need to be resected.  Discussed with patient about referral to Dr. Mansouraty for resection of these large polyps.  Please place a referral and let him know  Thank you  [name] 

## 2023-04-14 NOTE — Telephone Encounter (Signed)
-----   Message from [name], MD sent at 04/13/2023  1:05 PM EDT ----- Colin Griffin  Please inform patient that the pathology results from colonoscopy came back benign.  There are several large and flat polyps that need to be resected.  Discussed with patient about referral to Dr. [name] for resection of these large polyps.  Please place a referral and let him know  Thank you  [name]

## 2023-04-14 NOTE — Telephone Encounter (Signed)
06/22/23 at 11 am previsit has been scheduled.

## 2023-04-14 NOTE — Telephone Encounter (Signed)
-----   Message from Lemar Lofty., MD sent at 04/14/2023  6:02 AM EDT ----- RV, Thanks for sending this message to me. Unfortunately at this point I am significantly booked through most of July, with the continued missing of Dr. Christella Hartigan being able to help with other advanced procedures.. As such I think based on the amount of work that this patient needs with multiple large polyps as well as the larger polyp that was removed in piecemeal, will make sense for this patient to be sent to another center in the interim. I do appreciate the referral and will continue to help our  patients as much as I can in the future, so please keep me in mind in the future but will need a couple of months to try to get the current backlog of patients in. If for some reason this patient cannot go to another system then let me know. Thanks. GM ----- Message ----- From: [name], MD Sent: 04/13/2023   1:05 PM EDT To: Lemar Lofty., MD; #  Morrie Sheldon  Please inform patient that the pathology results from colonoscopy came back benign.  There are several large and flat polyps that need to be resected.  Discussed with patient about referral to Dr. [name] for resection of these large polyps.  Please place a referral and let him know  Thank you  [name]

## 2023-04-14 NOTE — Telephone Encounter (Signed)
Colon EMR / previsit scheduled, pt instructed and medications reviewed.  Patient instructions mailed to home and My Chart.  Patient to call with any questions or concerns.

## 2023-04-27 ENCOUNTER — Telehealth: Payer: Self-pay | Admitting: Family Medicine

## 2023-04-27 ENCOUNTER — Other Ambulatory Visit: Payer: Self-pay | Admitting: Family Medicine

## 2023-04-27 NOTE — Telephone Encounter (Signed)
Patient needs new prescription sig to say take 2 tablets a day instead of 1, routing for review.

## 2023-04-27 NOTE — Telephone Encounter (Signed)
Medication Refill - Medication: Pt needs a new prescription for Metformin 500 mg twice a day.  He was taking once a day  Has the patient contacted their pharmacy? Yes.  He was out too early because he was taking bid as she told him to (Agent: If no, request that the patient contact the pharmacy for the refill. If patient does not wish to contact the pharmacy document the reason why and proceed with request.) (Agent: If yes, when and what did the pharmacy advise?)  Preferred Pharmacy (with phone number or street name): [facility] in [place] Has the patient been seen for an appointment in the last year OR does the patient have an upcoming appointment? Yes.    Agent: Please be advised that RX refills may take up to 3 business days. We ask that you follow-up with your pharmacy.

## 2023-04-27 NOTE — Telephone Encounter (Signed)
Pt is calling in because he needs a new prescription for his medication metFORMIN (GLUCOPHAGE-XR) 500 MG 24 hr tablet [[id number]] . Pt says Dr. Roxan Hockey told him to double his dose and the pharmacy won't fill it until the new prescription is sent. Pt says he called this morning and hasn't heard anything back. Pt wants to know can someone else send in the prescription because he is completely out of medication.

## 2023-04-28 ENCOUNTER — Telehealth: Payer: Self-pay

## 2023-04-28 ENCOUNTER — Other Ambulatory Visit: Payer: Self-pay

## 2023-04-28 MED ORDER — METFORMIN HCL ER 500 MG PO TB24: 500.0000 mg | ORAL_TABLET | Freq: Two times a day (BID) | ORAL | 1 refills | Status: DC

## 2023-04-28 NOTE — Telephone Encounter (Signed)
Patient states that during his visit on 03/16/23 Dr R said to increase the Metformin to BID.  Patient has been doing that and has run out. The pharmacy said they can't refill early because they don't have the order to increase the dose.  Note from 03/16/23 says to continue 500 mg daily.  His A1C in March was 11.1 when the Metformin was initiated.  Next appointment is 05/15/23.  Can we/should we send in new Rx with increased dose.  If we don't do something to change the prescription he will be out until Dr R is back to verify.  If you want send this back to me personally and I will follow up with patient Thanks  Pt is calling in because he needs a new prescription for his medication metFORMIN (GLUCOPHAGE-XR) 500 MG 24 hr tablet [[id number]] . Pt says Dr. Roxan Hockey told him to double his dose and the pharmacy won't fill it until the new prescription is sent. Pt says he called this morning and hasn't heard anything back. Pt wants to know can someone else send in the prescription because he is completely out of medication.

## 2023-04-28 NOTE — Telephone Encounter (Signed)
Yes go ahead and send new Rx for Metformin XR 500mg  BID 30 or 90 day supply per patient preference.

## 2023-04-29 DIAGNOSIS — C61 Malignant neoplasm of prostate: Secondary | ICD-10-CM | POA: Insufficient documentation

## 2023-05-02 ENCOUNTER — Ambulatory Visit (INDEPENDENT_AMBULATORY_CARE_PROVIDER_SITE_OTHER): Payer: Medicare PPO

## 2023-05-02 VITALS — Ht 73.0 in | Wt 170.0 lb

## 2023-05-02 DIAGNOSIS — Z0000 Encounter for general adult medical examination without abnormal findings: Secondary | ICD-10-CM | POA: Diagnosis not present

## 2023-05-02 NOTE — Patient Instructions (Signed)
Colin Griffin , Thank you for taking time to come for your Medicare Wellness Visit. I appreciate your ongoing commitment to your health goals. Please review the following plan we discussed and let me know if I can assist you in the future.   These are the goals we discussed:  Goals   None     This is a list of the screening recommended for you and due dates:  Health Maintenance  Topic Date Due   Pneumonia Vaccine (1 of 2 - PCV) Never done   Complete foot exam   Never done   Eye exam for diabetics  Never done   Yearly kidney health urinalysis for diabetes  Never done   Hepatitis C Screening  Never done   DTaP/Tdap/Td vaccine (1 - Tdap) Never done   Zoster (Shingles) Vaccine (1 of 2) Never done   COVID-19 Vaccine (3 - Pfizer risk series) 03/02/2020   Flu Shot  06/29/2023   Hemoglobin A1C  08/17/2023   Yearly kidney function blood test for diabetes  03/21/2024   Screening for Lung Cancer  03/29/2024   Colon Cancer Screening  04/11/2024   Medicare Annual Wellness Visit  05/01/2024   HPV Vaccine  Aged Out    Advanced directives: no  Conditions/risks identified: low falls risk  Next appointment: Follow up in one year for your annual wellness visit. 05/06/2024 @9 :45am telephone  Preventive Care 65 Years and Older, Male  Preventive care refers to lifestyle choices and visits with your health care provider that can promote health and wellness. What does preventive care include? A yearly physical exam. This is also called an annual well check. Dental exams once or twice a year. Routine eye exams. Ask your health care provider how often you should have your eyes checked. Personal lifestyle choices, including: Daily care of your teeth and gums. Regular physical activity. Eating a healthy diet. Avoiding tobacco and drug use. Limiting alcohol use. Practicing safe sex. Taking low doses of aspirin every day. Taking vitamin and mineral supplements as recommended by your health care  provider. What happens during an annual well check? The services and screenings done by your health care provider during your annual well check will depend on your age, overall health, lifestyle risk factors, and family history of disease. Counseling  Your health care provider may ask you questions about your: Alcohol use. Tobacco use. Drug use. Emotional well-being. Home and relationship well-being. Sexual activity. Eating habits. History of falls. Memory and ability to understand (cognition). Work and work Astronomer. Screening  You may have the following tests or measurements: Height, weight, and BMI. Blood pressure. Lipid and cholesterol levels. These may be checked every 5 years, or more frequently if you are over 48 years old. Skin check. Lung cancer screening. You may have this screening every year starting at age 36 if you have a 30-pack-year history of smoking and currently smoke or have quit within the past 15 years. Fecal occult blood test (FOBT) of the stool. You may have this test every year starting at age 59. Flexible sigmoidoscopy or colonoscopy. You may have a sigmoidoscopy every 5 years or a colonoscopy every 10 years starting at age 86. Prostate cancer screening. Recommendations will vary depending on your family history and other risks. Hepatitis C blood test. Hepatitis B blood test. Sexually transmitted disease (STD) testing. Diabetes screening. This is done by checking your blood sugar (glucose) after you have not eaten for a while (fasting). You may have this done every 1-3  years. Abdominal aortic aneurysm (AAA) screening. You may need this if you are a current or former smoker. Osteoporosis. You may be screened starting at age 35 if you are at high risk. Talk with your health care provider about your test results, treatment options, and if necessary, the need for more tests. Vaccines  Your health care provider may recommend certain vaccines, such  as: Influenza vaccine. This is recommended every year. Tetanus, diphtheria, and acellular pertussis (Tdap, Td) vaccine. You may need a Td booster every 10 years. Zoster vaccine. You may need this after age 22. Pneumococcal 13-valent conjugate (PCV13) vaccine. One dose is recommended after age 57. Pneumococcal polysaccharide (PPSV23) vaccine. One dose is recommended after age 64. Talk to your health care provider about which screenings and vaccines you need and how often you need them. This information is not intended to replace advice given to you by your health care provider. Make sure you discuss any questions you have with your health care provider. Document Released: 12/11/2015 Document Revised: 08/03/2016 Document Reviewed: 09/15/2015 Elsevier Interactive Patient Education  2017 [employer].  Fall Prevention in the Home Falls can cause injuries. They can happen to people of all ages. There are many things you can do to make your home safe and to help prevent falls. What can I do on the outside of my home? Regularly fix the edges of walkways and driveways and fix any cracks. Remove anything that might make you trip as you walk through a door, such as a raised step or threshold. Trim any bushes or trees on the path to your home. Use bright outdoor lighting. Clear any walking paths of anything that might make someone trip, such as rocks or tools. Regularly check to see if handrails are loose or broken. Make sure that both sides of any steps have handrails. Any raised decks and porches should have guardrails on the edges. Have any leaves, snow, or ice cleared regularly. Use sand or salt on walking paths during winter. Clean up any spills in your garage right away. This includes oil or grease spills. What can I do in the bathroom? Use night lights. Install grab bars by the toilet and in the tub and shower. Do not use towel bars as grab bars. Use non-skid mats or decals in the tub or  shower. If you need to sit down in the shower, use a plastic, non-slip stool. Keep the floor dry. Clean up any water that spills on the floor as soon as it happens. Remove soap buildup in the tub or shower regularly. Attach bath mats securely with double-sided non-slip rug tape. Do not have throw rugs and other things on the floor that can make you trip. What can I do in the bedroom? Use night lights. Make sure that you have a light by your bed that is easy to reach. Do not use any sheets or blankets that are too big for your bed. They should not hang down onto the floor. Have a firm chair that has side arms. You can use this for support while you get dressed. Do not have throw rugs and other things on the floor that can make you trip. What can I do in the kitchen? Clean up any spills right away. Avoid walking on wet floors. Keep items that you use a lot in easy-to-reach places. If you need to reach something above you, use a strong step stool that has a grab bar. Keep electrical cords out of the way. Do  not use floor polish or wax that makes floors slippery. If you must use wax, use non-skid floor wax. Do not have throw rugs and other things on the floor that can make you trip. What can I do with my stairs? Do not leave any items on the stairs. Make sure that there are handrails on both sides of the stairs and use them. Fix handrails that are broken or loose. Make sure that handrails are as long as the stairways. Check any carpeting to make sure that it is firmly attached to the stairs. Fix any carpet that is loose or worn. Avoid having throw rugs at the top or bottom of the stairs. If you do have throw rugs, attach them to the floor with carpet tape. Make sure that you have a light switch at the top of the stairs and the bottom of the stairs. If you do not have them, ask someone to add them for you. What else can I do to help prevent falls? Wear shoes that: Do not have high heels. Have  rubber bottoms. Are comfortable and fit you well. Are closed at the toe. Do not wear sandals. If you use a stepladder: Make sure that it is fully opened. Do not climb a closed stepladder. Make sure that both sides of the stepladder are locked into place. Ask someone to hold it for you, if possible. Clearly mark and make sure that you can see: Any grab bars or handrails. First and last steps. Where the edge of each step is. Use tools that help you move around (mobility aids) if they are needed. These include: Canes. Walkers. Scooters. Crutches. Turn on the lights when you go into a dark area. Replace any light bulbs as soon as they burn out. Set up your furniture so you have a clear path. Avoid moving your furniture around. If any of your floors are uneven, fix them. If there are any pets around you, be aware of where they are. Review your medicines with your doctor. Some medicines can make you feel dizzy. This can increase your chance of falling. Ask your doctor what other things that you can do to help prevent falls. This information is not intended to replace advice given to you by your health care provider. Make sure you discuss any questions you have with your health care provider. Document Released: 09/10/2009 Document Revised: 04/21/2016 Document Reviewed: 12/19/2014 Elsevier Interactive Patient Education  2017 [employer].

## 2023-05-02 NOTE — Progress Notes (Signed)
I connected with  Enid Skeens on 05/02/23 by a audio enabled telemedicine application and verified that I am speaking with the correct person using two identifiers.  Patient Location: Home  Provider Location: Office/Clinic  I discussed the limitations of evaluation and management by telemedicine. The patient expressed understanding and agreed to proceed.  Subjective:   Key Gene Mckensie is a 72 y.o. male who presents for Medicare Annual/Subsequent preventive examination.  Review of Systems    Cardiac Risk Factors include: advanced age (>57men, >97 women);diabetes mellitus;male gender;smoking/ tobacco exposure    Objective:    Today's Vitals   05/01/23 1741 05/02/23 0953  Weight:  170 lb (77.1 kg)  Height:  6\' 1"  (1.854 m)  PainSc: 0-No pain    Body mass index is 22.43 kg/m.     05/02/2023   10:04 AM 04/12/2023    9:23 AM 04/05/2023    9:50 AM 03/03/2023   12:20 PM 03/01/2023   11:15 PM 03/01/2023    6:58 PM  Advanced Directives  Does Patient Have a Medical Advance Directive? No No No No No No  Would patient like information on creating a medical advance directive?   No - Patient declined No - Patient declined No - Patient declined     Current Medications (verified) Outpatient Encounter Medications as of 05/02/2023  Medication Sig   Continuous Blood Gluc Sensor (FREESTYLE LIBRE 3 SENSOR) MISC 2 Devices by Does not apply route daily. Place 1 sensor on the skin every 14 days. Use to check glucose continuously   Continuous Glucose Receiver (FREESTYLE LIBRE 3 READER) DEVI 2 Devices by Does not apply route daily. Use to monitor sensor glucose reading daily   Dulaglutide (TRULICITY) 0.75 MG/0.5ML SOPN Inject 0.75 mg into the skin once a week.   metFORMIN (GLUCOPHAGE-XR) 500 MG 24 hr tablet Take 1 tablet (500 mg total) by mouth 2 (two) times daily with a meal.   polyethylene glycol (MIRALAX / GLYCOLAX) 17 g packet Take 17 g by mouth daily.   solifenacin (VESICARE) 5 MG tablet Take 1  tablet (5 mg total) by mouth daily.   tamsulosin (FLOMAX) 0.4 MG CAPS capsule Take 1 capsule (0.4 mg total) by mouth 2 (two) times daily after a meal.   [DISCONTINUED] acetaminophen (TYLENOL) 325 MG tablet Take 2 tablets (650 mg total) by mouth every 6 (six) hours as needed for mild pain, fever or headache.   [DISCONTINUED] HYDROcodone-acetaminophen (NORCO/VICODIN) 5-325 MG tablet Take 1-2 tablets by mouth every 4 (four) hours as needed for moderate pain or severe pain.   No facility-administered encounter medications on file as of 05/02/2023.    Allergies (verified) Patient has no known allergies.   History: Past Medical History:  Diagnosis Date   Cancer [facility]) April 2024   Constipation 03/04/2023   Diabetes mellitus without complication [facility]) March 2024   Pre-diabetes    Past Surgical History:  Procedure Laterality Date   APPENDECTOMY     COLONOSCOPY WITH PROPOFOL N/A 04/12/2023   Procedure: COLONOSCOPY WITH PROPOFOL;  Surgeon: [name], MD;  Location: [facility] ENDOSCOPY;  Service: Gastroenterology;  Laterality: N/A;   IR RADIOLOGIST EVAL & MGMT  03/16/2023   PELVIC ABCESS DRAINAGE     March 2024   TRANSURETHRAL RESECTION OF PROSTATE N/A 03/03/2023   Procedure: TRANSURETHRAL RESECTION OF THE PROSTATE (TURP)/ UNROOFING OF PROSTATE ABSCESS;  Surgeon: Riki Altes, MD;  Location: [facility] ORS;  Service: Urology;  Laterality: N/A;   Family History  Problem Relation Age of  Onset   Atrial fibrillation Mother    Cancer Maternal Grandmother    Diabetes Other    Alcohol abuse Maternal Grandfather    Diabetes Brother    Social History   Socioeconomic History   Marital status: Married    Spouse name: Editor, commissioning   Number of children: 1   Years of education: Not on file   Highest education level: Master's degree (e.g., MA, MS, MEng, MEd, MSW, MBA)  Occupational History   Not on file  Tobacco Use   Smoking status: Every Day    Packs/day: 1.00    Years: 54.00     Additional pack years: 0.00    Total pack years: 54.00    Types: Cigarettes   Smokeless tobacco: Never   Tobacco comments:    Reports that at one time he smoked 3 packs per day while in the [employer]  Vaping Use   Vaping Use: Every day   Start date: 08/08/2022  Substance and Sexual Activity   Alcohol use: Never   Drug use: Never   Sexual activity: Not Currently    Birth control/protection: None  Other Topics Concern   Not on file  Social History Narrative   ** Merged History Encounter **       Retired from Agricultural consultant radiology for 30 years    Social Determinants of Corporate investment banker Strain: Low Risk  (05/01/2023)   Overall Financial Resource Strain (CARDIA)    Difficulty of Paying Living Expenses: Not hard at all  Food Insecurity: No Food Insecurity (05/01/2023)   Hunger Vital Sign    Worried About Running Out of Food in the Last Year: Never true    Ran Out of Food in the Last Year: Never true  Transportation Needs: No Transportation Needs (05/01/2023)   PRAPARE - Administrator, Civil Service (Medical): No    Lack of Transportation (Non-Medical): No  Physical Activity: Sufficiently Active (05/01/2023)   Exercise Vital Sign    Days of Exercise per Week: 6 days    Minutes of Exercise per Session: 150+ min  Stress: No Stress Concern Present (05/01/2023)   [employer] of Occupational Health - Occupational Stress Questionnaire    Feeling of Stress : Not at all  Social Connections: Moderately Integrated (05/01/2023)   Social Connection and Isolation Panel [NHANES]    Frequency of Communication with Friends and Family: Three times a week    Frequency of Social Gatherings with Friends and Family: Twice a week    Attends Religious Services: More than 4 times per year    Active Member of [employer] or Organizations: No    Attends Engineer, structural: Patient declined    Marital Status: Married    Tobacco Counseling Ready to quit: No Counseling given: Not  Answered Tobacco comments: Reports that at one time he smoked 3 packs per day while in the [employer]   Clinical Intake:  Pre-visit preparation completed: Yes  Pain : No/denies pain Pain Score: 0-No pain   BMI - recorded: 22.43 Nutritional Status: BMI of 19-24  Normal Nutritional Risks: Nausea/ vomitting/ diarrhea Diabetes: Yes CBG done?: Yes (134 this am at home) CBG resulted in Enter/ Edit results?: No Did pt. bring in CBG monitor from home?: No  How often do you need to have someone help you when you read instructions, pamphlets, or other written materials from your doctor or pharmacy?: 1 - Never  Diabetic?yes  Interpreter Needed?: No  Comments: lives with wife  Information entered by :: B.Ulyana Pitones,LPN   Activities of Daily Living    05/01/2023    5:41 PM 03/16/2023    2:50 PM  In your present state of health, do you have any difficulty performing the following activities:  Hearing? 0 0  Vision? 0 0  Difficulty concentrating or making decisions? 0 0  Walking or climbing stairs? 0 0  Dressing or bathing? 0 0  Doing errands, shopping? 0 0  Preparing Food and eating ? N   Using the Toilet? N   In the past six months, have you accidently leaked urine? Y   Do you have problems with loss of bowel control? N   Managing your Medications? N   Managing your Finances? N   Housekeeping or managing your Housekeeping? N     Patient Care Team: Ronnald Ramp, MD as PCP - General (Family Medicine)  Indicate any recent Medical Services you may have received from other than [facility] providers in the past year (date may be approximate).     Assessment:   This is a routine wellness examination for [place].  Hearing/Vision screen Hearing Screening - Comments:: Adequate hearing Vision Screening - Comments:: Adequate vision;readers for small print No eye exams  Dietary issues and exercise activities discussed: Current Exercise Habits: The patient has a physically strenuous  job, but has no regular exercise apart from work., Exercise limited by: None identified   Goals Addressed   None    Depression Screen    05/02/2023   10:00 AM 03/16/2023    2:49 PM 02/14/2023    2:20 PM  PHQ 2/9 Scores  PHQ - 2 Score 0 0 0  PHQ- 9 Score  0 0    Fall Risk    05/01/2023    5:41 PM 03/16/2023    2:49 PM 02/14/2023    2:20 PM  Fall Risk   Falls in the past year? 1 0 0  Number falls in past yr: 1 0 0  Injury with Fall? 0 0 0  Risk for fall due to : No Fall Risks No Fall Risks No Fall Risks  Follow up Falls prevention discussed;Education provided      FALL RISK PREVENTION PERTAINING TO THE HOME:  Any stairs in or around the home? No  If so, are there any without handrails? No  Home free of loose throw rugs in walkways, pet beds, electrical cords, etc? Yes  Adequate lighting in your home to reduce risk of falls? Yes   ASSISTIVE DEVICES UTILIZED TO PREVENT FALLS:  Life alert? No  Use of a cane, walker or w/c? No  Grab bars in the bathroom? Yes  Shower chair or bench in shower? No  Elevated toilet seat or a handicapped toilet? No    Cognitive Function:        05/02/2023   10:08 AM  6CIT Screen  What Year? 0 points  What month? 0 points  What time? 0 points  Count back from 20 0 points  Months in reverse 0 points  Repeat phrase 0 points  Total Score 0 points    Immunizations Immunization History  Administered Date(s) Administered   PFIZER(Purple Top)SARS-COV-2 Vaccination 01/13/2020, 02/03/2020    TDAP status: Up to date  Flu Vaccine status: Declined, Education has been provided regarding the importance of this vaccine but patient still declined. Advised may receive this vaccine at local pharmacy or Health Dept. Aware to provide a copy of the vaccination record if obtained from local pharmacy or  Health Dept. Verbalized acceptance and understanding.  Pneumococcal vaccine status: Declined,  Education has been provided regarding the importance of this  vaccine but patient still declined. Advised may receive this vaccine at local pharmacy or Health Dept. Aware to provide a copy of the vaccination record if obtained from local pharmacy or Health Dept. Verbalized acceptance and understanding.   Covid-19 vaccine status: Completed vaccines  Qualifies for Shingles Vaccine? Yes   Zostavax completed No   Shingrix Completed?: No.    Education has been provided regarding the importance of this vaccine. Patient has been advised to call insurance company to determine out of pocket expense if they have not yet received this vaccine. Advised may also receive vaccine at local pharmacy or Health Dept. Verbalized acceptance and understanding.  Screening Tests Health Maintenance  Topic Date Due   Pneumonia Vaccine 30+ Years old (1 of 2 - PCV) Never done   FOOT EXAM  Never done   OPHTHALMOLOGY EXAM  Never done   Diabetic kidney evaluation - Urine ACR  Never done   Hepatitis C Screening  Never done   DTaP/Tdap/Td (1 - Tdap) Never done   Zoster Vaccines- Shingrix (1 of 2) Never done   COVID-19 Vaccine (3 - Pfizer risk series) 03/02/2020   INFLUENZA VACCINE  06/29/2023   HEMOGLOBIN A1C  08/17/2023   Diabetic kidney evaluation - eGFR measurement  03/21/2024   Lung Cancer Screening  03/29/2024   Colonoscopy  04/11/2024   Medicare Annual Wellness (AWV)  05/01/2024   HPV VACCINES  Aged Out    Health Maintenance  Health Maintenance Due  Topic Date Due   Pneumonia Vaccine 26+ Years old (1 of 2 - PCV) Never done   FOOT EXAM  Never done   OPHTHALMOLOGY EXAM  Never done   Diabetic kidney evaluation - Urine ACR  Never done   Hepatitis C Screening  Never done   DTaP/Tdap/Td (1 - Tdap) Never done   Zoster Vaccines- Shingrix (1 of 2) Never done   COVID-19 Vaccine (3 - Pfizer risk series) 03/02/2020    Colorectal cancer screening: Type of screening: Colonoscopy. Completed yes. Repeat every 5 years  Lung Cancer Screening: (Low Dose CT Chest recommended if  Age 20-80 years, 30 pack-year currently smoking OR have quit w/in 15years.) does qualify.   Lung Cancer Screening Referral: no has had..chest CT in  Additional Screening:  Hepatitis C Screening: does not qualify; Completed yes  Vision Screening: Recommended annual ophthalmology exams for early detection of glaucoma and other disorders of the eye. Is the patient up to date with their annual eye exam?  No  Who is the provider or what is the name of the office in which the patient attends annual eye exams? none If pt is not established with a provider, would they like to be referred to a provider to establish care? No . Declines eye exam  Dental Screening: Recommended annual dental exams for proper oral hygiene  Community Resource Referral / Chronic Care Management: CRR required this visit?  No   CCM required this visit?  No    Plan:     I have personally reviewed and noted the following in the patient's chart:   Medical and social history Use of alcohol, tobacco or illicit drugs  Current medications and supplements including opioid prescriptions. Patient is not currently taking opioid prescriptions. Functional ability and status Nutritional status Physical activity Advanced directives List of other physicians Hospitalizations, surgeries, and ER visits in previous 12 months  Vitals Screenings to include cognitive, depression, and falls Referrals and appointments  In addition, I have reviewed and discussed with patient certain preventive protocols, quality metrics, and best practice recommendations. A written personalized care plan for preventive services as well as general preventive health recommendations were provided to patient.     Sue Lush, LPN   12/03/1094   Nurse Notes: Pt states he doing good. He is to begin radiation for prostate cancer in a week or two. 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

## 2023-05-04 ENCOUNTER — Encounter: Payer: Self-pay | Admitting: Urology

## 2023-05-04 ENCOUNTER — Ambulatory Visit: Payer: Medicare PPO | Admitting: Urology

## 2023-05-04 VITALS — BP 131/72 | HR 80 | Ht 73.0 in | Wt 170.0 lb

## 2023-05-04 DIAGNOSIS — Z2989 Encounter for other specified prophylactic measures: Secondary | ICD-10-CM | POA: Diagnosis not present

## 2023-05-04 DIAGNOSIS — C61 Malignant neoplasm of prostate: Secondary | ICD-10-CM

## 2023-05-04 MED ORDER — LEVOFLOXACIN 500 MG PO TABS
500.0000 mg | ORAL_TABLET | Freq: Once | ORAL | Status: AC
Start: 2023-05-04 — End: 2023-05-04
  Administered 2023-05-04: 500 mg via ORAL

## 2023-05-04 MED ORDER — LEUPROLIDE ACETATE (6 MONTH) 45 MG ~~LOC~~ KIT
45.0000 mg | PACK | Freq: Once | SUBCUTANEOUS | Status: AC
Start: 2023-05-04 — End: 2023-05-04
  Administered 2023-05-04: 45 mg via SUBCUTANEOUS

## 2023-05-04 MED ORDER — GENTAMICIN SULFATE 40 MG/ML IJ SOLN
80.0000 mg | Freq: Once | INTRAMUSCULAR | Status: AC
Start: 2023-05-04 — End: 2023-05-04
  Administered 2023-05-04: 80 mg via INTRAMUSCULAR

## 2023-05-04 NOTE — Progress Notes (Signed)
   05/04/23   HPI: 72 y.o. male with prostate cancer who presents today for placement of fiducial markers in anticipation of his upcoming IMRT with Dr. Rushie Chestnut.  Prostate Gold Seed Marker Placement Procedure   Informed consent was obtained after discussing risks/benefits of the procedure.  A time out was performed to ensure correct patient identity.  Pre-Procedure: - Gentamicin given prophylactically - PO Levaquin 500 mg also given today  Procedure: -Rectal ultrasound probe was placed without difficulty and the prostate visualized -Prostatic block performed with 10 mL 1% Xylocaine -3 gold fiducial markers placed, one at right base, one at left base, one at apex of prostate gland under transrectal ultrasound guidance  Post-Procedure: - Patient tolerated the procedure well - He was counseled to seek immediate medical attention if experiences any severe pain, significant bleeding, or fevers

## 2023-05-04 NOTE — Progress Notes (Signed)
Eligard SubQ Injection   Due to Prostate Cancer patient is present today for a Eligard Injection.  Medication: Eligard 6 month Dose: 45 mg  Location: right  Lot: [id number] Exp: 07/2024  Patient tolerated well, no complications were noted  Performed by: Ples Specter CMA   Per Dr. Lonna Cobb patient is to continue therapy for 6 months . This appointment was scheduled using wheel and given to patient today along with reminder continue on Vitamin D 800-1000iu and Calcium 1000-1200mg  daily while on Androgen Deprivation Therapy.  PA approval dates:

## 2023-05-05 ENCOUNTER — Telehealth: Payer: Self-pay | Admitting: Acute Care

## 2023-05-05 DIAGNOSIS — Z122 Encounter for screening for malignant neoplasm of respiratory organs: Secondary | ICD-10-CM

## 2023-05-05 DIAGNOSIS — Z87891 Personal history of nicotine dependence: Secondary | ICD-10-CM

## 2023-05-05 DIAGNOSIS — F17210 Nicotine dependence, cigarettes, uncomplicated: Secondary | ICD-10-CM

## 2023-05-05 NOTE — Telephone Encounter (Signed)
I have attempted to call the patient with the results of their  Low Dose CT Chest Lung cancer screening scan. There was no answer. I have left a HIPPA compliant VM requesting the patient call the office for the scan results. I included the office contact information in the message. We will await his return call. If no return call we will continue to call until patient is contacted.    Ladies, Scan is a LR 3. There is a 7.2  mm nodule noted , in addition to a 6.3 mm nodule both of which meet criteria for a LR 3. Pt. Needs a 6 month follow up.  Additionally he needs referral to Pulmonary for early stages of ILD. You can ask him if he would like to be referred here or to pulmonary in [place], [place] or [place]. Please fax results to PCP and let them know once we get in touch with the patient , plan is for a 3 month follow up and referral for early stage ILD. Thanks so much

## 2023-05-08 ENCOUNTER — Ambulatory Visit: Payer: Medicare PPO

## 2023-05-09 ENCOUNTER — Ambulatory Visit
Admission: RE | Admit: 2023-05-09 | Discharge: 2023-05-09 | Disposition: A | Discharge status: Home / Self Care | Payer: Medicare PPO | Source: Ambulatory Visit | Attending: Radiation Oncology | Admitting: Radiation Oncology

## 2023-05-09 DIAGNOSIS — C61 Malignant neoplasm of prostate: Secondary | ICD-10-CM | POA: Diagnosis present

## 2023-05-09 NOTE — Telephone Encounter (Signed)
Called and spoke to pt. Pt is aware of the results and Sarah's recommendations to have 6 month follow up scan. CT chest has been ordered for 09/2023. Results and plan have been sent to PCP. Patient has declined an ILD referral and states he will call back if he changes his mind. Nothing further needed at this time.

## 2023-05-12 ENCOUNTER — Other Ambulatory Visit: Payer: Self-pay | Admitting: *Deleted

## 2023-05-12 DIAGNOSIS — C61 Malignant neoplasm of prostate: Secondary | ICD-10-CM

## 2023-05-15 ENCOUNTER — Encounter: Payer: Self-pay | Admitting: Family Medicine

## 2023-05-15 ENCOUNTER — Ambulatory Visit: Payer: Medicare PPO | Admitting: Family Medicine

## 2023-05-15 VITALS — BP 113/75 | HR 94 | Wt 174.1 lb

## 2023-05-15 DIAGNOSIS — E1165 Type 2 diabetes mellitus with hyperglycemia: Secondary | ICD-10-CM | POA: Diagnosis not present

## 2023-05-15 DIAGNOSIS — S46812A Strain of other muscles, fascia and tendons at shoulder and upper arm level, left arm, initial encounter: Secondary | ICD-10-CM | POA: Insufficient documentation

## 2023-05-15 DIAGNOSIS — C61 Malignant neoplasm of prostate: Secondary | ICD-10-CM | POA: Diagnosis not present

## 2023-05-15 LAB — POCT GLYCOSYLATED HEMOGLOBIN (HGB A1C): Hemoglobin A1C: 7.4 % — AB (ref 4.0–5.6)

## 2023-05-15 MED ORDER — TIZANIDINE HCL 2 MG PO TABS: 4.0000 mg | ORAL_TABLET | Freq: Three times a day (TID) | ORAL | 0 refills | Status: DC

## 2023-05-15 NOTE — Patient Instructions (Addendum)
Your A1c has improved to 7.4 from 11   Please continue your metformin and dietary changes to help with managing your glucose   Our goal is less than 7, so you have done some great work   Please take two of the Tizanidine to help with your muscle soreness for the next 10 days. You can take two tabs three times daily.   Continue to use the heat therapy and massage your neck muscles to work on the tension

## 2023-05-15 NOTE — Assessment & Plan Note (Signed)
DM improved recent diagnosis with hemoglobin A1c elevated at 11  repeat Hgb A1c today Recommended starting Trulicity 0.75mg  weekly Continue metformin 500 twice daily  Continue dietary management and incorporating regular physical activity  DM Health Maintenance Needs DM eye exam, patient to schedule  Urine microalbumin collected today  Foot exam UTD, normal, completed today

## 2023-05-15 NOTE — Progress Notes (Signed)
I,Sha'taria Tyson,acting as a Neurosurgeon for [employer], MD.,have documented all relevant documentation on the behalf of Ronnald Ramp, MD,as directed by  Ronnald Ramp, MD while in the presence of Ronnald Ramp, MD.   Established patient visit   Patient: Colin Griffin   DOB: Sep 26, 1951   72 y.o. Male  MRN: [id number] Visit Date: 05/15/2023  Today's healthcare provider: Ronnald Ramp, MD   No chief complaint on file.  Subjective    HPI   Neck Pain  Patient reports pain in neck near left shoulder that has been lingering for a little over a week.  Originally thought it was a crook in his neck but is not going anywhere.  Reports its worse at night and reminds him of a spasm. States it starts at c4 and drops down to [employer] using some heat and states that it feels better in the mornings but hard to sleep at night  Reports that he recently unloaded hay bales from truck that weighed 40-50lbs each    Diabetes Mellitus Type II, Follow-up  Lab Results  Component Value Date   HGBA1C 7.4 (A) 05/15/2023   HGBA1C 11.1 (H) 02/14/2023   Wt Readings from Last 3 Encounters:  05/15/23 174 lb 1.6 oz (79 kg)  05/04/23 170 lb (77.1 kg)  05/02/23 170 lb (77.1 kg)   Last seen for diabetes 2 months ago.  Management since then includes continue current treatment. He reports excellent compliance with treatment. He is not having side effects.  Reports that BG averages around 120-130s, fasting levels can be in the 90s    Symptoms: No fatigue No foot ulcerations  No appetite changes No nausea  No paresthesia of the feet  No polydipsia  No polyuria No visual disturbances   No vomiting     Home blood sugar records: fasting range: 120-135  Episodes of hypoglycemia? No    Current insulin regiment: none  Most Recent Eye Exam: needs eye exam   Pertinent Labs: No results found for: "CHOL", "HDL", "LDLCALC", "LDLDIRECT", "TRIG",  "CHOLHDL" Lab Results  Component Value Date   NA 138 03/22/2023   K 4.1 03/22/2023   CREATININE 1.05 03/22/2023   EGFR 77 03/22/2023     ---------------------------------------------------------------------------------------------------   Medications: Outpatient Medications Prior to Visit  Medication Sig   Continuous Blood Gluc Sensor (FREESTYLE LIBRE 3 SENSOR) MISC 2 Devices by Does not apply route daily. Place 1 sensor on the skin every 14 days. Use to check glucose continuously   Continuous Glucose Receiver (FREESTYLE LIBRE 3 READER) DEVI 2 Devices by Does not apply route daily. Use to monitor sensor glucose reading daily   Dulaglutide (TRULICITY) 0.75 MG/0.5ML SOPN Inject 0.75 mg into the skin once a week.   metFORMIN (GLUCOPHAGE-XR) 500 MG 24 hr tablet Take 1 tablet (500 mg total) by mouth 2 (two) times daily with a meal.   No facility-administered medications prior to visit.    Review of Systems     Objective    BP 113/75 (BP Location: Right Arm, Patient Position: Sitting, Cuff Size: Normal)   Pulse 94   Wt 174 lb 1.6 oz (79 kg)   SpO2 99%   BMI 22.97 kg/m    Physical Exam Vitals reviewed.  Constitutional:      General: He is not in acute distress.    Appearance: Normal appearance. He is not ill-appearing, toxic-appearing or diaphoretic.  Eyes:     Conjunctiva/sclera: Conjunctivae normal.  Cardiovascular:     Rate  and Rhythm: Normal rate and regular rhythm.     Pulses: Normal pulses.     Heart sounds: Normal heart sounds. No murmur heard.    No friction rub. No gallop.  Pulmonary:     Effort: Pulmonary effort is normal. No respiratory distress.     Breath sounds: Normal breath sounds. No stridor. No wheezing, rhonchi or rales.  Abdominal:     General: Bowel sounds are normal. There is no distension.     Palpations: Abdomen is soft.     Tenderness: There is no abdominal tenderness.  Musculoskeletal:     Right lower leg: No edema.     Left lower leg: No  edema.  Skin:    Findings: No erythema or rash.  Neurological:     Mental Status: He is alert and oriented to person, place, and time.       Results for orders placed or performed in visit on 05/15/23  POCT HgB A1C  Result Value Ref Range   Hemoglobin A1C 7.4 (A) 4.0 - 5.6 %   HbA1c POC (<> result, manual entry)     HbA1c, POC (prediabetic range)     HbA1c, POC (controlled diabetic range)      Assessment & Plan     Problem List Items Addressed This Visit       Endocrine   Type 2 diabetes mellitus with hyperglycemia, without long-term current use of insulin (HCC) - Primary    DM improved recent diagnosis with hemoglobin A1c elevated at 11  repeat Hgb A1c today Recommended starting Trulicity 0.75mg  weekly Continue metformin 500 twice daily  Continue dietary management and incorporating regular physical activity  DM Health Maintenance Needs DM eye exam, patient to schedule  Urine microalbumin collected today  Foot exam UTD, normal, completed today        Relevant Orders   POCT HgB A1C (Completed)   Urine microalbumin-creatinine with uACR     Musculoskeletal and Integument   Trapezius strain, left, initial encounter    Acute Recommended patient continue with stretches and gentle massage to extremity muscle Patient does have palpable nodule on exam and evidence of muscle spasm Will prescribe 4 mg of tizanidine 4 times daily Recommended patient have formal massage to help with discomfort Follow-up for worsening symptoms        Return in about 3 months (around 08/15/2023) for DM f/u .        The entirety of the information documented in the History of Present Illness, Review of Systems and Physical Exam were personally obtained by me. Portions of this information were initially documented by Sha'taria Tyson,CMA. I, Ronnald Ramp, MD have reviewed the documentation above for thoroughness and accuracy.      Ronnald Ramp, MD  [facility] [phone] (phone) [phone] (fax)  [facility]

## 2023-05-15 NOTE — Assessment & Plan Note (Signed)
Acute Recommended patient continue with stretches and gentle massage to extremity muscle Patient does have palpable nodule on exam and evidence of muscle spasm Will prescribe 4 mg of tizanidine 4 times daily Recommended patient have formal massage to help with discomfort Follow-up for worsening symptoms

## 2023-05-16 LAB — MICROALBUMIN / CREATININE URINE RATIO
Creatinine, Urine: 195.7 mg/dL
Microalb/Creat Ratio: 14 mg/g creat (ref 0–29)
Microalbumin, Urine: 27 ug/mL

## 2023-05-18 ENCOUNTER — Ambulatory Visit: Admission: RE | Admit: 2023-05-18 | Payer: Medicare PPO | Source: Ambulatory Visit

## 2023-05-22 ENCOUNTER — Ambulatory Visit: Admission: RE | Admit: 2023-05-22 | Payer: Medicare PPO | Source: Ambulatory Visit

## 2023-05-22 ENCOUNTER — Ambulatory Visit
Admission: RE | Admit: 2023-05-22 | Discharge: 2023-05-22 | Disposition: A | Discharge status: Home / Self Care | Payer: Medicare PPO | Source: Ambulatory Visit | Attending: Radiation Oncology | Admitting: Radiation Oncology

## 2023-05-22 ENCOUNTER — Ambulatory Visit: Payer: Medicare PPO

## 2023-05-22 DIAGNOSIS — C61 Malignant neoplasm of prostate: Secondary | ICD-10-CM

## 2023-05-22 NOTE — Progress Notes (Signed)
Radiation Oncology Follow up Note  Name: Colin Griffin   Date:   05/22/2023 MRN:  [id number] DOB: 1950-12-09    This 72 y.o. male presents to the clinic today for 70-month follow-up status post radiation therapy to his prostate for Gleason 7 adenocarcinoma the prostate discovered at time of transurethral unroofing of the prostate secondary to abscess formation.  REFERRING PROVIDER: Brett Albino*  HPI: Patient is a 72 year old male now out for months having completed external beam radiation therapy to his prostate discovered at the time of transurethral unroofing of the prostate secondary to abscess formation.  Seen today in routine follow-up he is doing well specifically denies any increased lower urinary tract symptoms diarrhea or fatigue his most recent PSA is 0.5.  COMPLICATIONS OF TREATMENT: none  FOLLOW UP COMPLIANCE: keeps appointments   PHYSICAL EXAM:  There were no vitals taken for this visit. Well-developed well-nourished patient in NAD. HEENT reveals PERLA, EOMI, discs not visualized.  Oral cavity is clear. No oral mucosal lesions are identified. Neck is clear without evidence of cervical or supraclavicular adenopathy. Lungs are clear to A&P. Cardiac examination is essentially unremarkable with regular rate and rhythm without murmur rub or thrill. Abdomen is benign with no organomegaly or masses noted. Motor sensory and DTR levels are equal and symmetric in the upper and lower extremities. Cranial nerves II through XII are grossly intact. Proprioception is intact. No peripheral adenopathy or edema is identified. No motor or sensory levels are noted. Crude visual fields are within normal range.  RADIOLOGY RESULTS: No current films for review  PLAN: Present time patient is doing well with excellent biochemical control of his prostate cancer.  Of asked to see him back in 6 months with a follow-up PSA.  He continues follow-up care with urology.  Patient is to call with any  concerns.  I would like to take this opportunity to thank you for allowing me to participate in the care of your patient.Carmina Miller, MD

## 2023-05-23 ENCOUNTER — Ambulatory Visit: Payer: Medicare PPO

## 2023-05-23 DIAGNOSIS — C61 Malignant neoplasm of prostate: Secondary | ICD-10-CM | POA: Diagnosis not present

## 2023-05-24 ENCOUNTER — Ambulatory Visit
Admission: RE | Admit: 2023-05-24 | Discharge: 2023-05-24 | Disposition: A | Discharge status: Home / Self Care | Payer: Medicare PPO | Source: Ambulatory Visit | Attending: Radiation Oncology | Admitting: Radiation Oncology

## 2023-05-24 ENCOUNTER — Other Ambulatory Visit: Payer: Self-pay

## 2023-05-24 DIAGNOSIS — C61 Malignant neoplasm of prostate: Secondary | ICD-10-CM | POA: Diagnosis not present

## 2023-05-24 LAB — RAD ONC ARIA SESSION SUMMARY
Course Elapsed Days: 0
Plan Fractions Treated to Date: 1
Plan Prescribed Dose Per Fraction: 2 Gy
Plan Total Fractions Prescribed: 40
Plan Total Prescribed Dose: 80 Gy
Reference Point Dosage Given to Date: 2 Gy
Reference Point Session Dosage Given: 2 Gy
Session Number: 1

## 2023-05-25 ENCOUNTER — Ambulatory Visit
Admission: RE | Admit: 2023-05-25 | Discharge: 2023-05-25 | Disposition: A | Discharge status: Home / Self Care | Payer: Medicare PPO | Source: Ambulatory Visit | Attending: Radiation Oncology | Admitting: Radiation Oncology

## 2023-05-25 ENCOUNTER — Other Ambulatory Visit: Payer: Self-pay

## 2023-05-25 ENCOUNTER — Inpatient Hospital Stay: Payer: Medicare PPO

## 2023-05-25 DIAGNOSIS — C61 Malignant neoplasm of prostate: Secondary | ICD-10-CM | POA: Diagnosis not present

## 2023-05-25 LAB — RAD ONC ARIA SESSION SUMMARY
Course Elapsed Days: 1
Plan Fractions Treated to Date: 2
Plan Prescribed Dose Per Fraction: 2 Gy
Plan Total Fractions Prescribed: 40
Plan Total Prescribed Dose: 80 Gy
Reference Point Dosage Given to Date: 4 Gy
Reference Point Session Dosage Given: 2 Gy
Session Number: 2

## 2023-05-26 ENCOUNTER — Ambulatory Visit
Admission: RE | Admit: 2023-05-26 | Discharge: 2023-05-26 | Disposition: A | Discharge status: Home / Self Care | Payer: Medicare PPO | Source: Ambulatory Visit | Attending: Radiation Oncology | Admitting: Radiation Oncology

## 2023-05-26 ENCOUNTER — Other Ambulatory Visit: Payer: Self-pay

## 2023-05-26 DIAGNOSIS — C61 Malignant neoplasm of prostate: Secondary | ICD-10-CM | POA: Diagnosis not present

## 2023-05-26 LAB — RAD ONC ARIA SESSION SUMMARY
Course Elapsed Days: 2
Plan Fractions Treated to Date: 3
Plan Prescribed Dose Per Fraction: 2 Gy
Plan Total Fractions Prescribed: 40
Plan Total Prescribed Dose: 80 Gy
Reference Point Dosage Given to Date: 6 Gy
Reference Point Session Dosage Given: 2 Gy
Session Number: 3

## 2023-05-28 ENCOUNTER — Other Ambulatory Visit: Payer: Self-pay | Admitting: Family Medicine

## 2023-05-29 ENCOUNTER — Ambulatory Visit
Admission: RE | Admit: 2023-05-29 | Discharge: 2023-05-29 | Disposition: A | Discharge status: Home / Self Care | Payer: Medicare PPO | Source: Ambulatory Visit | Attending: Radiation Oncology | Admitting: Radiation Oncology

## 2023-05-29 ENCOUNTER — Ambulatory Visit: Payer: Medicare PPO

## 2023-05-29 ENCOUNTER — Other Ambulatory Visit: Payer: Self-pay

## 2023-05-29 DIAGNOSIS — C61 Malignant neoplasm of prostate: Secondary | ICD-10-CM | POA: Insufficient documentation

## 2023-05-29 LAB — RAD ONC ARIA SESSION SUMMARY
Course Elapsed Days: 5
Plan Fractions Treated to Date: 4
Plan Prescribed Dose Per Fraction: 2 Gy
Plan Total Fractions Prescribed: 40
Plan Total Prescribed Dose: 80 Gy
Reference Point Dosage Given to Date: 8 Gy
Reference Point Session Dosage Given: 2 Gy
Session Number: 4

## 2023-05-30 ENCOUNTER — Ambulatory Visit
Admission: RE | Admit: 2023-05-30 | Discharge: 2023-05-30 | Disposition: A | Discharge status: Home / Self Care | Payer: Medicare PPO | Source: Ambulatory Visit | Attending: Radiation Oncology | Admitting: Radiation Oncology

## 2023-05-30 ENCOUNTER — Ambulatory Visit: Payer: Medicare PPO

## 2023-05-30 ENCOUNTER — Other Ambulatory Visit: Payer: Self-pay

## 2023-05-30 DIAGNOSIS — C61 Malignant neoplasm of prostate: Secondary | ICD-10-CM | POA: Diagnosis not present

## 2023-05-30 LAB — RAD ONC ARIA SESSION SUMMARY
Course Elapsed Days: 6
Plan Fractions Treated to Date: 5
Plan Prescribed Dose Per Fraction: 2 Gy
Plan Total Fractions Prescribed: 40
Plan Total Prescribed Dose: 80 Gy
Reference Point Dosage Given to Date: 10 Gy
Reference Point Session Dosage Given: 2 Gy
Session Number: 5

## 2023-05-31 ENCOUNTER — Ambulatory Visit
Admission: RE | Admit: 2023-05-31 | Discharge: 2023-05-31 | Disposition: A | Discharge status: Home / Self Care | Payer: Medicare PPO | Source: Ambulatory Visit | Attending: Radiation Oncology | Admitting: Radiation Oncology

## 2023-05-31 ENCOUNTER — Other Ambulatory Visit: Payer: Self-pay

## 2023-05-31 DIAGNOSIS — C61 Malignant neoplasm of prostate: Secondary | ICD-10-CM | POA: Diagnosis not present

## 2023-05-31 LAB — RAD ONC ARIA SESSION SUMMARY
Course Elapsed Days: 7
Plan Fractions Treated to Date: 6
Plan Prescribed Dose Per Fraction: 2 Gy
Plan Total Fractions Prescribed: 40
Plan Total Prescribed Dose: 80 Gy
Reference Point Dosage Given to Date: 12 Gy
Reference Point Session Dosage Given: 2 Gy
Session Number: 6

## 2023-06-02 ENCOUNTER — Other Ambulatory Visit: Payer: Self-pay

## 2023-06-02 ENCOUNTER — Ambulatory Visit: Admission: RE | Admit: 2023-06-02 | Payer: Medicare PPO | Source: Ambulatory Visit

## 2023-06-02 DIAGNOSIS — C61 Malignant neoplasm of prostate: Secondary | ICD-10-CM | POA: Diagnosis not present

## 2023-06-02 LAB — RAD ONC ARIA SESSION SUMMARY
Course Elapsed Days: 9
Plan Fractions Treated to Date: 7
Plan Prescribed Dose Per Fraction: 2 Gy
Plan Total Fractions Prescribed: 40
Plan Total Prescribed Dose: 80 Gy
Reference Point Dosage Given to Date: 14 Gy
Reference Point Session Dosage Given: 2 Gy
Session Number: 7

## 2023-06-05 ENCOUNTER — Ambulatory Visit
Admission: RE | Admit: 2023-06-05 | Discharge: 2023-06-05 | Disposition: A | Discharge status: Home / Self Care | Payer: Medicare PPO | Source: Ambulatory Visit | Attending: Radiation Oncology | Admitting: Radiation Oncology

## 2023-06-05 ENCOUNTER — Other Ambulatory Visit: Payer: Self-pay

## 2023-06-05 DIAGNOSIS — C61 Malignant neoplasm of prostate: Secondary | ICD-10-CM | POA: Diagnosis not present

## 2023-06-05 LAB — RAD ONC ARIA SESSION SUMMARY
Course Elapsed Days: 12
Plan Fractions Treated to Date: 8
Plan Prescribed Dose Per Fraction: 2 Gy
Plan Total Fractions Prescribed: 40
Plan Total Prescribed Dose: 80 Gy
Reference Point Dosage Given to Date: 16 Gy
Reference Point Session Dosage Given: 2 Gy
Session Number: 8

## 2023-06-06 ENCOUNTER — Telehealth: Payer: Self-pay | Admitting: Gastroenterology

## 2023-06-06 ENCOUNTER — Other Ambulatory Visit: Payer: Self-pay

## 2023-06-06 ENCOUNTER — Ambulatory Visit
Admission: RE | Admit: 2023-06-06 | Discharge: 2023-06-06 | Disposition: A | Discharge status: Home / Self Care | Payer: Medicare PPO | Source: Ambulatory Visit | Attending: Radiation Oncology | Admitting: Radiation Oncology

## 2023-06-06 DIAGNOSIS — C61 Malignant neoplasm of prostate: Secondary | ICD-10-CM | POA: Diagnosis not present

## 2023-06-06 LAB — RAD ONC ARIA SESSION SUMMARY
Course Elapsed Days: 13
Plan Fractions Treated to Date: 9
Plan Prescribed Dose Per Fraction: 2 Gy
Plan Total Fractions Prescribed: 40
Plan Total Prescribed Dose: 80 Gy
Reference Point Dosage Given to Date: 18 Gy
Reference Point Session Dosage Given: 2 Gy
Session Number: 9

## 2023-06-06 NOTE — Telephone Encounter (Signed)
Inbound call from patient wishing to know if he is able to have his pre visit appointment over the phone instead of in person on 7/25 at 11 am. Please advise, thank you.

## 2023-06-06 NOTE — Telephone Encounter (Signed)
Spoke with pt and pre-visit appt changed to telephone previsit. Pt is aware.

## 2023-06-07 ENCOUNTER — Ambulatory Visit
Admission: RE | Admit: 2023-06-07 | Discharge: 2023-06-07 | Disposition: A | Discharge status: Home / Self Care | Payer: Medicare PPO | Source: Ambulatory Visit | Attending: Radiation Oncology | Admitting: Radiation Oncology

## 2023-06-07 ENCOUNTER — Other Ambulatory Visit: Payer: Self-pay

## 2023-06-07 DIAGNOSIS — C61 Malignant neoplasm of prostate: Secondary | ICD-10-CM | POA: Diagnosis not present

## 2023-06-07 LAB — RAD ONC ARIA SESSION SUMMARY
Course Elapsed Days: 14
Plan Fractions Treated to Date: 10
Plan Prescribed Dose Per Fraction: 2 Gy
Plan Total Fractions Prescribed: 40
Plan Total Prescribed Dose: 80 Gy
Reference Point Dosage Given to Date: 20 Gy
Reference Point Session Dosage Given: 2 Gy
Session Number: 10

## 2023-06-08 ENCOUNTER — Inpatient Hospital Stay: Payer: Medicare PPO

## 2023-06-08 ENCOUNTER — Ambulatory Visit
Admission: RE | Admit: 2023-06-08 | Discharge: 2023-06-08 | Disposition: A | Discharge status: Home / Self Care | Payer: Medicare PPO | Source: Ambulatory Visit | Attending: Radiation Oncology | Admitting: Radiation Oncology

## 2023-06-08 ENCOUNTER — Other Ambulatory Visit: Payer: Self-pay

## 2023-06-08 DIAGNOSIS — Z923 Personal history of irradiation: Secondary | ICD-10-CM | POA: Insufficient documentation

## 2023-06-08 DIAGNOSIS — C61 Malignant neoplasm of prostate: Secondary | ICD-10-CM | POA: Insufficient documentation

## 2023-06-08 LAB — CBC (CANCER CENTER ONLY)
HCT: 33.3 % — ABNORMAL LOW (ref 39.0–52.0)
Hemoglobin: 11.3 g/dL — ABNORMAL LOW (ref 13.0–17.0)
MCH: 31.2 pg (ref 26.0–34.0)
MCHC: 33.9 g/dL (ref 30.0–36.0)
MCV: 92 fL (ref 80.0–100.0)
Platelet Count: 159 10*3/uL (ref 150–400)
RBC: 3.62 MIL/uL — ABNORMAL LOW (ref 4.22–5.81)
RDW: 14 % (ref 11.5–15.5)
WBC Count: 7.2 10*3/uL (ref 4.0–10.5)
nRBC: 0 % (ref 0.0–0.2)

## 2023-06-08 LAB — RAD ONC ARIA SESSION SUMMARY
Course Elapsed Days: 15
Plan Fractions Treated to Date: 11
Plan Prescribed Dose Per Fraction: 2 Gy
Plan Total Fractions Prescribed: 40
Plan Total Prescribed Dose: 80 Gy
Reference Point Dosage Given to Date: 22 Gy
Reference Point Session Dosage Given: 2 Gy
Session Number: 11

## 2023-06-08 LAB — PSA: Prostatic Specific Antigen: 0.42 ng/mL (ref 0.00–4.00)

## 2023-06-09 ENCOUNTER — Ambulatory Visit: Admission: RE | Admit: 2023-06-09 | Payer: Medicare PPO | Source: Ambulatory Visit

## 2023-06-09 ENCOUNTER — Other Ambulatory Visit: Payer: Self-pay

## 2023-06-09 DIAGNOSIS — C61 Malignant neoplasm of prostate: Secondary | ICD-10-CM | POA: Diagnosis not present

## 2023-06-09 LAB — RAD ONC ARIA SESSION SUMMARY
Course Elapsed Days: 16
Plan Fractions Treated to Date: 12
Plan Prescribed Dose Per Fraction: 2 Gy
Plan Total Fractions Prescribed: 40
Plan Total Prescribed Dose: 80 Gy
Reference Point Dosage Given to Date: 24 Gy
Reference Point Session Dosage Given: 2 Gy
Session Number: 12

## 2023-06-12 ENCOUNTER — Ambulatory Visit: Admission: RE | Admit: 2023-06-12 | Payer: Medicare PPO | Source: Ambulatory Visit

## 2023-06-12 ENCOUNTER — Other Ambulatory Visit: Payer: Self-pay

## 2023-06-12 DIAGNOSIS — C61 Malignant neoplasm of prostate: Secondary | ICD-10-CM | POA: Diagnosis not present

## 2023-06-12 LAB — RAD ONC ARIA SESSION SUMMARY
Course Elapsed Days: 19
Plan Fractions Treated to Date: 13
Plan Prescribed Dose Per Fraction: 2 Gy
Plan Total Fractions Prescribed: 40
Plan Total Prescribed Dose: 80 Gy
Reference Point Dosage Given to Date: 26 Gy
Reference Point Session Dosage Given: 2 Gy
Session Number: 13

## 2023-06-13 ENCOUNTER — Other Ambulatory Visit: Payer: Self-pay

## 2023-06-13 ENCOUNTER — Ambulatory Visit: Admission: RE | Admit: 2023-06-13 | Payer: Medicare PPO | Source: Ambulatory Visit

## 2023-06-13 DIAGNOSIS — C61 Malignant neoplasm of prostate: Secondary | ICD-10-CM | POA: Diagnosis not present

## 2023-06-13 LAB — RAD ONC ARIA SESSION SUMMARY
Course Elapsed Days: 20
Plan Fractions Treated to Date: 14
Plan Prescribed Dose Per Fraction: 2 Gy
Plan Total Fractions Prescribed: 40
Plan Total Prescribed Dose: 80 Gy
Reference Point Dosage Given to Date: 28 Gy
Reference Point Session Dosage Given: 2 Gy
Session Number: 14

## 2023-06-14 ENCOUNTER — Other Ambulatory Visit: Payer: Self-pay

## 2023-06-14 ENCOUNTER — Ambulatory Visit
Admission: RE | Admit: 2023-06-14 | Discharge: 2023-06-14 | Disposition: A | Discharge status: Home / Self Care | Payer: Medicare PPO | Source: Ambulatory Visit | Attending: Radiation Oncology | Admitting: Radiation Oncology

## 2023-06-14 DIAGNOSIS — C61 Malignant neoplasm of prostate: Secondary | ICD-10-CM | POA: Diagnosis not present

## 2023-06-14 LAB — RAD ONC ARIA SESSION SUMMARY
Course Elapsed Days: 21
Plan Fractions Treated to Date: 15
Plan Prescribed Dose Per Fraction: 2 Gy
Plan Total Fractions Prescribed: 40
Plan Total Prescribed Dose: 80 Gy
Reference Point Dosage Given to Date: 30 Gy
Reference Point Session Dosage Given: 2 Gy
Session Number: 15

## 2023-06-15 ENCOUNTER — Ambulatory Visit
Admission: RE | Admit: 2023-06-15 | Discharge: 2023-06-15 | Disposition: A | Discharge status: Home / Self Care | Payer: Medicare PPO | Source: Ambulatory Visit | Attending: Radiation Oncology | Admitting: Radiation Oncology

## 2023-06-15 ENCOUNTER — Other Ambulatory Visit: Payer: Self-pay

## 2023-06-15 DIAGNOSIS — C61 Malignant neoplasm of prostate: Secondary | ICD-10-CM | POA: Diagnosis not present

## 2023-06-15 LAB — RAD ONC ARIA SESSION SUMMARY
Course Elapsed Days: 22
Plan Fractions Treated to Date: 16
Plan Prescribed Dose Per Fraction: 2 Gy
Plan Total Fractions Prescribed: 40
Plan Total Prescribed Dose: 80 Gy
Reference Point Dosage Given to Date: 32 Gy
Reference Point Session Dosage Given: 2 Gy
Session Number: 16

## 2023-06-16 ENCOUNTER — Ambulatory Visit: Payer: Medicare PPO

## 2023-06-19 ENCOUNTER — Ambulatory Visit
Admission: RE | Admit: 2023-06-19 | Discharge: 2023-06-19 | Disposition: A | Discharge status: Home / Self Care | Payer: Medicare PPO | Source: Ambulatory Visit | Attending: Radiation Oncology | Admitting: Radiation Oncology

## 2023-06-19 ENCOUNTER — Other Ambulatory Visit: Payer: Self-pay

## 2023-06-19 DIAGNOSIS — C61 Malignant neoplasm of prostate: Secondary | ICD-10-CM | POA: Diagnosis not present

## 2023-06-19 LAB — RAD ONC ARIA SESSION SUMMARY
Course Elapsed Days: 26
Plan Fractions Treated to Date: 17
Plan Prescribed Dose Per Fraction: 2 Gy
Plan Total Fractions Prescribed: 40
Plan Total Prescribed Dose: 80 Gy
Reference Point Dosage Given to Date: 34 Gy
Reference Point Session Dosage Given: 2 Gy
Session Number: 17

## 2023-06-20 ENCOUNTER — Other Ambulatory Visit: Payer: Self-pay

## 2023-06-20 ENCOUNTER — Ambulatory Visit
Admission: RE | Admit: 2023-06-20 | Discharge: 2023-06-20 | Disposition: A | Discharge status: Home / Self Care | Payer: Medicare PPO | Source: Ambulatory Visit | Attending: Radiation Oncology | Admitting: Radiation Oncology

## 2023-06-20 DIAGNOSIS — C61 Malignant neoplasm of prostate: Secondary | ICD-10-CM | POA: Diagnosis not present

## 2023-06-20 LAB — RAD ONC ARIA SESSION SUMMARY
Course Elapsed Days: 27
Plan Fractions Treated to Date: 18
Plan Prescribed Dose Per Fraction: 2 Gy
Plan Total Fractions Prescribed: 40
Plan Total Prescribed Dose: 80 Gy
Reference Point Dosage Given to Date: 36 Gy
Reference Point Session Dosage Given: 2 Gy
Session Number: 18

## 2023-06-21 ENCOUNTER — Other Ambulatory Visit: Payer: Self-pay

## 2023-06-21 ENCOUNTER — Encounter (HOSPITAL_COMMUNITY): Payer: Self-pay | Admitting: Gastroenterology

## 2023-06-21 ENCOUNTER — Ambulatory Visit: Admission: RE | Admit: 2023-06-21 | Payer: Medicare PPO | Source: Ambulatory Visit

## 2023-06-21 DIAGNOSIS — C61 Malignant neoplasm of prostate: Secondary | ICD-10-CM | POA: Diagnosis not present

## 2023-06-21 LAB — RAD ONC ARIA SESSION SUMMARY
Course Elapsed Days: 28
Plan Fractions Treated to Date: 19
Plan Prescribed Dose Per Fraction: 2 Gy
Plan Total Fractions Prescribed: 40
Plan Total Prescribed Dose: 80 Gy
Reference Point Dosage Given to Date: 38 Gy
Reference Point Session Dosage Given: 2 Gy
Session Number: 19

## 2023-06-22 ENCOUNTER — Encounter: Payer: Self-pay | Admitting: Gastroenterology

## 2023-06-22 ENCOUNTER — Telehealth: Payer: Self-pay

## 2023-06-22 ENCOUNTER — Other Ambulatory Visit: Payer: Self-pay

## 2023-06-22 ENCOUNTER — Ambulatory Visit (AMBULATORY_SURGERY_CENTER): Payer: Medicare PPO

## 2023-06-22 ENCOUNTER — Ambulatory Visit
Admission: RE | Admit: 2023-06-22 | Discharge: 2023-06-22 | Disposition: A | Discharge status: Home / Self Care | Payer: Medicare PPO | Source: Ambulatory Visit | Attending: Radiation Oncology | Admitting: Radiation Oncology

## 2023-06-22 ENCOUNTER — Inpatient Hospital Stay: Payer: Medicare PPO

## 2023-06-22 VITALS — Ht 73.0 in | Wt 170.0 lb

## 2023-06-22 DIAGNOSIS — C61 Malignant neoplasm of prostate: Secondary | ICD-10-CM | POA: Diagnosis not present

## 2023-06-22 DIAGNOSIS — Z86010 Personal history of colonic polyps: Secondary | ICD-10-CM

## 2023-06-22 LAB — RAD ONC ARIA SESSION SUMMARY
Course Elapsed Days: 29
Plan Fractions Treated to Date: 20
Plan Prescribed Dose Per Fraction: 2 Gy
Plan Total Fractions Prescribed: 40
Plan Total Prescribed Dose: 80 Gy
Reference Point Dosage Given to Date: 40 Gy
Reference Point Session Dosage Given: 2 Gy
Session Number: 20

## 2023-06-22 LAB — CBC (CANCER CENTER ONLY)
HCT: 36.6 % — ABNORMAL LOW (ref 39.0–52.0)
Hemoglobin: 12.3 g/dL — ABNORMAL LOW (ref 13.0–17.0)
MCH: 31.5 pg (ref 26.0–34.0)
MCHC: 33.6 g/dL (ref 30.0–36.0)
MCV: 93.6 fL (ref 80.0–100.0)
Platelet Count: 169 10*3/uL (ref 150–400)
RBC: 3.91 MIL/uL — ABNORMAL LOW (ref 4.22–5.81)
RDW: 13.6 % (ref 11.5–15.5)
WBC Count: 6.4 10*3/uL (ref 4.0–10.5)
nRBC: 0 % (ref 0.0–0.2)

## 2023-06-22 NOTE — Progress Notes (Signed)
No egg or soy allergy known to patient  No issues known to pt with past sedation with any surgeries or procedures Patient denies ever being told they had issues or difficulty with intubation  No FH of Malignant Hyperthermia Pt is not on diet pills Pt is not on  home 02  Pt is not on blood thinners  Pt denies issues with constipation  No A fib or A flutter Have any cardiac testing pending--no  LOA: independent  Prep: suprep   Patient's chart reviewed by Cathlyn Parsons CNRA prior to previsit and patient appropriate for the [facility].  Previsit completed and red dot placed by patient's name on their procedure day (on provider's schedule).     PV competed with patient. Prep instructions sent via mychart. Pt already has prep instructions and medication.

## 2023-06-22 NOTE — Telephone Encounter (Signed)
PV in progress  

## 2023-06-23 ENCOUNTER — Ambulatory Visit: Admission: RE | Admit: 2023-06-23 | Payer: Medicare PPO | Source: Ambulatory Visit

## 2023-06-23 ENCOUNTER — Other Ambulatory Visit: Payer: Self-pay

## 2023-06-23 DIAGNOSIS — C61 Malignant neoplasm of prostate: Secondary | ICD-10-CM | POA: Diagnosis not present

## 2023-06-23 LAB — RAD ONC ARIA SESSION SUMMARY
Course Elapsed Days: 30
Plan Fractions Treated to Date: 21
Plan Prescribed Dose Per Fraction: 2 Gy
Plan Total Fractions Prescribed: 40
Plan Total Prescribed Dose: 80 Gy
Reference Point Dosage Given to Date: 42 Gy
Reference Point Session Dosage Given: 2 Gy
Session Number: 21

## 2023-06-26 ENCOUNTER — Other Ambulatory Visit: Payer: Self-pay

## 2023-06-26 ENCOUNTER — Ambulatory Visit: Payer: Medicare PPO

## 2023-06-26 ENCOUNTER — Ambulatory Visit
Admission: RE | Admit: 2023-06-26 | Discharge: 2023-06-26 | Disposition: A | Discharge status: Home / Self Care | Payer: Medicare PPO | Source: Ambulatory Visit | Attending: Radiation Oncology | Admitting: Radiation Oncology

## 2023-06-26 DIAGNOSIS — C61 Malignant neoplasm of prostate: Secondary | ICD-10-CM | POA: Diagnosis not present

## 2023-06-26 LAB — RAD ONC ARIA SESSION SUMMARY
Course Elapsed Days: 33
Plan Fractions Treated to Date: 22
Plan Prescribed Dose Per Fraction: 2 Gy
Plan Total Fractions Prescribed: 40
Plan Total Prescribed Dose: 80 Gy
Reference Point Dosage Given to Date: 44 Gy
Reference Point Session Dosage Given: 2 Gy
Session Number: 22

## 2023-06-27 ENCOUNTER — Ambulatory Visit
Admission: RE | Admit: 2023-06-27 | Discharge: 2023-06-27 | Disposition: A | Discharge status: Home / Self Care | Payer: Medicare PPO | Source: Ambulatory Visit | Attending: Radiation Oncology | Admitting: Radiation Oncology

## 2023-06-27 ENCOUNTER — Other Ambulatory Visit: Payer: Self-pay

## 2023-06-27 ENCOUNTER — Ambulatory Visit: Payer: Medicare PPO

## 2023-06-27 ENCOUNTER — Telehealth: Payer: Self-pay

## 2023-06-27 DIAGNOSIS — C61 Malignant neoplasm of prostate: Secondary | ICD-10-CM | POA: Diagnosis not present

## 2023-06-27 LAB — RAD ONC ARIA SESSION SUMMARY
Course Elapsed Days: 34
Plan Fractions Treated to Date: 23
Plan Prescribed Dose Per Fraction: 2 Gy
Plan Total Fractions Prescribed: 40
Plan Total Prescribed Dose: 80 Gy
Reference Point Dosage Given to Date: 46 Gy
Reference Point Session Dosage Given: 2 Gy
Session Number: 23

## 2023-06-27 NOTE — Telephone Encounter (Signed)
-----   Message from [facility] sent at 06/27/2023  6:04 AM EDT ----- Regarding: Upcoming Colonoscopy Krista Som, I have this patient on my schedule for Thursday. He has multiple large polyps. I would like to try and talk with him/family to set expectations for Thursday. Can be a Televisit at [phone] if possible, so that they are aware I may only be able to go after 1 of the large polyps at this colonoscopy and he may need multiple for [place] to get things controlled. Thanks for trying.  If we can't get this set up, he can still come for procedure, but please set their expectations. GM

## 2023-06-27 NOTE — Telephone Encounter (Signed)
The pt has been scheduled for 7/31 telephone visit at 4:10 pm with GM per Dr [name]. The pt has been advised.

## 2023-06-28 ENCOUNTER — Ambulatory Visit: Payer: Medicare PPO | Admitting: Dietician

## 2023-06-28 ENCOUNTER — Telehealth (INDEPENDENT_AMBULATORY_CARE_PROVIDER_SITE_OTHER): Payer: Medicare PPO | Admitting: Gastroenterology

## 2023-06-28 ENCOUNTER — Ambulatory Visit
Admission: RE | Admit: 2023-06-28 | Discharge: 2023-06-28 | Disposition: A | Discharge status: Home / Self Care | Payer: Medicare PPO | Source: Ambulatory Visit | Attending: Radiation Oncology | Admitting: Radiation Oncology

## 2023-06-28 ENCOUNTER — Other Ambulatory Visit: Payer: Self-pay

## 2023-06-28 DIAGNOSIS — Z86010 Personal history of colonic polyps: Secondary | ICD-10-CM

## 2023-06-28 DIAGNOSIS — R933 Abnormal findings on diagnostic imaging of other parts of digestive tract: Secondary | ICD-10-CM

## 2023-06-28 DIAGNOSIS — Z800 Family history of malignant neoplasm of digestive organs: Secondary | ICD-10-CM

## 2023-06-28 DIAGNOSIS — D125 Benign neoplasm of sigmoid colon: Secondary | ICD-10-CM

## 2023-06-28 DIAGNOSIS — D122 Benign neoplasm of ascending colon: Secondary | ICD-10-CM | POA: Diagnosis not present

## 2023-06-28 DIAGNOSIS — C61 Malignant neoplasm of prostate: Secondary | ICD-10-CM | POA: Diagnosis not present

## 2023-06-28 LAB — RAD ONC ARIA SESSION SUMMARY
Course Elapsed Days: 35
Plan Fractions Treated to Date: 24
Plan Prescribed Dose Per Fraction: 2 Gy
Plan Total Fractions Prescribed: 40
Plan Total Prescribed Dose: 80 Gy
Reference Point Dosage Given to Date: 48 Gy
Reference Point Session Dosage Given: 2 Gy
Session Number: 24

## 2023-06-28 NOTE — Anesthesia Preprocedure Evaluation (Signed)
Anesthesia Evaluation  Patient identified by MRN, date of birth, ID band Patient awake    Reviewed: Allergy & Precautions, NPO status , Patient's Chart, lab work & pertinent test results  Airway Mallampati: II  TM Distance: >3 FB Neck ROM: Full    Dental  (+) Teeth Intact, Dental Advisory Given   Pulmonary Current Smoker and Patient abstained from smoking.   Pulmonary exam normal breath sounds clear to auscultation       Cardiovascular negative cardio ROS Normal cardiovascular exam Rhythm:Regular Rate:Normal     Neuro/Psych negative neurological ROS     GI/Hepatic negative GI ROS, Neg liver ROS,,,  Endo/Other  diabetes, Type 2, Oral Hypoglycemic Agents    Renal/GU negative Renal ROS     Musculoskeletal negative musculoskeletal ROS (+)    Abdominal   Peds  Hematology  (+) Blood dyscrasia, anemia   Anesthesia Other Findings   Reproductive/Obstetrics                             Anesthesia Physical Anesthesia Plan  ASA: 2  Anesthesia Plan: MAC   Post-op Pain Management:    Induction: Intravenous  PONV Risk Score and Plan: 0 and TIVA and Treatment may vary due to age or medical condition  Airway Management Planned: Natural Airway and Simple Face Mask  Additional Equipment:   Intra-op Plan:   Post-operative Plan:   Informed Consent: I have reviewed the patients History and Physical, chart, labs and discussed the procedure including the risks, benefits and alternatives for the proposed anesthesia with the patient or authorized representative who has indicated his/her understanding and acceptance.     Dental advisory given  Plan Discussed with: CRNA and Anesthesiologist  Anesthesia Plan Comments:        Anesthesia Quick Evaluation

## 2023-06-28 NOTE — Progress Notes (Signed)
GASTROENTEROLOGY OUTPATIENT CLINIC VISIT   Primary Care Provider Ronnald Ramp, MD [address] [place] [address] [phone]  Referring Provider Dr. Allegra Lai  Patient Profile: Colin Griffin is a 72 y.o. male with a pmh significant for diabetes, prostate cancer, cholelithiasis, colon polyps (TA's with 2 large polyps left in situ), family history of rectal cancer (sister).  The patient presents to the [facility] for an evaluation and management of problem(s) noted below:  Problem List 1. Hx of adenomatous colonic polyps   2. Adenomatous polyp of ascending colon   3. Adenomatous polyp of sigmoid colon   4. Abnormal colonoscopy   5. Family history of rectal cancer    I connected with  Enid Skeens on 06/28/23. I verified that I was speaking with the correct person using two identifiers. This service was provided via telemedicine using audiovisual media. The patient was located in his vehicle. The provider was located in the office. The patient did consent to this visit and is aware of charges through their insurance as well as the limitations of evaluation and management by telemedicine. The patient was referred by Dr. Allegra Lai Other persons participating in this telemedicine service were none. Time spent on visit was total of 45 minutes in regards to review of patient's chart and endoscopies and imaging and history and discussion with the patient and completion of notation in the chart.   History of Present Illness The patient recently underwent a screening colonoscopy in May of this year.  He was found to have multiple tubular adenomas including 1 large ascending colon polyp that was resected via EMR by Dr. Allegra Lai and 2 other large sessile lesions in the ascending and sigmoid colon.  It is for these 2 other lesions that the patient is referred for an attempt at advanced endoscopic resection.  Patient is currently undergoing  therapy for his prostate cancer with radiation treatments multiple times per week.  He has not had any significant GI symptoms.  Not had any rectal bleeding or changes in bowel habits.  He does state that prior to Dr. Verdis Prime colonoscopy he had a colonoscopy years prior and was told that it was normal.  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  The patient does not take significant nonsteroidals or BC/Goody powders.  GI Review of Systems Positive as above Negative for dysphagia, odynophagia, nausea, vomiting, pain, melena, hematochezia  Review of Systems General: Denies fevers/chills/weight loss unintentionally Cardiovascular: Denies chest pain Pulmonary: Denies shortness of breath Gastroenterological: See HPI Genitourinary: Denies darkened urine  Hematological: Denies easy bruising/bleeding Dermatological: Denies jaundice Psychological: Mood is stable   Medications Current Outpatient Medications  Medication Sig Dispense Refill   Continuous Blood Gluc Sensor (FREESTYLE LIBRE 3 SENSOR) MISC 2 Devices by Does not apply route daily. Place 1 sensor on the skin every 14 days. Use to check glucose continuously 2 each 6   Continuous Glucose Receiver (FREESTYLE LIBRE 3 READER) DEVI 2 Devices by Does not apply route daily. Use to monitor sensor glucose reading daily 2 each 6   Dulaglutide (TRULICITY) 0.75 MG/0.5ML SOPN Inject 0.75 mg into the skin once a week. 2 mL 3   metFORMIN (GLUCOPHAGE-XR) 500 MG 24 hr tablet Take 1 tablet (500 mg total) by mouth 2 (two) times daily with a meal. 180 tablet 1   tiZANidine (ZANAFLEX) 2 MG tablet Take 2 tablets (4 mg total) by mouth 3 (three) times daily. (Patient  not taking: Reported on 06/22/2023) 120 tablet 0   No current facility-administered medications for this visit.    Allergies No Known Allergies  Histories Past Medical History:  Diagnosis Date   Cancer [facility]) April 2024   Constipation 03/04/2023    Diabetes mellitus without complication [facility]) March 2024   Pre-diabetes    Past Surgical History:  Procedure Laterality Date   APPENDECTOMY     COLONOSCOPY WITH PROPOFOL N/A 04/12/2023   Procedure: COLONOSCOPY WITH PROPOFOL;  Surgeon: [name], MD;  Location: [facility] ENDOSCOPY;  Service: Gastroenterology;  Laterality: N/A;   IR RADIOLOGIST EVAL & MGMT  03/16/2023   PELVIC ABCESS DRAINAGE     March 2024   TRANSURETHRAL RESECTION OF PROSTATE N/A 03/03/2023   Procedure: TRANSURETHRAL RESECTION OF THE PROSTATE (TURP)/ UNROOFING OF PROSTATE ABSCESS;  Surgeon: Riki Altes, MD;  Location: [facility] ORS;  Service: Urology;  Laterality: N/A;   Social History   Socioeconomic History   Marital status: Married    Spouse name: Editor, commissioning   Number of children: 1   Years of education: Not on file   Highest education level: Master's degree (e.g., MA, MS, MEng, MEd, MSW, MBA)  Occupational History   Not on file  Tobacco Use   Smoking status: Every Day    Current packs/day: 1.00    Average packs/day: 1 pack/day for 54.0 years (54.0 ttl pk-yrs)    Types: Cigarettes   Smokeless tobacco: Never   Tobacco comments:    Reports that at one time he smoked 3 packs per day while in the [employer]  Vaping Use   Vaping status: Every Day   Start date: 08/08/2022   Substances: Nicotine  Substance and Sexual Activity   Alcohol use: Never   Drug use: Never   Sexual activity: Not Currently    Birth control/protection: None  Other Topics Concern   Not on file  Social History Narrative   ** Merged History Encounter **       Retired from Agricultural consultant radiology for 30 years    Social Determinants of Corporate investment banker Strain: Low Risk  (05/01/2023)   Overall Financial Resource Strain (CARDIA)    Difficulty of Paying Living Expenses: Not hard at all  Food Insecurity: No Food Insecurity (05/01/2023)   Hunger Vital Sign    Worried About Running Out of Food in the Last Year: Never true    Ran  Out of Food in the Last Year: Never true  Transportation Needs: No Transportation Needs (05/01/2023)   PRAPARE - Administrator, Civil Service (Medical): No    Lack of Transportation (Non-Medical): No  Physical Activity: Sufficiently Active (05/01/2023)   Exercise Vital Sign    Days of Exercise per Week: 6 days    Minutes of Exercise per Session: 150+ min  Stress: No Stress Concern Present (05/01/2023)   [employer] of Occupational Health - Occupational Stress Questionnaire    Feeling of Stress : Not at all  Social Connections: Moderately Integrated (05/01/2023)   Social Connection and Isolation Panel [NHANES]    Frequency of Communication with Friends and Family: Three times a week    Frequency of Social Gatherings with Friends and Family: Twice a week    Attends Religious Services: More than 4 times per year    Active Member of [employer] or Organizations: No    Attends Banker Meetings: Patient declined    Marital Status: Married  Catering manager Violence: Not At Risk (  05/02/2023)   Humiliation, Afraid, Rape, and Kick questionnaire    Fear of Current or Ex-Partner: No    Emotionally Abused: No    Physically Abused: No    Sexually Abused: No   Family History  Problem Relation Age of Onset   Atrial fibrillation Mother    Rectal cancer Sister    Diabetes Brother    Cancer Maternal Grandmother    Alcohol abuse Maternal Grandfather    Diabetes Other    Colon cancer Neg Hx    Colon polyps Neg Hx    Stomach cancer Neg Hx    Esophageal cancer Neg Hx    Inflammatory bowel disease Neg Hx    Liver disease Neg Hx    Pancreatic cancer Neg Hx    I have reviewed his medical, social, and family history in detail and updated the electronic medical record as necessary.    PHYSICAL EXAMINATION  Telehealth visit   REVIEW OF DATA  I reviewed the following data at the time of this encounter:  GI Procedures and Studies  May 2024 colonoscopy - Three 5 to 7 mm polyps  in the cecum, removed with a cold snare. Resected and retrieved. - One greater than 50 mm polyp in the proximal ascending colon, removed with mucosal resection. Resected and retrieved. Clip manufacturer: [employer]. Clips (MR safe) were placed. - One greater than 50 mm polyp in the distal ascending colon. Resection not attempted. Tattooed. - One greater than 50 mm polyp in the sigmoid colon in the descending colon. Resection not attempted. - Two 9 to 10 mm polyps in the descending colon, removed with a hot snare. Resected and retrieved. - One 4 mm polyp in the descending colon, removed with a cold snare. Resected and retrieved. - One 15 mm polyp in the sigmoid colon, removed with a hot snare. Resected and retrieved. - The distal rectum and anal verge are normal on retroflexion view. - Mucosal resection was performed. Resection and retrieval were complete.  Pathology DIAGNOSIS: A. COLON POLYPS X 3, CECUM; COLD SNARE: - MULTIPLE FRAGMENTS OF TUBULAR ADENOMAS. - NEGATIVE FOR HIGH-GRADE DYSPLASIA AND MALIGNANCY. B. COLON POLYP, ASCENDING; HOT SNARE: - MULTIPLE FRAGMENTS OF TUBULAR ADENOMA. - CAUTERIZED POLYP BASE CANNOT BE RELIABLY ASSESSED SECONDARY TO SPECIMEN FRAGMENTATION. - NEGATIVE FOR HIGH-GRADE DYSPLASIA AND MALIGNANCY. C. COLON POLYPS X 3, DESCENDING; HOT (X 2) AND COLD SNARES: - MULTIPLE FRAGMENTS OF TUBULAR ADENOMAS. - CAUTERIZED POLYP BASES CANNOT BE RELIABLY ASSESSED SECONDARY TO SPECIMEN FRAGMENTATION. - NEGATIVE FOR HIGH-GRADE DYSPLASIA AND MALIGNANCY. D. COLON POLYP, SIGMOID; HOT SNARE: - TUBULAR ADENOMA. - CAUTERIZED POLYP BASE APPEARS FREE OF DYSPLASIA. - NEGATIVE FOR HIGH-GRADE DYSPLASIA AND MALIGNANCY.  Laboratory Studies  Reviewed those in epic  Imaging Studies  April 2024 CT abdomen pelvis with contrast IMPRESSION: 1. Left ischial rectal fossa drain in appropriate position. Interval resolution of previously seen left ischiorectal fossa abscess. 2. Unchanged 2.5  x 1.5 cm abscess within the right side of the prostate. Diffusely dilated prosthetic urethra. 3. Diffuse bladder wall thickening likely due to under distension. Chronic cystitis and outlet obstruction can have a similar appearance. 4. Cholelithiasis.   ASSESSMENT  Mr. Buchler is a 72 y.o. male with a pmh significant for diabetes, prostate cancer, cholelithiasis, colon polyps (TA's with 2 large polyps left in situ), family history of rectal cancer (sister).  The patient is seen today for evaluation and management of:  1. Hx of adenomatous colonic polyps   2. Adenomatous polyp of ascending colon  3. Adenomatous polyp of sigmoid colon   4. Abnormal colonoscopy   5. Family history of rectal cancer    The patient is hemodynamically and clinically stable from a GI perspective.  Based upon the description and endoscopic pictures I do feel that it is reasonable to pursue an Advanced Polypectomy attempt of the polyp/lesion.  We discussed some of the techniques of advanced polypectomy which include Endoscopic Mucosal Resection, OVESCO Full-Thickness Resection, Endorotor Morcellation, and Tissue Ablation via Fulguration.  We also reviewed images of typical techniques as noted above.  The risks and benefits of endoscopic evaluation were discussed with the patient; these include but are not limited to the risk of perforation, infection, bleeding, missed lesions, lack of diagnosis, severe illness requiring hospitalization, as well as anesthesia and sedation related illnesses.  During attempts at advanced resection, the risks of bleeding and perforation/leak are increased as opposed to diagnostic and screening procedures, and that was discussed with the patient as well.   In addition, I explained that with the possible need for piecemeal resection, subsequent short-interval endoscopic evaluation for follow up and potential retreatment of the lesion/area may be necessary.  I did offer, a referral to surgery in  order for patient to have opportunity to discuss surgical management/intervention prior to finalizing decision for attempt at endoscopic removal, however, the patient deferred on this.  If, after attempt at removal of the polyp/lesion, it is found that the patient has a complication or that an invasive lesion or malignant lesion is found, or that the polyp/lesion continues to recur, the patient is aware and understands that surgery may still be indicated/required.  I did discuss with the patient in detail that depending on the complexity of these polyps, that may not have the ability to remove both of them at once but we will see how we do with our currently scheduled procedure later this week.  He could require a second procedure in the short interval if we cannot remove both of them at the same time.  We will evaluate the rest of the colon as well for any additional polyps that may require resection as well.  All patient questions were answered, to the best of my ability, and the patient agrees to the aforementioned plan of action with follow-up as indicated.   PLAN  Proceed with our scheduled colonoscopy later this week for attempt at advanced resection of the ascending colon and sigmoid colon polyps Follow-up to be dictated based on results of colonoscopy   No orders of the defined types were placed in this encounter.   New Prescriptions   No medications on file   Modified Medications   No medications on file    Planned Follow Up No follow-ups on file.   Total Time in Face-to-Face and in Coordination of Care for patient including independent/personal interpretation/review of prior testing, medical history, examination, medication adjustment, communicating results with the patient directly, and documentation within the EHR is 45 minutes.   Corliss Parish, MD [facility] Advanced [facility]oscopy Office # [phone]

## 2023-06-29 ENCOUNTER — Ambulatory Visit (HOSPITAL_COMMUNITY): Payer: Medicare PPO | Admitting: Anesthesiology

## 2023-06-29 ENCOUNTER — Encounter (HOSPITAL_COMMUNITY): Payer: Self-pay | Admitting: Gastroenterology

## 2023-06-29 ENCOUNTER — Other Ambulatory Visit: Payer: Self-pay

## 2023-06-29 ENCOUNTER — Ambulatory Visit
Admission: RE | Admit: 2023-06-29 | Discharge: 2023-06-29 | Disposition: A | Discharge status: Home / Self Care | Payer: Medicare PPO | Source: Ambulatory Visit | Attending: Radiation Oncology | Admitting: Radiation Oncology

## 2023-06-29 ENCOUNTER — Ambulatory Visit (HOSPITAL_COMMUNITY)
Admission: RE | Admit: 2023-06-29 | Discharge: 2023-06-29 | Disposition: A | Discharge status: Home / Self Care | Payer: Medicare PPO | Attending: Gastroenterology | Admitting: Gastroenterology

## 2023-06-29 ENCOUNTER — Encounter (HOSPITAL_COMMUNITY)
Admission: RE | Disposition: A | Discharge status: Home / Self Care | Payer: Self-pay | Source: Home / Self Care | Attending: Gastroenterology

## 2023-06-29 DIAGNOSIS — D124 Benign neoplasm of descending colon: Secondary | ICD-10-CM | POA: Insufficient documentation

## 2023-06-29 DIAGNOSIS — D125 Benign neoplasm of sigmoid colon: Secondary | ICD-10-CM

## 2023-06-29 DIAGNOSIS — C61 Malignant neoplasm of prostate: Secondary | ICD-10-CM | POA: Insufficient documentation

## 2023-06-29 DIAGNOSIS — R970 Elevated carcinoembryonic antigen [CEA]: Secondary | ICD-10-CM | POA: Insufficient documentation

## 2023-06-29 DIAGNOSIS — K635 Polyp of colon: Secondary | ICD-10-CM

## 2023-06-29 DIAGNOSIS — Z86010 Personal history of colonic polyps: Secondary | ICD-10-CM | POA: Diagnosis not present

## 2023-06-29 DIAGNOSIS — D126 Benign neoplasm of colon, unspecified: Secondary | ICD-10-CM

## 2023-06-29 DIAGNOSIS — R897 Abnormal histological findings in specimens from other organs, systems and tissues: Secondary | ICD-10-CM | POA: Diagnosis not present

## 2023-06-29 DIAGNOSIS — K641 Second degree hemorrhoids: Secondary | ICD-10-CM | POA: Diagnosis not present

## 2023-06-29 DIAGNOSIS — D122 Benign neoplasm of ascending colon: Secondary | ICD-10-CM

## 2023-06-29 DIAGNOSIS — K644 Residual hemorrhoidal skin tags: Secondary | ICD-10-CM | POA: Diagnosis not present

## 2023-06-29 DIAGNOSIS — D123 Benign neoplasm of transverse colon: Secondary | ICD-10-CM | POA: Insufficient documentation

## 2023-06-29 DIAGNOSIS — F17210 Nicotine dependence, cigarettes, uncomplicated: Secondary | ICD-10-CM | POA: Diagnosis not present

## 2023-06-29 DIAGNOSIS — K649 Unspecified hemorrhoids: Secondary | ICD-10-CM

## 2023-06-29 DIAGNOSIS — D490 Neoplasm of unspecified behavior of digestive system: Secondary | ICD-10-CM

## 2023-06-29 DIAGNOSIS — Z9889 Other specified postprocedural states: Secondary | ICD-10-CM

## 2023-06-29 HISTORY — PX: SUBMUCOSAL TATTOO INJECTION: SHX6856

## 2023-06-29 HISTORY — PX: BIOPSY: SHX5522

## 2023-06-29 HISTORY — PX: ENDOSCOPIC MUCOSAL RESECTION: SHX6839

## 2023-06-29 HISTORY — PX: POLYPECTOMY: SHX5525

## 2023-06-29 HISTORY — PX: HEMOSTASIS CONTROL: SHX6838

## 2023-06-29 HISTORY — PX: SUBMUCOSAL LIFTING INJECTION: SHX6855

## 2023-06-29 HISTORY — PX: COLONOSCOPY WITH PROPOFOL: SHX5780

## 2023-06-29 LAB — RAD ONC ARIA SESSION SUMMARY
Course Elapsed Days: 36
Plan Fractions Treated to Date: 25
Plan Prescribed Dose Per Fraction: 2 Gy
Plan Total Fractions Prescribed: 40
Plan Total Prescribed Dose: 80 Gy
Reference Point Dosage Given to Date: 50 Gy
Reference Point Session Dosage Given: 2 Gy
Session Number: 25

## 2023-06-29 LAB — GLUCOSE, CAPILLARY
Glucose-Capillary: 106 mg/dL — ABNORMAL HIGH (ref 70–99)
Glucose-Capillary: 101 mg/dL — ABNORMAL HIGH (ref 70–99)

## 2023-06-29 SURGERY — COLONOSCOPY WITH PROPOFOL
Anesthesia: Monitor Anesthesia Care

## 2023-06-29 MED ORDER — SPOT INK MARKER SYRINGE KIT
PACK | SUBMUCOSAL | Status: DC | PRN
  Administered 2023-06-29: 6 mL via SUBMUCOSAL

## 2023-06-29 MED ORDER — LACTATED RINGERS IV SOLN: INTRAVENOUS | Status: DC

## 2023-06-29 MED ORDER — PROPOFOL 1000 MG/100ML IV EMUL
INTRAVENOUS | Status: AC
  Filled 2023-06-29: qty 100

## 2023-06-29 MED ORDER — LABETALOL HCL 5 MG/ML IV SOLN
INTRAVENOUS | Status: DC | PRN
  Administered 2023-06-29: 5 mg via INTRAVENOUS
  Administered 2023-06-29: 10 mg via INTRAVENOUS
  Administered 2023-06-29: 5 mg via INTRAVENOUS

## 2023-06-29 MED ORDER — PROPOFOL 500 MG/50ML IV EMUL
INTRAVENOUS | Status: DC | PRN
  Administered 2023-06-29: 100 mg via INTRAVENOUS
  Administered 2023-06-29: 50 mg via INTRAVENOUS
  Administered 2023-06-29: 100 mg via INTRAVENOUS
  Administered 2023-06-29: 125 ug/kg/min via INTRAVENOUS

## 2023-06-29 MED ORDER — SODIUM CHLORIDE 0.9 % IV SOLN: INTRAVENOUS | Status: DC

## 2023-06-29 MED ORDER — LIDOCAINE 2% (20 MG/ML) 5 ML SYRINGE
INTRAMUSCULAR | Status: DC | PRN
Start: 2023-06-29 — End: 2023-06-29
  Administered 2023-06-29: 100 mg via INTRAVENOUS

## 2023-06-29 MED ORDER — PROPOFOL 10 MG/ML IV BOLUS
INTRAVENOUS | Status: AC
  Filled 2023-06-29: qty 20

## 2023-06-29 SURGICAL SUPPLY — 22 items

## 2023-06-29 NOTE — Discharge Instructions (Signed)

## 2023-06-29 NOTE — Transfer of Care (Signed)
Immediate Anesthesia Transfer of Care Note  Patient: Reon Gene [facility]) Performed: COLONOSCOPY WITH PROPOFOL ENDOSCOPIC MUCOSAL RESECTION SUBMUCOSAL LIFTING INJECTION POLYPECTOMY BIOPSY HEMOSTASIS CONTROL SUBMUCOSAL TATTOO INJECTION  Patient Location: PACU and Endoscopy Unit  Anesthesia Type:MAC  Level of Consciousness: awake and alert   Airway & Oxygen Therapy: Patient Spontanous Breathing  Post-op Assessment: Report given to RN  Post vital signs: stable  Last Vitals:  Vitals Value Taken Time  BP    Temp    Pulse    Resp    SpO2      Last Pain:  Vitals:   06/29/23 1319  TempSrc: Temporal  PainSc: 0-No pain         Complications: No notable events documented.

## 2023-06-29 NOTE — Op Note (Signed)
[facility] Patient Name: Colin Griffin Procedure Date: 06/29/2023 MRN: [id number] Attending MD: Corliss Parish , MD, [id number] Date of Birth: [birth year] CSN: [id number] Age: 72 Admit Type: Outpatient Procedure:                Colonoscopy Indications:              Excision of colonic polyp (referred for 2 polyps                            for advanced resection) Providers:                Corliss Parish, MD, Fransisca Connors, Harrington Challenger, Technician Referring MD:             [name] MD, MD, Nicki Guadalajara Medicines:                Monitored Anesthesia Care Complications:            No immediate complications. Estimated Blood Loss:     Estimated blood loss was minimal. Procedure:                Pre-Anesthesia Assessment:                           - Prior to the procedure, a History and Physical                            was performed, and patient medications and                            allergies were reviewed. The patient's tolerance of                            previous anesthesia was also reviewed. The risks                            and benefits of the procedure and the sedation                            options and risks were discussed with the patient.                            All questions were answered, and informed consent                            was obtained. Prior Anticoagulants: The patient has                            taken no anticoagulant or antiplatelet agents. ASA                            Grade Assessment: III - A patient with severe  systemic disease. After reviewing the risks and                            benefits, the patient was deemed in satisfactory                            condition to undergo the procedure.                           After obtaining informed consent, the colonoscope                            was passed under direct vision. Throughout the                             procedure, the patient's blood pressure, pulse, and                            oxygen saturations were monitored continuously. The                            CF-HQ190L ([id number]) Olympus colonoscope was                            introduced through the anus and advanced to the the                            cecum, identified by appendiceal orifice and                            ileocecal valve. The colonoscopy was somewhat                            difficult. Successful completion of the procedure                            was aided by performing the maneuvers documented                            (below) in this report. The patient tolerated the                            procedure. The quality of the bowel preparation was                            adequate. The ileocecal valve, appendiceal orifice,                            and rectum were photographed. Scope In: 1:53:50 PM Scope Out: 4:01:48 PM Scope Withdrawal Time: 2 hours 2 minutes 16 seconds  Total Procedure Duration: 2 hours 7 minutes 58 seconds  Findings:      The digital rectal exam findings include hemorrhoids. Pertinent       negatives include no palpable rectal lesions.  The colon (entire examined portion) revealed moderately excessive       looping.      A medium post mucosectomy scar was found in the ascending colon.       Adjacent mucosal findings include congestion. 3 hemoclips were still in       place. Did not see evidence of developing recurrence as of yet.      A greater than 50 mm polyp was found in the ascending colon. The polyp       was granular lateral spreading and spread over 2 folds. A tattoo was       noted adjacent to this.. Preparations were made for mucosal resection.       Demarcation of the lesion was performed with high-definition white light       and narrow band imaging to clearly identify the boundaries of the       lesion. EverLift was injected to raise the lesion.  Piecemeal mucosal       resection using a snare was performed. Resection and retrieval were       complete. Resected tissue margins were examined and clear of polyp       tissue. Fulguration to ablate the lesion margin by snare tip soft       coagulation was successful. Due to the size of the defect, area was       successfully injected with 1.5 mL PuraStat for hemostasis.      A greater than 50 mm polyp was found in the transverse colon. The polyp       was granular lateral spreading and spread over 2 folds. Preparations       were made for mucosal resection. Demarcation of the lesion was performed       with high-definition white light and narrow band imaging to clearly       identify the boundaries of the lesion. EverLift was injected to raise       the lesion. Piecemeal mucosal resection using a snare was performed.       Resection and retrieval were complete. Resected tissue margins were       examined and clear of polyp tissue. Fulguration to ablate the lesion       margin by snare tip soft coagulation was successful. Area on       contralateral wall was tattooed with an injection of Spot (carbon       black). Due to the size of the defect, area was successfully injected       with 1.5 mL PuraStat for hemostasis.      A greater than 50 mm polyp was found in the descending colon. The polyp       was sessile and also spread over 2 folds. Preparations were made for       mucosal resection. Demarcation of the lesion was performed with       high-definition white light and narrow band imaging to clearly identify       the boundaries of the lesion. EverLift was injected to raise the lesion.       Piecemeal mucosal resection using a snare was performed. Resection and       retrieval were complete. Resected tissue margins were examined and clear       of polyp tissue. Fulguration to ablate the lesion margin by snare tip       soft coagulation was successful. Area on contralateral wall was  tattooed  with an injection of Spot (carbon black). Due to the size of the defect,       area was successfully injected with 1 mL PuraStat for hemostasis.      A greater than 50 mm polyp was found in the descending colon. The polyp       was granular lateral spreading spread across 75% of the circumference of       a fold. Preparations were made for mucosal resection. Demarcation of the       lesion was performed with high-definition white light and narrow band       imaging to clearly identify the boundaries of the lesion. EverLift was       injected to raise the lesion. Piecemeal mucosal resection using a snare       was performed. Resection and retrieval were complete. Resected tissue       margins were examined and clear of polyp tissue. Fulguration to ablate       the lesion margin by snare tip soft coagulation was successful. Area was       tattooed adjacent to the resection site with an injection of Spot       (carbon black). Area was successfully injected with 1.5 mL PuraStat for       hemostasis.      A 35 mm polypoid lesion was found in the sigmoid colon. This was found       at 35 cm from the anal os. The lesion was Paris classification mixed IIc       + IIa (mucosal depression with raised edges) and had Jnet2a and Jnetb       findings. Preparations were made for mucosal resection. Demarcation of       the lesion was performed with high-definition white light and narrow       band imaging to clearly identify the boundaries of the lesion. EverLift       was injected to raise the lesion but this was only able to lift half of       the lesion. The lesion portion that did not lift had loss of vascular       pattern and was concerning for the potential of more invasiveness or       potential malignancy. Snare mucosal resection was performed. Resection       was incomplete. The resected tissue was retrieved. Biopsies were taken       aggressively from the area of vascular pattern  loss with a cold forceps       for histology. These were all placed together in the same jar. Area was       tattooed distally in 3 separate areas with an injection of Spot (carbon       black) for demarcation purposes.      Four sessile polyps were found in the sigmoid colon (2) and transverse       colon (2). The polyps were 5 to 10 mm in size. These polyps were removed       with a cold snare. Resection and retrieval were complete.      Non-bleeding non-thrombosed external and internal hemorrhoids were found       during retroflexion, during perianal exam and during digital exam. The       hemorrhoids were Grade II (internal hemorrhoids that prolapse but reduce       spontaneously). Impression:               -  Hemorrhoids found on digital rectal exam.                           - There was significant looping of the colon.                           - Post mucosectomy scar in the ascending colon.                           - One greater than 50 mm polyp in the ascending                            colon, removed with piecemeal mucosal resection.                            Resected and retrieved. Treated with STSC. Injected.                           - One greater than 50 mm polyp in the transverse                            colon, removed with piecemeal mucosal resection.                            Resected and retrieved. Treated with STSC. Tattooed                            on contralateral wall. Injected.                           - One greater than 50 mm polyp in the descending                            colon, removed with piecemeal mucosal resection.                            Resected and retrieved. Treated with STSC. Tattooed                            on contralateral wall. Injected.                           - One greater than 50 mm polyp in the descending                            colon, removed with piecemeal mucosal resection.                            Resected and retrieved.  Treated with a STSC.                            Tattooed dis adjacent. Injected.                           -  Rule out malignancy, polypoid lesion in the                            sigmoid colon (35 cm from anal os). Tissue was                            removed. Incomplete resection. Further biopsied.                            Tattooed distally.                           - Four 5 to 10 mm polyps in the sigmoid colon and                            in the transverse colon, removed with a cold snare.                            Resected and retrieved.                           - Non-bleeding non-thrombosed external and internal                            hemorrhoids. Moderate Sedation:      Not Applicable - Patient had care per Anesthesia. Recommendation:           - The patient will be observed post-procedure,                            until all discharge criteria are met.                           - Discharge patient to home.                           - Patient has a contact number available for                            emergencies. The signs and symptoms of potential                            delayed complications were discussed with the                            patient. Return to normal activities tomorrow.                            Written discharge instructions were provided to the                            patient.                           - High fiber diet.                           -  Use FiberCon 1-2 tablets PO daily.                           - Hold nonsteroidals for next 1 week to decrease                            risk of post interventional bleeding.                           - Continue present medications.                           - Await pathology results.                           - Repeat colonoscopy for surveillance based on                            pathology results. If there is evidence of                            malignancy then will require surgical  management.                            If no evidence of malignancy, then consider                            surgical management versus OVESCO FTRD.                           - The findings and recommendations were discussed                            with the patient.                           - The findings and recommendations were discussed                            with the patient's family. Procedure Code(s):        --- Professional ---                           [phone], Colonoscopy, flexible; with endoscopic                            mucosal resection                           [id number], 59, Colonoscopy, flexible; with removal of                            tumor(s), polyp(s), or other lesion(s) by snare                            technique Diagnosis Code(s):        ---  Professional ---                           K64.1, Second degree hemorrhoids                           Z98.890, Other specified postprocedural states                           D12.2, Benign neoplasm of ascending colon                           D12.3, Benign neoplasm of transverse colon (hepatic                            flexure or splenic flexure)                           D12.4, Benign neoplasm of descending colon                           D49.0, Neoplasm of unspecified behavior of                            digestive system                           D12.5, Benign neoplasm of sigmoid colon                           K63.5, Polyp of colon CPT copyright 2022 American Medical Association. All rights reserved. The codes documented in this report are preliminary and upon coder review may  be revised to meet current compliance requirements. Corliss Parish, MD 06/29/2023 4:35:21 PM Number of Addenda: 0

## 2023-06-29 NOTE — H&P (Signed)
GASTROENTEROLOGY PROCEDURE H&P NOTE   Primary Care Physician: Ronnald Ramp, MD  HPI: Colin Griffin is a 72 y.o. male who presents for Colonoscopy with EMR attempt of 2 remaining polyps greater than 50 mm in size in follow-up of recent piecemeal EMR of an ascending colon adenoma.  Past Medical History:  Diagnosis Date   Cancer [facility]) April 2024   Constipation 03/04/2023   Diabetes mellitus without complication [facility]) March 2024   Pre-diabetes    Past Surgical History:  Procedure Laterality Date   APPENDECTOMY     COLONOSCOPY WITH PROPOFOL N/A 04/12/2023   Procedure: COLONOSCOPY WITH PROPOFOL;  Surgeon: [name], MD;  Location: [facility] ENDOSCOPY;  Service: Gastroenterology;  Laterality: N/A;   IR RADIOLOGIST EVAL & MGMT  03/16/2023   PELVIC ABCESS DRAINAGE     March 2024   TRANSURETHRAL RESECTION OF PROSTATE N/A 03/03/2023   Procedure: TRANSURETHRAL RESECTION OF THE PROSTATE (TURP)/ UNROOFING OF PROSTATE ABSCESS;  Surgeon: Riki Altes, MD;  Location: [facility] ORS;  Service: Urology;  Laterality: N/A;   Current Facility-Administered Medications  Medication Dose Route Frequency Provider Last Rate Last Admin   0.9 %  sodium chloride infusion   Intravenous Continuous Mansouraty, Netty Starring., MD        Current Facility-Administered Medications:    0.9 %  sodium chloride infusion, , Intravenous, Continuous, Mansouraty, Netty Starring., MD No Known Allergies Family History  Problem Relation Age of Onset   Atrial fibrillation Mother    Rectal cancer Sister    Diabetes Brother    Cancer Maternal Grandmother    Alcohol abuse Maternal Grandfather    Diabetes Other    Colon cancer Neg Hx    Colon polyps Neg Hx    Stomach cancer Neg Hx    Esophageal cancer Neg Hx    Social History   Socioeconomic History   Marital status: Married    Spouse name: Editor, commissioning   Number of children: 1   Years of education: Not on file   Highest education level: Master's  degree (e.g., MA, MS, MEng, MEd, MSW, MBA)  Occupational History   Not on file  Tobacco Use   Smoking status: Every Day    Current packs/day: 1.00    Average packs/day: 1 pack/day for 54.0 years (54.0 ttl pk-yrs)    Types: Cigarettes   Smokeless tobacco: Never   Tobacco comments:    Reports that at one time he smoked 3 packs per day while in the [employer]  Vaping Use   Vaping status: Every Day   Start date: 08/08/2022   Substances: Nicotine  Substance and Sexual Activity   Alcohol use: Never   Drug use: Never   Sexual activity: Not Currently    Birth control/protection: None  Other Topics Concern   Not on file  Social History Narrative   ** Merged History Encounter **       Retired from Agricultural consultant radiology for 30 years    Social Determinants of Corporate investment banker Strain: Low Risk  (05/01/2023)   Overall Financial Resource Strain (CARDIA)    Difficulty of Paying Living Expenses: Not hard at all  Food Insecurity: No Food Insecurity (05/01/2023)   Hunger Vital Sign    Worried About Running Out of Food in the Last Year: Never true    Ran Out of Food in the Last Year: Never true  Transportation Needs: No Transportation Needs (05/01/2023)   PRAPARE - Transportation    Lack of Transportation (  Medical): No    Lack of Transportation (Non-Medical): No  Physical Activity: Sufficiently Active (05/01/2023)   Exercise Vital Sign    Days of Exercise per Week: 6 days    Minutes of Exercise per Session: 150+ min  Stress: No Stress Concern Present (05/01/2023)   [employer] of Occupational Health - Occupational Stress Questionnaire    Feeling of Stress : Not at all  Social Connections: Moderately Integrated (05/01/2023)   Social Connection and Isolation Panel [NHANES]    Frequency of Communication with Friends and Family: Three times a week    Frequency of Social Gatherings with Friends and Family: Twice a week    Attends Religious Services: More than 4 times per year    Active  Member of [employer] or Organizations: No    Attends Banker Meetings: Patient declined    Marital Status: Married  Catering manager Violence: Not At Risk (05/02/2023)   Humiliation, Afraid, Rape, and Kick questionnaire    Fear of Current or Ex-Partner: No    Emotionally Abused: No    Physically Abused: No    Sexually Abused: No    Physical Exam: Today's Vitals   06/29/23 1319  Temp: (!) 97.3 F (36.3 C)   There is no height or weight on file to calculate BMI. GEN: NAD EYE: Sclerae anicteric ENT: MMM CV: Non-tachycardic GI: Soft, NT/ND NEURO:  Alert & Oriented x 3  Lab Results: No results for input(s): "WBC", "HGB", "HCT", "PLT" in the last 72 hours. BMET No results for input(s): "NA", "K", "CL", "CO2", "GLUCOSE", "BUN", "CREATININE", "CALCIUM" in the last 72 hours. LFT No results for input(s): "PROT", "ALBUMIN", "AST", "ALT", "ALKPHOS", "BILITOT", "BILIDIR", "IBILI" in the last 72 hours. PT/INR No results for input(s): "LABPROT", "INR" in the last 72 hours.   Impression / Plan: This is a 72 y.o.male who presents for Colonoscopy with EMR attempt of 2 remaining polyps greater than 50 mm in size in follow-up of recent piecemeal EMR of an ascending colon adenoma.  Based upon the description and endoscopic pictures I do feel that it is reasonable to pursue an Advanced Polypectomy attempt of the polyp/lesion.  We discussed some of the techniques of advanced polypectomy which include Endoscopic Mucosal Resection, OVESCO Full-Thickness Resection, Endorotor Morcellation, and Tissue Ablation via Fulguration.  We also reviewed images of typical techniques as noted above.  The risks and benefits of endoscopic evaluation were discussed with the patient; these include but are not limited to the risk of perforation, infection, bleeding, missed lesions, lack of diagnosis, severe illness requiring hospitalization, as well as anesthesia and sedation related illnesses.  During attempts at  advanced resection, the risks of bleeding and perforation/leak are increased as opposed to diagnostic and screening procedures, and that was discussed with the patient as well.   In addition, I explained that with the possible need for piecemeal resection, subsequent short-interval endoscopic evaluation for follow up and potential retreatment of the lesion/area may be necessary.  If, after attempt at removal of the polyp/lesion, it is found that the patient has a complication or that an invasive lesion or malignant lesion is found, or that the polyp/lesion continues to recur, the patient is aware and understands that surgery may still be indicated/required.  All patient questions were answered, to the best of my ability, and the patient agrees to the aforementioned plan of action with follow-up as indicated.   The risks and benefits of endoscopic evaluation/treatment were discussed with the patient and/or family; these include  but are not limited to the risk of perforation, infection, bleeding, missed lesions, lack of diagnosis, severe illness requiring hospitalization, as well as anesthesia and sedation related illnesses.  The patient's history has been reviewed, patient examined, no change in status, and deemed stable for procedure.  The patient and/or family is agreeable to proceed.    Corliss Parish, MD [facility] Advanced Endoscopy Office # [phone]

## 2023-06-30 ENCOUNTER — Ambulatory Visit: Payer: Medicare PPO

## 2023-06-30 ENCOUNTER — Other Ambulatory Visit: Payer: Self-pay

## 2023-06-30 DIAGNOSIS — C61 Malignant neoplasm of prostate: Secondary | ICD-10-CM | POA: Diagnosis present

## 2023-06-30 DIAGNOSIS — R970 Elevated carcinoembryonic antigen [CEA]: Secondary | ICD-10-CM | POA: Diagnosis not present

## 2023-06-30 LAB — RAD ONC ARIA SESSION SUMMARY
Course Elapsed Days: 37
Plan Fractions Treated to Date: 26
Plan Prescribed Dose Per Fraction: 2 Gy
Plan Total Fractions Prescribed: 40
Plan Total Prescribed Dose: 80 Gy
Reference Point Dosage Given to Date: 52 Gy
Reference Point Session Dosage Given: 2 Gy
Session Number: 26

## 2023-07-01 ENCOUNTER — Encounter: Payer: Self-pay | Admitting: Gastroenterology

## 2023-07-01 DIAGNOSIS — Z800 Family history of malignant neoplasm of digestive organs: Secondary | ICD-10-CM | POA: Insufficient documentation

## 2023-07-01 DIAGNOSIS — Z86010 Personal history of colonic polyps: Secondary | ICD-10-CM | POA: Insufficient documentation

## 2023-07-01 DIAGNOSIS — R933 Abnormal findings on diagnostic imaging of other parts of digestive tract: Secondary | ICD-10-CM | POA: Insufficient documentation

## 2023-07-02 NOTE — Anesthesia Postprocedure Evaluation (Signed)
Anesthesia Post Note  Patient: Colin Griffin  Procedure(s) Performed: COLONOSCOPY WITH PROPOFOL ENDOSCOPIC MUCOSAL RESECTION SUBMUCOSAL LIFTING INJECTION POLYPECTOMY BIOPSY HEMOSTASIS CONTROL SUBMUCOSAL TATTOO INJECTION     Patient location during evaluation: Endoscopy Anesthesia Type: MAC Level of consciousness: awake and alert Pain management: pain level controlled Vital Signs Assessment: post-procedure vital signs reviewed and stable Respiratory status: spontaneous breathing, nonlabored ventilation, respiratory function stable and patient connected to nasal cannula oxygen Cardiovascular status: blood pressure returned to baseline and stable Postop Assessment: no apparent nausea or vomiting Anesthetic complications: no  No notable events documented.  Last Vitals:  Vitals:   06/29/23 1636 06/29/23 1643  BP: 134/63 130/65  Pulse: (!) 57 (!) 54  Resp: 19 10  Temp:    SpO2: 97% 96%    Last Pain:  Vitals:   06/29/23 1643  TempSrc:   PainSc: 0-No pain                  L 

## 2023-07-03 ENCOUNTER — Other Ambulatory Visit: Payer: Self-pay

## 2023-07-03 ENCOUNTER — Ambulatory Visit: Payer: Medicare PPO

## 2023-07-03 ENCOUNTER — Ambulatory Visit
Admission: RE | Admit: 2023-07-03 | Discharge: 2023-07-03 | Disposition: A | Discharge status: Home / Self Care | Payer: Medicare PPO | Source: Ambulatory Visit | Attending: Radiation Oncology | Admitting: Radiation Oncology

## 2023-07-03 DIAGNOSIS — C61 Malignant neoplasm of prostate: Secondary | ICD-10-CM | POA: Diagnosis not present

## 2023-07-03 LAB — RAD ONC ARIA SESSION SUMMARY
Course Elapsed Days: 40
Plan Fractions Treated to Date: 27
Plan Prescribed Dose Per Fraction: 2 Gy
Plan Total Fractions Prescribed: 40
Plan Total Prescribed Dose: 80 Gy
Reference Point Dosage Given to Date: 54 Gy
Reference Point Session Dosage Given: 2 Gy
Session Number: 27

## 2023-07-04 ENCOUNTER — Other Ambulatory Visit: Payer: Self-pay

## 2023-07-04 ENCOUNTER — Encounter: Payer: Self-pay | Admitting: Gastroenterology

## 2023-07-04 ENCOUNTER — Ambulatory Visit: Payer: Medicare PPO

## 2023-07-04 ENCOUNTER — Ambulatory Visit
Admission: RE | Admit: 2023-07-04 | Discharge: 2023-07-04 | Disposition: A | Discharge status: Home / Self Care | Payer: Medicare PPO | Source: Ambulatory Visit | Attending: Radiation Oncology | Admitting: Radiation Oncology

## 2023-07-04 DIAGNOSIS — C61 Malignant neoplasm of prostate: Secondary | ICD-10-CM | POA: Diagnosis not present

## 2023-07-04 DIAGNOSIS — K639 Disease of intestine, unspecified: Secondary | ICD-10-CM

## 2023-07-04 DIAGNOSIS — R933 Abnormal findings on diagnostic imaging of other parts of digestive tract: Secondary | ICD-10-CM

## 2023-07-04 LAB — RAD ONC ARIA SESSION SUMMARY
Course Elapsed Days: 41
Plan Fractions Treated to Date: 28
Plan Prescribed Dose Per Fraction: 2 Gy
Plan Total Fractions Prescribed: 40
Plan Total Prescribed Dose: 80 Gy
Reference Point Dosage Given to Date: 56 Gy
Reference Point Session Dosage Given: 2 Gy
Session Number: 28

## 2023-07-04 MED ORDER — NA SULFATE-K SULFATE-MG SULF 17.5-3.13-1.6 GM/177ML PO SOLN: 1.0000 | Freq: Once | ORAL | 0 refills | Status: AC

## 2023-07-05 ENCOUNTER — Other Ambulatory Visit: Payer: Self-pay

## 2023-07-05 ENCOUNTER — Ambulatory Visit: Payer: Medicare PPO

## 2023-07-05 ENCOUNTER — Ambulatory Visit
Admission: RE | Admit: 2023-07-05 | Discharge: 2023-07-05 | Disposition: A | Discharge status: Home / Self Care | Payer: Medicare PPO | Source: Ambulatory Visit | Attending: Radiation Oncology | Admitting: Radiation Oncology

## 2023-07-05 DIAGNOSIS — C61 Malignant neoplasm of prostate: Secondary | ICD-10-CM | POA: Diagnosis not present

## 2023-07-05 LAB — RAD ONC ARIA SESSION SUMMARY
Course Elapsed Days: 42
Plan Fractions Treated to Date: 29
Plan Prescribed Dose Per Fraction: 2 Gy
Plan Total Fractions Prescribed: 40
Plan Total Prescribed Dose: 80 Gy
Reference Point Dosage Given to Date: 58 Gy
Reference Point Session Dosage Given: 2 Gy
Session Number: 29

## 2023-07-06 ENCOUNTER — Encounter: Payer: Self-pay | Admitting: Gastroenterology

## 2023-07-06 ENCOUNTER — Ambulatory Visit
Admission: RE | Admit: 2023-07-06 | Discharge: 2023-07-06 | Disposition: A | Discharge status: Home / Self Care | Payer: Medicare PPO | Source: Ambulatory Visit | Attending: Radiation Oncology | Admitting: Radiation Oncology

## 2023-07-06 ENCOUNTER — Ambulatory Visit: Payer: Medicare PPO

## 2023-07-06 ENCOUNTER — Other Ambulatory Visit: Payer: Self-pay | Admitting: *Deleted

## 2023-07-06 ENCOUNTER — Inpatient Hospital Stay: Payer: Medicare PPO

## 2023-07-06 ENCOUNTER — Other Ambulatory Visit: Payer: Self-pay

## 2023-07-06 DIAGNOSIS — R970 Elevated carcinoembryonic antigen [CEA]: Secondary | ICD-10-CM | POA: Insufficient documentation

## 2023-07-06 DIAGNOSIS — C61 Malignant neoplasm of prostate: Secondary | ICD-10-CM | POA: Insufficient documentation

## 2023-07-06 DIAGNOSIS — D649 Anemia, unspecified: Secondary | ICD-10-CM | POA: Insufficient documentation

## 2023-07-06 LAB — CBC WITH DIFFERENTIAL (CANCER CENTER ONLY)
Abs Immature Granulocytes: 0.04 10*3/uL (ref 0.00–0.07)
Basophils Absolute: 0 10*3/uL (ref 0.0–0.1)
Basophils Relative: 0 %
Eosinophils Absolute: 0.2 10*3/uL (ref 0.0–0.5)
Eosinophils Relative: 3 %
HCT: 34.5 % — ABNORMAL LOW (ref 39.0–52.0)
Hemoglobin: 11.7 g/dL — ABNORMAL LOW (ref 13.0–17.0)
Immature Granulocytes: 1 %
Lymphocytes Relative: 18 %
Lymphs Abs: 1.4 10*3/uL (ref 0.7–4.0)
MCH: 31.8 pg (ref 26.0–34.0)
MCHC: 33.9 g/dL (ref 30.0–36.0)
MCV: 93.8 fL (ref 80.0–100.0)
Monocytes Absolute: 0.7 10*3/uL (ref 0.1–1.0)
Monocytes Relative: 9 %
Neutro Abs: 5.5 10*3/uL (ref 1.7–7.7)
Neutrophils Relative %: 69 %
Platelet Count: 214 10*3/uL (ref 150–400)
RBC: 3.68 MIL/uL — ABNORMAL LOW (ref 4.22–5.81)
RDW: 12.9 % (ref 11.5–15.5)
WBC Count: 7.9 10*3/uL (ref 4.0–10.5)
nRBC: 0 % (ref 0.0–0.2)

## 2023-07-06 LAB — RAD ONC ARIA SESSION SUMMARY
Course Elapsed Days: 43
Plan Fractions Treated to Date: 30
Plan Prescribed Dose Per Fraction: 2 Gy
Plan Total Fractions Prescribed: 40
Plan Total Prescribed Dose: 80 Gy
Reference Point Dosage Given to Date: 60 Gy
Reference Point Session Dosage Given: 2 Gy
Session Number: 30

## 2023-07-06 LAB — PROTIME-INR
INR: 0.9 (ref 0.8–1.2)
Prothrombin Time: 12.1 seconds (ref 11.4–15.2)

## 2023-07-06 LAB — CMP (CANCER CENTER ONLY)
ALT: 21 U/L (ref 0–44)
AST: 17 U/L (ref 15–41)
Albumin: 3.9 g/dL (ref 3.5–5.0)
Alkaline Phosphatase: 56 U/L (ref 38–126)
Anion gap: 7 (ref 5–15)
BUN: 25 mg/dL — ABNORMAL HIGH (ref 8–23)
CO2: 22 mmol/L (ref 22–32)
Calcium: 9.1 mg/dL (ref 8.9–10.3)
Chloride: 106 mmol/L (ref 98–111)
Creatinine: 1.04 mg/dL (ref 0.61–1.24)
GFR, Estimated: >60 mL/min (ref >60)
Glucose, Bld: 181 mg/dL — ABNORMAL HIGH (ref 70–99)
Potassium: 4.4 mmol/L (ref 3.5–5.1)
Sodium: 135 mmol/L (ref 135–145)
Total Bilirubin: 0.2 mg/dL — ABNORMAL LOW (ref 0.3–1.2)
Total Protein: 6.9 g/dL (ref 6.5–8.1)

## 2023-07-07 ENCOUNTER — Ambulatory Visit
Admission: RE | Admit: 2023-07-07 | Discharge: 2023-07-07 | Disposition: A | Discharge status: Home / Self Care | Payer: Medicare PPO | Source: Ambulatory Visit | Attending: Radiation Oncology | Admitting: Radiation Oncology

## 2023-07-07 ENCOUNTER — Ambulatory Visit: Payer: Medicare PPO

## 2023-07-07 ENCOUNTER — Other Ambulatory Visit: Payer: Self-pay

## 2023-07-07 DIAGNOSIS — C61 Malignant neoplasm of prostate: Secondary | ICD-10-CM | POA: Diagnosis not present

## 2023-07-07 LAB — RAD ONC ARIA SESSION SUMMARY
Course Elapsed Days: 44
Plan Fractions Treated to Date: 31
Plan Prescribed Dose Per Fraction: 2 Gy
Plan Total Fractions Prescribed: 40
Plan Total Prescribed Dose: 80 Gy
Reference Point Dosage Given to Date: 62 Gy
Reference Point Session Dosage Given: 2 Gy
Session Number: 31

## 2023-07-10 ENCOUNTER — Ambulatory Visit
Admission: RE | Admit: 2023-07-10 | Discharge: 2023-07-10 | Disposition: A | Discharge status: Home / Self Care | Payer: Medicare PPO | Source: Ambulatory Visit | Attending: Radiation Oncology | Admitting: Radiation Oncology

## 2023-07-10 ENCOUNTER — Other Ambulatory Visit: Payer: Self-pay

## 2023-07-10 DIAGNOSIS — C61 Malignant neoplasm of prostate: Secondary | ICD-10-CM | POA: Diagnosis not present

## 2023-07-10 LAB — RAD ONC ARIA SESSION SUMMARY
Course Elapsed Days: 47
Plan Fractions Treated to Date: 32
Plan Prescribed Dose Per Fraction: 2 Gy
Plan Total Fractions Prescribed: 40
Plan Total Prescribed Dose: 80 Gy
Reference Point Dosage Given to Date: 64 Gy
Reference Point Session Dosage Given: 2 Gy
Session Number: 32

## 2023-07-10 NOTE — Telephone Encounter (Signed)
===  View-only below this line=== ----- Message ----- From: Lemar Lofty., MD Sent: 07/06/2023  12:56 PM EDT To: [name], MD; Richardson Chiquito, RN; *  Dottie, Is there a way that you can be on lookout for CEA level as it has not returned, but won't come to my Inbox? Please see if we can get Iron/TIBC/Ferritin/B12/Folate checked for his anemia. Let patient know his anemia is overall stable but we will try to get further information as to the cause. GM ----- Mess

## 2023-07-10 NOTE — Telephone Encounter (Signed)
Dottie do you have a fax number for [facility]? I will fax the order over.

## 2023-07-10 NOTE — Addendum Note (Signed)
Addended by: Richardson Chiquito on: 07/10/2023 09:45 AM   Modules accepted: Orders

## 2023-07-10 NOTE — Telephone Encounter (Signed)
Orders have been faxed to [facility] in [place], [place] (fax [phone]).

## 2023-07-11 ENCOUNTER — Other Ambulatory Visit: Payer: Self-pay

## 2023-07-11 ENCOUNTER — Ambulatory Visit
Admission: RE | Admit: 2023-07-11 | Discharge: 2023-07-11 | Disposition: A | Discharge status: Home / Self Care | Payer: Medicare PPO | Source: Ambulatory Visit | Attending: Radiation Oncology | Admitting: Radiation Oncology

## 2023-07-11 DIAGNOSIS — C61 Malignant neoplasm of prostate: Secondary | ICD-10-CM | POA: Diagnosis not present

## 2023-07-11 LAB — RAD ONC ARIA SESSION SUMMARY
Course Elapsed Days: 48
Plan Fractions Treated to Date: 33
Plan Prescribed Dose Per Fraction: 2 Gy
Plan Total Fractions Prescribed: 40
Plan Total Prescribed Dose: 80 Gy
Reference Point Dosage Given to Date: 66 Gy
Reference Point Session Dosage Given: 2 Gy
Session Number: 33

## 2023-07-12 ENCOUNTER — Ambulatory Visit: Admission: RE | Admit: 2023-07-12 | Payer: Medicare PPO | Source: Ambulatory Visit

## 2023-07-12 ENCOUNTER — Other Ambulatory Visit: Payer: Self-pay

## 2023-07-12 DIAGNOSIS — C61 Malignant neoplasm of prostate: Secondary | ICD-10-CM | POA: Diagnosis not present

## 2023-07-12 LAB — RAD ONC ARIA SESSION SUMMARY
Course Elapsed Days: 49
Plan Fractions Treated to Date: 34
Plan Prescribed Dose Per Fraction: 2 Gy
Plan Total Fractions Prescribed: 40
Plan Total Prescribed Dose: 80 Gy
Reference Point Dosage Given to Date: 68 Gy
Reference Point Session Dosage Given: 2 Gy
Session Number: 34

## 2023-07-13 ENCOUNTER — Ambulatory Visit
Admission: RE | Admit: 2023-07-13 | Discharge: 2023-07-13 | Disposition: A | Discharge status: Home / Self Care | Payer: Medicare PPO | Source: Ambulatory Visit | Attending: Radiation Oncology | Admitting: Radiation Oncology

## 2023-07-13 ENCOUNTER — Other Ambulatory Visit: Payer: Self-pay

## 2023-07-13 DIAGNOSIS — C61 Malignant neoplasm of prostate: Secondary | ICD-10-CM | POA: Diagnosis not present

## 2023-07-13 LAB — RAD ONC ARIA SESSION SUMMARY
Course Elapsed Days: 50
Plan Fractions Treated to Date: 35
Plan Prescribed Dose Per Fraction: 2 Gy
Plan Total Fractions Prescribed: 40
Plan Total Prescribed Dose: 80 Gy
Reference Point Dosage Given to Date: 70 Gy
Reference Point Session Dosage Given: 2 Gy
Session Number: 35

## 2023-07-14 ENCOUNTER — Ambulatory Visit
Admission: RE | Admit: 2023-07-14 | Discharge: 2023-07-14 | Disposition: A | Discharge status: Home / Self Care | Payer: Medicare PPO | Source: Ambulatory Visit | Attending: Radiation Oncology | Admitting: Radiation Oncology

## 2023-07-14 ENCOUNTER — Other Ambulatory Visit: Payer: Self-pay

## 2023-07-14 DIAGNOSIS — C61 Malignant neoplasm of prostate: Secondary | ICD-10-CM | POA: Diagnosis not present

## 2023-07-14 LAB — RAD ONC ARIA SESSION SUMMARY
Course Elapsed Days: 51
Plan Fractions Treated to Date: 36
Plan Prescribed Dose Per Fraction: 2 Gy
Plan Total Fractions Prescribed: 40
Plan Total Prescribed Dose: 80 Gy
Reference Point Dosage Given to Date: 72 Gy
Reference Point Session Dosage Given: 2 Gy
Session Number: 36

## 2023-07-15 ENCOUNTER — Ambulatory Visit: Payer: Medicare PPO

## 2023-07-17 ENCOUNTER — Ambulatory Visit: Payer: Medicare PPO

## 2023-07-17 ENCOUNTER — Other Ambulatory Visit: Payer: Self-pay

## 2023-07-17 ENCOUNTER — Ambulatory Visit: Admission: RE | Admit: 2023-07-17 | Payer: Medicare PPO | Source: Ambulatory Visit

## 2023-07-17 DIAGNOSIS — C61 Malignant neoplasm of prostate: Secondary | ICD-10-CM | POA: Diagnosis not present

## 2023-07-17 LAB — RAD ONC ARIA SESSION SUMMARY
Course Elapsed Days: 54
Plan Fractions Treated to Date: 37
Plan Prescribed Dose Per Fraction: 2 Gy
Plan Total Fractions Prescribed: 40
Plan Total Prescribed Dose: 80 Gy
Reference Point Dosage Given to Date: 74 Gy
Reference Point Session Dosage Given: 2 Gy
Session Number: 37

## 2023-07-18 ENCOUNTER — Other Ambulatory Visit: Payer: Self-pay

## 2023-07-18 ENCOUNTER — Ambulatory Visit: Admission: RE | Admit: 2023-07-18 | Payer: Medicare PPO | Source: Ambulatory Visit

## 2023-07-18 DIAGNOSIS — C61 Malignant neoplasm of prostate: Secondary | ICD-10-CM | POA: Diagnosis not present

## 2023-07-18 LAB — RAD ONC ARIA SESSION SUMMARY
Course Elapsed Days: 55
Plan Fractions Treated to Date: 38
Plan Prescribed Dose Per Fraction: 2 Gy
Plan Total Fractions Prescribed: 40
Plan Total Prescribed Dose: 80 Gy
Reference Point Dosage Given to Date: 76 Gy
Reference Point Session Dosage Given: 2 Gy
Session Number: 38

## 2023-07-19 ENCOUNTER — Ambulatory Visit: Payer: Medicare PPO

## 2023-07-19 ENCOUNTER — Other Ambulatory Visit: Payer: Self-pay

## 2023-07-19 ENCOUNTER — Ambulatory Visit
Admission: RE | Admit: 2023-07-19 | Discharge: 2023-07-19 | Disposition: A | Discharge status: Home / Self Care | Payer: Medicare PPO | Source: Ambulatory Visit | Attending: Radiation Oncology | Admitting: Radiation Oncology

## 2023-07-19 DIAGNOSIS — C61 Malignant neoplasm of prostate: Secondary | ICD-10-CM | POA: Diagnosis not present

## 2023-07-19 LAB — RAD ONC ARIA SESSION SUMMARY
Course Elapsed Days: 56
Plan Fractions Treated to Date: 39
Plan Prescribed Dose Per Fraction: 2 Gy
Plan Total Fractions Prescribed: 40
Plan Total Prescribed Dose: 80 Gy
Reference Point Dosage Given to Date: 78 Gy
Reference Point Session Dosage Given: 2 Gy
Session Number: 39

## 2023-07-20 ENCOUNTER — Other Ambulatory Visit: Payer: Self-pay

## 2023-07-20 ENCOUNTER — Ambulatory Visit
Admission: RE | Admit: 2023-07-20 | Discharge: 2023-07-20 | Disposition: A | Discharge status: Home / Self Care | Payer: Medicare PPO | Source: Ambulatory Visit | Attending: Radiation Oncology | Admitting: Radiation Oncology

## 2023-07-20 DIAGNOSIS — C61 Malignant neoplasm of prostate: Secondary | ICD-10-CM | POA: Diagnosis not present

## 2023-07-20 LAB — RAD ONC ARIA SESSION SUMMARY
Course Elapsed Days: 57
Plan Fractions Treated to Date: 40
Plan Prescribed Dose Per Fraction: 2 Gy
Plan Total Fractions Prescribed: 40
Plan Total Prescribed Dose: 80 Gy
Reference Point Dosage Given to Date: 80 Gy
Reference Point Session Dosage Given: 2 Gy
Session Number: 40

## 2023-08-16 ENCOUNTER — Ambulatory Visit: Payer: Medicare PPO | Admitting: Family Medicine

## 2023-08-21 ENCOUNTER — Encounter: Payer: Self-pay | Admitting: Radiation Oncology

## 2023-08-21 ENCOUNTER — Ambulatory Visit
Admission: RE | Admit: 2023-08-21 | Discharge: 2023-08-21 | Disposition: A | Discharge status: Home / Self Care | Payer: Medicare PPO | Source: Ambulatory Visit | Attending: Radiation Oncology | Admitting: Radiation Oncology

## 2023-08-21 VITALS — BP 143/76 | HR 67 | Temp 95.7°F | Resp 16 | Ht 73.0 in | Wt 173.9 lb

## 2023-08-21 DIAGNOSIS — Z923 Personal history of irradiation: Secondary | ICD-10-CM | POA: Insufficient documentation

## 2023-08-21 DIAGNOSIS — C61 Malignant neoplasm of prostate: Secondary | ICD-10-CM | POA: Diagnosis present

## 2023-08-21 NOTE — Progress Notes (Signed)
Radiation Oncology Follow up Note  Name: Colin Griffin   Date:   08/21/2023 MRN:  [id number] DOB: 01/15/1951    This 72 y.o. male presents to the clinic today for 1 month follow-up status post IMRT radiation therapy to his prostate for Gleason 7 (3+4) adenocarcinoma prostate.Marland Kitchen  REFERRING PROVIDER: Brett Albino*  HPI: Patient is a 72 year old male now out 1 month having completed image guided IMRT radiation therapy for Gleason 7 adenocarcinoma the prostate.  He is seen today in routine follow-up and doing well specifically denies any increased lower lower urinary tract symptoms diarrhea or fatigue..  COMPLICATIONS OF TREATMENT: none  FOLLOW UP COMPLIANCE: keeps appointments   PHYSICAL EXAM:  BP (!) 143/76   Pulse 67   Temp (!) 95.7 F (35.4 C)   Resp 16   Ht 6\' 1"  (1.854 m)   Wt 173 lb 14.4 oz (78.9 kg)   BMI 22.94 kg/m  Well-developed well-nourished patient in NAD. HEENT reveals PERLA, EOMI, discs not visualized.  Oral cavity is clear. No oral mucosal lesions are identified. Neck is clear without evidence of cervical or supraclavicular adenopathy. Lungs are clear to A&P. Cardiac examination is essentially unremarkable with regular rate and rhythm without murmur rub or thrill. Abdomen is benign with no organomegaly or masses noted. Motor sensory and DTR levels are equal and symmetric in the upper and lower extremities. Cranial nerves II through XII are grossly intact. Proprioception is intact. No peripheral adenopathy or edema is identified. No motor or sensory levels are noted. Crude visual fields are within normal range.  RADIOLOGY RESULTS: No current films for review  PLAN: Present time patient is doing well 1 month out from IMRT radiation therapy to his prostate.  And pleased with his overall progress and low side effect profile.  Of asked to see him back in 4 months with a PSA at that time.  Patient knows to call with any concerns.  I would like to take this  opportunity to thank you for allowing me to participate in the care of your patient.Carmina Miller, MD

## 2023-08-22 NOTE — Progress Notes (Signed)
Sent message, via epic in basket, requesting orders in epic from surgeon.  

## 2023-08-25 NOTE — Progress Notes (Signed)
Second request for pre op orders in [facility]: Spoke with Joni Reining

## 2023-08-26 ENCOUNTER — Ambulatory Visit: Payer: Self-pay | Admitting: Surgery

## 2023-08-26 DIAGNOSIS — Z01818 Encounter for other preprocedural examination: Secondary | ICD-10-CM

## 2023-08-28 NOTE — Patient Instructions (Addendum)
SURGICAL WAITING ROOM VISITATION  Patients having surgery or a procedure may have no more than 2 support people in the waiting area - these visitors may rotate.    Children under the age of 48 must have an adult with them who is not the patient.  Due to an increase in RSV and influenza rates and associated hospitalizations, children ages 43 and under may not visit patients in [facility] hospitals.  If the patient needs to stay at the hospital during part of their recovery, the visitor guidelines for inpatient rooms apply. Pre-op nurse will coordinate an appropriate time for 1 support person to accompany patient in pre-op.  This support person may not rotate.    Please refer to the [facility] website for the visitor guidelines for Inpatients (after your surgery is over and you are in a regular room).       Your procedure is scheduled on: 09/04/23   Report to [facility] Main Entrance    Report to admitting at 7:15 AM   Call this number if you have problems the morning of surgery [phone]   Do not eat food :After Midnight.   After Midnight you may have the following liquids until 6:30 AM DAY OF SURGERY  Water Non-Citrus Juices (without pulp, NO RED-Apple, White grape, White cranberry) Black Coffee (NO MILK/CREAM OR CREAMERS, sugar ok)  Clear Tea (NO MILK/CREAM OR CREAMERS, sugar ok) regular and decaf                             Plain Jell-O (NO RED)                                           Fruit ices (not with fruit pulp, NO RED)                                     Popsicles (NO RED)                                                               Sports drinks like Gatorade (NO RED)              Drink 2 G2 drinks AT 10:00 PM the night before surgery.        The day of surgery:  Drink ONE (1) Pre-Surgery  G2 at 6:30 AM the morning of surgery. Drink in one sitting. Do not sip.  This drink was given to you during your hospital  pre-op appointment visit. Nothing else  to drink after completing the  Pre-Surgery G2.    FOLLOW BOWEL PREP AND ANY ADDITIONAL PRE OP INSTRUCTIONS YOU RECEIVED FROM YOUR SURGEON'S OFFICE!!!     Oral Hygiene is also important to reduce your risk of infection.                                    Remember - BRUSH YOUR TEETH THE MORNING OF SURGERY WITH YOUR REGULAR TOOTHPASTE  DENTURES WILL  BE REMOVED PRIOR TO SURGERY PLEASE DO NOT APPLY "Poly grip" OR ADHESIVES!!!   Stop all vitamins and herbal supplements 7 days before surgery.   Take these medicines the morning of surgery: NONE  DO NOT TAKE ANY ORAL DIABETIC MEDICATIONS DAY OF YOUR SURGERY Do not take Metformin the day of surgery. Hold Trulicity injection for at least 7 days prior to surgery. The last dose is 10/             You may not have any metal on your body including hair pins, jewelry, and body piercing             Do not wear make-up, lotions, powders, cologne, or deodorant              Men may shave face and neck.   Do not bring valuables to the hospital. [facility] IS NOT             RESPONSIBLE   FOR VALUABLES.   Contacts, glasses, dentures or bridgework may not be worn into surgery.   Bring small overnight bag day of surgery.   DO NOT BRING YOUR HOME MEDICATIONS TO THE HOSPITAL. PHARMACY WILL DISPENSE MEDICATIONS LISTED ON YOUR MEDICATION LIST TO YOU DURING YOUR ADMISSION IN THE HOSPITAL!    Patients discharged on the day of surgery will not be allowed to drive home.  Someone NEEDS to stay with you for the first 24 hours after anesthesia.   Special Instructions: Bring a copy of your healthcare power of attorney and living will documents the day of surgery if you haven't scanned them before.              Please read over the following fact sheets you were given: IF YOU HAVE QUESTIONS ABOUT YOUR PRE-OP INSTRUCTIONS PLEASE CALL [phone] Rosey Bath   If you received a COVID test during your pre-op visit  it is requested that you wear a mask when out in  public, stay away from anyone that may not be feeling well and notify your surgeon if you develop symptoms. If you test positive for Covid or have been in contact with anyone that has tested positive in the last 10 days please notify you surgeon.    [facility] - Preparing for Surgery Before surgery, you can play an important role.  Because skin is not sterile, your skin needs to be as free of germs as possible.  You can reduce the number of germs on your skin by washing with CHG (chlorahexidine gluconate) soap before surgery.  CHG is an antiseptic cleaner which kills germs and bonds with the skin to continue killing germs even after washing. Please DO NOT use if you have an allergy to CHG or antibacterial soaps.  If your skin becomes reddened/irritated stop using the CHG and inform your nurse when you arrive at Short Stay. Do not shave (including legs and underarms) for at least 48 hours prior to the first CHG shower.  You may shave your face/neck.  Please follow these instructions carefully:  1.  Shower with CHG Soap the night before surgery and the  morning of surgery.  2.  If you choose to wash your hair, wash your hair first as usual with your normal  shampoo.  3.  After you shampoo, rinse your hair and body thoroughly to remove the shampoo.  4.  Use CHG as you would any other liquid soap.  You can apply chg directly to the skin and wash.  Gently with a scrungie or clean washcloth.  5.  Apply the CHG Soap to your body ONLY FROM THE NECK DOWN.   Do   not use on face/ open                           Wound or open sores. Avoid contact with eyes, ears mouth and   genitals (private parts).                       Wash face,  Genitals (private parts) with your normal soap.             6.  Wash thoroughly, paying special attention to the area where your    surgery  will be performed.  7.  Thoroughly rinse your body with warm water from the neck down.  8.  DO NOT shower/wash  with your normal soap after using and rinsing off the CHG Soap.                9.  Pat yourself dry with a clean towel.            10.  Wear clean pajamas.            11.  Place clean sheets on your bed the night of your first shower and do not  sleep with pets. Day of Surgery : Do not apply any lotions/deodorants the morning of surgery.  Please wear clean clothes to the hospital/surgery center.  FAILURE TO FOLLOW THESE INSTRUCTIONS MAY RESULT IN THE CANCELLATION OF YOUR SURGERY  PATIENT SIGNATURE_________________________________  NURSE SIGNATURE__________________________________   ________________________________________________________________________ WHAT IS A BLOOD TRANSFUSION? Blood Transfusion Information  A transfusion is the replacement of blood or some of its parts. Blood is made up of multiple cells which provide different functions. Red blood cells carry oxygen and are used for blood loss replacement. White blood cells fight against infection. Platelets control bleeding. Plasma helps clot blood. Other blood products are available for specialized needs, such as hemophilia or other clotting disorders. BEFORE THE TRANSFUSION  Who gives blood for transfusions?  Healthy volunteers who are fully evaluated to make sure their blood is safe. This is blood bank blood. Transfusion therapy is the safest it has ever been in the practice of medicine. Before blood is taken from a donor, a complete history is taken to make sure that person has no history of diseases nor engages in risky social behavior (examples are intravenous drug use or sexual activity with multiple partners). The donor's travel history is screened to minimize risk of transmitting infections, such as malaria. The donated blood is tested for signs of infectious diseases, such as HIV and hepatitis. The blood is then tested to be sure it is compatible with you in order to minimize the chance of a transfusion reaction. If you or  a relative donates blood, this is often done in anticipation of surgery and is not appropriate for emergency situations. It takes many days to process the donated blood. RISKS AND COMPLICATIONS Although transfusion therapy is very safe and saves many lives, the main dangers of transfusion include:  Getting an infectious disease. Developing a transfusion reaction. This is an allergic reaction to something in the blood you were given. Every precaution is taken to prevent this. The decision to  have a blood transfusion has been considered carefully by your caregiver before blood is given. Blood is not given unless the benefits outweigh the risks. AFTER THE TRANSFUSION Right after receiving a blood transfusion, you will usually feel much better and more energetic. This is especially true if your red blood cells have gotten low (anemic). The transfusion raises the level of the red blood cells which carry oxygen, and this usually causes an energy increase. The nurse administering the transfusion will monitor you carefully for complications. HOME CARE INSTRUCTIONS  No special instructions are needed after a transfusion. You may find your energy is better. Speak with your caregiver about any limitations on activity for underlying diseases you may have. SEEK MEDICAL CARE IF:  Your condition is not improving after your transfusion. You develop redness or irritation at the intravenous (IV) site. SEEK IMMEDIATE MEDICAL CARE IF:  Any of the following symptoms occur over the next 12 hours: Shaking chills. You have a temperature by mouth above 102 F (38.9 C), not controlled by medicine. Chest, back, or muscle pain. People around you feel you are not acting correctly or are confused. Shortness of breath or difficulty breathing. Dizziness and fainting. You get a rash or develop hives. You have a decrease in urine output. Your urine turns a dark color or changes to pink, red, or brown. Any of the following  symptoms occur over the next 10 days: You have a temperature by mouth above 102 F (38.9 C), not controlled by medicine. Shortness of breath. Weakness after normal activity. The white part of the eye turns yellow (jaundice). You have a decrease in the amount of urine or are urinating less often. Your urine turns a dark color or changes to pink, red, or brown. Document Released: 11/11/2000 Document Revised: 02/06/2012 Document Reviewed: 06/30/2008   .Incentive Spirometer (Watch this video at home: [email or web address])  An incentive spirometer is a tool that can help keep your lungs clear and active. This tool measures how well you are filling your lungs with each breath. Taking long deep breaths may help reverse or decrease the chance of developing breathing (pulmonary) problems (especially infection) following: A long period of time when you are unable to move or be active. BEFORE THE PROCEDURE  If the spirometer includes an indicator to show your best effort, your nurse or respiratory therapist will set it to a desired goal. If possible, sit up straight or lean slightly forward. Try not to slouch. Hold the incentive spirometer in an upright position. INSTRUCTIONS FOR USE  Sit on the edge of your bed if possible, or sit up as far as you can in bed or on a chair. Hold the incentive spirometer in an upright position. Breathe out normally. Place the mouthpiece in your mouth and seal your lips tightly around it. Breathe in slowly and as deeply as possible, raising the piston or the ball toward the top of the column. Hold your breath for 3-5 seconds or for as long as possible. Allow the piston or ball to fall to the bottom of the column. Remove the mouthpiece from your mouth and breathe out normally. Rest for a few seconds and repeat Steps 1 through 7 at least 10 times every 1-2 hours when you are awake. Take your time and take a few normal breaths between deep  breaths. The spirometer may include an indicator to show your best effort. Use the indicator as a goal to work toward during each repetition. After each set of 10 deep  breaths, practice coughing to be sure your lungs are clear. If you have an incision (the cut made at the time of surgery), support your incision when coughing by placing a pillow or rolled up towels firmly against it. Once you are able to get out of bed, walk around indoors and cough well. You may stop using the incentive spirometer when instructed by your caregiver.  RISKS AND COMPLICATIONS Take your time so you do not get dizzy or light-headed. If you are in pain, you may need to take or ask for pain medication before doing incentive spirometry. It is harder to take a deep breath if you are having pain. AFTER USE Rest and breathe slowly and easily. It can be helpful to keep track of a log of your progress. Your caregiver can provide you with a simple table to help with this. If you are using the spirometer at home, follow these instructions: SEEK MEDICAL CARE IF:  You are having difficultly using the spirometer. You have trouble using the spirometer as often as instructed. Your pain medication is not giving enough relief while using the spirometer. You develop fever of 100.5 F (38.1 C) or higher. SEEK IMMEDIATE MEDICAL CARE IF:  You cough up bloody sputum that had not been present before. You develop fever of 102 F (38.9 C) or greater. You develop worsening pain at or near the incision site. MAKE SURE YOU:  Understand these instructions. Will watch your condition. Will get help right away if you are not doing well or get worse. Document Released: 03/27/2007 Document Revised: 02/06/2012 Document Reviewed: 05/28/2007 [facility] Patient Information 2014 [place], [place].

## 2023-08-28 NOTE — Progress Notes (Signed)
COVID Vaccine received:  []  No [x]  Yes Date of any COVID positive Test in last 90 days: no PCP - Ronnald Ramp MD Cardiologist - no  Chest x-ray - 03/30/23 Epic EKG -  03/02/23 Epic Stress Test - no ECHO - no Cardiac Cath - no  Bowel Prep - []  No  [x]   Yes ______  Pacemaker / ICD device [x]  No []  Yes   Spinal Cord Stimulator:[x]  No []  Yes       History of Sleep Apnea? [x]  No []  Yes   CPAP used?- [x]  No []  Yes    Does the patient monitor blood sugar?          []  No [x]  Yes  []  N/A  Patient has: []  NO Hx DM   []  Pre-DM                 []  DM1 [x]    DM2 Does patient have a [employer] or Dexacom? []  [x]  Yes   Fasting Blood Sugar Ranges- 120's Checks Blood Sugar _many____ times a day  GLP1 agonist / usual dose - no GLP1 instructions:  SGLT-2 inhibitors / usual dose - no SGLT-2 instructions:   Blood Thinner / Instructions:no Aspirin Instructions:no  Comments:   Activity level: Patient is able to climb a flight of stairs without difficulty; [x]  No CP  [x]  No SOB,   Patient can  perform ADLs without assistance.   Anesthesia review:   Patient denies shortness of breath, fever, cough and chest pain at PAT appointment.  Patient verbalized understanding and agreement to the Pre-Surgical Instructions that were given to them at this PAT appointment. Patient was also educated of the need to review these PAT instructions again prior to his/her surgery.I reviewed the appropriate phone numbers to call if they have any and questions or concerns.

## 2023-08-29 ENCOUNTER — Other Ambulatory Visit: Payer: Self-pay

## 2023-08-29 ENCOUNTER — Other Ambulatory Visit: Payer: Self-pay | Admitting: Genetic Counselor

## 2023-08-29 ENCOUNTER — Inpatient Hospital Stay: Payer: Medicare PPO | Attending: Genetic Counselor | Admitting: Genetic Counselor

## 2023-08-29 ENCOUNTER — Encounter (HOSPITAL_COMMUNITY): Payer: Self-pay

## 2023-08-29 ENCOUNTER — Encounter: Payer: Self-pay | Admitting: Genetic Counselor

## 2023-08-29 ENCOUNTER — Inpatient Hospital Stay: Payer: Medicare PPO

## 2023-08-29 ENCOUNTER — Encounter (HOSPITAL_COMMUNITY)
Admission: RE | Admit: 2023-08-29 | Discharge: 2023-08-29 | Disposition: A | Discharge status: Home / Self Care | Payer: Medicare PPO | Source: Ambulatory Visit | Attending: Surgery | Admitting: Surgery

## 2023-08-29 VITALS — BP 136/78 | HR 65 | Temp 97.9°F | Resp 16 | Ht 73.0 in | Wt 168.0 lb

## 2023-08-29 DIAGNOSIS — Z803 Family history of malignant neoplasm of breast: Secondary | ICD-10-CM | POA: Diagnosis not present

## 2023-08-29 DIAGNOSIS — C61 Malignant neoplasm of prostate: Secondary | ICD-10-CM

## 2023-08-29 DIAGNOSIS — Z800 Family history of malignant neoplasm of digestive organs: Secondary | ICD-10-CM | POA: Diagnosis not present

## 2023-08-29 DIAGNOSIS — E119 Type 2 diabetes mellitus without complications: Secondary | ICD-10-CM | POA: Insufficient documentation

## 2023-08-29 DIAGNOSIS — Z860101 Personal history of adenomatous and serrated colon polyps: Secondary | ICD-10-CM

## 2023-08-29 DIAGNOSIS — Z1379 Encounter for other screening for genetic and chromosomal anomalies: Secondary | ICD-10-CM

## 2023-08-29 DIAGNOSIS — Z01812 Encounter for preprocedural laboratory examination: Secondary | ICD-10-CM | POA: Diagnosis present

## 2023-08-29 DIAGNOSIS — Z01818 Encounter for other preprocedural examination: Secondary | ICD-10-CM

## 2023-08-29 DIAGNOSIS — Z8042 Family history of malignant neoplasm of prostate: Secondary | ICD-10-CM

## 2023-08-29 LAB — CBC
HCT: 36.4 % — ABNORMAL LOW (ref 39.0–52.0)
Hemoglobin: 11.9 g/dL — ABNORMAL LOW (ref 13.0–17.0)
MCH: 32 pg (ref 26.0–34.0)
MCHC: 32.7 g/dL (ref 30.0–36.0)
MCV: 97.8 fL (ref 80.0–100.0)
Platelets: 192 K/uL (ref 150–400)
RBC: 3.72 MIL/uL — ABNORMAL LOW (ref 4.22–5.81)
RDW: 12.7 % (ref 11.5–15.5)
WBC: 7 K/uL (ref 4.0–10.5)
nRBC: 0 % (ref 0.0–0.2)

## 2023-08-29 LAB — BASIC METABOLIC PANEL WITH GFR
Anion gap: 3 — ABNORMAL LOW (ref 5–15)
BUN: 21 mg/dL (ref 8–23)
CO2: 21 mmol/L — ABNORMAL LOW (ref 22–32)
Calcium: 9.2 mg/dL (ref 8.9–10.3)
Chloride: 113 mmol/L — ABNORMAL HIGH (ref 98–111)
Creatinine, Ser: 1.09 mg/dL (ref 0.61–1.24)
GFR, Estimated: >60 mL/min
Glucose, Bld: 109 mg/dL — ABNORMAL HIGH (ref 70–99)
Potassium: 4.6 mmol/L (ref 3.5–5.1)
Sodium: 137 mmol/L (ref 135–145)

## 2023-08-29 LAB — GENETIC SCREENING ORDER

## 2023-08-29 LAB — HEMOGLOBIN A1C
Hgb A1c MFr Bld: 6.3 % — ABNORMAL HIGH (ref 4.8–5.6)
Mean Plasma Glucose: 134.11 mg/dL

## 2023-08-29 NOTE — Progress Notes (Unsigned)
REFERRING PROVIDER: Andria Meuse, MD   PRIMARY PROVIDER:  Ronnald Ramp, MD  PRIMARY REASON FOR VISIT:  1. Family history of Lynch syndrome   2. Hx of adenomatous colonic polyps   3. Prostate cancer (HCC)   4. Family history of colon cancer   5. Family history of breast cancer   6. Family history of prostate cancer    HISTORY OF PRESENT ILLNESS:   Mr. Sao, a 72 y.o. male, was seen for a [facility] cancer genetics consultation at the request of Dr. Cliffton Asters due to a personal and family history of cancer.  Mr. Maclellan presents to clinic today to discuss the possibility of a hereditary predisposition to cancer, to discuss genetic testing, and to further clarify his future cancer risks, as well as potential cancer risks for family members.   Mr. Jeffus was diagnosed with prostate cancer at age 55 (Gleason: 7).   Past Medical History:  Diagnosis Date   Cancer [facility]) April 2024   Constipation 03/04/2023   Diabetes mellitus without complication [facility]) March 2024    Past Surgical History:  Procedure Laterality Date   APPENDECTOMY     BIOPSY  06/29/2023   Procedure: BIOPSY;  Surgeon: Lemar Lofty., MD;  Location: [facility];  Service: Gastroenterology;;   COLONOSCOPY WITH PROPOFOL N/A 04/12/2023   Procedure: COLONOSCOPY WITH PROPOFOL;  Surgeon: [name], MD;  Location: [facility] END[facility]COPY;  Service: Gastroenterology;  Laterality: N/A;   COLONOSCOPY WITH PROPOFOL N/A 06/29/2023   Procedure: COLONOSCOPY WITH PROPOFOL;  Surgeon: [name] Netty Starring., MD;  Location: [facility] ENDOSCOPY;  Service: Gastroenterology;  Laterality: N/A;   ENDOSCOPIC MUCOSAL RESECTION N/A 06/29/2023   Procedure: ENDOSCOPIC MUCOSAL RESECTION;  Surgeon: [name] Netty Starring., MD;  Location: [facility];  Service: Gastroenterology;  Laterality: N/A;   HEMOSTASIS CONTROL  06/29/2023   Procedure: HEMOSTASIS CONTROL;  Surgeon: Lemar Lofty., MD;  Location: [facility];  Service:  Gastroenterology;;   IR RADIOLOGIST EVAL & MGMT  03/16/2023   PELVIC ABCESS DRAINAGE     March 2024   POLYPECTOMY  06/29/2023   Procedure: POLYPECTOMY;  Surgeon: Mansouraty, Netty Starring., MD;  Location: [facility];  Service: Gastroenterology;;   Sunnie Nielsen LIFTING INJECTION  06/29/2023   Procedure: SUBMUCOSAL LIFTING INJECTION;  Surgeon: Lemar Lofty., MD;  Location: [facility];  Service: Gastroenterology;;   SUBMUCOSAL TATTOO INJECTION  06/29/2023   Procedure: SUBMUCOSAL TATTOO INJECTION;  Surgeon: Lemar Lofty., MD;  Location: [facility];  Service: Gastroenterology;;   TRANSURETHRAL RESECTION OF PROSTATE N/A 03/03/2023   Procedure: TRANSURETHRAL RESECTION OF THE PROSTATE (TURP)/ UNROOFING OF PROSTATE ABSCESS;  Surgeon: Riki Altes, MD;  Location: [facility] ORS;  Service: Urology;  Laterality: N/A;    Social History   Socioeconomic History   Marital status: Married    Spouse name: Editor, commissioning   Number of children: 1   Years of education: Not on file   Highest education level: Master's degree (e.g., MA, MS, MEng, MEd, MSW, MBA)  Occupational History   Not on file  Tobacco Use   Smoking status: Every Day    Current packs/day: 1.00    Average packs/day: 1 pack/day for 54.0 years (54.0 ttl pk-yrs)    Types: Cigarettes   Smokeless tobacco: Never   Tobacco comments:    Reports that at one time he smoked 3 packs per day while in the [employer]  Vaping Use   Vaping status: Every Day   Start date: 08/08/2022   Substances: Nicotine  Substance and  Sexual Activity   Alcohol use: Never   Drug use: Never   Sexual activity: Not Currently    Birth control/protection: None  Other Topics Concern   Not on file  Social History Narrative   ** Merged History Encounter **       Retired from teaching radiology for 30 years    Social Determinants of Health   Financial Resource Strain: Low Risk  (05/01/2023)   Overall Financial Resource Strain (CARDIA)    Difficulty of  Paying Living Expenses: Not hard at all  Food Insecurity: No Food Insecurity (05/01/2023)   Hunger Vital Sign    Worried About Running Out of Food in the Last Year: Never true    Ran Out of Food in the Last Year: Never true  Transportation Needs: No Transportation Needs (05/01/2023)   PRAPARE - Administrator, Civil Service (Medical): No    Lack of Transportation (Non-Medical): No  Physical Activity: Sufficiently Active (05/01/2023)   Exercise Vital Sign    Days of Exercise per Week: 6 days    Minutes of Exercise per Session: 150+ min  Stress: No Stress Concern Present (05/01/2023)   [employer] of Occupational Health - Occupational Stress Questionnaire    Feeling of Stress : Not at all  Social Connections: Moderately Integrated (05/01/2023)   Social Connection and Isolation Panel [NHANES]    Frequency of Communication with Friends and Family: Three times a week    Frequency of Social Gatherings with Friends and Family: Twice a week    Attends Religious Services: More than 4 times per year    Active Member of [employer] or Organizations: No    Attends Engineer, structural: Patient declined    Marital Status: Married     FAMILY HISTORY:  We obtained a detailed, 4-generation family history.  Significant diagnoses are listed below: Family History  Problem Relation Age of Onset   Atrial fibrillation Mother    Skin cancer Father        non-melanoma   Colon cancer Sister 56       Lynch Syndrome   Breast cancer Sister 73   Diabetes Brother    Throat cancer Maternal Uncle    Breast cancer Paternal Aunt 22 - 36   Colon cancer Maternal Grandmother 59   Alcohol abuse Maternal Grandfather    Breast cancer Paternal Grandmother 102   Prostate cancer Paternal Grandfather 71 - 41   Diabetes Other    Ovarian cancer Other 48       paternal first cousin once removed   Other Niece        2 nieces have Lynch Syndrome (brother's daughters)   Colon polyps Neg Hx    Stomach cancer  Neg Hx    Esophageal cancer Neg Hx    Inflammatory bowel disease Neg Hx    Liver disease Neg Hx    Pancreatic cancer Neg Hx     Mr. Monical***. There is no reported Ashkenazi Jewish ancestry.   GENETIC COUNSELING ASSESSMENT: Mr. Keena is a 72 y.o. male with a personal and family history of cancer along with a reported family history of Lynch Syndrome. We, therefore, discussed and recommended the following at today's visit.   DISCUSSION: We discussed that 5 - 10% of cancer/colon polyps is hereditary and there are many genes that can be associated with hereditary cancer/polyposis syndromes.  We discussed that testing is beneficial for several reasons, including knowing about other cancer risks, identifying potential screening and  risk-reduction options that may be appropriate, and to understanding if other family members could be at risk for cancer and allowing them to undergo genetic testing.  We reviewed the characteristics, features and inheritance patterns of hereditary cancer syndromes. We provided a general overview of Lynch Syndrome including cancer risks and management recommendations. We discussed Lynch Syndrome is not typically associated with polyposis. We also discussed genetic testing, including the appropriate family members to test, the process of testing, insurance coverage and turn-around-time for results. We discussed the implications of a negative, positive, carrier and/or variant of uncertain significant result.   Mr. Merfeld was offered a common hereditary cancer panel (34 genes) and an expanded pan-cancer panel (71 genes). Mr. Shaker was informed of the benefits and limitations of each panel, including that expanded pan-cancer panels contain several genes that do not have clear management guidelines at this point in time.  We also discussed that as the number of genes included on a panel increases, the chances of variants of uncertain significance increases.  After considering the  benefits and limitations of each gene panel, Mr. Auld elected to have [facility] CancerNext-Expanded Panel.  The CancerNext-Expanded gene panel offered by [facility] and includes sequencing, rearrangement, and RNA analysis for the following 71 genes: AIP, ALK, APC, ATM, AXIN2, BAP1, BARD1, BMPR1A, BRCA1, BRCA2, BRIP1, CDC73, CDH1, CDK4, CDKN1B, CDKN2A, CHEK2, CTNNA1, DICER1, FH, FLCN, KIF1B, LZTR1, MAX, MEN1, MET, MLH1, MSH2, MSH3, MSH6, MUTYH, NF1, NF2, NTHL1, PALB2, PHOX2B, PMS2, POT1, PRKAR1A, PTCH1, PTEN, RAD51C, RAD51D, RB1, RET, SDHA, SDHAF2, SDHB, SDHC, SDHD, SMAD4, SMARCA4, SMARCB1, SMARCE1, STK11, SUFU, TMEM127, TP53, TSC1, TSC2, and VHL (sequencing and deletion/duplication); EGFR, EGLN1, HOXB13, KIT, MITF, PDGFRA, POLD1, and POLE (sequencing only); EPCAM and GREM1 (deletion/duplication only).    Based on Mr. Skibinski's personal and family history of cancer, he meets medical criteria for genetic testing. Despite that he meets criteria, he may still have an out of pocket cost. We discussed that if his out of pocket cost for testing is over $100, the laboratory should contact them to discuss self-pay prices, patient pay assistance programs, if applicable, and other billing options.  We discussed that some people do not want to undergo genetic testing due to fear of genetic discrimination.  A federal law called the Genetic Information Non-Discrimination Act (GINA) of 2008 helps protect individuals against genetic discrimination based on their genetic test results.  It impacts both health insurance and employment.  With health insurance, it protects against increased premiums, being kicked off insurance or being forced to take a test in order to be insured.  For employment it protects against hiring, firing and promoting decisions based on genetic test results.  GINA does not apply to those in the [employer], those who work for companies with less than 15 employees, and new life insurance or long-term  disability insurance policies.  Health status due to a cancer diagnosis is not protected under GINA.  PLAN: After considering the risks, benefits, and limitations, Mr. Westervelt provided informed consent to pursue genetic testing and the blood sample was sent to [facility] for analysis of the CancerNext-Expanded Panel. Results should be available within approximately 2-3 weeks' time, at which point they will be disclosed by telephone to Mr. Bernier, as will any additional recommendations warranted by these results. Mr. Rape will receive a summary of his genetic counseling visit and a copy of his results once available. This information will also be available in Epic.   Mr. Werntz questions were answered to his satisfaction  today. Our contact information was provided should additional questions or concerns arise. Thank you for the referral and allowing [place] to share in the care of your patient.   Lalla Brothers, MS, [facility] Genetic Counselor [email or web address] (P) [phone]  The patient was seen for a total of 30 minutes in face-to-face genetic counseling. The patient was seen alone.  Drs. Pamelia Hoit and/or Mosetta Putt were available to discuss this case as needed.  _______________________________________________________________________ For Office Staff:  Number of people involved in session: 1 Was an Intern/ student involved with case: no

## 2023-08-31 NOTE — Progress Notes (Signed)
Established patient visit   Patient: Colin Griffin   DOB: October 15, 1951   72 y.o. Male  MRN: [id number] Visit Date: 09/01/2023  Today's healthcare provider: Ronnald Ramp, MD   Chief Complaint  Patient presents with   Follow-up    5 mo f/u upcoming surgery Monday    Subjective     HPI     Follow-up    Additional comments: 5 mo f/u upcoming surgery Monday       Last edited by Clois Comber on 09/01/2023  3:35 PM.       Discussed the use of AI scribe software for clinical note transcription with the patient, who gave verbal consent to proceed.  History of Present Illness   The patient, with a history of prostate cancer and diabetes, presents for a follow-up consultation. He recently completed a course of 40 radiation treatments for prostate cancer and is scheduled for a partial colectomy due to a suspicious polyp found during a colonoscopy. The patient has a history of multiple polyps, with eight removed during the first colonoscopy and nine during the second. All were benign except for the ninth one from the second colonoscopy, which was deemed suspicious.  The patient's diabetes is well-controlled, with a recent A1c of 6.3. He is on a regimen of Metformin 500mg  twice daily and Trulicity 0.75mg  once weekly. He also uses a [employer] device for glucose monitoring. The patient reports that his blood sugar levels tend to drop overnight and rise after meals, with an average daily blood sugar level of 118. He has noticed that certain foods, such as tomato soup, cause his blood sugar to spike over 200.  The patient's blood pressure at the time of the consultation was 121/82, which he reports is typical for him. He is not currently on any other medications, except for two antibiotics he was prescribed to take before his upcoming surgery. The patient reports feeling generally well, with no new or worsening symptoms since his last consultation.         Past  Medical History:  Diagnosis Date   Cancer [facility]) April 2024   Constipation 03/04/2023   Diabetes mellitus without complication [facility]) March 2024    Medications: Outpatient Medications Prior to Visit  Medication Sig   Dulaglutide (TRULICITY) 0.75 MG/0.5ML SOPN Inject 0.75 mg into the skin once a week.   metFORMIN (GLUCOPHAGE-XR) 500 MG 24 hr tablet Take 1 tablet (500 mg total) by mouth 2 (two) times daily with a meal.   Continuous Blood Gluc Sensor (FREESTYLE LIBRE 3 SENSOR) MISC 2 Devices by Does not apply route daily. Place 1 sensor on the skin every 14 days. Use to check glucose continuously (Patient not taking: Reported on 09/01/2023)   Continuous Glucose Receiver (FREESTYLE LIBRE 3 READER) DEVI 2 Devices by Does not apply route daily. Use to monitor sensor glucose reading daily (Patient not taking: Reported on 09/01/2023)   metroNIDAZOLE (FLAGYL) 500 MG tablet PLEASE SEE ATTACHED FOR DETAILED DIRECTIONS (Patient not taking: Reported on 09/01/2023)   neomycin (MYCIFRADIN) 500 MG tablet PLEASE SEE ATTACHED FOR DETAILED DIRECTIONS (Patient not taking: Reported on 09/01/2023)   No facility-administered medications prior to visit.    Review of Systems  Last metabolic panel Lab Results  Component Value Date   GLUCOSE 109 (H) 08/29/2023   NA 137 08/29/2023   K 4.6 08/29/2023   CL 113 (H) 08/29/2023   CO2 21 (L) 08/29/2023   BUN 21 08/29/2023   CREATININE  1.09 08/29/2023   GFRNONAA >60 08/29/2023   CALCIUM 9.2 08/29/2023   PROT 6.9 07/06/2023   ALBUMIN 3.9 07/06/2023   LABGLOB 3.0 02/14/2023   AGRATIO 1.4 02/14/2023   BILITOT 0.2 (L) 07/06/2023   ALKPHOS 56 07/06/2023   AST 17 07/06/2023   ALT 21 07/06/2023   ANIONGAP 3 (L) 08/29/2023   Last hemoglobin A1c Lab Results  Component Value Date   HGBA1C 6.3 (H) 08/29/2023        Objective    BP 121/82 (BP Location: Left Arm, Patient Position: Sitting, Cuff Size: Normal)   Pulse 80   Temp 98.3 F (36.8 C)   Ht 6\' 1"  (1.854 m)    Wt 169 lb 9.6 oz (76.9 kg)   SpO2 100%   BMI 22.38 kg/m    BP Readings from Last 3 Encounters:  09/01/23 121/82  08/29/23 136/78  08/21/23 (!) 143/76   Wt Readings from Last 3 Encounters:  09/01/23 169 lb 9.6 oz (76.9 kg)  08/29/23 168 lb (76.2 kg)  08/21/23 173 lb 14.4 oz (78.9 kg)       Physical Exam Cardiovascular:     Rate and Rhythm: Normal rate and regular rhythm.  Pulmonary:     Effort: Pulmonary effort is normal.     Breath sounds: Examination of the left-middle field reveals wheezing. Examination of the right-lower field reveals wheezing. Examination of the left-lower field reveals wheezing and rales. Wheezing and rales present. No rhonchi.  Musculoskeletal:     Right lower leg: No edema.     Left lower leg: No edema.       No results found for any visits on 09/01/23.  Assessment & Plan     Problem List Items Addressed This Visit     Type 2 diabetes mellitus with hyperglycemia, without long-term current use of insulin (HCC) - Primary    Well-controlled with A1C of 6.3. Patient is on Metformin 500mg  twice daily and Trulicity 0.75mg  once weekly. Noted hyperglycemic response to certain foods (tomato soup). -Continue current medication regimen. -Advise patient to monitor blood glucose levels closely, especially after meals. -Encourage patient to maintain a balanced diet. -Continue using Freestyle Libre for blood glucose monitoring.           Colon Polyps Multiple large polyps removed during two colonoscopies, with one suspicious for malignancy. Patient scheduled for partial colectomy. -Continue current management plan and follow up post-surgery.    Prostate Cancer Completed radiation therapy with 40 treatments. PSA levels decreased from 8 to 0.73 during treatment. -Continue current management plan and monitor PSA levels, plan per oncology/urology  General Health Maintenance  -Postpone Trulicity administration due to upcoming surgery. -Follow up  on CT scan for nodule. -Schedule next appointment in six months (April 2025), or sooner if needed.         Return in about 6 months (around 03/01/2024) for CHRONIC F/U.         Ronnald Ramp, MD  [facility] [phone] (phone) [phone] (fax)  [facility]

## 2023-09-01 ENCOUNTER — Encounter: Payer: Self-pay | Admitting: Family Medicine

## 2023-09-01 ENCOUNTER — Ambulatory Visit (INDEPENDENT_AMBULATORY_CARE_PROVIDER_SITE_OTHER): Payer: Medicare PPO | Admitting: Family Medicine

## 2023-09-01 ENCOUNTER — Encounter (HOSPITAL_COMMUNITY): Payer: Self-pay | Admitting: Physician Assistant

## 2023-09-01 VITALS — BP 121/82 | HR 80 | Temp 98.3°F | Ht 73.0 in | Wt 169.6 lb

## 2023-09-01 DIAGNOSIS — C61 Malignant neoplasm of prostate: Secondary | ICD-10-CM

## 2023-09-01 DIAGNOSIS — Z7984 Long term (current) use of oral hypoglycemic drugs: Secondary | ICD-10-CM

## 2023-09-01 DIAGNOSIS — E1165 Type 2 diabetes mellitus with hyperglycemia: Secondary | ICD-10-CM | POA: Diagnosis not present

## 2023-09-01 NOTE — Assessment & Plan Note (Addendum)
Well-controlled with A1C of 6.3. Patient is on Metformin 500mg  twice daily and Trulicity 0.75mg  once weekly. Noted hyperglycemic response to certain foods (tomato soup). -Continue current medication regimen. -Advise patient to monitor blood glucose levels closely, especially after meals. -Encourage patient to maintain a balanced diet. -Continue using Freestyle Libre for blood glucose monitoring.

## 2023-09-04 ENCOUNTER — Encounter (HOSPITAL_COMMUNITY)
Admission: RE | Disposition: A | Discharge status: Home / Self Care | Payer: Self-pay | Source: Home / Self Care | Attending: Surgery

## 2023-09-04 ENCOUNTER — Inpatient Hospital Stay (HOSPITAL_COMMUNITY): Payer: Medicare PPO | Admitting: Anesthesiology

## 2023-09-04 ENCOUNTER — Other Ambulatory Visit: Payer: Self-pay

## 2023-09-04 ENCOUNTER — Inpatient Hospital Stay (HOSPITAL_COMMUNITY)
Admission: RE | Admit: 2023-09-04 | Discharge: 2023-09-06 | DRG: 330 | Disposition: A | Discharge status: Home / Self Care | Payer: Medicare PPO | Attending: Surgery | Admitting: Surgery

## 2023-09-04 ENCOUNTER — Encounter (HOSPITAL_COMMUNITY): Payer: Self-pay | Admitting: Surgery

## 2023-09-04 DIAGNOSIS — Z9049 Acquired absence of other specified parts of digestive tract: Principal | ICD-10-CM

## 2023-09-04 DIAGNOSIS — E119 Type 2 diabetes mellitus without complications: Secondary | ICD-10-CM | POA: Diagnosis present

## 2023-09-04 DIAGNOSIS — Z800 Family history of malignant neoplasm of digestive organs: Secondary | ICD-10-CM | POA: Diagnosis not present

## 2023-09-04 DIAGNOSIS — Z8546 Personal history of malignant neoplasm of prostate: Secondary | ICD-10-CM | POA: Diagnosis not present

## 2023-09-04 DIAGNOSIS — D509 Iron deficiency anemia, unspecified: Secondary | ICD-10-CM | POA: Diagnosis present

## 2023-09-04 DIAGNOSIS — Z860101 Personal history of adenomatous and serrated colon polyps: Secondary | ICD-10-CM

## 2023-09-04 DIAGNOSIS — C772 Secondary and unspecified malignant neoplasm of intra-abdominal lymph nodes: Secondary | ICD-10-CM | POA: Diagnosis present

## 2023-09-04 DIAGNOSIS — F17210 Nicotine dependence, cigarettes, uncomplicated: Secondary | ICD-10-CM | POA: Diagnosis present

## 2023-09-04 DIAGNOSIS — Z9079 Acquired absence of other genital organ(s): Secondary | ICD-10-CM

## 2023-09-04 DIAGNOSIS — K6389 Other specified diseases of intestine: Secondary | ICD-10-CM | POA: Diagnosis not present

## 2023-09-04 DIAGNOSIS — D374 Neoplasm of uncertain behavior of colon: Secondary | ICD-10-CM | POA: Diagnosis present

## 2023-09-04 DIAGNOSIS — C186 Malignant neoplasm of descending colon: Secondary | ICD-10-CM | POA: Diagnosis present

## 2023-09-04 DIAGNOSIS — Z833 Family history of diabetes mellitus: Secondary | ICD-10-CM

## 2023-09-04 DIAGNOSIS — Z803 Family history of malignant neoplasm of breast: Secondary | ICD-10-CM

## 2023-09-04 DIAGNOSIS — J439 Emphysema, unspecified: Secondary | ICD-10-CM | POA: Diagnosis present

## 2023-09-04 DIAGNOSIS — Z808 Family history of malignant neoplasm of other organs or systems: Secondary | ICD-10-CM

## 2023-09-04 DIAGNOSIS — Z8042 Family history of malignant neoplasm of prostate: Secondary | ICD-10-CM

## 2023-09-04 HISTORY — PX: FLEXIBLE SIGMOIDOSCOPY: SHX5431

## 2023-09-04 LAB — TYPE AND SCREEN
ABO/RH(D): A NEG
Antibody Screen: NEGATIVE

## 2023-09-04 LAB — CBC
HCT: 36 % — ABNORMAL LOW (ref 39.0–52.0)
Hemoglobin: 12.1 g/dL — ABNORMAL LOW (ref 13.0–17.0)
MCH: 33.2 pg (ref 26.0–34.0)
MCHC: 33.6 g/dL (ref 30.0–36.0)
MCV: 98.9 fL (ref 80.0–100.0)
Platelets: 217 10*3/uL (ref 150–400)
RBC: 3.64 MIL/uL — ABNORMAL LOW (ref 4.22–5.81)
RDW: 12.6 % (ref 11.5–15.5)
WBC: 9.4 10*3/uL (ref 4.0–10.5)
nRBC: 0 % (ref 0.0–0.2)

## 2023-09-04 LAB — CREATININE, SERUM
Creatinine, Ser: 1.21 mg/dL (ref 0.61–1.24)
GFR, Estimated: >60 mL/min (ref >60)

## 2023-09-04 LAB — ABO/RH: ABO/RH(D): A NEG

## 2023-09-04 LAB — GLUCOSE, CAPILLARY
Glucose-Capillary: 127 mg/dL — ABNORMAL HIGH (ref 70–99)
Glucose-Capillary: 185 mg/dL — ABNORMAL HIGH (ref 70–99)

## 2023-09-04 SURGERY — COLECTOMY, SIGMOID, ROBOT-ASSISTED
Anesthesia: General

## 2023-09-04 MED ORDER — LACTATED RINGERS IR SOLN
Status: DC | PRN
Start: 2023-09-04 — End: 2023-09-04
  Administered 2023-09-04: 1000 mL

## 2023-09-04 MED ORDER — ROCURONIUM BROMIDE 10 MG/ML (PF) SYRINGE
PREFILLED_SYRINGE | INTRAVENOUS | Status: AC
  Filled 2023-09-04: qty 10

## 2023-09-04 MED ORDER — BUPIVACAINE LIPOSOME 1.3 % IJ SUSP
INTRAMUSCULAR | Status: AC
  Filled 2023-09-04: qty 20

## 2023-09-04 MED ORDER — CHLORHEXIDINE GLUCONATE CLOTH 2 % EX PADS: 6.0000 | MEDICATED_PAD | Freq: Once | CUTANEOUS | Status: DC

## 2023-09-04 MED ORDER — TRAMADOL HCL 50 MG PO TABS
50.0000 mg | ORAL_TABLET | Freq: Four times a day (QID) | ORAL | Status: DC | PRN
  Administered 2023-09-05 – 2023-09-06 (×3): 50 mg via ORAL
  Filled 2023-09-04 (×3): qty 1

## 2023-09-04 MED ORDER — IBUPROFEN 400 MG PO TABS: 600.0000 mg | ORAL_TABLET | Freq: Four times a day (QID) | ORAL | Status: DC | PRN

## 2023-09-04 MED ORDER — PHENYLEPHRINE HCL (PRESSORS) 10 MG/ML IV SOLN
INTRAVENOUS | Status: DC | PRN
Start: 2023-09-04 — End: 2023-09-04
  Administered 2023-09-04 (×3): 80 ug via INTRAVENOUS
  Administered 2023-09-04: 160 ug via INTRAVENOUS

## 2023-09-04 MED ORDER — ONDANSETRON HCL 4 MG PO TABS: 4.0000 mg | ORAL_TABLET | Freq: Four times a day (QID) | ORAL | Status: DC | PRN

## 2023-09-04 MED ORDER — METRONIDAZOLE 500 MG PO TABS: 1000.0000 mg | ORAL_TABLET | ORAL | Status: DC

## 2023-09-04 MED ORDER — ALUM & MAG HYDROXIDE-SIMETH 200-200-20 MG/5ML PO SUSP: 30.0000 mL | Freq: Four times a day (QID) | ORAL | Status: DC | PRN

## 2023-09-04 MED ORDER — ENSURE PRE-SURGERY PO LIQD
592.0000 mL | Freq: Once | ORAL | Status: DC
  Filled 2023-09-04: qty 592

## 2023-09-04 MED ORDER — ENSURE SURGERY PO LIQD
237.0000 mL | Freq: Two times a day (BID) | ORAL | Status: DC
  Administered 2023-09-06: 237 mL via ORAL

## 2023-09-04 MED ORDER — AMISULPRIDE (ANTIEMETIC) 5 MG/2ML IV SOLN: 10.0000 mg | Freq: Once | INTRAVENOUS | Status: DC | PRN

## 2023-09-04 MED ORDER — SUGAMMADEX SODIUM 200 MG/2ML IV SOLN
INTRAVENOUS | Status: DC | PRN
Start: 2023-09-04 — End: 2023-09-04
  Administered 2023-09-04: 200 mg via INTRAVENOUS

## 2023-09-04 MED ORDER — LACTATED RINGERS IV SOLN: INTRAVENOUS | Status: DC

## 2023-09-04 MED ORDER — OXYCODONE HCL 5 MG/5ML PO SOLN: 5.0000 mg | Freq: Once | ORAL | Status: DC | PRN

## 2023-09-04 MED ORDER — ALVIMOPAN 12 MG PO CAPS
12.0000 mg | ORAL_CAPSULE | Freq: Two times a day (BID) | ORAL | Status: DC
  Administered 2023-09-05 (×2): 12 mg via ORAL
  Filled 2023-09-04 (×3): qty 1

## 2023-09-04 MED ORDER — BUPIVACAINE-EPINEPHRINE 0.25% -1:200000 IJ SOLN
INTRAMUSCULAR | Status: AC
  Filled 2023-09-04: qty 1

## 2023-09-04 MED ORDER — KETAMINE HCL 50 MG/5ML IJ SOSY
PREFILLED_SYRINGE | INTRAMUSCULAR | Status: AC
  Filled 2023-09-04: qty 5

## 2023-09-04 MED ORDER — DIPHENHYDRAMINE HCL 12.5 MG/5ML PO ELIX: 12.5000 mg | ORAL_SOLUTION | Freq: Four times a day (QID) | ORAL | Status: DC | PRN

## 2023-09-04 MED ORDER — ONDANSETRON HCL 4 MG/2ML IJ SOLN: 4.0000 mg | Freq: Once | INTRAMUSCULAR | Status: DC | PRN

## 2023-09-04 MED ORDER — CHLORHEXIDINE GLUCONATE 0.12 % MT SOLN
15.0000 mL | Freq: Once | OROMUCOSAL | Status: AC
  Administered 2023-09-04: 15 mL via OROMUCOSAL

## 2023-09-04 MED ORDER — MIDAZOLAM HCL 5 MG/5ML IJ SOLN
INTRAMUSCULAR | Status: DC | PRN
  Administered 2023-09-04: 2 mg via INTRAVENOUS

## 2023-09-04 MED ORDER — ALVIMOPAN 12 MG PO CAPS
12.0000 mg | ORAL_CAPSULE | ORAL | Status: AC
  Administered 2023-09-04: 12 mg via ORAL
  Filled 2023-09-04: qty 1

## 2023-09-04 MED ORDER — ACETAMINOPHEN 500 MG PO TABS: 1000.0000 mg | ORAL_TABLET | Freq: Once | ORAL | Status: DC

## 2023-09-04 MED ORDER — DEXAMETHASONE SODIUM PHOSPHATE 10 MG/ML IJ SOLN
INTRAMUSCULAR | Status: AC
  Filled 2023-09-04: qty 1

## 2023-09-04 MED ORDER — LIDOCAINE HCL (CARDIAC) PF 100 MG/5ML IV SOSY
PREFILLED_SYRINGE | INTRAVENOUS | Status: DC | PRN
  Administered 2023-09-04: 70 mg via INTRAVENOUS

## 2023-09-04 MED ORDER — MIDAZOLAM HCL 2 MG/2ML IJ SOLN
INTRAMUSCULAR | Status: AC
  Filled 2023-09-04: qty 2

## 2023-09-04 MED ORDER — LIDOCAINE HCL (PF) 2 % IJ SOLN
INTRAMUSCULAR | Status: AC
  Filled 2023-09-04: qty 5

## 2023-09-04 MED ORDER — ACETAMINOPHEN 500 MG PO TABS
1000.0000 mg | ORAL_TABLET | ORAL | Status: AC
  Administered 2023-09-04: 1000 mg via ORAL
  Filled 2023-09-04: qty 2

## 2023-09-04 MED ORDER — HEPARIN SODIUM (PORCINE) 5000 UNIT/ML IJ SOLN
5000.0000 [IU] | Freq: Three times a day (TID) | INTRAMUSCULAR | Status: DC
  Administered 2023-09-04 – 2023-09-06 (×5): 5000 [IU] via SUBCUTANEOUS
  Filled 2023-09-04 (×5): qty 1

## 2023-09-04 MED ORDER — FENTANYL CITRATE (PF) 100 MCG/2ML IJ SOLN
INTRAMUSCULAR | Status: AC
  Filled 2023-09-04: qty 2

## 2023-09-04 MED ORDER — BUPIVACAINE LIPOSOME 1.3 % IJ SUSP
INTRAMUSCULAR | Status: DC | PRN
  Administered 2023-09-04: 20 mL

## 2023-09-04 MED ORDER — KETAMINE HCL 10 MG/ML IJ SOLN
INTRAMUSCULAR | Status: DC | PRN
Start: 2023-09-04 — End: 2023-09-04
  Administered 2023-09-04: 10 mg via INTRAVENOUS
  Administered 2023-09-04: 30 mg via INTRAVENOUS
  Administered 2023-09-04: 10 mg via INTRAVENOUS

## 2023-09-04 MED ORDER — ONDANSETRON HCL 4 MG/2ML IJ SOLN
INTRAMUSCULAR | Status: DC | PRN
  Administered 2023-09-04: 4 mg via INTRAVENOUS

## 2023-09-04 MED ORDER — ACETAMINOPHEN 500 MG PO TABS
1000.0000 mg | ORAL_TABLET | Freq: Four times a day (QID) | ORAL | Status: DC
  Administered 2023-09-04 – 2023-09-05 (×3): 1000 mg via ORAL
  Filled 2023-09-04 (×7): qty 2

## 2023-09-04 MED ORDER — BISACODYL 5 MG PO TBEC: 20.0000 mg | DELAYED_RELEASE_TABLET | Freq: Once | ORAL | Status: DC

## 2023-09-04 MED ORDER — NEOMYCIN SULFATE 500 MG PO TABS: 1000.0000 mg | ORAL_TABLET | ORAL | Status: DC

## 2023-09-04 MED ORDER — DEXAMETHASONE SODIUM PHOSPHATE 4 MG/ML IJ SOLN
INTRAMUSCULAR | Status: DC | PRN
Start: 2023-09-04 — End: 2023-09-04
  Administered 2023-09-04: 10 mg via INTRAVENOUS

## 2023-09-04 MED ORDER — SODIUM CHLORIDE 0.9 % IV SOLN
2.0000 g | INTRAVENOUS | Status: AC
  Administered 2023-09-04: 2 g via INTRAVENOUS
  Filled 2023-09-04: qty 2

## 2023-09-04 MED ORDER — HYDROMORPHONE HCL 1 MG/ML IJ SOLN
INTRAMUSCULAR | Status: DC | PRN
Start: 2023-09-04 — End: 2023-09-04
  Administered 2023-09-04 (×2): 1 mg via INTRAVENOUS

## 2023-09-04 MED ORDER — HYDROMORPHONE HCL 2 MG/ML IJ SOLN
INTRAMUSCULAR | Status: AC
  Filled 2023-09-04: qty 1

## 2023-09-04 MED ORDER — ENSURE PRE-SURGERY PO LIQD: 296.0000 mL | Freq: Once | ORAL | Status: DC

## 2023-09-04 MED ORDER — HYDRALAZINE HCL 20 MG/ML IJ SOLN: 10.0000 mg | INTRAMUSCULAR | Status: DC | PRN

## 2023-09-04 MED ORDER — PROPOFOL 10 MG/ML IV BOLUS
INTRAVENOUS | Status: AC
  Filled 2023-09-04: qty 20

## 2023-09-04 MED ORDER — BUPIVACAINE-EPINEPHRINE (PF) 0.25% -1:200000 IJ SOLN
INTRAMUSCULAR | Status: DC | PRN
  Administered 2023-09-04: 30 mL via PERINEURAL

## 2023-09-04 MED ORDER — HYDROMORPHONE HCL 1 MG/ML IJ SOLN: 0.2500 mg | INTRAMUSCULAR | Status: DC | PRN

## 2023-09-04 MED ORDER — BUPIVACAINE LIPOSOME 1.3 % IJ SUSP: 20.0000 mL | Freq: Once | INTRAMUSCULAR | Status: DC

## 2023-09-04 MED ORDER — 0.9 % SODIUM CHLORIDE (POUR BTL) OPTIME
TOPICAL | Status: DC | PRN
Start: 2023-09-04 — End: 2023-09-04
  Administered 2023-09-04: 1000 mL

## 2023-09-04 MED ORDER — ORAL CARE MOUTH RINSE: 15.0000 mL | Freq: Once | OROMUCOSAL | Status: AC

## 2023-09-04 MED ORDER — OXYCODONE HCL 5 MG PO TABS: 5.0000 mg | ORAL_TABLET | Freq: Once | ORAL | Status: DC | PRN

## 2023-09-04 MED ORDER — ROCURONIUM BROMIDE 100 MG/10ML IV SOLN
INTRAVENOUS | Status: DC | PRN
  Administered 2023-09-04: 70 mg via INTRAVENOUS
  Administered 2023-09-04: 30 mg via INTRAVENOUS

## 2023-09-04 MED ORDER — EPHEDRINE SULFATE (PRESSORS) 50 MG/ML IJ SOLN
INTRAMUSCULAR | Status: DC | PRN
Start: 2023-09-04 — End: 2023-09-04
  Administered 2023-09-04: 10 mg via INTRAVENOUS

## 2023-09-04 MED ORDER — HYDROMORPHONE HCL 1 MG/ML IJ SOLN
0.5000 mg | INTRAMUSCULAR | Status: DC | PRN
  Administered 2023-09-04 (×2): 0.5 mg via INTRAVENOUS
  Filled 2023-09-04 (×2): qty 0.5

## 2023-09-04 MED ORDER — DIPHENHYDRAMINE HCL 50 MG/ML IJ SOLN: 12.5000 mg | Freq: Four times a day (QID) | INTRAMUSCULAR | Status: DC | PRN

## 2023-09-04 MED ORDER — FENTANYL CITRATE (PF) 100 MCG/2ML IJ SOLN
INTRAMUSCULAR | Status: DC | PRN
  Administered 2023-09-04 (×4): 50 ug via INTRAVENOUS

## 2023-09-04 MED ORDER — ONDANSETRON HCL 4 MG/2ML IJ SOLN: 4.0000 mg | Freq: Four times a day (QID) | INTRAMUSCULAR | Status: DC | PRN

## 2023-09-04 MED ORDER — HEPARIN SODIUM (PORCINE) 5000 UNIT/ML IJ SOLN
5000.0000 [IU] | Freq: Once | INTRAMUSCULAR | Status: AC
  Administered 2023-09-04: 5000 [IU] via SUBCUTANEOUS
  Filled 2023-09-04: qty 1

## 2023-09-04 MED ORDER — SIMETHICONE 80 MG PO CHEW: 40.0000 mg | CHEWABLE_TABLET | Freq: Four times a day (QID) | ORAL | Status: DC | PRN

## 2023-09-04 MED ORDER — ONDANSETRON HCL 4 MG/2ML IJ SOLN
INTRAMUSCULAR | Status: AC
  Filled 2023-09-04: qty 2

## 2023-09-04 MED ORDER — PROPOFOL 10 MG/ML IV BOLUS
INTRAVENOUS | Status: DC | PRN
  Administered 2023-09-04: 150 mg via INTRAVENOUS

## 2023-09-04 MED ORDER — POLYETHYLENE GLYCOL 3350 17 GM/SCOOP PO POWD: 1.0000 | Freq: Once | ORAL | Status: DC

## 2023-09-04 SURGICAL SUPPLY — 114 items
ADH SKN CLS APL DERMABOND .7 (GAUZE/BANDAGES/DRESSINGS) ×2
APL PRP STRL LF DISP 70% ISPRP (MISCELLANEOUS) ×1
APPLIER CLIP 5 13 M/L LIGAMAX5 (MISCELLANEOUS)
APPLIER CLIP ROT 10 11.4 M/L (STAPLE)
APR CLP MED LRG 11.4X10 (STAPLE)
APR CLP MED LRG 5 ANG JAW (MISCELLANEOUS)
BAG COUNTER SPONGE SURGICOUNT (BAG) IMPLANT
BAG SPNG CNTER NS LX DISP (BAG)
BLADE EXTENDED COATED 6.5IN (ELECTRODE) ×1 IMPLANT
CANNULA REDUCER 12-8 DVNC XI (CANNULA) ×1 IMPLANT
CHLORAPREP W/TINT 26 (MISCELLANEOUS) ×1 IMPLANT
CLIP APPLIE 5 13 M/L LIGAMAX5 (MISCELLANEOUS) IMPLANT
CLIP APPLIE ROT 10 11.4 M/L (STAPLE) IMPLANT
CLIP LIGATING HEM O LOK PURPLE (MISCELLANEOUS) IMPLANT
CLIP LIGATING HEMO O LOK GREEN (MISCELLANEOUS) IMPLANT
COVER SURGICAL LIGHT HANDLE (MISCELLANEOUS) ×2 IMPLANT
COVER TIP SHEARS 8 DVNC (MISCELLANEOUS) ×1 IMPLANT
DEFOGGER SCOPE WARMER CLEARIFY (MISCELLANEOUS) ×1 IMPLANT
DERMABOND ADVANCED .7 DNX12 (GAUZE/BANDAGES/DRESSINGS) IMPLANT
DEVICE TROCAR PUNCTURE CLOSURE (ENDOMECHANICALS) IMPLANT
DRAIN CHANNEL 19F RND (DRAIN) ×1 IMPLANT
DRAPE ARM DVNC X/XI (DISPOSABLE) ×4 IMPLANT
DRAPE COLUMN DVNC XI (DISPOSABLE) ×1 IMPLANT
DRAPE SURG IRRIG POUCH 19X23 (DRAPES) ×1 IMPLANT
DRIVER NDL LRG 8 DVNC XI (INSTRUMENTS) ×1 IMPLANT
DRIVER NDLE LRG 8 DVNC XI (INSTRUMENTS) ×1
DRSG OPSITE POSTOP 4X10 (GAUZE/BANDAGES/DRESSINGS) IMPLANT
DRSG OPSITE POSTOP 4X6 (GAUZE/BANDAGES/DRESSINGS) IMPLANT
DRSG OPSITE POSTOP 4X8 (GAUZE/BANDAGES/DRESSINGS) IMPLANT
DRSG TEGADERM 2-3/8X2-3/4 SM (GAUZE/BANDAGES/DRESSINGS) ×5 IMPLANT
DRSG TEGADERM 4X4.75 (GAUZE/BANDAGES/DRESSINGS) ×1 IMPLANT
ELECT REM PT RETURN 15FT ADLT (MISCELLANEOUS) ×1 IMPLANT
ENDOLOOP SUT PDS II 0 18 (SUTURE) IMPLANT
EVACUATOR SILICONE 100CC (DRAIN) ×1 IMPLANT
GAUZE SPONGE 2X2 8PLY STRL LF (GAUZE/BANDAGES/DRESSINGS) ×1 IMPLANT
GAUZE SPONGE 4X4 12PLY STRL (GAUZE/BANDAGES/DRESSINGS) IMPLANT
GLOVE BIO SURGEON STRL SZ7.5 (GLOVE) ×3 IMPLANT
GLOVE INDICATOR 8.0 STRL GRN (GLOVE) ×3 IMPLANT
GOWN SRG XL LVL 4 BRTHBL STRL (GOWNS) ×1 IMPLANT
GOWN STRL NON-REIN XL LVL4 (GOWNS) ×1
GOWN STRL REUS W/ TWL XL LVL3 (GOWN DISPOSABLE) ×5 IMPLANT
GOWN STRL REUS W/TWL XL LVL3 (GOWN DISPOSABLE) ×5
GRASPER SUT TROCAR 14GX15 (MISCELLANEOUS) IMPLANT
GRASPER TIP-UP FEN DVNC XI (INSTRUMENTS) ×1 IMPLANT
HOLDER FOLEY CATH W/STRAP (MISCELLANEOUS) ×1 IMPLANT
IRRIG SUCT STRYKERFLOW 2 WTIP (MISCELLANEOUS) ×1
IRRIGATION SUCT STRKRFLW 2 WTP (MISCELLANEOUS) ×1 IMPLANT
KIT PROCEDURE DVNC SI (MISCELLANEOUS) IMPLANT
KIT TURNOVER KIT A (KITS) IMPLANT
LIGASURE IMPACT 36 18CM CVD LR (INSTRUMENTS) IMPLANT
NDL INSUFFLATION 14GA 120MM (NEEDLE) ×1 IMPLANT
NEEDLE INSUFFLATION 14GA 120MM (NEEDLE) ×1
PACK CARDIOVASCULAR III (CUSTOM PROCEDURE TRAY) ×1 IMPLANT
PACK COLON (CUSTOM PROCEDURE TRAY) ×1 IMPLANT
PAD POSITIONING PINK XL (MISCELLANEOUS) ×1 IMPLANT
PENCIL SMOKE EVACUATOR (MISCELLANEOUS) IMPLANT
PROTECTOR NERVE ULNAR (MISCELLANEOUS) ×2 IMPLANT
RELOAD AUTO 90-4.8 TA90 GRN (ENDOMECHANICALS) ×1
RELOAD PROXIMATE 75MM BLUE (ENDOMECHANICALS) ×2
RELOAD STAPLE 45 3.5 BLU DVNC (STAPLE) IMPLANT
RELOAD STAPLE 45 4.3 GRN DVNC (STAPLE) IMPLANT
RELOAD STAPLE 60 3.5 BLU DVNC (STAPLE) IMPLANT
RELOAD STAPLE 60 4.3 GRN DVNC (STAPLE) IMPLANT
RELOAD STAPLE 75 3.8 BLU REG (ENDOMECHANICALS) IMPLANT
RELOAD STAPLE 90 GRN THCK DST (ENDOMECHANICALS) IMPLANT
RETRACTOR WND ALEXIS 18 MED (MISCELLANEOUS) IMPLANT
RTRCTR WOUND ALEXIS 18CM MED (MISCELLANEOUS)
SCISSORS LAP 5X35 DISP (ENDOMECHANICALS) IMPLANT
SCISSORS MNPLR CVD DVNC XI (INSTRUMENTS) ×1 IMPLANT
SEAL UNIV 5-12 XI (MISCELLANEOUS) ×4 IMPLANT
SEALER VESSEL EXT DVNC XI (MISCELLANEOUS) ×1 IMPLANT
SLEEVE ADV FIXATION 5X100MM (TROCAR) IMPLANT
SOL ELECTROSURG ANTI STICK (MISCELLANEOUS) ×1
SOLUTION ELECTROSURG ANTI STCK (MISCELLANEOUS) ×1 IMPLANT
SPIKE FLUID TRANSFER (MISCELLANEOUS) ×1 IMPLANT
STAPLER 60 SUREFORM DVNC (STAPLE) IMPLANT
STAPLER ECHELON POWER CIR 29 (STAPLE) IMPLANT
STAPLER ECHELON POWER CIR 31 (STAPLE) IMPLANT
STAPLER PROXIMATE 75MM BLUE (STAPLE) IMPLANT
STAPLER RELOAD 3.5X45 BLU DVNC (STAPLE)
STAPLER RELOAD 3.5X60 BLU DVNC (STAPLE)
STAPLER RELOAD 4.3X45 GRN DVNC (STAPLE)
STAPLER RELOAD 4.3X60 GRN DVNC (STAPLE)
STOPCOCK 4 WAY LG BORE MALE ST (IV SETS) ×2 IMPLANT
SURGILUBE 2OZ TUBE FLIPTOP (MISCELLANEOUS) ×1 IMPLANT
SUT MNCRL AB 4-0 PS2 18 (SUTURE) ×1 IMPLANT
SUT PDS AB 1 CT1 27 (SUTURE) IMPLANT
SUT PDS AB 1 TP1 96 (SUTURE) IMPLANT
SUT PROLENE 0 CT 2 (SUTURE) IMPLANT
SUT PROLENE 2 0 KS (SUTURE) ×1 IMPLANT
SUT PROLENE 2 0 SH DA (SUTURE) IMPLANT
SUT SILK 2 0 (SUTURE)
SUT SILK 2 0 SH CR/8 (SUTURE) IMPLANT
SUT SILK 2-0 18XBRD TIE 12 (SUTURE) IMPLANT
SUT SILK 3 0 (SUTURE) ×1
SUT SILK 3 0 SH CR/8 (SUTURE) ×1 IMPLANT
SUT SILK 3-0 18XBRD TIE 12 (SUTURE) ×1 IMPLANT
SUT V-LOC BARB 180 2/0GR6 GS22 (SUTURE)
SUT VIC AB 3-0 SH 18 (SUTURE) IMPLANT
SUT VIC AB 3-0 SH 27 (SUTURE)
SUT VIC AB 3-0 SH 27XBRD (SUTURE) IMPLANT
SUT VICRYL 0 UR6 27IN ABS (SUTURE) ×1 IMPLANT
SUTURE V-LC BRB 180 2/0GR6GS22 (SUTURE) IMPLANT
SYR 10ML LL (SYRINGE) ×1 IMPLANT
SYS LAPSCP GELPORT 120MM (MISCELLANEOUS)
SYS WOUND ALEXIS 18CM MED (MISCELLANEOUS) ×1
SYSTEM LAPSCP GELPORT 120MM (MISCELLANEOUS) IMPLANT
SYSTEM WOUND ALEXIS 18CM MED (MISCELLANEOUS) ×1 IMPLANT
TAPE UMBILICAL 1/8 X36 TWILL (MISCELLANEOUS) ×1 IMPLANT
TOWEL OR NON WOVEN STRL DISP B (DISPOSABLE) ×1 IMPLANT
TRAY FOLEY MTR SLVR 16FR STAT (SET/KITS/TRAYS/PACK) ×1 IMPLANT
TROCAR ADV FIXATION 5X100MM (TROCAR) ×1 IMPLANT
TUBING CONNECTING 10 (TUBING) ×3 IMPLANT
TUBING INSUFFLATION 10FT LAP (TUBING) ×1 IMPLANT

## 2023-09-04 NOTE — H&P (Signed)
CC: Here today for surgery  HPI: Colin Griffin is an 72 y.o. male with history of DM, whom is seen in the office today as a referral by Dr. [name] for evaluation of polypoid mass at 35 cm, presumably sigmoid colon.   Colonoscopy with Dr. Allegra Lai at [facility] 04/12/23:  - Three 5 to 7 mm polyps in the cecum, removed with a cold snare. Resected and retrieved. - One greater than 50 mm polyp in the proximal ascending colon, removed with mucosal resection. Resected and retrieved. Clip manufacturer: [employer]. Clips (MR safe) were placed. \- One greater than 50 mm polyp in the distal ascending colon. Resection not attempted. Tattooed. - One greater than 50 mm polyp in the sigmoid colon in the descending colon. Resection not attempted. - Two 9 to 10 mm polyps in the descending colon, removed with a hot snare. Resected and retrieved. - One 4 mm polyp in the descending colon, removed with a cold snare. Resected and retrieved. - One 15 mm polyp in the sigmoid colon, removed with a hot snare. Resected and retrieved. - The distal rectum and anal verge are normal on retroflexion view. - Mucosal resection was performed. Resection and retrieval were complete  PATH DIAGNOSIS: A. COLON POLYPS X 3, CECUM; COLD SNARE: - MULTIPLE FRAGMENTS OF TUBULAR ADENOMAS. - NEGATIVE FOR HIGH-GRADE DYSPLASIA AND MALIGNANCY.  B. COLON POLYP, ASCENDING; HOT SNARE: - MULTIPLE FRAGMENTS OF TUBULAR ADENOMA. - CAUTERIZED POLYP BASE CANNOT BE RELIABLY ASSESSED SECONDARY TO SPECIMEN FRAGMENTATION. - NEGATIVE FOR HIGH-GRADE DYSPLASIA AND MALIGNANCY.  C. COLON POLYPS X 3, DESCENDING; HOT (X 2) AND COLD SNARES: - MULTIPLE FRAGMENTS OF TUBULAR ADENOMAS. - CAUTERIZED POLYP BASES CANNOT BE RELIABLY ASSESSED SECONDARY TO SPECIMEN FRAGMENTATION. - NEGATIVE FOR HIGH-GRADE DYSPLASIA AND MALIGNANCY.  D. COLON POLYP, SIGMOID; HOT SNARE: - TUBULAR ADENOMA. - CAUTERIZED POLYP BASE APPEARS FREE OF DYSPLASIA. - NEGATIVE FOR  HIGH-GRADE DYSPLASIA AND MALIGNANCY.   Colonoscopy with Dr. [name] 06/29/23: - Hemorrhoids found on digital rectal exam. - There was significant looping of the colon. - Post mucosectomy scar in the ascending colon. - One greater than 50 mm polyp in the ascending colon, removed with piecemeal mucosal resection. Resected and retrieved. Treated with STSC. Injected. - One greater than 50 mm polyp in the transverse colon, removed with piecemeal mucosal resection. Resected and retrieved. Treated with STSC. Tattooed on contralateral wall. Injected. - One greater than 50 mm polyp in the descending colon, removed with piecemeal mucosal resection. Resected and retrieved. Treated with STSC. Tattooed on contralateral wall. Injected. - One greater than 50 mm polyp in the descending colon, removed with piecemeal mucosal resection. Resected and retrieved. Treated with a STSC. Tattooed dis adjacent. Injected. - Rule out malignancy, polypoid lesion in the sigmoid colon (35 cm from anal os). Tissue was removed. Incomplete resection. Further biopsied. Tattooed distally. - Four 5 to 10 mm polyps in the sigmoid colon and in the transverse colon, removed with a cold snare. Resected and retrieved. - Non-bleeding non-thrombosed external and internal hemorrhoids.  PATH A. ASCENDING COLON, POLYPECTOMY:  Tubular adenoma, multiple fragments  Negative for high-grade dysplasia and carcinoma   B. COLON, POLYPECTOMY:  Tubular adenoma, 7 fragments  Negative for high-grade dysplasia and carcinoma   C. TRANSVERSE COLON, POLYPECTOMY:  Tubular adenoma, 5 fragments  Negative for high-grade dysplasia and carcinoma   D. DESCENDING COLON, EMR #2, POLYPECTOMY:  Tubular adenoma, multiple fragments  Negative for high-grade dysplasia and carcinoma   E. DESCENDING COLON, EMR #1, POLYPECTOMY:  Tubular adenoma, multiple fragments  Negative for high-grade dysplasia carcinoma   F. SIGMOID COLON, POLYPOID LESION, BIOPSY:   Ulcerated tubular adenoma with at least high-grade dysplasia  Suspicious for invasive carcinoma   CT A/P 03/16/23: IMPRESSION: 1. Left ischial rectal fossa drain in appropriate position. Interval resolution of previously seen left ischiorectal fossa abscess. 2. Unchanged 2.5 x 1.5 cm abscess within the right side of the prostate. Diffusely dilated prosthetic urethra. 3. Diffuse bladder wall thickening likely due to under distension. Chronic cystitis and outlet obstruction can have a similar appearance. 4. Cholelithiasis.  CT Chest 03/30/23 IMPRESSION: 1. Lung-RADS 3, probably benign findings. Short-term follow-up in 6 months is recommended with repeat low-dose chest CT without contrast (please use the following order, CT CHEST LCS NODULE FOLLOW-UP W/O CM). 2. Mild lower lung predominant reticular opacities, concerning for interstitial lung disease, this could be further evaluated with dedicated ILD protocol CT. 3. Aortic Atherosclerosis (ICD10-I70.0) and Emphysema (ICD10-J43.9).  CEA 07/06/23: 7.9  He was referred to see [place] to consider surgical resection of his involved presumed sigmoid colon.  He does report that his sister he believes has Lynch syndrome. He has not been tested himself. He does have a living son. He denies any complaints at present. He denies any abdominal pain, nausea, vomiting. He is having regular bowel movements without blood.  He does have a recent history of prostate cancer and prostate abscess with drainage catheter placement back in March. This has all been treated now with radiation and he is actively following with uro-/oncology for this purpose.  He has had a significant weight loss since February but over the last month he has had a 6 pound weight gain.  PMH: DM (avg CBG 118 on continuous monitor)  PSH: Open appendectomy, 1968  FHx: Multiple relatives with breast and uterine cancer. He believes his nonidentical twin sister has Lynch syndrome. He has  not been tested.  Social Hx: Active tobacco use-1 pack/day. Plans to quit today. Denies use of EtOH/illicit drug. 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   He denies any changes in health or health history since we met in the office. No new medications/allergies. He states he is ready for surgery today - tolerated bowel prep with satisfactory result. Denies abdominal pain, nausea or emesis.  Past Medical History:  Diagnosis Date   Cancer [facility]) April 2024   Constipation 03/04/2023   Diabetes mellitus without complication [facility]) March 2024    Past Surgical History:  Procedure Laterality Date   APPENDECTOMY     BIOPSY  06/29/2023   Procedure: BIOPSY;  Surgeon: Lemar Lofty., MD;  Location: [facility];  Service: Gastroenterology;;   COLONOSCOPY WITH PROPOFOL N/A 04/12/2023   Procedure: COLONOSCOPY WITH PROPOFOL;  Surgeon: [name], MD;  Location: [facility] ENDOSCOPY;  Service: Gastroenterology;  Laterality: N/A;   COLONOSCOPY WITH PROPOFOL N/A 06/29/2023   Procedure: COLONOSCOPY WITH PROPOFOL;  Surgeon: [name] Netty Starring., MD;  Location: [facility] ENDOSCOPY;  Service: Gastroenterology;  Laterality: N/A;   ENDOSCOPIC MUCOSAL RESECTION N/A 06/29/2023   Procedure: ENDOSCOPIC MUCOSAL RESECTION;  Surgeon: [name] Netty Starring., MD;  Location: [facility] ENDOSCOPY;  Service: Gastroenterology;  Laterality: N/A;   HEMOSTASIS CONTROL  06/29/2023   Procedure: HEMOSTASIS CONTROL;  Surgeon: Lemar Lofty., MD;  Location: [facility];  Service: Gastroenterology;;   IR RADIOLOGIST EVAL & MGMT  03/16/2023   PELVIC ABCESS DRAINAGE     March 2024  POLYPECTOMY  06/29/2023   Procedure: POLYPECTOMY;  Surgeon: Mansouraty, Netty Starring., MD;  Location: [facility];  Service: Gastroenterology;;   Sunnie Nielsen LIFTING INJECTION  06/29/2023   Procedure: SUBMUCOSAL LIFTING INJECTION;  Surgeon: Lemar Lofty., MD;   Location: [facility];  Service: Gastroenterology;;   SUBMUCOSAL TATTOO INJECTION  06/29/2023   Procedure: SUBMUCOSAL TATTOO INJECTION;  Surgeon: Lemar Lofty., MD;  Location: [facility];  Service: Gastroenterology;;   TRANSURETHRAL RESECTION OF PROSTATE N/A 03/03/2023   Procedure: TRANSURETHRAL RESECTION OF THE PROSTATE (TURP)/ UNROOFING OF PROSTATE ABSCESS;  Surgeon: Riki Altes, MD;  Location: [facility] ORS;  Service: Urology;  Laterality: N/A;    Family History  Problem Relation Age of Onset   Atrial fibrillation Mother    Skin cancer Father        non-melanoma   Colon cancer Sister 5       Lynch Syndrome   Breast cancer Sister 22   Diabetes Brother    Throat cancer Maternal Uncle    Breast cancer Paternal Aunt 41 - 36   Colon cancer Maternal Grandmother 15   Alcohol abuse Maternal Grandfather    Breast cancer Paternal Grandmother 102   Prostate cancer Paternal Grandfather 54 - 59   Diabetes Other    Ovarian cancer Other 20       paternal first cousin once removed   Other Niece        2 nieces have Lynch Syndrome (brother's daughters)   Colon polyps Neg Hx    Stomach cancer Neg Hx    Esophageal cancer Neg Hx    Inflammatory bowel disease Neg Hx    Liver disease Neg Hx    Pancreatic cancer Neg Hx     Social:  reports that he has been smoking cigarettes. He has a 54 pack-year smoking history. He has never used smokeless tobacco. He reports that he does not drink alcohol and does not use drugs.  Allergies: No Known Allergies  Medications: I have reviewed the patient's current medications.  No results found for this or any previous visit (from the past 48 hour(s)).  No results found.   PE Blood pressure 119/73, pulse (!) 59, temperature 97.9 F (36.6 C), temperature source Oral, resp. rate 16, height 6\' 1"  (1.854 m), weight 76.9 kg, SpO2 100%. Constitutional: NAD; conversant Eyes: Moist conjunctiva; no lid lag; anicteric Lungs: Normal respiratory  effort CV: RRR GI: Abd soft, NT/ND; no palpable hepatosplenomegaly Psychiatric: Appropriate affect  No results found for this or any previous visit (from the past 48 hour(s)).  No results found.  A/P: Akari Crysler Heinicke is an 72 y.o. male with hx of DM here for evaluation of polypoid mass at 35 cm, presumably sigmoid, with at least high-grade dysplasia.  CT A/P 02/2023 - no evidence of any metastatic disease CT Chest 03/2023 - following with oncology/Sarah Groce for Lung-RADS 3 lesion  -Numerous other polyps, these have all now been removed with Dr. [name]. Sister with possible Lynch syndrome whom is a nonidentical twin.  -Has ultimately seen genetics, as noted below, he wishes to continue with surveillance endoscopy and segmental colectomy as opposed to total colectomy regardless of his genetics results. He understands a higher lifetime risk of recurrence, need for further procedures, development of colorectal cancer.  -We spent time discussing everything at length with regards to his current diagnosis and previous polyp burden which has now apparently been controlled endoscopically. We discussed the possibility of annual surveillance colonoscopies for monitoring the rest of his  colon and a sigmoid colectomy. We also discussed the option of a total abdominal colectomy if genetics positive as a means to reduce the risk of subsequent polyp recurrence and/or even colon cancer. He would prefer to keep his colon in place and undergo surveillance, acknowledging the risk of recurrence down the road. He does not think a positive genetic screen will influence his decision.  -The anatomy and physiology of the GI tract was reviewed with the patient. The pathophysiology of colon polyps and cancer was discussed as well with associated pictures. -We have discussed various different treatment options going forward including surgery (the most definitive) to address this -robotic assisted sigmoidectomy,  possible low anterior section, flexible sigmoidoscopy, intraoperative assessment of perfusion with ICG. -The planned procedure, material risks (including, but not limited to, pain, bleeding, infection, scarring, need for blood transfusion, damage to surrounding structures- blood vessels/nerves/viscus/organs, damage to ureter, urine leak, leak from anastomosis, need for additional procedures, sexual dysfunction, low anterior resection syndrome (LARS) = increased fecal urgency and/or frequency, scenarios where a stoma may be necessary and where it may be permanent, worsening of pre-existing medical conditions, chronic diarrhea, constipation secondary to narcotic use, hernia, recurrence, pneumonia, heart attack, stroke, death) benefits and alternatives to surgery were discussed at length. The patient's questions were answered to his satisfaction, he voiced understanding and elected to proceed with surgery. Additionally, we discussed typical postoperative expectations and the recovery process.   Marin Olp, MD [facility]

## 2023-09-04 NOTE — Plan of Care (Signed)
  Problem: Education: Goal: Understanding of discharge needs will improve Outcome: Progressing   Problem: Education: Goal: Knowledge of General Education information will improve Description: Including pain rating scale, medication(s)/side effects and non-pharmacologic comfort measures Outcome: Progressing

## 2023-09-04 NOTE — Anesthesia Preprocedure Evaluation (Signed)
Anesthesia Evaluation  Patient identified by MRN, date of birth, ID band Patient awake    Reviewed: Allergy & Precautions, H&P , NPO status , Patient's Chart, lab work & pertinent test results  Airway Mallampati: II  TM Distance: >3 FB Neck ROM: Full    Dental  (+) Teeth Intact, Dental Advisory Given   Pulmonary Current Smoker and Patient abstained from smoking. 5cigg/d and vaping as well, 45 pack year history    Pulmonary exam normal breath sounds clear to auscultation       Cardiovascular negative cardio ROS Normal cardiovascular exam Rhythm:Regular Rate:Normal     Neuro/Psych negative neurological ROS  negative psych ROS   GI/Hepatic negative GI ROS, Neg liver ROS,,,  Endo/Other  diabetes, Well Controlled, Type 2, Oral Hypoglycemic Agents  FS 127 preop  Renal/GU negative Renal ROS  negative genitourinary   Musculoskeletal negative musculoskeletal ROS (+)    Abdominal   Peds negative pediatric ROS (+)  Hematology  (+) Blood dyscrasia, anemia Hb 11.9, plt 192   Anesthesia Other Findings   Reproductive/Obstetrics negative OB ROS                             Anesthesia Physical Anesthesia Plan  ASA: 2  Anesthesia Plan: General   Post-op Pain Management: Tylenol PO (pre-op)*   Induction: Intravenous  PONV Risk Score and Plan: 1 and Ondansetron, Dexamethasone, Midazolam and Treatment may vary due to age or medical condition  Airway Management Planned: Oral ETT  Additional Equipment: None  Intra-op Plan:   Post-operative Plan: Extubation in OR  Informed Consent: I have reviewed the patients History and Physical, chart, labs and discussed the procedure including the risks, benefits and alternatives for the proposed anesthesia with the patient or authorized representative who has indicated his/her understanding and acceptance.     Dental advisory given  Plan Discussed with:  CRNA  Anesthesia Plan Comments:        Anesthesia Quick Evaluation

## 2023-09-04 NOTE — Op Note (Signed)
PATIENT: Colin Griffin  72 y.o. male  Patient Care Team: Ronnald Ramp, MD as PCP - General (Family Medicine)  PREOP DIAGNOSIS: COLON MASS, SIGMOID  POSTOP DIAGNOSIS: COLON MASS, SIGMOID  PROCEDURE:  Robotic assisted partial colectomy Takedown of splenic flexure Diagnostic flexible sigmoidoscopy (necessary to confirm lesion location) Bilateral transversus abdominus plane (TAP) blocks  SURGEON: Stephanie Coup. Cliffton Asters, MD  ASSISTANT: Melody Haver, MD  ANESTHESIA: General endotracheal  EBL: 20 mL Total I/O In: 950 [I.V.:850; IV Piggyback:100] Out: 220 [Urine:100; Other:100; Blood:20]  DRAINS: None  SPECIMEN: Descending colon-stitch proximal  COUNTS: Sponge, needle and instrument counts were reported correct x2  FINDINGS: Tattoo identified in the mid descending colon.  There is also tattoo noted in his transverse colon and ascending colon/cecum as known previously.  There is no tattoo visible in the sigmoid colon or in the intraperitoneal rectum.  Therefore, flexible sigmoidoscopy was utilized for diagnostic purposes to identify the exact location of the lesion and ensure there is no tattoo distal to the descending colon.  Lesion/mucosal irregularity was noted just proximal to the descending colon tattoo.  There is no evident tattoo distal to this.  Given these findings, we proceeded with a partial colectomy of this involved segment removing the abnormal lesion and associated lymph nodes.  Splenic flexure taken down to facilitate a colocolonic anastomosis.  Normal peritoneal surfaces.  Normal liver surface.    NARRATIVE: Informed consent was verified. The patient was taken to the operating room, placed supine on the operating table and SCD's were applied. General endotracheal anesthesia was induced without difficulty.  He was then positioned in the lithotomy position with Allen stirrups.  Pressure points were evaluated and padded.  A foley catheter was then placed  by nursing under sterile conditions. Hair on the abdomen was clipped.  He was secured to the operating table. The abdomen was then prepped and draped in the standard sterile fashion. Surgical timeout was called indicating the correct patient, procedure, positioning and need for preoperative antibiotics.   An OG tube was placed by anesthesia and confirmed to be to suction.  At Palmer's point, a stab incision was created and the Veress needle was introduced into the peritoneal cavity on the first attempt.  Intraperitoneal location was confirmed by the aspiration and saline drop test.  Pneumoperitoneum was established to a maximum pressure of 15 mmHg using CO2.  Following this, the abdomen was marked for planned trocar sites.  Just to the right and cephalad to the umbilicus, an 8 mm incision was created and an 8 mm blunt tipped robotic trocar was cautiously placed into the peritoneal cavity.  The laparoscope was inserted and demonstrated no evidence of trocar site nor Veress needle site complications.  The Veress needle was removed.  Bilateral transversus abdominis plane blocks were then created using a dilute mixture of Exparel with Marcaine.  3 additional 8 mm robotic trochars were placed under direct visualization roughly in a line extending from the right ASIS towards the left upper quadrant.   An additional 5 mm assist port was placed in the right lateral abdomen under direct visualization.  The abdomen was surveyed and there was normal-appearing peritoneal surfaces.  There are tattoo was noted in both the right colon near his cecum/ascending colon as well as in the mid to distal transverse colon.  There is also a tattoo identified in the mid descending colon.  The pelvis is free of tattoo. He was positioned in Trendelenburg with the left side tilted slightly up.  Small bowel was carefully retracted out of the pelvis.  The robot was then docked and I went to the console.   The sigmoid colon was readily  identified.  Attachments of the sigmoid colon were taken down from the intersigmoid fossa.  The sigmoid was fully mobilized along with its associated mesentery.  Were able to identify the gonadal vessels followed by the psoas muscle and ultimately the left ureter.  The entire sigmoid colon is mobilized and the ureter and gonadal vessels were both protected free of injury.  The descending colon is then grasped and retracted medially.  The [name] line of Toldt was incised all the way up to the splenic flexure.  The associate mesocolon was mobilized medially.  We carefully inspected the entire colon distal to his descending colon tattoo all the way down to the proximal rectum.  Were not able to identify any other evidence of tattooing.  There are also no evident masses or lesions.  Given the somewhat discrepant findings intraoperatively with what was noted on his colonoscope at least with regards to location estimations, I opted to go below for a flexible sigmoidoscopy.  Prior to this, a gentle tip up grasper was carefully placed across the proximal descending colon.  I then went below and passed the flexible sigmoidoscope under direct visualization.  The sigmoidoscope was advanced through the anal canal and into the rectum.  It was then carefully advanced all the way up to the level of the splenic flexure.  The scope was then carefully withdrawn.  There is an evident tattoo in the descending colon.  Just proximal to this there is certainly irregular appearing mucosa with significant scarring and a slight masslike appearance to it.  Endoscopically this correlates with the appearance noted.  The scope was then carefully withdrawn under direct visualization and the mucosal surfaces were meticulously inspected.  There is no evident tattooing or other abnormalities noted from that location all the way down to the anal canal.  The sigmoidoscope was then reinserted under direct visualization although up to the splenic  flexure and we brought it out again slowly looking to ensure nothing was missed.  Air was then evacuated from the colon.  I went back to the console.  The splenic flexure was fully mobilized by incising the Lachelle Rissler line of Toldt up to and around the splenic flexure.  The spleen does sit somewhat posterior to his flexure.  The associated colon is mobilized off the splenic flexure and in doing so we have the descending colon reaching across the midline of his abdomen.  We then turned our attention to the extracorporeal portion of the procedure.  A grasper was first placed onto the tattoo.  The robot was then undocked.  I scrubbed back in.  An upper midline incision was created with a scalpel.  Subcutaneous tissue divided electrocautery.  The fascia was incised in the midline.  An Alexis wound protector was then placed.  Were able to deliver the descending colon out of the wound protector.  There is a palpable masslike lesion just proximal to his tattoo.  An area was marked 5 cm distal to this and a window was created the mesentery at this level.  The colon was divided with a GIA 75 blue load stapler.  Staple line is inspected noted to have well-formed staples on both sides.  The proximal point of planned transection is in the proximalmost aspect of the descending colon.  At this location, there is a nice vessel that  appears to be originating from the middle colic's and goes all the way out to our planned point of proximal transection which is at least 5 cm proximal to the palpable lesion.  This was divided in a similar manner with a 75 mm GIA blue stapler.  Staples are inspected and well-formed.  They are hemostatic.  Were able to include a branch of the left colic with the specimen for lymph node purposes.  This mesentery was ligated and divided using a hand-held LigaSure device.  The cut edge of the mesentery is inspected and noted to be hemostatic.  The specimen is oriented with a single stitch proximally on the  proximal staple line.  This was passed off.  Attention was then turned to creating a colocolonic anastomosis.  It appears that a side-to-side anastomosis reaches well without any tension on either side and therefore this approach was selected.  A window was created in the antimesenteric side of each respective limb of colon.  Orientation is confirmed such a there is no twisting.  There is also nothing within the planned anastomosis.  This is on each respective antimesenteric side.  The 2 pieces were brought together and a 75 mm GIA blue stapler was used to create a colocolonic anastomosis.  The staple line is inspected noted to be both hemostatic and with well-formed staples.  Allis clamps were placed across the common colotomy.  This was then closed using a TA 60 blue load stapler after distracting the anastomotic staple lines.  The corners of the TA closure are then dunked using 3-0 silk suture.  A 3-0 silk stitch was also placed at the apex of the anastomosis.  The anastomosis is palpated and finally widely patent, 3 fingerbreadths in diameter.  There is a palpable pulse going up to each respective limb of bowel.  This is then placed back into the abdomen.  This lays in a nice tension-free manner in the left abdomen.  Omentum is then brought down over this and across the midline.  The abdomen is irrigated.  Hemostasis is verified.  The abdomen and pelvis are surveyed and noted to be completely hemostatic without any apparent injury.  Under direct visualization, all trochars are removed.  The Alexis wound protector was removed.  Gowns/gloves are changed and a fresh set of clean instruments utilized. Additional sterile drapes were placed around the field.   The midline fascia was then closed using 2 running #1 PDS sutures.  The fascia was then palpated and noted to be completely closed.  Additional anesthetic was infiltrated at the Pfannenstiel site.  Sponge, needle, and instrument counts were reported  correct x2.  The wounds are washed and dried.  4-0 Monocryl subcuticular suture was used to close the skin of all incision sites.  Dermabond was placed over all incisions.    He was then taken out of lithotomy, awakened from anesthesia, extubated, and transferred to a stretcher for transport to PACU in satisfactory condition having tolerated the procedure well.

## 2023-09-04 NOTE — Transfer of Care (Signed)
Immediate Anesthesia Transfer of Care Note  Patient: Colin Griffin  Procedure(s) Performed: ROBOTIC PARTIAL COLECTOMY WITH MOBILIZATION OF THE SPLENIC FLEXURE FLEXIBLE SIGMOIDOSCOPY  Patient Location: PACU  Anesthesia Type:General  Level of Consciousness: drowsy  Airway & Oxygen Therapy: Patient Spontanous Breathing and Patient connected to face mask oxygen  Post-op Assessment: Report given to RN and Post -op Vital signs reviewed and stable  Post vital signs: Reviewed and stable  Last Vitals:  Vitals Value Taken Time  BP 148/97 09/04/23 1249  Temp    Pulse 89 09/04/23 1250  Resp 13 09/04/23 1250  SpO2 98 % 09/04/23 1250  Vitals shown include unfiled device data.  Last Pain:  Vitals:   09/04/23 0745  TempSrc: Oral         Complications: No notable events documented.

## 2023-09-04 NOTE — Anesthesia Procedure Notes (Signed)
Procedure Name: Intubation Date/Time: 09/04/2023 9:34 AM  Performed by: Irving Burton, CRNAPre-anesthesia Checklist: Patient identified, Patient being monitored, Timeout performed, Emergency Drugs available and Suction available Patient Re-evaluated:Patient Re-evaluated prior to induction Oxygen Delivery Method: Circle system utilized Preoxygenation: Pre-oxygenation with 100% oxygen Induction Type: IV induction Ventilation: Mask ventilation without difficulty Laryngoscope Size: Mac and 4 Grade View: Grade II Tube type: Oral Tube size: 7.5 mm Number of attempts: 1 Airway Equipment and Method: Stylet Placement Confirmation: ETT inserted through vocal cords under direct vision, positive ETCO2 and breath sounds checked- equal and bilateral Secured at: 23 cm Tube secured with: Tape Dental Injury: Teeth and Oropharynx as per pre-operative assessment

## 2023-09-05 ENCOUNTER — Encounter (HOSPITAL_COMMUNITY): Payer: Self-pay | Admitting: Surgery

## 2023-09-05 ENCOUNTER — Telehealth: Payer: Self-pay

## 2023-09-05 LAB — CBC
HCT: 29.2 % — ABNORMAL LOW (ref 39.0–52.0)
Hemoglobin: 9.8 g/dL — ABNORMAL LOW (ref 13.0–17.0)
MCH: 32.7 pg (ref 26.0–34.0)
MCHC: 33.6 g/dL (ref 30.0–36.0)
MCV: 97.3 fL (ref 80.0–100.0)
Platelets: 165 10*3/uL (ref 150–400)
RBC: 3 MIL/uL — ABNORMAL LOW (ref 4.22–5.81)
RDW: 12.7 % (ref 11.5–15.5)
WBC: 10.8 10*3/uL — ABNORMAL HIGH (ref 4.0–10.5)
nRBC: 0 % (ref 0.0–0.2)

## 2023-09-05 LAB — BASIC METABOLIC PANEL
Anion gap: 9 (ref 5–15)
BUN: 14 mg/dL (ref 8–23)
CO2: 21 mmol/L — ABNORMAL LOW (ref 22–32)
Calcium: 8.5 mg/dL — ABNORMAL LOW (ref 8.9–10.3)
Chloride: 105 mmol/L (ref 98–111)
Creatinine, Ser: 0.98 mg/dL (ref 0.61–1.24)
GFR, Estimated: >60 mL/min (ref >60)
Glucose, Bld: 106 mg/dL — ABNORMAL HIGH (ref 70–99)
Potassium: 4 mmol/L (ref 3.5–5.1)
Sodium: 135 mmol/L (ref 135–145)

## 2023-09-05 NOTE — Anesthesia Postprocedure Evaluation (Signed)
Anesthesia Post Note  Patient: Colin Griffin  Procedure(s) Performed: ROBOTIC PARTIAL COLECTOMY WITH MOBILIZATION OF THE SPLENIC FLEXURE FLEXIBLE SIGMOIDOSCOPY     Anesthesia Type: General Anesthetic complications: no   No notable events documented.  Last Vitals:  Vitals:   09/05/23 0035 09/05/23 0530  BP: 110/63 121/68  Pulse: 78 73  Resp: 17 17  Temp: 37.1 C 37.1 C  SpO2: 95% 96%    Last Pain:  Vitals:   09/05/23 0614  TempSrc:   PainSc: 7                  Lannie Fields

## 2023-09-05 NOTE — Telephone Encounter (Signed)
Appt cancelled and recall entered-spot left open

## 2023-09-05 NOTE — Telephone Encounter (Signed)
-----   Message from [facility] sent at 09/04/2023  4:38 PM EDT ----- Regarding: 10/15 colonoscopy Colin Griffin, This patient underwent partial colectomy for colon cancer. Change our recall to 4-6 months from today for colon polyp surveillance. Hold that slot for at least a day before we add someone Thanks. GM

## 2023-09-05 NOTE — Plan of Care (Signed)
Problem: Education: Goal: Understanding of discharge needs will improve Outcome: Progressing   Problem: Activity: Goal: Ability to tolerate increased activity will improve Outcome: Progressing   Problem: Nutritional: Goal: Will attain and maintain optimal nutritional status will improve Outcome: Progressing

## 2023-09-05 NOTE — Plan of Care (Signed)
  Problem: Education: Goal: Understanding of discharge needs will improve Outcome: Progressing Goal: Verbalization of understanding of the causes of altered bowel function will improve Outcome: Progressing

## 2023-09-05 NOTE — Progress Notes (Signed)
Subjective No acute events. Feeling well. Tolerating clears without n/v. Ambulating. Pain controlled.  Objective: Vital signs in last 24 hours: Temp:  [97 F (36.1 C)-98.8 F (37.1 C)] 98.8 F (37.1 C) (10/08 0530) Pulse Rate:  [73-91] 73 (10/08 0530) Resp:  [10-20] 17 (10/08 0530) BP: (110-165)/(63-97) 121/68 (10/08 0530) SpO2:  [92 %-100 %] 96 % (10/08 0530) Weight:  [78 kg] 78 kg (10/08 0500) Last BM Date : 09/04/23  Intake/Output from previous day: 10/07 0701 - 10/08 0700 In: 2678.8 [P.O.:660; I.V.:1918.8; IV Piggyback:100] Out: 2120 [Urine:2000; Blood:20] Intake/Output this shift: No intake/output data recorded.  Gen: NAD, comfortable CV: RRR Pulm: Normal work of breathing Abd: Soft, appropriate tenderness, incision c/d/I without erythema nor drainage.  Ext: SCDs in place  Lab Results: CBC  Recent Labs    09/04/23 1257 09/05/23 0437  WBC 9.4 10.8*  HGB 12.1* 9.8*  HCT 36.0* 29.2*  PLT 217 165   BMET Recent Labs    09/04/23 1257 09/05/23 0437  NA  --  135  K  --  4.0  CL  --  105  CO2  --  21*  GLUCOSE  --  106*  BUN  --  14  CREATININE 1.21 0.98  CALCIUM  --  8.5*   PT/INR No results for input(s): "LABPROT", "INR" in the last 72 hours. ABG No results for input(s): "PHART", "HCO3" in the last 72 hours.  Invalid input(s): "PCO2", "PO2"  Studies/Results:  Anti-infectives: Anti-infectives (From admission, onward)    Start     Dose/Rate Route Frequency Ordered Stop   09/04/23 1400  neomycin (MYCIFRADIN) tablet 1,000 mg  Status:  Discontinued       Placed in "And" Linked Group   1,000 mg Oral 3 times per day 09/04/23 0725 09/04/23 0728   09/04/23 1400  metroNIDAZOLE (FLAGYL) tablet 1,000 mg  Status:  Discontinued       Placed in "And" Linked Group   1,000 mg Oral 3 times per day 09/04/23 0725 09/04/23 0728   09/04/23 0730  cefoTEtan (CEFOTAN) 2 g in sodium chloride 0.9 % 100 mL IVPB        2 g 200 mL/hr over 30 Minutes Intravenous On call to  O.R. 09/04/23 0725 09/04/23 1009        Assessment/Plan: Patient Active Problem List   Diagnosis Date Noted   S/P partial colectomy 09/04/2023   Family history of rectal cancer 07/01/2023   Abnormal colonoscopy 07/01/2023   Hx of adenomatous colonic polyps 07/01/2023   Prostate cancer (HCC) 04/29/2023   Adenomatous polyp of ascending colon 04/12/2023   Cecal polyp 04/12/2023   Adenomatous polyp of descending colon 04/12/2023   Adenomatous polyp of sigmoid colon 04/12/2023   Healthcare maintenance 03/16/2023   Pelvic abscess in male [facility]) 03/01/2023   Prostatitis 03/01/2023   Type 2 diabetes mellitus with hyperglycemia, without long-term current use of insulin (HCC) 02/15/2023   History of smoking greater than 50 pack years 02/14/2023   s/p Procedure(s): ROBOTIC PARTIAL COLECTOMY WITH MOBILIZATION OF THE SPLENIC FLEXURE FLEXIBLE SIGMOIDOSCOPY 09/04/2023  - Doing well - D/C IVF - Adv to soft diet - Acute blood loss anemia on top of pre-existing anemia; repeat CBC tomorrow; iron studies ordered - PPX: SQH, SCDs  We spent time reviewing his procedure, findings, and plans moving forward. All of his questions were answered to his satisfaction, he expressed understanding and agreed with the plan   LOS: 1 day   Marin Olp, MD [facility]

## 2023-09-05 NOTE — Progress Notes (Signed)
   09/05/23 0832  TOC Brief Assessment  Insurance and Status Reviewed  Patient has primary care physician Yes  Home environment has been reviewed Resides with spouse  Prior level of function: Independent at baseline  Prior/Current Home Services No current home services  Social Determinants of Health Reivew SDOH reviewed no interventions necessary  Readmission risk has been reviewed Yes  Transition of care needs no transition of care needs at this time

## 2023-09-06 ENCOUNTER — Other Ambulatory Visit (HOSPITAL_COMMUNITY): Payer: Self-pay

## 2023-09-06 ENCOUNTER — Encounter: Payer: Self-pay | Admitting: *Deleted

## 2023-09-06 ENCOUNTER — Other Ambulatory Visit: Payer: Self-pay | Admitting: *Deleted

## 2023-09-06 DIAGNOSIS — C187 Malignant neoplasm of sigmoid colon: Secondary | ICD-10-CM

## 2023-09-06 LAB — CBC
HCT: 34 % — ABNORMAL LOW (ref 39.0–52.0)
Hemoglobin: 11.1 g/dL — ABNORMAL LOW (ref 13.0–17.0)
MCH: 32.5 pg (ref 26.0–34.0)
MCHC: 32.6 g/dL (ref 30.0–36.0)
MCV: 99.4 fL (ref 80.0–100.0)
Platelets: 143 10*3/uL — ABNORMAL LOW (ref 150–400)
RBC: 3.42 MIL/uL — ABNORMAL LOW (ref 4.22–5.81)
RDW: 12.6 % (ref 11.5–15.5)
WBC: 9.3 10*3/uL (ref 4.0–10.5)
nRBC: 0 % (ref 0.0–0.2)

## 2023-09-06 LAB — IRON AND TIBC
Iron: 38 ug/dL — ABNORMAL LOW (ref 45–182)
Saturation Ratios: 12 % — ABNORMAL LOW (ref 17.9–39.5)
TIBC: 315 ug/dL (ref 250–450)
UIBC: 277 ug/dL

## 2023-09-06 LAB — SURGICAL PATHOLOGY

## 2023-09-06 MED ORDER — TRAMADOL HCL 50 MG PO TABS
50.0000 mg | ORAL_TABLET | Freq: Four times a day (QID) | ORAL | 0 refills | Status: AC | PRN
Start: 2023-09-06 — End: 2023-09-11
  Filled 2023-09-06: qty 15, 4d supply, fill #0

## 2023-09-06 MED ORDER — FERROUS SULFATE 325 (65 FE) MG PO TABS
325.0000 mg | ORAL_TABLET | Freq: Two times a day (BID) | ORAL | Status: DC
  Administered 2023-09-06: 325 mg via ORAL
  Filled 2023-09-06: qty 1

## 2023-09-06 MED ORDER — VITAMIN C 500 MG PO TABS
250.0000 mg | ORAL_TABLET | Freq: Two times a day (BID) | ORAL | 0 refills | Status: AC
  Filled 2023-09-06: qty 60, 30d supply, fill #0
  Filled 2023-09-06: qty 30, 30d supply, fill #0

## 2023-09-06 MED ORDER — FERROUS SULFATE 325 (65 FE) MG PO TABS
325.0000 mg | ORAL_TABLET | Freq: Two times a day (BID) | ORAL | 0 refills | Status: DC
  Filled 2023-09-06: qty 60, 30d supply, fill #0

## 2023-09-06 NOTE — Discharge Instructions (Addendum)
POST OP INSTRUCTIONS AFTER COLON SURGERY  DIET: Be sure to include lots of fluids daily to stay hydrated - 64oz of water per day (8, 8 oz glasses).  Avoid fast food or heavy meals for the first couple of weeks as your are more likely to get nauseated. Avoid raw/uncooked fruits or vegetables for the first 4 weeks (its ok to have these if they are blended into smoothie form). If you have fruits/vegetables, make sure they are cooked until soft enough to mash on the roof of your mouth and chew your food well. Otherwise, diet as tolerated.  Take your usually prescribed home medications unless otherwise directed.  PAIN CONTROL: Pain is best controlled by a usual combination of three different methods TOGETHER: Ice/Heat Over the counter pain medication Prescription pain medication Most patients will experience some swelling and bruising around the surgical site.  Ice packs or heating pads (30-60 minutes up to 6 times a day) will help. Some people prefer to use ice alone, heat alone, alternating between ice & heat.  Experiment to what works for you.  Swelling and bruising can take several weeks to resolve.   It is helpful to take an over-the-counter pain medication regularly for the first few weeks: Ibuprofen (Motrin/Advil) - 200mg  tabs - take 3 tabs (600mg ) every 6 hours as needed for pain (unless you have been directed previously to avoid NSAIDs/ibuprofen) Acetaminophen (Tylenol) - you may take 650mg  every 6 hours as needed. You can take this with motrin as they act differently on the body. If you are taking a narcotic pain medication that has acetaminophen in it, do not take over the counter tylenol at the same time. NOTE: You may take both of these medications together - most patients  find it most helpful when alternating between the two (i.e. Ibuprofen at 6am, tylenol at 9am, ibuprofen at 12pm ..Marland Kitchen) A  prescription for pain medication should be given to you upon discharge.  Take your pain medication as  prescribed if your pain is not adequatly controlled with the over-the-counter pain reliefs mentioned above. Your iron levels were on the low side. We have prescribed a 1 month course of iron supplementation with Vitamin C. It is important to take the vitamin c with this to help ensure absorption. You should also return to see your primary care physician within the next month for recheck and monitoring.  Avoid getting constipated.  Between the surgery and the pain medications, it is common to experience some constipation.  Increasing fluid intake and taking a fiber supplement (such as Metamucil, Citrucel, FiberCon, MiraLax, etc) 1-2 times a day regularly will usually help prevent this problem from occurring.  A mild laxative (prune juice, Milk of Magnesia, MiraLax, etc) should be taken according to package directions if there are no bowel movements after 48 hours.    Dressing: Your incisions are covered in Dermabond which is like sterile superglue for the skin. This will come off on it's own in a couple weeks. It is waterproof and you may bathe normally starting the day after your surgery in a shower. Avoid baths/pools/lakes/oceans until your wounds have fully healed.  ACTIVITIES as tolerated:   Avoid heavy lifting (>10lbs or 1 gallon of milk) for the next 6 weeks. You may resume regular daily activities as tolerated--such as daily self-care, walking, climbing stairs--gradually increasing activities as tolerated.  If you can walk 30 minutes without difficulty, it is safe to try more intense activity such as jogging, treadmill, bicycling, low-impact aerobics.  DO NOT PUSH THROUGH PAIN.  Let pain be your guide: If it hurts to do something, don't do it. You may drive when you are no longer taking prescription pain medication, you can comfortably wear a seatbelt, and you can safely maneuver your car and apply brakes.  FOLLOW UP in our office Please call [facility] at [phone] to set up an appointment to see  your surgeon in the office for a follow-up appointment approximately 2 weeks after your surgery. Make sure that you call for this appointment the day you arrive home to insure a convenient appointment time.  9. If you have disability or family leave forms that need to be completed, you may have them completed by your primary care physician's office; for return to work instructions, please ask our office staff and they will be happy to assist you in obtaining this documentation   When to call us [phone]: Poor pain control Reactions / problems with new medications (rash/itching, etc)  Fever over 101.5 F (38.5 C) Inability to urinate Nausea/vomiting Worsening swelling or bruising Continued bleeding from incision. Increased pain, redness, or drainage from the incision  The clinic staff is available to answer your questions during regular business hours (8:30am-5pm).  Please don't hesitate to call and ask to speak to one of our nurses for clinical concerns.   A surgeon from [facility] is always on call at the hospitals   If you have a medical emergency, go to the nearest emergency room or call 911.  [facility] [address], [address], [place], [place]  [address] MAIN: [phone] FAX: [phone] [email or web address]

## 2023-09-06 NOTE — Progress Notes (Signed)
D/C instructions given to patient. Patient has no questions. Patient is having a hard time finding a ride. I have notified TOC team member.

## 2023-09-06 NOTE — Care Management Important Message (Signed)
Important Message  Patient Details IM Letter given. Name: Ivon Oelkers MRN: [id number] Date of Birth: 04-Jan-1951   Important Message Given:  Yes - Medicare IM     Caren Macadam 09/06/2023, 8:55 AM

## 2023-09-06 NOTE — Discharge Summary (Signed)
Patient ID: Colin Griffin MRN: [id number] DOB/AGE: 70/12/1950 70 y.o.  Admit date: 09/04/2023 Discharge date: 09/06/2023  Discharge Diagnoses Patient Active Problem List   Diagnosis Date Noted   S/P partial colectomy 09/04/2023   Family history of rectal cancer 07/01/2023   Abnormal colonoscopy 07/01/2023   Hx of adenomatous colonic polyps 07/01/2023   Prostate cancer (HCC) 04/29/2023   Adenomatous polyp of ascending colon 04/12/2023   Cecal polyp 04/12/2023   Adenomatous polyp of descending colon 04/12/2023   Adenomatous polyp of sigmoid colon 04/12/2023   Healthcare maintenance 03/16/2023   Pelvic abscess in male [facility]) 03/01/2023   Prostatitis 03/01/2023   Type 2 diabetes mellitus with hyperglycemia, without long-term current use of insulin (HCC) 02/15/2023   History of smoking greater than 50 pack years 02/14/2023    Consultants None  Procedures OR 09/04/23 - Robotic assisted partial colectomy Takedown of splenic flexure Diagnostic flexible sigmoidoscopy (necessary to confirm lesion location) Bilateral transversus abdominus plane (TAP) blocks   Hospital Course: He was admitted postoperatively and recovered well. His diet was advanced which he tolerated without difficulty. His iron levels were low and this was therefore prescribed to be taken as outpatient. He has been advised to return to primary care for follow-up and monitoring of his blood count/iron levels. Expectations reviewed, restrictions discussed. He is motivated to go home today. We have reviewed things to watch out for and he has been encouraged to call with any questions or concerns.    Allergies as of 09/06/2023   No Known Allergies      Medication List     STOP taking these medications    metroNIDAZOLE 500 MG tablet Commonly known as: FLAGYL   neomycin 500 MG tablet Commonly known as: MYCIFRADIN       TAKE these medications    ferrous sulfate 325 (65 FE) MG tablet Take 1 tablet (325 mg  total) by mouth 2 (two) times daily with a meal.   FreeStyle Libre 3 Sensor Misc 2 Devices by Does not apply route daily. Place 1 sensor on the skin every 14 days. Use to check glucose continuously   metFORMIN 500 MG 24 hr tablet Commonly known as: GLUCOPHAGE-XR Take 1 tablet (500 mg total) by mouth 2 (two) times daily with a meal.   traMADol 50 MG tablet Commonly known as: Ultram Take 1 tablet (50 mg total) by mouth every 6 (six) hours as needed for up to 5 days (postop pain not controlled with tylenol and ibuprofen first).   Trulicity 0.75 MG/0.5ML Sopn Generic drug: Dulaglutide Inject 0.75 mg into the skin once a week.   vitamin C 250 MG tablet Commonly known as: ASCORBIC ACID Take 1 tablet (250 mg total) by mouth 2 (two) times daily.          Follow-up Information     Andria Meuse, MD Follow up on 09/27/2023.   Specialties: General Surgery, Colon and Rectal Surgery Why: Please arrive by 9:15 am Contact information: [address] [place] [address] [phone]                 Stephanie Coup. Cliffton Asters, M.D. [facility]

## 2023-09-06 NOTE — Progress Notes (Signed)
Subjective No acute events. Feeling quite well. Does have some soreness at extraction but no pain elsewhere. Ate reg diet for lunch and dinner without any nausea whatsoever. Good appetite. No emesis. Denies any distention or bloating. Passing lots of flatus. Ambulating.  Objective: Vital signs in last 24 hours: Temp:  [98 F (36.7 C)-99.2 F (37.3 C)] 98.2 F (36.8 C) (10/09 0511) Pulse Rate:  [72-93] 93 (10/09 0511) Resp:  [18] 18 (10/09 0511) BP: (137-151)/(68-79) 139/79 (10/09 0511) SpO2:  [96 %-98 %] 97 % (10/09 0511) Last BM Date : 09/04/23  Intake/Output from previous day: 10/08 0701 - 10/09 0700 In: 1782.8 [P.O.:1680; I.V.:102.8] Out: 1000 [Urine:1000] Intake/Output this shift: No intake/output data recorded.  Gen: NAD, comfortable CV: RRR Pulm: Normal work of breathing Abd: Soft, appropriate tenderness, incision c/d/I without erythema nor drainage.  Ext: SCDs in place  Lab Results: CBC  Recent Labs    09/05/23 0437 09/06/23 0424  WBC 10.8* 9.3  HGB 9.8* 11.1*  HCT 29.2* 34.0*  PLT 165 143*   BMET Recent Labs    09/04/23 1257 09/05/23 0437  NA  --  135  K  --  4.0  CL  --  105  CO2  --  21*  GLUCOSE  --  106*  BUN  --  14  CREATININE 1.21 0.98  CALCIUM  --  8.5*   PT/INR No results for input(s): "LABPROT", "INR" in the last 72 hours. ABG No results for input(s): "PHART", "HCO3" in the last 72 hours.  Invalid input(s): "PCO2", "PO2"  Studies/Results:  Anti-infectives: Anti-infectives (From admission, onward)    Start     Dose/Rate Route Frequency Ordered Stop   09/04/23 1400  neomycin (MYCIFRADIN) tablet 1,000 mg  Status:  Discontinued       Placed in "And" Linked Group   1,000 mg Oral 3 times per day 09/04/23 0725 09/04/23 0728   09/04/23 1400  metroNIDAZOLE (FLAGYL) tablet 1,000 mg  Status:  Discontinued       Placed in "And" Linked Group   1,000 mg Oral 3 times per day 09/04/23 0725 09/04/23 0728   09/04/23 0730  cefoTEtan (CEFOTAN) 2  g in sodium chloride 0.9 % 100 mL IVPB        2 g 200 mL/hr over 30 Minutes Intravenous On call to O.R. 09/04/23 0725 09/04/23 1009        Assessment/Plan: Patient Active Problem List   Diagnosis Date Noted   S/P partial colectomy 09/04/2023   Family history of rectal cancer 07/01/2023   Abnormal colonoscopy 07/01/2023   Hx of adenomatous colonic polyps 07/01/2023   Prostate cancer (HCC) 04/29/2023   Adenomatous polyp of ascending colon 04/12/2023   Cecal polyp 04/12/2023   Adenomatous polyp of descending colon 04/12/2023   Adenomatous polyp of sigmoid colon 04/12/2023   Healthcare maintenance 03/16/2023   Pelvic abscess in male [facility]) 03/01/2023   Prostatitis 03/01/2023   Type 2 diabetes mellitus with hyperglycemia, without long-term current use of insulin (HCC) 02/15/2023   History of smoking greater than 50 pack years 02/14/2023   s/p Procedure(s): ROBOTIC PARTIAL COLECTOMY WITH MOBILIZATION OF THE SPLENIC FLEXURE FLEXIBLE SIGMOIDOSCOPY 09/04/2023  - Doing great - Continue diet as tolerated - Acute blood loss anemia on top of pre-existing suspected iron deficiency anemia - Having lots of flatus and 0 nausea. Good appetite. He would really like to go home today - PPX: SQH, SCDs - PATH pending  We discussed general postoperative expectations, restrictions, dietary recommendations and  things to watch out for. We have encouraged hime to call with any questions or concerns. We discussed his iron levels being low and have sent outpt Rx for iron +vit C. We have also advised he follow-up with PCP in the next month for recheck and long term monitoring of this. He expressed understanding and agreement with plan and wishes to go home today.    LOS: 2 days   Marin Olp, MD [facility]

## 2023-09-06 NOTE — Progress Notes (Signed)
Referral for medical oncology entered and patient scheduled for 09/19/23 at 1:40 with Dr Truett Perna

## 2023-09-06 NOTE — Plan of Care (Signed)
  Problem: Education: Goal: Understanding of discharge needs will improve Outcome: Progressing   Problem: Clinical Measurements: Goal: Postoperative complications will be avoided or minimized Outcome: Progressing

## 2023-09-06 NOTE — Progress Notes (Signed)
Oncology Discharge Planning Note  [facility] Address: [address], [place], [place] [address] Hours of Operation:  Lewayne Bunting, Monday - Friday  Clinic Contact Information:  [phone]) [phone]  Oncology Care Team: Medical Oncologist:  Truett Perna  Patient Details: Name:  Colin Griffin, Colin Griffin MRN:   [id number] DOB:   1951/07/03 Reason for Current Admission: @PPROB @  Discharge Planning Narrative: Notification of admission received by inpatient team for [facility]  Discharge follow-up appointments for oncology are current and available on the AVS and MyChart.   Upon discharge from the hospital, hematology/oncology's post discharge plan of care for the outpatient setting is:   September 19, 2023 at 1:40 pm [facility] [address] [place], [place] [address]  [phone]   Colin Griffin will be called within two business days after discharge to review hematology/oncology's plan of care for full understanding.    Outpatient Oncology Specific Care Only: Oncology appointment transportation needs addressed?:  no Oncology medication management for symptom management addressed?:  not applicable Chemo Alert Card reviewed?:  not applicable Immunotherapy Alert Card reviewed?:  not applicable

## 2023-09-08 ENCOUNTER — Encounter: Payer: Self-pay | Admitting: Acute Care

## 2023-09-12 ENCOUNTER — Encounter (HOSPITAL_COMMUNITY): Payer: Self-pay

## 2023-09-12 ENCOUNTER — Ambulatory Visit (HOSPITAL_COMMUNITY): Admit: 2023-09-12 | Payer: Medicare PPO | Admitting: Gastroenterology

## 2023-09-12 SURGERY — COLONOSCOPY WITH PROPOFOL
Anesthesia: Monitor Anesthesia Care

## 2023-09-13 ENCOUNTER — Other Ambulatory Visit: Payer: Self-pay

## 2023-09-13 NOTE — Progress Notes (Signed)
The proposed treatment discussed in conference is for discussion purpose only and is not a binding recommendation.  The patients have not been physically examined, or presented with their treatment options.  Therefore, final treatment plans cannot be decided.  

## 2023-09-14 ENCOUNTER — Telehealth: Payer: Self-pay | Admitting: Genetic Counselor

## 2023-09-14 ENCOUNTER — Encounter: Payer: Self-pay | Admitting: Genetic Counselor

## 2023-09-14 DIAGNOSIS — Z1379 Encounter for other screening for genetic and chromosomal anomalies: Secondary | ICD-10-CM | POA: Insufficient documentation

## 2023-09-14 NOTE — Telephone Encounter (Addendum)
I contacted Mr. Helinski to discuss his genetic testing results. No pathogenic variants were identified in the 71 genes analyzed. Detailed clinic note to follow.  The test report has been scanned into EPIC and is located under the Molecular Pathology section of the Results Review tab.  A portion of the result report is included below for reference.   Lalla Brothers, MS, [facility] Genetic Counselor [email or web address] (P) [phone]

## 2023-09-18 ENCOUNTER — Encounter (HOSPITAL_COMMUNITY): Payer: Self-pay

## 2023-09-18 ENCOUNTER — Encounter: Payer: Self-pay | Admitting: Genetic Counselor

## 2023-09-18 ENCOUNTER — Ambulatory Visit: Payer: Self-pay | Admitting: Genetic Counselor

## 2023-09-18 DIAGNOSIS — Z1379 Encounter for other screening for genetic and chromosomal anomalies: Secondary | ICD-10-CM

## 2023-09-18 NOTE — Progress Notes (Signed)
HPI:   Mr. Ure was previously seen in the [facility] due to a personal and family history of cancer, personal history of colon polyps, and concerns regarding a hereditary predisposition to cancer. Please refer to our prior cancer genetics clinic note for more information regarding our discussion, assessment and recommendations, at the time. Mr. Brotman recent genetic test results were disclosed to him, as were recommendations warranted by these results. These results and recommendations are discussed in more detail below.  FAMILY HISTORY:  We obtained a detailed, 4-generation family history.  Significant diagnoses are listed below:      Family History  Problem Relation Age of Onset   Atrial fibrillation Mother     Skin cancer Father          non-melanoma   Colon cancer Sister 11        Lynch Syndrome   Breast cancer Sister 81   Diabetes Brother     Throat cancer Maternal Uncle     Breast cancer Paternal Aunt 7 - 36   Colon cancer Maternal Grandmother 22   Alcohol abuse Maternal Grandfather     Breast cancer Paternal Grandmother 102   Prostate cancer Paternal Grandfather 41 - 57   Diabetes Other     Ovarian cancer Other 64        paternal first cousin once removed   Other Niece          2 nieces have Lynch Syndrome (brother's daughters)   Colon polyps Neg Hx     Stomach cancer Neg Hx     Esophageal cancer Neg Hx     Inflammatory bowel disease Neg Hx     Liver disease Neg Hx     Pancreatic cancer Neg Hx               Mr. Skau twin sister was diagnosed with breast cancer at age 22 and colon cancer at age 48, she reportedly has Lynch Syndrome. He reports 2 nieces (his brother's children) also have been diagnosed with Lynch Syndrome. We do not have family reports to confirm. Ms. Emert maternal grandmother was diagnosed with colon cancer at age 68, she died in her 12s due to metastatic colon cancer. His paternal aunt was diagnosed with breast cancer  in her 72s, she died at age 60. His paternal grandmother was diagnosed with breast cancer at age 31, she died at 25. His paternal grandfather was diagnosed with prostate cancer in his 99s, he died at age 22. Mr. Elbaz paternal first cousin once removed (great aunt's daughter) was diagnosed with ovarian cancer at age 59. There is no reported Ashkenazi Jewish ancestry.  GENETIC TEST RESULTS:  The [facility] CancerNext-Expanded Panel found no pathogenic mutations.  The CancerNext-Expanded gene panel offered by [facility] and includes sequencing, rearrangement, and RNA analysis for the following 71 genes: AIP, ALK, APC, ATM, AXIN2, BAP1, BARD1, BMPR1A, BRCA1, BRCA2, BRIP1, CDC73, CDH1, CDK4, CDKN1B, CDKN2A, CHEK2, CTNNA1, DICER1, FH, FLCN, KIF1B, LZTR1, MAX, MEN1, MET, MLH1, MSH2, MSH3, MSH6, MUTYH, NF1, NF2, NTHL1, PALB2, PHOX2B, PMS2, POT1, PRKAR1A, PTCH1, PTEN, RAD51C, RAD51D, RB1, RET, SDHA, SDHAF2, SDHB, SDHC, SDHD, SMAD4, SMARCA4, SMARCB1, SMARCE1, STK11, SUFU, TMEM127, TP53, TSC1, TSC2, and VHL (sequencing and deletion/duplication); EGFR, EGLN1, HOXB13, KIT, MITF, PDGFRA, POLD1, and POLE (sequencing only); EPCAM and GREM1 (deletion/duplication only).   The test report has been scanned into EPIC and is located under the Molecular Pathology section of the Results Review tab.  A portion of the  result report is included below for reference. Genetic testing reported out on 09/12/2023.       Even though a pathogenic variant was not identified, possible explanations for the cancer in the family and personal history of colon polyps may include: There may be no hereditary risk for cancer/polyposis in the family. The cancers/colon polyps in Mr. Sprauer and/or his family may be due to other genetic or environmental factors. There may be a gene mutation in one of these genes that current testing methods cannot detect, but that chance is small. There could be another gene that has not yet been discovered,  or that we have not yet tested, that is responsible for the colon polyps/cancer diagnoses in the family.  It is also possible there is a hereditary cause for the cancer in the family that Mr. Moebius did not inherit. Mr. Gwynn reports a family history of Lynch Syndrome. Therefore, this is likely the cause of the family history of cancer.  Therefore, it is important to remain in touch with cancer genetics in the future so that we can continue to offer Mr. Nguyenthi the most up to date genetic testing.   ADDITIONAL GENETIC TESTING:  We discussed with Mr. Kope that his genetic testing was fairly extensive.  If there are genes identified to increase cancer risk that can be analyzed in the future, we would be happy to discuss and coordinate this testing at that time.    CANCER SCREENING RECOMMENDATIONS:  Mr. Hocevar test result is considered negative (normal).  This means that we have not identified a hereditary cause for his personal history of colon polyps and cancer at this time.   An individual's cancer risk and medical management are not determined by genetic test results alone. Overall cancer risk assessment incorporates additional factors, including personal medical history, family history, and any available genetic information that may result in a personalized plan for cancer prevention and surveillance. Therefore, it is recommended he continue to follow the cancer management and screening guidelines provided by his oncology and primary healthcare provider.  RECOMMENDATIONS FOR FAMILY MEMBERS:   Since he did not inherit a mutation in a cancer predisposition gene included on this panel, his son could not have inherited a mutation from him in one of these genes. Other members of the family may still carry a pathogenic variant in one of these genes that Mr. Fine did not inherit. Based on the family history, we recommend his relatives have genetic counseling and testing, because he reports a family  history of Lynch Syndrome.  FOLLOW-UP:  Cancer genetics is a rapidly advancing field and it is possible that new genetic tests will be appropriate for him and/or his family members in the future. We encouraged him to remain in contact with cancer genetics on an annual basis so we can update his personal and family histories and let him know of advances in cancer genetics that may benefit this family.   Our contact number was provided. Mr. Wittbrodt questions were answered to his satisfaction, and he knows he is welcome to call us at anytime with additional questions or concerns.   Lalla Brothers, MS, [facility] Genetic Counselor [email or web address] (P) [phone]

## 2023-09-19 ENCOUNTER — Encounter: Payer: Self-pay | Admitting: *Deleted

## 2023-09-19 ENCOUNTER — Inpatient Hospital Stay: Payer: Medicare PPO | Admitting: Oncology

## 2023-09-19 VITALS — BP 125/70 | HR 89 | Temp 98.2°F | Resp 18 | Ht 73.0 in | Wt 165.7 lb

## 2023-09-19 DIAGNOSIS — C186 Malignant neoplasm of descending colon: Secondary | ICD-10-CM

## 2023-09-19 NOTE — Progress Notes (Signed)
PATIENT NAVIGATOR PROGRESS NOTE  Name: Cornelio Muncey Date: 09/19/2023 MRN: [id number]  DOB: 1951-10-30   Reason for visit: New Patient appt  Comments:  Met with Mr Gerard during new pt appt with Dr Truett Perna  Message sent to Dr Cliffton Asters for [facility] placement for adjuvant chemo Mr Yanagi would like to have treatment at [facility] due to distance, message sent to GI nurse navigator at [facility] to facilitate transfer.  Pt will be set up to see Dr Donneta Romberg on 11/4 Given written information on FOLFOX therapy per Dr Truett Perna recommendation Given contact number to call with any questions    Time spent counseling/coordinating care: > 60 minutes

## 2023-09-19 NOTE — Progress Notes (Signed)
[facility] New Patient Consult   Requesting MD: Ronnald Ramp, Md [address] [place],  [place] [address]   Colin Griffin 72 y.o.  1951/01/26    Reason for Consult: Colon cancer   HPI: Colin Griffin reports feeling well until March of this year when he was diagnosed with a urinary tract infection and diabetes.  He was subsequently found to have urinary retention and a prostate/pelvic abscess.  He was admitted for antibiotics and drainage of the pelvic abscess 03/01/2023. A TUR and was found to have a Gleason 7 prostate cancer involving 5-10% of the submitted tissue.  He was referred to Dr. Rushie Chestnut and completed external beam radiation to the prostate.  Colin Griffin underwent a screening colonoscopy 04/12/2023.  A greater than 50 mm polyp was removed from the proximal ascending colon.  Additional greater than 50 m polyps were not removed from the distal ascending and sigmoid colon.  Additional smaller polyps were removed from the colon. The pathology revealed multiple fragments of tubular adenomas.  He was referred to Dr. [name] for removal of the large colon polyps.  Greater than 50 mm polyps were removed from the ascending, transverse, and descending colon.  A polypoid lesion at 35 cm was incompletely resected and tattooed.  The pathology from multiple polyps returned as tubular adenomas without high-grade dysplasia or carcinoma.  The sigmoid lesion returned as an ulcerated tubular adenoma with at least high-grade dysplasia and suspicion for invasive carcinoma. Colin Griffin was referred to Dr. Cliffton Asters and underwent a robotic assisted partial colectomy on 09/04/2023.  A lesion was located proximal to the descending colon tattoo.  A partial colectomy with removal of the abnormal lesion and associated lymph nodes was performed.  There was no evidence of metastatic disease. The pathology from the sigmoid resection returned as invasive moderately  differentiated adenocarcinoma involving the descending colon.  Carcinoma invades into but not through the muscularis propria.  The resection margins are negative.  Lymphovascular invasion is present.  Metastatic carcinoma was identified in 3 of 11 lymph nodes and there were 2 tumor deposits.  Mismatch repair protein expression is intact.  Colin Griffin has recovered from surgery.  His bowels are functioning.  He feels well at present.    Past Medical History:  Diagnosis Date   Cancer (HCC)-prostate, treated with external beam radiation completed 07/20/2023 April 2024   Constipation 03/04/2023   Diabetes mellitus without complication [facility] [place] [facility]) March 2024    .  States/pelvic abscess April 2024  Past Surgical History:  Procedure Laterality Date   APPENDECTOMY     BIOPSY  06/29/2023   Procedure: BIOPSY;  Surgeon: [name] Netty Starring., MD;  Location: [facility];  Service: Gastroenterology;;   COLONOSCOPY WITH PROPOFOL N/A 04/12/2023   Procedure: COLONOSCOPY WITH PROPOFOL;  Surgeon: [name], MD;  Location: [facility] ENDOSCOPY;  Service: Gastroenterology;  Laterality: N/A;   COLONOSCOPY WITH PROPOFOL N/A 06/29/2023   Procedure: COLONOSCOPY WITH PROPOFOL;  Surgeon: [name] Netty Starring., MD;  Location: [facility] ENDOSCOPY;  Service: Gastroenterology;  Laterality: N/A;   ENDOSCOPIC MUCOSAL RESECTION N/A 06/29/2023   Procedure: ENDOSCOPIC MUCOSAL RESECTION;  Surgeon: [name] Netty Starring., MD;  Location: [facility] ENDOSCOPY;  Service: Gastroenterology;  Laterality: N/A;   FLEXIBLE SIGMOIDOSCOPY N/A 09/04/2023   Procedure: FLEXIBLE SIGMOIDOSCOPY;  Surgeon: Andria Meuse, MD;  Location: [facility] ORS;  Service: General;  Laterality: N/A;   HEMOSTASIS CONTROL  06/29/2023   Procedure: HEMOSTASIS CONTROL;  Surgeon: Lemar Lofty., MD;  Location: [facility] ENDOSCOPY;  Service: Gastroenterology;;   IR RADIOLOGIST EVAL & MGMT  03/16/2023   PELVIC ABCESS DRAINAGE     March 2024   POLYPECTOMY  06/29/2023   Procedure:  POLYPECTOMY;  Surgeon: Mansouraty, Netty Starring., MD;  Location: [facility];  Service: Gastroenterology;;   Sunnie Nielsen LIFTING INJECTION  06/29/2023   Procedure: SUBMUCOSAL LIFTING INJECTION;  Surgeon: Lemar Lofty., MD;  Location: [facility];  Service: Gastroenterology;;   SUBMUCOSAL TATTOO INJECTION  06/29/2023   Procedure: SUBMUCOSAL TATTOO INJECTION;  Surgeon: Lemar Lofty., MD;  Location: [facility];  Service: Gastroenterology;;   TRANSURETHRAL RESECTION OF PROSTATE N/A 03/03/2023   Procedure: TRANSURETHRAL RESECTION OF THE PROSTATE (TURP)/ UNROOFING OF PROSTATE ABSCESS;  Surgeon: Riki Altes, MD;  Location: [facility] ORS;  Service: Urology;  Laterality: N/A;    Medications: Reviewed  Allergies: No Known Allergies  Family history: Sister with breast and colon cancer, maternal grandmother with colon cancer, paternal great grandmother and any of her children had colon cancer  Social History:   He lives with his wife in [place].  He is retired Location manager, he has smoked 1 pack of cigarettes per day for greater than 50 years.  No alcohol use.  No transfusion history.  No risk factor for HIV or hepatitis.  ROS:   Positives include: 40 pound weight loss, decreased near vision  A complete ROS was otherwise negative.  Physical Exam:  Blood pressure 125/70, pulse 89, temperature 98.2 F (36.8 C), temperature source Oral, resp. rate 18, height 6\' 1"  (1.854 m), weight 165 lb 11.2 oz (75.2 kg), SpO2 99%.  HEENT: Oropharynx without visible mass, neck without mass Lungs: Clear bilaterally Cardiac: Regular rate and rhythm Abdomen: No hepatosplenomegaly, healed surgical incisions GU: Testes without mass Vascular: No leg edema Lymph nodes: No cervical, supraclavicular, axillary, or inguinal nodes Neurologic: Alert and oriented, the motor exam appears intact in the upper and lower extremities bilaterally Skin: No rash Musculoskeletal: No spine  tenderness   LAB:  CBC  Lab Results  Component Value Date   WBC 9.3 09/06/2023   HGB 11.1 (L) 09/06/2023   HCT 34.0 (L) 09/06/2023   MCV 99.4 09/06/2023   PLT 143 (L) 09/06/2023   NEUTROABS 5.5 07/06/2023        CMP  Lab Results  Component Value Date   NA 135 09/05/2023   K 4.0 09/05/2023   CL 105 09/05/2023   CO2 21 (L) 09/05/2023   GLUCOSE 106 (H) 09/05/2023   BUN 14 09/05/2023   CREATININE 0.98 09/05/2023   CALCIUM 8.5 (L) 09/05/2023   PROT 6.9 07/06/2023   ALBUMIN 3.9 07/06/2023   AST 17 07/06/2023   ALT 21 07/06/2023   ALKPHOS 56 07/06/2023   BILITOT 0.2 (L) 07/06/2023   GFRNONAA >60 09/05/2023     Lab Results  Component Value Date   CEA1 7.9 (H) 07/06/2023    Imaging:  No results found.    Assessment/Plan:   Colon cancer, status post a partial left colectomy 09/04/2023, stage IIIa (pT2 pN1b) CT abdomen/pelvis 03/01/2023-prostate/pelvic abscess, large amount of colonic stool CT chest 03/30/2023-bilateral solid pulmonary nodules-likely benign, 48-month follow-up CT recommended Colonoscopy 04/12/2023-multiple polyps removed, greater than 50 mm polyp in the distal ascending colon and sigmoid colon not removed Colonoscopy 06/29/2023 greater than 50 mm polyps removed from the ascending, transverse, and descending colon-tubular adenomas, sigmoid lesion at 35 cm incompletely resected with biopsy revealing an ulcerated tubular adenoma with at least high-grade dysplasia and suspicious for invasive or [place] Descending colon  resection 09/04/2023, pT2, PN 1B moderately differentiated adenocarcinoma, negative resection margins, lymphovascular invasion present, 3/11 lymph nodes, 2 tumor deposits, mismatch repair protein expression intact, MSS  Multiple colon polyps on colonoscopy 04/12/2023 and 06/29/2023-tubular adenomas Prostate cancer-Gleason 7 acinar adenocarcinoma involving 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  Diabetes 5.  History of colon cancer-negative CancerNext expanded panel 08/29/2023 6.  History of lung nodules, ongoing tobacco use, followed in the lung cancer screening clinic   Disposition:   Colin Griffin has been diagnosed with stage III colon cancer.  I reviewed details of the surgical pathology report, the prognosis, and adjuvant systemic treatment options with him today.  There is a significant chance of developing recurrent disease over the next few years based on the multiple positive lymph nodes, limited number of lymph nodes sampled, lymphovascular invasion, and tumor deposits.  I recommend adjuvant 5-fluorouracil based chemotherapy.  We discussed the expected benefit of adjuvant 5-fluorouracil and the addition of oxaliplatin.  He is technically a candidate for 3 months of adjuvant chemotherapy, but based on the high risk features above I recommend 6 months of adjuvant therapy.  We discussed CAPOX and FOLFOX.  I recommend FOLFOX.  He is in agreement.  We reviewed potential toxicities associated with the FOLFOX regimen including the chance of nausea/vomiting, mucositis, diarrhea, alopecia, hematologic toxicity, infection, and bleeding.  We discussed the sun sensitivity, rash, hyperpigmentation, hand/foot syndrome, and cardiac toxicity associated with 5-fluorouracil.  We discussed the allergic reaction and various types of neuropathy seen with oxaliplatin.  He understands he he may have an increased risk of neuropathy with the recent diagnosis of diabetes.  He agrees to proceed.  Colin Griffin understands the need for Port-A-Cath placement in order to administer FOLFOX.  We will refer him to Dr. Cliffton Asters for Port-A-Cath  placement.  He does not appear to have hereditary nonpolyposis colon cancer syndrome based on the mismatch repair protein and microsatellite instability testing.  He also had negative genetic testing.  His family members are at increased risk of developing colorectal cancer and should receive appropriate screening.  Colin Griffin should continue surveillance imaging for colon cancer and follow-up of lung nodules in the lung cancer screening clinic.  He will continue colonoscopy surveillance with gastroenterology and prostate cancer follow-up with radiation oncology.  He indicated his preference to receive adjuvant chemotherapy closer to home.  I will refer him to Dr. Donneta Romberg at the [facility] cancer center.  I am available to see him in the future as needed.    Thornton Papas, MD  09/19/2023, 2:12 PM

## 2023-09-20 ENCOUNTER — Telehealth: Payer: Self-pay

## 2023-09-20 ENCOUNTER — Other Ambulatory Visit: Payer: Self-pay | Admitting: *Deleted

## 2023-09-20 DIAGNOSIS — C186 Malignant neoplasm of descending colon: Secondary | ICD-10-CM

## 2023-09-20 NOTE — Telephone Encounter (Signed)
Appointment arranged with Dr. Donneta Romberg 10/02/2023 at 1100. Colin Griffin is a current patient at the cancer center under the care of Dr. Rushie Chestnut prostate cancer surveillance and is aware of our location and policies.

## 2023-09-20 NOTE — Progress Notes (Signed)
Order placed for IR to place port

## 2023-09-21 ENCOUNTER — Telehealth: Payer: Self-pay

## 2023-09-21 NOTE — Telephone Encounter (Signed)
Port a cath placement scheduled for September 26, 2023  Arrive at 1200  for 1300 appointment Come into the Heart and Vascular entrance at [facility]. This entrance is located in the front of the hospital. Do not eat or drink anything after midnight Take your regularly scheduled blood pressure, pain, or seizure medication. You do not need to hold you blood thinners. You will need a driver for this procedure.   Called and reviewed instructions with Colin Griffin. All questions answered. He would also like to go ahead and have his chemo teach. This will be arranged for 09/30/23 at 0900.

## 2023-09-25 NOTE — Progress Notes (Signed)
Patient for IR Port Placement on Tues 09/26/2023, I called and spoke with the patient on the phone and gave pre-procedure instructions. Pt was made aware to be here at 12p, NPO after MN prior to procedure as well as driver post procedure/recovery/discharge. Pt stated understanding.  Called 09/26/2023

## 2023-09-26 ENCOUNTER — Encounter: Payer: Self-pay | Admitting: Radiology

## 2023-09-26 ENCOUNTER — Ambulatory Visit
Admission: RE | Admit: 2023-09-26 | Discharge: 2023-09-26 | Disposition: A | Discharge status: Home / Self Care | Payer: Medicare PPO | Source: Ambulatory Visit | Attending: Oncology | Admitting: Oncology

## 2023-09-26 ENCOUNTER — Other Ambulatory Visit (HOSPITAL_COMMUNITY): Payer: Self-pay | Admitting: Student

## 2023-09-26 DIAGNOSIS — F17210 Nicotine dependence, cigarettes, uncomplicated: Secondary | ICD-10-CM | POA: Insufficient documentation

## 2023-09-26 DIAGNOSIS — E119 Type 2 diabetes mellitus without complications: Secondary | ICD-10-CM | POA: Insufficient documentation

## 2023-09-26 DIAGNOSIS — C19 Malignant neoplasm of rectosigmoid junction: Secondary | ICD-10-CM | POA: Insufficient documentation

## 2023-09-26 DIAGNOSIS — Z7984 Long term (current) use of oral hypoglycemic drugs: Secondary | ICD-10-CM | POA: Diagnosis not present

## 2023-09-26 DIAGNOSIS — Z8546 Personal history of malignant neoplasm of prostate: Secondary | ICD-10-CM | POA: Diagnosis not present

## 2023-09-26 DIAGNOSIS — C186 Malignant neoplasm of descending colon: Secondary | ICD-10-CM

## 2023-09-26 DIAGNOSIS — Z7985 Long-term (current) use of injectable non-insulin antidiabetic drugs: Secondary | ICD-10-CM | POA: Diagnosis not present

## 2023-09-26 HISTORY — PX: IR IMAGING GUIDED PORT INSERTION: IMG5740

## 2023-09-26 LAB — GLUCOSE, CAPILLARY: Glucose-Capillary: 150 mg/dL — ABNORMAL HIGH (ref 70–99)

## 2023-09-26 MED ORDER — SODIUM CHLORIDE 0.9% FLUSH: 3.0000 mL | Freq: Two times a day (BID) | INTRAVENOUS | Status: DC

## 2023-09-26 MED ORDER — FENTANYL CITRATE (PF) 100 MCG/2ML IJ SOLN
INTRAMUSCULAR | Status: AC | PRN
  Administered 2023-09-26 (×2): 50 ug via INTRAVENOUS

## 2023-09-26 MED ORDER — MIDAZOLAM HCL 5 MG/5ML IJ SOLN
INTRAMUSCULAR | Status: AC | PRN
Start: 2023-09-26 — End: 2023-09-26
  Administered 2023-09-26: .5 mg via INTRAVENOUS
  Administered 2023-09-26: 1 mg via INTRAVENOUS
  Administered 2023-09-26: .5 mg via INTRAVENOUS

## 2023-09-26 MED ORDER — LIDOCAINE HCL 1 % IJ SOLN
16.0000 mL | Freq: Once | INTRAMUSCULAR | Status: AC
  Administered 2023-09-26: 16 mL via INTRADERMAL

## 2023-09-26 MED ORDER — LIDOCAINE HCL 1 % IJ SOLN
INTRAMUSCULAR | Status: AC
  Filled 2023-09-26: qty 20

## 2023-09-26 MED ORDER — FENTANYL CITRATE (PF) 100 MCG/2ML IJ SOLN
INTRAMUSCULAR | Status: AC
  Filled 2023-09-26: qty 2

## 2023-09-26 MED ORDER — HEPARIN SOD (PORK) LOCK FLUSH 100 UNIT/ML IV SOLN
INTRAVENOUS | Status: AC
  Filled 2023-09-26: qty 5

## 2023-09-26 MED ORDER — MIDAZOLAM HCL 2 MG/2ML IJ SOLN
INTRAMUSCULAR | Status: AC
  Filled 2023-09-26: qty 2

## 2023-09-26 MED ORDER — SODIUM CHLORIDE 0.9 % IV SOLN
INTRAVENOUS | Status: DC
  Administered 2023-09-26: 250 mL via INTRAVENOUS

## 2023-09-26 MED ORDER — HEPARIN SOD (PORK) LOCK FLUSH 100 UNIT/ML IV SOLN
500.0000 [IU] | Freq: Once | INTRAVENOUS | Status: AC
  Administered 2023-09-26: 500 [IU] via INTRAVENOUS

## 2023-09-26 NOTE — Procedures (Signed)
Interventional Radiology Procedure Note  Procedure: Single Lumen Power Port Placement    Access:  Right IJ vein.  Findings: Catheter tip positioned at SVC/RA junction. Port is ready for immediate use.   Complications: None  EBL: < 10 mL  Recommendations:  - Ok to shower in 24 hours - Do not submerge for 7 days - Routine line care   Lashauna Arpin T. Jacobs Golab, M.D Pager:  [phone]   

## 2023-09-26 NOTE — Discharge Instructions (Signed)
Implanted Port Home Guide  An implanted port is a type of central line that is placed under the skin. Central lines are used to provide IV access when treatment or nutrition needs to be given through a person's veins. Implanted ports are used for long-term IV access. An implanted port may be placed because: You need IV medicine that would be irritating to the small veins in your hands or arms. You need long-term IV medicines, such as antibiotics. You need IV nutrition for a long period. You need frequent blood draws for lab tests. You need dialysis.   Implanted ports are usually placed in the chest area, but they can also be placed in the upper arm, the abdomen, or the leg. An implanted port has two main parts: Reservoir. The reservoir is round and will appear as a small, raised area under your skin. The reservoir is the part where a needle is inserted to give medicines or draw blood. Catheter. The catheter is a thin, flexible tube that extends from the reservoir. The catheter is placed into a large vein. Medicine that is inserted into the reservoir goes into the catheter and then into the vein.   How will I care for my incision  You may shower tomorrow Please remove dressing in 24hrs not other skin care is needed  How is my port accessed? Special steps must be taken to access the port: Before the port is accessed, a numbing cream can be placed on the skin. This helps numb the skin over the port site. Your health care provider uses a sterile technique to access the port. Your health care provider must put on a mask and sterile gloves. The skin over your port is cleaned carefully with an antiseptic and allowed to dry. The port is gently pinched between sterile gloves, and a needle is inserted into the port. Only "non-coring" port needles should be used to access the port. Once the port is accessed, a blood return should be checked. This helps ensure that the port is in the vein and is not  clogged. If your port needs to remain accessed for a constant infusion, a clear (transparent) bandage will be placed over the needle site. The bandage and needle will need to be changed every week, or as directed by your health care provider.   What is flushing? Flushing helps keep the port from getting clogged. Follow your health care provider's instructions on how and when to flush the port. Ports are usually flushed with saline solution or a medicine called heparin. The need for flushing will depend on how the port is used. If the port is used for intermittent medicines or blood draws, the port will need to be flushed: After medicines have been given. After blood has been drawn. As part of routine maintenance. If a constant infusion is running, the port may not need to be flushed.   How long will my port stay implanted? The port can stay in for as long as your health care provider thinks it is needed. When it is time for the port to come out, surgery will be done to remove it. The procedure is similar to the one performed when the port was put in. When should I seek immediate medical care? When you have an implanted port, you should seek immediate medical care if: You notice a bad smell coming from the incision site. You have swelling, redness, or drainage at the incision site. You have more swelling or pain at the   port site or the surrounding area. You have a fever that is not controlled with medicine.   This information is not intended to replace advice given to you by your health care provider. Make sure you discuss any questions you have with your health care provider. Document Released: 11/14/2005 Document Revised: 04/21/2016 Document Reviewed: 07/22/2013 Elsevier Interactive Patient Education  2017 [employer]   

## 2023-09-26 NOTE — Progress Notes (Signed)
Patient clinically stable post IR Port placement per Dr Fredia Sorrow, tolerated well. Vitals stable pre and post procedure. Received Versed 2 mg along with Fentanyl 100 mcg IV for procedure. Report given to Talbert Forest RN post procedure/specials/14.

## 2023-09-26 NOTE — Consult Note (Signed)
Chief Complaint: Patient was seen in consultation today for cancer of descending colon at the request of Dr Reece Agar. [place]  Supervising Physician: Irish Lack  Patient Status: [facility] - Out-pt  History of Present Illness: Colin Griffin is a 72 y.o. male with a medical history of DM. On 4/ 3/ 24  was diagnosed with prostate cancer and underwent a TURP procedure and radiation treatment.  On 5 / 5 / 24 a colonoscopy revealed a mass in his sigmoid colon, biopsy revealed they were malignant. Dr. Larena Sox made a request to IR for Port-A Catheter placement for chemotherapy treatment.  Past Medical History:  Diagnosis Date   Cancer [facility]) April 2024   Constipation 03/04/2023   Diabetes mellitus without complication [facility]) March 2024    Past Surgical History:  Procedure Laterality Date   APPENDECTOMY     BIOPSY  06/29/2023   Procedure: BIOPSY;  Surgeon: Lemar Lofty., MD;  Location: [facility];  Service: Gastroenterology;;   COLONOSCOPY WITH PROPOFOL N/A 04/12/2023   Procedure: COLONOSCOPY WITH PROPOFOL;  Surgeon: [name], MD;  Location: [facility] ENDOSCOPY;  Service: Gastroenterology;  Laterality: N/A;   COLONOSCOPY WITH PROPOFOL N/A 06/29/2023   Procedure: COLONOSCOPY WITH PROPOFOL;  Surgeon: [name] Netty Starring., MD;  Location: [facility] ENDOSCOPY;  Service: Gastroenterology;  Laterality: N/A;   ENDOSCOPIC MUCOSAL RESECTION N/A 06/29/2023   Procedure: ENDOSCOPIC MUCOSAL RESECTION;  Surgeon: [name] Netty Starring., MD;  Location: [facility] ENDOSCOPY;  Service: Gastroenterology;  Laterality: N/A;   FLEXIBLE SIGMOIDOSCOPY N/A 09/04/2023   Procedure: FLEXIBLE SIGMOIDOSCOPY;  Surgeon: Andria Meuse, MD;  Location: [facility] ORS;  Service: General;  Laterality: N/A;   HEMOSTASIS CONTROL  06/29/2023   Procedure: HEMOSTASIS CONTROL;  Surgeon: Lemar Lofty., MD;  Location: [facility] ENDOSCOPY;  Service: Gastroenterology;;   IR RADIOLOGIST EVAL & MGMT  03/16/2023   PELVIC ABCESS  DRAINAGE     March 2024   POLYPECTOMY  06/29/2023   Procedure: POLYPECTOMY;  Surgeon: [name] Netty Starring., MD;  Location: [facility]OS[facility];  Service: Gastroenterology;;   Sunnie Nielsen LIFTING INJECTION  06/29/2023   Procedure: SUBMUCOSAL LIFTING INJECTION;  Surgeon: Lemar Lofty., MD;  Location: [facility]OSCOPY;  Service: Gastroenterology;;   SUBMUCOSAL TATTOO INJECTION  06/29/2023   Procedure: SUBMUCOSAL TATTOO INJECTION;  Surgeon: Lemar Lofty., MD;  Location: [facility];  Service: Gastroenterology;;   TRANSURETHRAL RESECTION OF PROSTATE N/A 03/03/2023   Procedure: TRANSURETHRAL RESECTION OF THE PROSTATE (TURP)/ UNROOFING OF PROSTATE ABSCESS;  Surgeon: Riki Altes, MD;  Location: [facility] ORS;  Service: Urology;  Laterality: N/A;    Allergies: Patient has no known allergies.  Medications: Prior to Admission medications   Medication Sig Start Date End Date Taking? Authorizing Provider  ascorbic acid (VITAMIN C) 500 MG tablet Take 0.5 tablets (250 mg total) by mouth 2 (two) times daily. 09/06/23 10/06/23  Andria Meuse, MD  Continuous Blood Gluc Sensor (FREESTYLE LIBRE 3 SENSOR) MISC 2 Devices by Does not apply route daily. Place 1 sensor on the skin every 14 days. Use to check glucose continuously 03/09/23   Simmons-Robinson, Makiera, MD  Dulaglutide (TRULICITY) 0.75 MG/0.5ML SOPN Inject 0.75 mg into the skin once a week. 02/20/23   Simmons-Robinson, Tawanna Cooler, MD  ferrous sulfate 325 (65 FE) MG tablet Take 1 tablet (325 mg total) by mouth 2 (two) times daily with a meal. 09/06/23 10/06/23  Andria Meuse, MD  metFORMIN (GLUCOPHAGE-XR) 500 MG 24 hr tablet Take 1 tablet (500 mg total) by mouth 2 (two) times daily  with a meal. 04/28/23   Simmons-Robinson, Tawanna Cooler, MD     Family History  Problem Relation Age of Onset   Atrial fibrillation Mother    Skin cancer Father        non-melanoma   Colon cancer Sister 21       Lynch Syndrome   Breast cancer Sister 59   Diabetes  Brother    Throat cancer Maternal Uncle    Breast cancer Paternal Aunt 33 - 36   Colon cancer Maternal Grandmother 35   Alcohol abuse Maternal Grandfather    Breast cancer Paternal Grandmother 24   Prostate cancer Paternal Grandfather 94 - 78   Diabetes Other    Ovarian cancer Other 27       paternal first cousin once removed   Other Niece        2 nieces have Lynch Syndrome (brother's daughters)   Colon polyps Neg Hx    Stomach cancer Neg Hx    Esophageal cancer Neg Hx    Inflammatory bowel disease Neg Hx    Liver disease Neg Hx    Pancreatic cancer Neg Hx     Social History   Socioeconomic History   Marital status: Married    Spouse name: Editor, commissioning   Number of children: 1   Years of education: Not on file   Highest education level: Master's degree (e.g., MA, MS, MEng, MEd, MSW, MBA)  Occupational History   Not on file  Tobacco Use   Smoking status: Every Day    Current packs/day: 1.00    Average packs/day: 1 pack/day for 54.0 years (54.0 ttl pk-yrs)    Types: Cigarettes   Smokeless tobacco: Never   Tobacco comments:    Reports that at one time he smoked 3 packs per day while in the [employer]  Vaping Use   Vaping status: Every Day   Start date: 08/08/2022   Substances: Nicotine  Substance and Sexual Activity   Alcohol use: Never   Drug use: Never   Sexual activity: Not Currently    Birth control/protection: None  Other Topics Concern   Not on file  Social History Narrative   ** Merged History Encounter **       Retired from Agricultural consultant radiology for 30 years    Social Determinants of Corporate investment banker Strain: Low Risk  (05/01/2023)   Overall Financial Resource Strain (CARDIA)    Difficulty of Paying Living Expenses: Not hard at all  Food Insecurity: No Food Insecurity (09/19/2023)   Hunger Vital Sign    Worried About Running Out of Food in the Last Year: Never true    Ran Out of Food in the Last Year: Never true  Transportation Needs: No  Transportation Needs (09/19/2023)   PRAPARE - Administrator, Civil Service (Medical): No    Lack of Transportation (Non-Medical): No  Physical Activity: Sufficiently Active (05/01/2023)   Exercise Vital Sign    Days of Exercise per Week: 6 days    Minutes of Exercise per Session: 150+ min  Stress: No Stress Concern Present (05/01/2023)   [employer] of Occupational Health - Occupational Stress Questionnaire    Feeling of Stress : Not at all  Social Connections: Moderately Integrated (05/01/2023)   Social Connection and Isolation Panel [NHANES]    Frequency of Communication with Friends and Family: Three times a week    Frequency of Social Gatherings with Friends and Family: Twice a week    Attends Religious  Services: More than 4 times per year    Active Member of Clubs or Organizations: No    Attends Banker Meetings: Patient declined    Marital Status: Married     Review of Systems: A 12 point ROS discussed and pertinent positives are indicated in the HPI above.  All other systems are negative.  Review of Systems  Constitutional:  Negative for activity change, appetite change, chills, fatigue and fever.  Respiratory:  Negative for chest tightness and shortness of breath.   Cardiovascular:  Negative for chest pain and leg swelling.  Gastrointestinal:  Negative for abdominal pain.  Skin:  Negative for color change and pallor.  Neurological:  Negative for speech difficulty and headaches.  Psychiatric/Behavioral:  Negative for behavioral problems and confusion.     Advance Care Plan: None on file  Full code per patient   Physical Exam Constitutional:      Appearance: Normal appearance. He is not ill-appearing.  HENT:     Mouth/Throat:     Mouth: Mucous membranes are moist.  Cardiovascular:     Rate and Rhythm: Normal rate and regular rhythm.     Pulses: Normal pulses.     Heart sounds: Normal heart sounds.  Pulmonary:     Effort: Pulmonary  effort is normal.     Breath sounds: Normal breath sounds. No wheezing.  Abdominal:     General: Bowel sounds are normal.     Palpations: Abdomen is soft.     Tenderness: There is no abdominal tenderness.     Comments: Healing midline abdominal incision with skin glue  Musculoskeletal:     Right lower leg: No edema.     Left lower leg: No edema.  Skin:    General: Skin is warm and dry.  Neurological:     Mental Status: He is alert and oriented to person, place, and time.  Psychiatric:        Mood and Affect: Mood normal.        Behavior: Behavior normal.     Labs:  CBC: Recent Labs    08/29/23 1035 09/04/23 1257 09/05/23 0437 09/06/23 0424  WBC 7.0 9.4 10.8* 9.3  HGB 11.9* 12.1* 9.8* 11.1*  HCT 36.4* 36.0* 29.2* 34.0*  PLT 192 217 165 143*    COAGS: Recent Labs    03/02/23 0504 07/06/23 0823  INR 1.2 0.9    BMP: Recent Labs    03/22/23 1030 07/06/23 0823 08/29/23 1035 09/04/23 1257 09/05/23 0437  NA 138 135 137  --  135  K 4.1 4.4 4.6  --  4.0  CL 105 106 113*  --  105  CO2 17* 22 21*  --  21*  GLUCOSE 149* 181* 109*  --  106*  BUN 24 25* 21  --  14  CALCIUM 9.2 9.1 9.2  --  8.5*  CREATININE 1.05 1.04 1.09 1.21 0.98  GFRNONAA  --  >60 >60 >60 >60    LIVER FUNCTION TESTS: Recent Labs    03/04/23 0455 03/05/23 0443 03/06/23 0342 07/06/23 0823  BILITOT 0.6 0.7 0.5 0.2*  AST 46* 53* 40 17  ALT 65* 76* 65* 21  ALKPHOS 167* 155* 148* 56  PROT 6.5 6.4* 6.9 6.9  ALBUMIN 2.4* 2.6* 2.7* 3.9   ... Vitals:   09/26/23 1218  BP: 137/80  Pulse: (!) 58  Resp: 15  Temp: 98.4 F (36.9 C)  SpO2: 99%     Assessment and Plan:  Colin Griffin  is a 72 y.o. male with a medical history of DM. On 4/ 3 / 24  was diagnosed with prostate cancer and underwent a TURP procedure and radiation treatment.  On 5 / 5/  24 a colonoscopy revealed a mass in his sigmoid colon, biopsy revealed they were malignant. Dr Reece Agar. [place] made a request to IR for Port-A  Catheter placement for chemotherapy treatment. Dr. Sarita Haver reviewed case and agreed to perform procedure in IR on 10/29.  Patient has been NPO since midnight, is afebrile and in no apparent distress.  Risks and benefits of image guided port-a-catheter placement was discussed with the patient including, but not limited to bleeding, infection, pneumothorax, or fibrin sheath development and need for additional procedures.  All of the patient's questions were answered, patient is agreeable to proceed. Consent signed and in chart.  Thank you for this interesting consult.  I greatly enjoyed meeting Hipolito Gene Marz and look forward to participating in their care.  A copy of this report was sent to the requesting provider on this date.  Electronically Signed: Ardith Dark, NP 09/26/2023, 9:32 AM   I spent a total of  30 Minutes   in face to face in clinical consultation, greater than 50% of which was counseling/coordinating care for port-a-catheter

## 2023-09-29 ENCOUNTER — Inpatient Hospital Stay: Payer: Medicare PPO | Attending: Oncology

## 2023-09-29 DIAGNOSIS — C186 Malignant neoplasm of descending colon: Secondary | ICD-10-CM | POA: Insufficient documentation

## 2023-09-29 DIAGNOSIS — R918 Other nonspecific abnormal finding of lung field: Secondary | ICD-10-CM | POA: Insufficient documentation

## 2023-09-29 DIAGNOSIS — Z5111 Encounter for antineoplastic chemotherapy: Secondary | ICD-10-CM | POA: Insufficient documentation

## 2023-09-29 DIAGNOSIS — Z9079 Acquired absence of other genital organ(s): Secondary | ICD-10-CM | POA: Insufficient documentation

## 2023-09-29 DIAGNOSIS — C61 Malignant neoplasm of prostate: Secondary | ICD-10-CM | POA: Insufficient documentation

## 2023-09-29 DIAGNOSIS — Z7984 Long term (current) use of oral hypoglycemic drugs: Secondary | ICD-10-CM | POA: Insufficient documentation

## 2023-09-29 DIAGNOSIS — F17210 Nicotine dependence, cigarettes, uncomplicated: Secondary | ICD-10-CM | POA: Insufficient documentation

## 2023-09-29 DIAGNOSIS — Z7985 Long-term (current) use of injectable non-insulin antidiabetic drugs: Secondary | ICD-10-CM | POA: Insufficient documentation

## 2023-09-29 DIAGNOSIS — E119 Type 2 diabetes mellitus without complications: Secondary | ICD-10-CM | POA: Insufficient documentation

## 2023-09-29 DIAGNOSIS — Z79899 Other long term (current) drug therapy: Secondary | ICD-10-CM | POA: Insufficient documentation

## 2023-10-01 NOTE — Progress Notes (Unsigned)
[facility] CONSULT NOTE  Patient Care Team: Ronnald Ramp, MD as PCP - General (Family Medicine) Benita Gutter, RN as Oncology Nurse Navigator Earna Coder, MD as Consulting Physician (Oncology)  CHIEF COMPLAINTS/PURPOSE OF CONSULTATION: Colon cancer  Oncology History Overview Note     Colon cancer, status post a partial left colectomy 09/04/2023, stage IIIa (pT2 pN1b) CT abdomen/pelvis 03/01/2023-prostate/pelvic abscess, large amount of colonic stool CT chest 03/30/2023-bilateral solid pulmonary nodules-likely benign, 39-month follow-up CT recommended Colonoscopy 04/12/2023-multiple polyps removed, greater than 50 mm polyp in the distal ascending colon and sigmoid colon not removed Colonoscopy 06/29/2023 greater than 50 mm polyps removed from the ascending, transverse, and descending colon-tubular adenomas, sigmoid lesion at 35 cm incompletely resected with biopsy revealing an ulcerated tubular adenoma with at least high-grade dysplasia and suspicious for invasive or [place] Descending colon resection 09/04/2023, pT2, PN 1B moderately differentiated adenocarcinoma, negative resection margins, lymphovascular invasion present, 3/11 lymph nodes, 2 tumor deposits, mismatch repair protein expression intact, MSS  Multiple colon polyps on colonoscopy 04/12/2023 and 06/29/2023-tubular adenomas Prostate cancer-Gleason 7 acinar adenocarcinoma involving 5-10% of submitted tissue from a TUR specimen 03/03/2023 Prostate 03/20/2023-mild prostamegaly- PI-RADS category 5 lesion in the left peripheral zone and category 4 lesion in the left anterior peripheral zone and left anterior fibromuscular stroma, reduced size of prostate abscess with continued surrounding prostatitis and periprostatic stranding IMRT 20 6/24 - 07/20/2023  4.  Diabetes 5.  History of colon cancer-negative CancerNext expanded panel 08/29/2023 6.  History of lung nodules, ongoing tobacco use, followed in the lung  cancer screening clinic# prostate/pelvic abscess.  He was admitted for antibiotics and drainage of the pelvic abscess 03/01/2023. A TUR and was found to have a Gleason 7 prostate cancer involving 5-10% of the submitted tissue.  He was referred to Dr. Rushie Chestnut and completed external beam radiation to the prostate.  # MAY 2024 [screening colo]- A greater than 50 mm polyp was removed from the proximal ascending colon.  Additional greater than 50 m polyps were not removed from the distal ascending and sigmoid colon.  Additional smaller polyps were removed from the colon. The pathology revealed multiple fragments of tubular adenomas. Dr. Baird Lyons than 50 mm polyps were removed from the ascending, transverse, and descending colon.  A polypoid lesion at 35 cm was incompletely resected and tattooed.  The pathology from multiple polyps returned as tubular adenomas without high-grade dysplasia or carcinoma.  # OCTOBER 2024- STAGE III SIGMOID COLON CANCER [Dr.Sherril/Dr.White- [place]] pathology from the sigmoid resection returned as invasive moderately differentiated adenocarcinoma involving the descending colon.  Carcinoma invades into but not through the muscularis propria.  The resection margins are negative.  Lymphovascular invasion is present.  Metastatic carcinoma was identified in 3 of 11 lymph nodes and there were 2 tumor deposits.  Mismatch repair protein expression is intact.  # NOV 4th, 2024- FOLFOX q 2 w x 6 months.   # APRIL 2024-  Prostate cancer [s/p EBRT- Dr.Chrystal]-    Cancer of descending colon (HCC)  09/19/2023 Initial Diagnosis   Cancer of descending colon (HCC)   09/19/2023 Cancer Staging   Staging form: Colon and Rectum, AJCC 8th Edition - Pathologic: pT2, cM0 - Signed by Ladene Artist, MD on 09/19/2023 Histologic grading system: 4 grade system Histologic grade (G): G2 Residual tumor (R): R0 - None Laterality: Left Lymph-vascular invasion (LVI): LVI present/identified,  NOS Tumor deposits (TD): Present Perineural invasion (PNI): Absent Microsatellite instability (MSI): Stable   09/19/2023 Cancer Staging  Staging form: Colon and Rectum, AJCC 8th Edition - Pathologic: Stage IIIA (pT2, pN1b, cM0) - Signed by Ladene Artist, MD on 09/19/2023   10/13/2023 -  Chemotherapy   Patient is on Treatment Plan : COLORECTAL FOLFOX q14d x 6 months         HISTORY OF PRESENTING ILLNESS: Patient ambulating-independently.  Alone.  Colin Griffin 72 y.o.  male pleasant patient with a new diagnosis of colon cancer is here to discuss adjuvant chemotherapy options.  Patient noted to have a diagnosis of prostate cancer approximately 6 months ago.   Patient underwent surveillance colonoscopy that showed multiple polyps; Resection Showed Adenocarcinoma.  This led to left hemicolectomy.  Patient recovering well from surgery.  Denies any blood in stools or black-colored stools.  No nausea no vomiting.   Review of Systems  Constitutional:  Negative for chills, diaphoresis, fever, malaise/fatigue and weight loss.  HENT:  Negative for nosebleeds and sore throat.   Eyes:  Negative for double vision.  Respiratory:  Negative for cough, hemoptysis, sputum production, shortness of breath and wheezing.   Cardiovascular:  Negative for chest pain, palpitations, orthopnea and leg swelling.  Gastrointestinal:  Negative for abdominal pain, blood in stool, constipation, diarrhea, heartburn, melena, nausea and vomiting.  Genitourinary:  Negative for dysuria, frequency and urgency.  Musculoskeletal:  Negative for back pain and joint pain.  Skin: Negative.  Negative for itching and rash.  Neurological:  Negative for dizziness, tingling, focal weakness, weakness and headaches.  Endo/Heme/Allergies:  Does not bruise/bleed easily.  Psychiatric/Behavioral:  Negative for depression. The patient is not nervous/anxious and does not have insomnia.     MEDICAL HISTORY:  Past Medical  History:  Diagnosis Date   Cancer [facility]) April 2024   Constipation 03/04/2023   Diabetes mellitus without complication [facility]) March 2024    SURGICAL HISTORY: Past Surgical History:  Procedure Laterality Date   APPENDECTOMY     BIOPSY  06/29/2023   Procedure: BIOPSY;  Surgeon: Lemar Lofty., MD;  Location: [facility];  Service: Gastroenterology;;   COLONOSCOPY WITH PROPOFOL N/A 04/12/2023   Procedure: COLONOSCOPY WITH PROPOFOL;  Surgeon: [name], MD;  Location: [facility];  Service: Gastroenterology;  Laterality: N/A;   COLONOSCOPY WITH PROPOFOL N/A 06/29/2023   Procedure: COLONOSCOPY WITH PROPOFOL;  Surgeon: [name] Netty Starring., MD;  Location: [facility];  Service: Gastroenterology;  Laterality: N/A;   ENDOSCOPIC MUCOSAL RESECTION N/A 06/29/2023   Procedure: ENDOSCOPIC MUCOSAL RESECTION;  Surgeon: [name] Netty Starring., MD;  Location: [facility];  Service: Gastroenterology;  Laterality: N/A;   FLEXIBLE SIGMOIDOSCOPY N/A 09/04/2023   Procedure: FLEXIBLE SIGMOIDOSCOPY;  Surgeon: Andria Meuse, MD;  Location: [facility];  Service: General;  Laterality: N/A;   HEMOSTASIS CONTROL  06/29/2023   Procedure: HEMOSTASIS CONTROL;  Surgeon: Lemar Lofty., MD;  Location: [facility];  Service: Gastroenterology;;   IR IMAGING GUIDED PORT INSERTION  09/26/2023   IR RADIOLOGIST EVAL & MGMT  03/16/2023   PELVIC ABCESS DRAINAGE     March 2024   POLYPECTOMY  06/29/2023   Procedure: POLYPECTOMY;  Surgeon: [name] Netty Starring., MD;  Location: [facility];  Service: Gastroenterology;;   Sunnie Nielsen LIFTING INJECTION  06/29/2023   Procedure: SUBMUCOSAL LIFTING INJECTION;  Surgeon: Lemar Lofty., MD;  Location: [facility];  Service: Gastroenterology;;   SUBMUCOSAL TATTOO INJECTION  06/29/2023   Procedure: SUBMUCOSAL TATTOO INJECTION;  Surgeon: Lemar Lofty., MD;  Location: [facility];  Service: Gastroenterology;;   TRANSURETHRAL RESECTION OF  PROSTATE N/A 03/03/2023  Procedure: TRANSURETHRAL RESECTION OF THE PROSTATE (TURP)/ UNROOFING OF PROSTATE ABSCESS;  Surgeon: Riki Altes, MD;  Location: [facility];  Service: Urology;  Laterality: N/A;    SOCIAL HISTORY: Social History   Socioeconomic History   Marital status: Married    Spouse name: Editor, commissioning   Number of children: 1   Years of education: Not on file   Highest education level: Master's degree (e.g., MA, MS, MEng, MEd, MSW, MBA)  Occupational History   Not on file  Tobacco Use   Smoking status: Every Day    Current packs/day: 1.00    Average packs/day: 1 pack/day for 54.0 years (54.0 ttl pk-yrs)    Types: Cigarettes   Smokeless tobacco: Never   Tobacco comments:    Reports that at one time he smoked 3 packs per day while in the [employer]  Vaping Use   Vaping status: Every Day   Start date: 08/08/2022   Substances: Flavoring  Substance and Sexual Activity   Alcohol use: Not Currently   Drug use: Never   Sexual activity: Not Currently    Birth control/protection: None  Other Topics Concern   Not on file  Social History Narrative   ** Merged History Encounter **       Retired from Holiday representative for 30 years    Social Determinants of Corporate investment banker Strain: Low Risk  (05/01/2023)   Overall Financial Resource Strain (CARDIA)    Difficulty of Paying Living Expenses: Not hard at all  Food Insecurity: No Food Insecurity (09/19/2023)   Hunger Vital Sign    Worried About Running Out of Food in the Last Year: Never true    Ran Out of Food in the Last Year: Never true  Transportation Needs: No Transportation Needs (09/19/2023)   PRAPARE - Administrator, Civil Service (Medical): No    Lack of Transportation (Non-Medical): No  Physical Activity: Sufficiently Active (05/01/2023)   Exercise Vital Sign    Days of Exercise per Week: 6 days    Minutes of Exercise per Session: 150+ min  Stress: No Stress Concern Present (05/01/2023)    [employer] of Occupational Health - Occupational Stress Questionnaire    Feeling of Stress : Not at all  Social Connections: Moderately Integrated (05/01/2023)   Social Connection and Isolation Panel [NHANES]    Frequency of Communication with Friends and Family: Three times a week    Frequency of Social Gatherings with Friends and Family: Twice a week    Attends Religious Services: More than 4 times per year    Active Member of [employer] or Organizations: No    Attends Banker Meetings: Patient declined    Marital Status: Married  Catering manager Violence: Not At Risk (09/19/2023)   Humiliation, Afraid, Rape, and Kick questionnaire    Fear of Current or Ex-Partner: No    Emotionally Abused: No    Physically Abused: No    Sexually Abused: No    FAMILY HISTORY: Family History  Problem Relation Age of Onset   Atrial fibrillation Mother    Skin cancer Father        non-melanoma   Colon cancer Sister 82       Lynch Syndrome   Breast cancer Sister 71   Diabetes Brother    Throat cancer Maternal Uncle    Breast cancer Paternal Aunt 41 - 75   Colon cancer Maternal Grandmother 38   Alcohol abuse Maternal Grandfather  Breast cancer Paternal Grandmother 102   Prostate cancer Paternal Grandfather 24 - 79   Other Niece        2 nieces have Lynch Syndrome (brother's daughters)   Diabetes Other    Ovarian cancer Other 45       paternal first cousin once removed   Colon polyps Neg Hx    Stomach cancer Neg Hx    Esophageal cancer Neg Hx    Inflammatory bowel disease Neg Hx    Liver disease Neg Hx    Pancreatic cancer Neg Hx     ALLERGIES:  has No Known Allergies.  MEDICATIONS:  Current Outpatient Medications  Medication Sig Dispense Refill   ascorbic acid (VITAMIN C) 500 MG tablet Take 0.5 tablets (250 mg total) by mouth 2 (two) times daily. 30 tablet 0   Continuous Blood Gluc Sensor (FREESTYLE LIBRE 3 SENSOR) MISC 2 Devices by Does not apply route daily.  Place 1 sensor on the skin every 14 days. Use to check glucose continuously 2 each 6   ferrous sulfate 325 (65 FE) MG tablet Take 1 tablet (325 mg total) by mouth 2 (two) times daily with a meal. 60 tablet 0   ondansetron (ZOFRAN) 8 MG tablet One pill every 8 hours as needed for nausea/vomitting. 40 tablet 1   prochlorperazine (COMPAZINE) 10 MG tablet Take 1 tablet (10 mg total) by mouth every 6 (six) hours as needed for nausea or vomiting. 40 tablet 1   Dulaglutide (TRULICITY) 0.75 MG/0.5ML SOPN Inject 0.75 mg into the skin once a week. (Patient not taking: Reported on 10/02/2023) 2 mL 3   lidocaine-prilocaine (EMLA) cream Apply 1 Application topically as needed. 30 g 1   metFORMIN (GLUCOPHAGE-XR) 500 MG 24 hr tablet Take 1 tablet (500 mg total) by mouth 2 (two) times daily with a meal. (Patient not taking: Reported on 10/02/2023) 180 tablet 1   No current facility-administered medications for this visit.    PHYSICAL EXAMINATION:   Vitals:   10/02/23 1041  BP: (!) 147/70  Pulse: 68  Temp: (!) 96.2 F (35.7 C)  SpO2: 100%   Filed Weights   10/02/23 1041  Weight: 169 lb 6.4 oz (76.8 kg)    Physical Exam Vitals and nursing note reviewed.  HENT:     Head: Normocephalic and atraumatic.     Mouth/Throat:     Pharynx: Oropharynx is clear.  Eyes:     Extraocular Movements: Extraocular movements intact.     Pupils: Pupils are equal, round, and reactive to light.  Cardiovascular:     Rate and Rhythm: Normal rate and regular rhythm.  Pulmonary:     Comments: Decreased breath sounds bilaterally.  Abdominal:     Palpations: Abdomen is soft.  Musculoskeletal:        General: Normal range of motion.     Cervical back: Normal range of motion.  Skin:    General: Skin is warm.  Neurological:     General: No focal deficit present.     Mental Status: He is alert and oriented to person, place, and time.  Psychiatric:        Behavior: Behavior normal.        Judgment: Judgment normal.      LABORATORY DATA:  I have reviewed the data as listed Lab Results  Component Value Date   WBC 9.3 09/06/2023   HGB 11.1 (L) 09/06/2023   HCT 34.0 (L) 09/06/2023   MCV 99.4 09/06/2023   PLT 143 (L)  09/06/2023   Recent Labs    03/01/23 1954 03/02/23 0504 03/05/23 0443 03/06/23 0342 03/16/23 1540 07/06/23 0823 08/29/23 1035 09/04/23 1257 09/05/23 0437  NA  --    < > 138 140   < > 135 137  --  135  K  --    < > 3.9 4.4   < > 4.4 4.6  --  4.0  CL  --    < > 106 105   < > 106 113*  --  105  CO2  --    < > 25 29   < > 22 21*  --  21*  GLUCOSE  --    < > 161* 132*   < > 181* 109*  --  106*  BUN  --    < > 12 12   < > 25* 21  --  14  CREATININE  --    < > 0.89 0.80   < > 1.04 1.09 1.21 0.98  CALCIUM  --    < > 8.8* 9.0   < > 9.1 9.2  --  8.5*  GFRNONAA  --    < > >60 >60  --  >60 >60 >60 >60  PROT 8.4*   < > 6.4* 6.9  --  6.9  --   --   --   ALBUMIN 3.2*   < > 2.6* 2.7*  --  3.9  --   --   --   AST 40   < > 53* 40  --  17  --   --   --   ALT 48*   < > 76* 65*  --  21  --   --   --   ALKPHOS 186*   < > 155* 148*  --  56  --   --   --   BILITOT 0.8   < > 0.7 0.5  --  0.2*  --   --   --   BILIDIR 0.2  --   --   --   --   --   --   --   --   IBILI 0.6  --   --   --   --   --   --   --   --    < > = values in this interval not displayed.    RADIOGRAPHIC STUDIES: I have personally reviewed the radiological images as listed and agreed with the findings in the report. IR IMAGING GUIDED PORT INSERTION  Result Date: 09/26/2023 CLINICAL DATA:  Colorectal carcinoma and need for porta cath for chemotherapy. EXAM: IMPLANTED PORT A CATH PLACEMENT WITH ULTRASOUND AND FLUOROSCOPIC GUIDANCE ANESTHESIA/SEDATION: Moderate (conscious) sedation was employed during this procedure. A total of Versed 2.0 mg and Fentanyl 100 mcg was administered intravenously by radiology nursing. Moderate Sedation Time: 31 minutes. The patient's level of consciousness and vital signs were monitored continuously by  radiology nursing throughout the procedure under my direct supervision. FLUOROSCOPY: 42 seconds.  4.0 mGy. PROCEDURE: The procedure, risks, benefits, and alternatives were explained to the patient. Questions regarding the procedure were encouraged and answered. The patient understands and consents to the procedure. A time-out was performed prior to initiating the procedure. Ultrasound was utilized to confirm patency of the right internal jugular vein. An ultrasound image was saved and recorded. The right neck and chest were prepped with chlorhexidine in a sterile fashion, and a sterile drape was applied covering the operative field. Maximum barrier  sterile technique with sterile gowns and gloves were used for the procedure. Local anesthesia was provided with 1% lidocaine. After creating a small venotomy incision, a 21 gauge needle was advanced into the right internal jugular vein under direct, real-time ultrasound guidance. Ultrasound image documentation was performed. After securing guidewire access, an 8 Fr dilator was placed. A J-wire was kinked to measure appropriate catheter length. A subcutaneous port pocket was then created along the upper chest wall utilizing sharp and blunt dissection. Portable cautery was utilized. The pocket was irrigated with sterile saline. A single lumen power injectable port was chosen for placement. The 8 Fr catheter was tunneled from the port pocket site to the venotomy incision. The port was placed in the pocket. External catheter was trimmed to appropriate length based on guidewire measurement. At the venotomy, an 8 Fr peel-away sheath was placed over a guidewire. The catheter was then placed through the sheath and the sheath removed. Final catheter positioning was confirmed and documented with a fluoroscopic spot image. The port was accessed with a needle and aspirated and flushed with heparinized saline. The access needle was removed. The venotomy and port pocket incisions were  closed with subcutaneous 3-0 Monocryl and subcuticular 4-0 Vicryl. Dermabond was applied to both incisions. COMPLICATIONS: COMPLICATIONS None FINDINGS: After catheter placement, the tip lies at the cavo-atrial junction. The catheter aspirates normally and is ready for immediate use. IMPRESSION: Placement of single lumen port a cath via right internal jugular vein. The catheter tip lies at the cavo-atrial junction. A power injectable port a cath was placed and is ready for immediate use. Electronically Signed   By: Irish Lack M.D.   On: 09/26/2023 14:21     Cancer of descending colon (HCC) # OCT 2024-Colon cancer, status post a partial left colectomy 09/04/2023, stage IIIa (pT2 pN1b)-negative resection margins, lymphovascular invasion present, 3/11 lymph nodes, 2 tumor deposits. [Drs.Sherrill; White;Masarouty]. mismatch repair protein expression intact, MSS CT abdomen/pelvis 03/01/2023-prostate/pelvic abscess, large amount of colonic stool.  # Given limited sampling of the lymph nodes; possible LVI-recommend 6 months of FOLFOX chemotherapy.  # I discussed that FOLFOX chemotherapy is given every 2 weeks; discuss the potential side effects including but not limited to nausea vomiting diarrhea, sores in the mouth, hand-foot syndrome; also tingling and numbness/cold sensitivity with oxaliplatin.  # lung nodules/ [smoker]- CT chest 03/30/2023-bilateral solid pulmonary nodules-likely benign, follow-up of lung nodules in the lung cancer screening clinic. 63-month follow-up CT recommended.  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  # IDA- Hb 11- ferritin- on  PO iron; hold off venofer for now.   # Acitive smoker: Recommend quitting smoking.  Continue lung cancer screening.  Thank you Dr.Sherrill for allowing me to participate in the care of your pleasant patient. Please do not hesitate to contact me with questions or concerns in the interim.   # DISPOSITION:  # follow up in 1 week- APP; labs- cbc/cmp;PSA- FOLFOX;  d-3 pump off-   # follow up in 3 weeks- MD; labs- cbc/cmp; FOLFOX;  d-3 pump off; Dr.B   Above plan of care was discussed with patient/family in detail.  My contact information was given to the patient/family.     Earna Coder, MD 10/02/2023 12:08 PM

## 2023-10-01 NOTE — Assessment & Plan Note (Addendum)
Colon cancer, status post a partial left colectomy 09/04/2023, stage IIIa (pT2 pN1b)-negative resection margins, lymphovascular invasion present, 3/11 lymph nodes, 2 tumor deposits, mismatch repair protein expression intact, MSS CT abdomen/pelvis 03/01/2023-prostate/pelvic abscess, large amount of colonic stool; CT chest 03/30/2023-bilateral solid pulmonary nodules-likely benign, 64-month follow-up CT recommended  Mr. Colin Griffin has been diagnosed with stage III colon cancer.  I reviewed details of the surgical pathology report, the prognosis, and adjuvant systemic treatment options with him today.  There is a significant chance of developing recurrent disease over the next few years based on the multiple positive lymph nodes, limited number of lymph nodes sampled, lymphovascular invasion, and tumor deposits.  I recommend adjuvant 5-fluorouracil based chemotherapy.  We discussed the expected benefit of adjuvant 5-fluorouracil and the addition of oxaliplatin.  He is technically a candidate for 3 months of adjuvant chemotherapy, but based on the high risk features above I recommend 6 months of adjuvant therapy.  We discussed CAPOX and FOLFOX.  I recommend FOLFOX.  He is in agreement.  We reviewed potential toxicities associated with the FOLFOX regimen including the chance of nausea/vomiting, mucositis, diarrhea, alopecia, hematologic toxicity, infection, and bleeding.  We discussed the sun sensitivity, rash, hyperpigmentation, hand/foot syndrome, and cardiac toxicity associated with 5-fluorouracil.  We discussed the allergic reaction and various types of neuropathy seen with oxaliplatin.  He understands he he may have an increased risk of neuropathy with the recent diagnosis of diabetes.  He agrees to proceed.  Mr. Colin Griffin understands the need for Port-A-Cath placement in order to administer FOLFOX.  We will refer him to Dr. Cliffton Asters for Port-A-Cath placement.  He does not appear to have hereditary nonpolyposis colon cancer  syndrome based on the mismatch repair protein and microsatellite instability testing.  He also had negative genetic testing.  His family members are at increased risk of developing colorectal cancer and should receive appropriate screening.  Mr. Colin Griffin should continue surveillance imaging for colon cancer and follow-up of lung nodules in the lung cancer screening clinic.  He will continue colonoscopy surveillance with gastroenterology and prostate cancer follow-up with radiation oncology.  He indicated his preference to receive adjuvant chemotherapy closer to home.  I will refer him to Dr. Donneta Romberg at the [facility].     # Multiple colon polyps on colonoscopy 04/12/2023 and 06/29/2023-tubular adenomas- recommend  Genetics counseling: I at length discussed with the patient that majority of cancers are sporadic however 10 to 20% at risk of genetic/hereditary cancer syndromes.  Discussed importance of genetic counseling/genetic testing-given the therapeutic/preventative aspects.  Patient interested in genetic counseling.  Referral to genetic counselling re: cancer. negative CancerNext expanded panel 08/29/2023  # Prostate cancer-Gleason 7 acinar adenocarcinoma involving 5-10% of submitted tissue from a TUR specimen 03/03/2023; s/p EBRT- IMRT 20 6/24 - 07/20/2023  # Diabetes  # History of lung nodules, ongoing tobacco use, followed in the lung cancer screening clinic; #  CT chest 03/30/2023-bilateral solid pulmonary nodules-likely benign, 92-month follow-up CT recommended-   # DISPOSITION:

## 2023-10-02 ENCOUNTER — Encounter: Payer: Self-pay | Admitting: Internal Medicine

## 2023-10-02 ENCOUNTER — Inpatient Hospital Stay: Payer: Medicare PPO | Admitting: Internal Medicine

## 2023-10-02 ENCOUNTER — Inpatient Hospital Stay: Payer: Medicare PPO

## 2023-10-02 VITALS — BP 147/70 | HR 68 | Temp 96.2°F | Ht 73.0 in | Wt 169.4 lb

## 2023-10-02 DIAGNOSIS — Z79899 Other long term (current) drug therapy: Secondary | ICD-10-CM | POA: Diagnosis not present

## 2023-10-02 DIAGNOSIS — C186 Malignant neoplasm of descending colon: Secondary | ICD-10-CM

## 2023-10-02 DIAGNOSIS — Z5111 Encounter for antineoplastic chemotherapy: Secondary | ICD-10-CM | POA: Diagnosis present

## 2023-10-02 DIAGNOSIS — E119 Type 2 diabetes mellitus without complications: Secondary | ICD-10-CM | POA: Diagnosis not present

## 2023-10-02 DIAGNOSIS — Z7984 Long term (current) use of oral hypoglycemic drugs: Secondary | ICD-10-CM | POA: Diagnosis not present

## 2023-10-02 DIAGNOSIS — R918 Other nonspecific abnormal finding of lung field: Secondary | ICD-10-CM | POA: Diagnosis not present

## 2023-10-02 DIAGNOSIS — C61 Malignant neoplasm of prostate: Secondary | ICD-10-CM | POA: Diagnosis present

## 2023-10-02 DIAGNOSIS — Z9079 Acquired absence of other genital organ(s): Secondary | ICD-10-CM | POA: Diagnosis not present

## 2023-10-02 DIAGNOSIS — F17210 Nicotine dependence, cigarettes, uncomplicated: Secondary | ICD-10-CM | POA: Diagnosis not present

## 2023-10-02 DIAGNOSIS — Z7985 Long-term (current) use of injectable non-insulin antidiabetic drugs: Secondary | ICD-10-CM | POA: Diagnosis not present

## 2023-10-02 MED ORDER — ONDANSETRON HCL 8 MG PO TABS: ORAL_TABLET | ORAL | 1 refills | Status: DC

## 2023-10-02 MED ORDER — LIDOCAINE-PRILOCAINE 2.5-2.5 % EX CREA: 1.0000 | TOPICAL_CREAM | CUTANEOUS | 1 refills | Status: AC | PRN

## 2023-10-02 MED ORDER — PROCHLORPERAZINE MALEATE 10 MG PO TABS: 10.0000 mg | ORAL_TABLET | Freq: Four times a day (QID) | ORAL | 1 refills | Status: DC | PRN

## 2023-10-02 NOTE — Progress Notes (Signed)
Needs rx for emla.  Partial colon taken out, about 4-5 weeks ago.

## 2023-10-02 NOTE — Progress Notes (Signed)
Pharmacist Chemotherapy Monitoring - Initial Assessment    Anticipated start date: 10/11/23   The following has been reviewed per standard work regarding the patient's treatment regimen: The patient's diagnosis, treatment plan and drug doses, and organ/hematologic function Lab orders and baseline tests specific to treatment regimen  The treatment plan start date, drug sequencing, and pre-medications Prior authorization status  Patient's documented medication list, including drug-drug interaction screen and prescriptions for anti-emetics and supportive care specific to the treatment regimen The drug concentrations, fluid compatibility, administration routes, and timing of the medications to be used The patient's access for treatment and lifetime cumulative dose history, if applicable  The patient's medication allergies and previous infusion related reactions, if applicable   Changes made to treatment plan:  N/A  Follow up needed:  N/A   Ebony Hail, Pharm.D., CPP 10/02/2023@4 :07 PM

## 2023-10-02 NOTE — Progress Notes (Signed)
Provided nurse navigator contact information.

## 2023-10-02 NOTE — Progress Notes (Signed)
START ON PATHWAY REGIMEN - Colorectal     A cycle is every 14 days:     Oxaliplatin      Leucovorin      Fluorouracil      Fluorouracil   **Always confirm dose/schedule in your pharmacy ordering system**  Patient Characteristics: Postoperative without Neoadjuvant Therapy, M0 (Pathologic Staging), Colon, Stage III, Low Risk (pT1-3, pN1) Tumor Location: Colon Therapeutic Status: Postoperative without Neoadjuvant Therapy, M0 (Pathologic Staging) AJCC M Category: cM0 AJCC T Category: pT2 AJCC N Category: pN1b AJCC 8 Stage Grouping: IIIA Intent of Therapy: Curative Intent, Discussed with Patient

## 2023-10-03 ENCOUNTER — Other Ambulatory Visit: Payer: Self-pay

## 2023-10-06 ENCOUNTER — Encounter: Payer: Self-pay | Admitting: Internal Medicine

## 2023-10-09 ENCOUNTER — Ambulatory Visit
Admission: RE | Admit: 2023-10-09 | Discharge: 2023-10-09 | Disposition: A | Discharge status: Home / Self Care | Payer: Medicare PPO | Source: Ambulatory Visit | Attending: Acute Care | Admitting: Acute Care

## 2023-10-09 DIAGNOSIS — Z122 Encounter for screening for malignant neoplasm of respiratory organs: Secondary | ICD-10-CM

## 2023-10-09 DIAGNOSIS — Z87891 Personal history of nicotine dependence: Secondary | ICD-10-CM

## 2023-10-09 DIAGNOSIS — F17210 Nicotine dependence, cigarettes, uncomplicated: Secondary | ICD-10-CM

## 2023-10-11 ENCOUNTER — Inpatient Hospital Stay: Payer: Medicare PPO

## 2023-10-11 ENCOUNTER — Inpatient Hospital Stay: Payer: Medicare PPO | Admitting: Internal Medicine

## 2023-10-11 ENCOUNTER — Other Ambulatory Visit: Payer: Self-pay

## 2023-10-11 ENCOUNTER — Encounter: Payer: Self-pay | Admitting: Internal Medicine

## 2023-10-11 VITALS — BP 151/75 | HR 80 | Temp 95.2°F | Ht 73.0 in | Wt 165.0 lb

## 2023-10-11 DIAGNOSIS — C186 Malignant neoplasm of descending colon: Secondary | ICD-10-CM | POA: Diagnosis not present

## 2023-10-11 DIAGNOSIS — C61 Malignant neoplasm of prostate: Secondary | ICD-10-CM

## 2023-10-11 DIAGNOSIS — Z5111 Encounter for antineoplastic chemotherapy: Secondary | ICD-10-CM | POA: Diagnosis not present

## 2023-10-11 LAB — CBC (CANCER CENTER ONLY)
HCT: 34.7 % — ABNORMAL LOW (ref 39.0–52.0)
Hemoglobin: 12.2 g/dL — ABNORMAL LOW (ref 13.0–17.0)
MCH: 32.9 pg (ref 26.0–34.0)
MCHC: 35.2 g/dL (ref 30.0–36.0)
MCV: 93.5 fL (ref 80.0–100.0)
Platelet Count: 197 10*3/uL (ref 150–400)
RBC: 3.71 MIL/uL — ABNORMAL LOW (ref 4.22–5.81)
RDW: 11.9 % (ref 11.5–15.5)
WBC Count: 6.6 10*3/uL (ref 4.0–10.5)
nRBC: 0 % (ref 0.0–0.2)

## 2023-10-11 LAB — CMP (CANCER CENTER ONLY)
ALT: 15 U/L (ref 0–44)
AST: 17 U/L (ref 15–41)
Albumin: 4 g/dL (ref 3.5–5.0)
Alkaline Phosphatase: 53 U/L (ref 38–126)
Anion gap: 7 (ref 5–15)
BUN: 18 mg/dL (ref 8–23)
CO2: 22 mmol/L (ref 22–32)
Calcium: 9.2 mg/dL (ref 8.9–10.3)
Chloride: 108 mmol/L (ref 98–111)
Creatinine: 0.91 mg/dL (ref 0.61–1.24)
GFR, Estimated: >60 mL/min (ref >60)
Glucose, Bld: 160 mg/dL — ABNORMAL HIGH (ref 70–99)
Potassium: 4.1 mmol/L (ref 3.5–5.1)
Sodium: 137 mmol/L (ref 135–145)
Total Bilirubin: 0.2 mg/dL (ref <1.2)
Total Protein: 6.8 g/dL (ref 6.5–8.1)

## 2023-10-11 LAB — PSA: Prostatic Specific Antigen: 0.11 ng/mL (ref 0.00–4.00)

## 2023-10-11 LAB — IRON AND TIBC
Iron: 102 ug/dL (ref 45–182)
Saturation Ratios: 30 % (ref 17.9–39.5)
TIBC: 340 ug/dL (ref 250–450)
UIBC: 238 ug/dL

## 2023-10-11 LAB — FERRITIN: Ferritin: 98 ng/mL (ref 24–336)

## 2023-10-11 MED ORDER — FLUOROURACIL CHEMO INJECTION 2.5 GM/50ML
400.0000 mg/m2 | Freq: Once | INTRAVENOUS | Status: AC
  Administered 2023-10-11: 800 mg via INTRAVENOUS
  Filled 2023-10-11: qty 16

## 2023-10-11 MED ORDER — DEXAMETHASONE SODIUM PHOSPHATE 10 MG/ML IJ SOLN
10.0000 mg | Freq: Once | INTRAMUSCULAR | Status: AC
  Administered 2023-10-11: 10 mg via INTRAVENOUS
  Filled 2023-10-11: qty 1

## 2023-10-11 MED ORDER — LEUCOVORIN CALCIUM INJECTION 350 MG
400.0000 mg/m2 | Freq: Once | INTRAVENOUS | Status: AC
  Administered 2023-10-11: 796 mg via INTRAVENOUS
  Filled 2023-10-11: qty 39.8

## 2023-10-11 MED ORDER — OXALIPLATIN CHEMO INJECTION 100 MG/20ML
85.0000 mg/m2 | Freq: Once | INTRAVENOUS | Status: AC
  Administered 2023-10-11: 170 mg via INTRAVENOUS
  Filled 2023-10-11: qty 34

## 2023-10-11 MED ORDER — FLUOROURACIL CHEMO INJECTION 5 GM/100ML
2400.0000 mg/m2 | INTRAVENOUS | Status: DC
  Administered 2023-10-11: 5000 mg via INTRAVENOUS
  Filled 2023-10-11: qty 100

## 2023-10-11 MED ORDER — DEXTROSE 5 % IV SOLN
INTRAVENOUS | Status: DC
  Filled 2023-10-11: qty 250

## 2023-10-11 MED ORDER — PALONOSETRON HCL INJECTION 0.25 MG/5ML
0.2500 mg | Freq: Once | INTRAVENOUS | Status: AC
  Administered 2023-10-11: 0.25 mg via INTRAVENOUS
  Filled 2023-10-11: qty 5

## 2023-10-11 NOTE — Assessment & Plan Note (Addendum)
#   OCT 2024-Colon cancer, status post a partial left colectomy 09/04/2023, stage IIIa (pT2 pN1b)-negative resection margins, lymphovascular invasion present, 3/11 lymph nodes, 2 tumor deposits. [Drs.Sherrill; White;Masarouty]. mismatch repair protein expression intact. FOLFOX chemotherapy is given every 2 weeks  # Proceed with FOLFOX #1 of planned 12.  Labs-CBC/chemistries were reviewed with the patient.  # I discussed that FOLFOX chemotherapy is given every 2 weeks; discuss the potential side effects including but not limited to nausea vomiting diarrhea, sores in the mouth, hand-foot syndrome; also tingling and numbness/cold sensitivity with oxaliplatin.  # lung nodules/ [smoker]- CT chest 03/30/2023-bilateral solid pulmonary nodules-likely benign- NOV 11th CT-pending.   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  # IDA- Hb 11- ferritin- on PO iron; hold off venofer for now. Recheck iron studies; and possible venofer.   # Acitive smoker: Recommend quitting smoking.  Continue lung cancer screening-   # IV access port   # DISPOSITION:  # add iron studies; ferritin to labs today.  # FOLFOX-chemo today;  d-3 pump off;   # follow up in 2 weeks- MD; port/labs- cbc/cmp; FOLFOX;possible venofer-  d-3 pump off;    # # follow up in 4  weeks- MD; port/labs-- cbc/cmp; FOLFOX;possible venofer-  d-3 pump off;  Dr.B

## 2023-10-11 NOTE — Progress Notes (Signed)
CT chest 10/09/23, ordered by Kandice Robinsons NP.

## 2023-10-11 NOTE — Progress Notes (Signed)
[facility] CONSULT NOTE  Patient Care Team: Ronnald Ramp, MD as PCP - General (Family Medicine) Benita Gutter, RN as Oncology Nurse Navigator Earna Coder, MD as Consulting Physician (Oncology)  CHIEF COMPLAINTS/PURPOSE OF CONSULTATION: Colon cancer  Oncology History Overview Note     Colon cancer, status post a partial left colectomy 09/04/2023, stage IIIa (pT2 pN1b) CT abdomen/pelvis 03/01/2023-prostate/pelvic abscess, large amount of colonic stool CT chest 03/30/2023-bilateral solid pulmonary nodules-likely benign, 11-month follow-up CT recommended Colonoscopy 04/12/2023-multiple polyps removed, greater than 50 mm polyp in the distal ascending colon and sigmoid colon not removed Colonoscopy 06/29/2023 greater than 50 mm polyps removed from the ascending, transverse, and descending colon-tubular adenomas, sigmoid lesion at 35 cm incompletely resected with biopsy revealing an ulcerated tubular adenoma with at least high-grade dysplasia and suspicious for invasive or [place] Descending colon resection 09/04/2023, pT2, PN 1B moderately differentiated adenocarcinoma, negative resection margins, lymphovascular invasion present, 3/11 lymph nodes, 2 tumor deposits, mismatch repair protein expression intact, MSS  Multiple colon polyps on colonoscopy 04/12/2023 and 06/29/2023-tubular adenomas Prostate cancer-Gleason 7 acinar adenocarcinoma involving 5-10% of submitted tissue from a TUR specimen 03/03/2023 Prostate 03/20/2023-mild prostamegaly- PI-RADS category 5 lesion in the left peripheral zone and category 4 lesion in the left anterior peripheral zone and left anterior fibromuscular stroma, reduced size of prostate abscess with continued surrounding prostatitis and periprostatic stranding IMRT 20 6/24 - 07/20/2023  4.  Diabetes 5.  History of colon cancer-negative CancerNext expanded panel 08/29/2023 6.  History of lung nodules, ongoing tobacco use, followed in the lung  cancer screening clinic# prostate/pelvic abscess.  He was admitted for antibiotics and drainage of the pelvic abscess 03/01/2023. A TUR and was found to have a Gleason 7 prostate cancer involving 5-10% of the submitted tissue.  He was referred to Dr. Rushie Chestnut and completed external beam radiation to the prostate.  # MAY 2024 [screening colo]- A greater than 50 mm polyp was removed from the proximal ascending colon.  Additional greater than 50 m polyps were not removed from the distal ascending and sigmoid colon.  Additional smaller polyps were removed from the colon. The pathology revealed multiple fragments of tubular adenomas. Dr. Baird Lyons than 50 mm polyps were removed from the ascending, transverse, and descending colon.  A polypoid lesion at 35 cm was incompletely resected and tattooed.  The pathology from multiple polyps returned as tubular adenomas without high-grade dysplasia or carcinoma.  # OCTOBER 2024- STAGE III SIGMOID COLON CANCER [Dr.Sherril/Dr.White- [place]] pathology from the sigmoid resection returned as invasive moderately differentiated adenocarcinoma involving the descending colon.  Carcinoma invades into but not through the muscularis propria.  The resection margins are negative.  Lymphovascular invasion is present.  Metastatic carcinoma was identified in 3 of 11 lymph nodes and there were 2 tumor deposits.  Mismatch repair protein expression is intact.  # NOV 4th, 2024- FOLFOX q 2 w x 6 months.   # APRIL 2024-  Prostate cancer [s/p EBRT- Dr.Chrystal]-    Cancer of descending colon (HCC)  09/19/2023 Initial Diagnosis   Cancer of descending colon (HCC)   09/19/2023 Cancer Staging   Staging form: Colon and Rectum, AJCC 8th Edition - Pathologic: pT2, cM0 - Signed by Ladene Artist, MD on 09/19/2023 Histologic grading system: 4 grade system Histologic grade (G): G2 Residual tumor (R): R0 - None Laterality: Left Lymph-vascular invasion (LVI): LVI present/identified,  NOS Tumor deposits (TD): Present Perineural invasion (PNI): Absent Microsatellite instability (MSI): Stable   09/19/2023 Cancer Staging  Staging form: Colon and Rectum, AJCC 8th Edition - Pathologic: Stage IIIA (pT2, pN1b, cM0) - Signed by Ladene Artist, MD on 09/19/2023   10/13/2023 -  Chemotherapy   Patient is on Treatment Plan : COLORECTAL FOLFOX q14d x 6 months       HISTORY OF PRESENTING ILLNESS: Patient ambulating-independently.  Alone.  Colin Griffin 72 y.o.  male pleasant patient with a new diagnosis of  stage III colon cancer s/p left hemicolectomy is here to proceed with adjuvant chemotherapy.   Patient denies any symptoms.  Patient recovering well from surgery.  Denies any blood in stools or black-colored stools.  No nausea no vomiting.   Review of Systems  Constitutional:  Negative for chills, diaphoresis, fever, malaise/fatigue and weight loss.  HENT:  Negative for nosebleeds and sore throat.   Eyes:  Negative for double vision.  Respiratory:  Negative for cough, hemoptysis, sputum production, shortness of breath and wheezing.   Cardiovascular:  Negative for chest pain, palpitations, orthopnea and leg swelling.  Gastrointestinal:  Negative for abdominal pain, blood in stool, constipation, diarrhea, heartburn, melena, nausea and vomiting.  Genitourinary:  Negative for dysuria, frequency and urgency.  Musculoskeletal:  Negative for back pain and joint pain.  Skin: Negative.  Negative for itching and rash.  Neurological:  Negative for dizziness, tingling, focal weakness, weakness and headaches.  Endo/Heme/Allergies:  Does not bruise/bleed easily.  Psychiatric/Behavioral:  Negative for depression. The patient is not nervous/anxious and does not have insomnia.     MEDICAL HISTORY:  Past Medical History:  Diagnosis Date   Cancer [facility]) April 2024   Constipation 03/04/2023   Diabetes mellitus without complication [facility]) March 2024    SURGICAL HISTORY: Past  Surgical History:  Procedure Laterality Date   APPENDECTOMY     BIOPSY  06/29/2023   Procedure: BIOPSY;  Surgeon: Lemar Lofty., MD;  Location: [facility];  Service: Gastroenterology;;   COLONOSCOPY WITH PROPOFOL N/A 04/12/2023   Procedure: COLONOSCOPY WITH PROPOFOL;  Surgeon: [name], MD;  Location: [facility];  Service: Gastroenterology;  Laterality: N/A;   COLONOSCOPY WITH PROPOFOL N/A 06/29/2023   Procedure: COLONOSCOPY WITH PROPOFOL;  Surgeon: [name] Netty Starring., MD;  Location: [facility];  Service: Gastroenterology;  Laterality: N/A;   ENDOSCOPIC MUCOSAL RESECTION N/A 06/29/2023   Procedure: ENDOSCOPIC MUCOSAL RESECTION;  Surgeon: [name] Netty Starring., MD;  Location: [facility];  Service: Gastroenterology;  Laterality: N/A;   FLEXIBLE SIGMOIDOSCOPY N/A 09/04/2023   Procedure: FLEXIBLE SIGMOIDOSCOPY;  Surgeon: Andria Meuse, MD;  Location: [facility];  Service: General;  Laterality: N/A;   HEMOSTASIS CONTROL  06/29/2023   Procedure: HEMOSTASIS CONTROL;  Surgeon: Lemar Lofty., MD;  Location: [facility];  Service: Gastroenterology;;   IR IMAGING GUIDED PORT INSERTION  09/26/2023   IR RADIOLOGIST EVAL & MGMT  03/16/2023   PELVIC ABCESS DRAINAGE     March 2024   POLYPECTOMY  06/29/2023   Procedure: POLYPECTOMY;  Surgeon: Mansouraty, Netty Starring., MD;  Location: [facility];  Service: Gastroenterology;;   Sunnie Nielsen LIFTING INJECTION  06/29/2023   Procedure: SUBMUCOSAL LIFTING INJECTION;  Surgeon: Lemar Lofty., MD;  Location: [facility];  Service: Gastroenterology;;   SUBMUCOSAL TATTOO INJECTION  06/29/2023   Procedure: SUBMUCOSAL TATTOO INJECTION;  Surgeon: Lemar Lofty., MD;  Location: [facility];  Service: Gastroenterology;;   TRANSURETHRAL RESECTION OF PROSTATE N/A 03/03/2023   Procedure: TRANSURETHRAL RESECTION OF THE PROSTATE (TURP)/ UNROOFING OF PROSTATE ABSCESS;  Surgeon: Riki Altes, MD;  Location: [facility];  Service: Urology;  Laterality: N/A;    SOCIAL HISTORY: Social History   Socioeconomic History   Marital status: Married    Spouse name: Editor, commissioning   Number of children: 1   Years of education: Not on file   Highest education level: Master's degree (e.g., MA, MS, MEng, MEd, MSW, MBA)  Occupational History   Not on file  Tobacco Use   Smoking status: Every Day    Current packs/day: 1.00    Average packs/day: 1 pack/day for 54.0 years (54.0 ttl pk-yrs)    Types: Cigarettes   Smokeless tobacco: Never   Tobacco comments:    Reports that at one time he smoked 3 packs per day while in the [employer]  Vaping Use   Vaping status: Every Day   Start date: 08/08/2022   Substances: Flavoring  Substance and Sexual Activity   Alcohol use: Not Currently   Drug use: Never   Sexual activity: Not Currently    Birth control/protection: None  Other Topics Concern   Not on file  Social History Narrative   ** Merged History Encounter **       Retired from Holiday representative for 30 years    Social Determinants of Corporate investment banker Strain: Low Risk  (05/01/2023)   Overall Financial Resource Strain (CARDIA)    Difficulty of Paying Living Expenses: Not hard at all  Food Insecurity: No Food Insecurity (09/19/2023)   Hunger Vital Sign    Worried About Running Out of Food in the Last Year: Never true    Ran Out of Food in the Last Year: Never true  Transportation Needs: No Transportation Needs (09/19/2023)   PRAPARE - Administrator, Civil Service (Medical): No    Lack of Transportation (Non-Medical): No  Physical Activity: Sufficiently Active (05/01/2023)   Exercise Vital Sign    Days of Exercise per Week: 6 days    Minutes of Exercise per Session: 150+ min  Stress: No Stress Concern Present (05/01/2023)   [employer] of Occupational Health - Occupational Stress Questionnaire    Feeling of Stress : Not at all  Social Connections: Moderately Integrated (05/01/2023)    Social Connection and Isolation Panel [NHANES]    Frequency of Communication with Friends and Family: Three times a week    Frequency of Social Gatherings with Friends and Family: Twice a week    Attends Religious Services: More than 4 times per year    Active Member of [employer] or Organizations: No    Attends Banker Meetings: Patient declined    Marital Status: Married  Catering manager Violence: Not At Risk (09/19/2023)   Humiliation, Afraid, Rape, and Kick questionnaire    Fear of Current or Ex-Partner: No    Emotionally Abused: No    Physically Abused: No    Sexually Abused: No    FAMILY HISTORY: Family History  Problem Relation Age of Onset   Atrial fibrillation Mother    Skin cancer Father        non-melanoma   Colon cancer Sister 86       Lynch Syndrome   Breast cancer Sister 61   Diabetes Brother    Throat cancer Maternal Uncle    Breast cancer Paternal Aunt 29 - 3   Colon cancer Maternal Grandmother 71   Alcohol abuse Maternal Grandfather    Breast cancer Paternal Grandmother 102   Prostate cancer Paternal Grandfather 66 - 53   Other Niece  2 nieces have Lynch Syndrome (brother's daughters)   Diabetes Other    Ovarian cancer Other 58       paternal first cousin once removed   Colon polyps Neg Hx    Stomach cancer Neg Hx    Esophageal cancer Neg Hx    Inflammatory bowel disease Neg Hx    Liver disease Neg Hx    Pancreatic cancer Neg Hx     ALLERGIES:  has No Known Allergies.  MEDICATIONS:  Current Outpatient Medications  Medication Sig Dispense Refill   Continuous Blood Gluc Sensor (FREESTYLE LIBRE 3 SENSOR) MISC 2 Devices by Does not apply route daily. Place 1 sensor on the skin every 14 days. Use to check glucose continuously 2 each 6   lidocaine-prilocaine (EMLA) cream Apply 1 Application topically as needed. 30 g 1   metFORMIN (GLUCOPHAGE-XR) 500 MG 24 hr tablet Take 1 tablet (500 mg total) by mouth 2 (two) times daily with a meal.  180 tablet 1   ondansetron (ZOFRAN) 8 MG tablet One pill every 8 hours as needed for nausea/vomitting. 40 tablet 1   prochlorperazine (COMPAZINE) 10 MG tablet Take 1 tablet (10 mg total) by mouth every 6 (six) hours as needed for nausea or vomiting. 40 tablet 1   Dulaglutide (TRULICITY) 0.75 MG/0.5ML SOPN Inject 0.75 mg into the skin once a week. (Patient not taking: Reported on 10/02/2023) 2 mL 3   ferrous sulfate 325 (65 FE) MG tablet Take 1 tablet (325 mg total) by mouth 2 (two) times daily with a meal. 60 tablet 0   No current facility-administered medications for this visit.    PHYSICAL EXAMINATION:   Vitals:   10/11/23 0805 10/11/23 0823  BP: (!) 150/69 (!) 151/75  Pulse: 80   Temp: (!) 95.2 F (35.1 C)   SpO2: 100%    Filed Weights   10/11/23 0805  Weight: 165 lb (74.8 kg)    Physical Exam Vitals and nursing note reviewed.  HENT:     Head: Normocephalic and atraumatic.     Mouth/Throat:     Pharynx: Oropharynx is clear.  Eyes:     Extraocular Movements: Extraocular movements intact.     Pupils: Pupils are equal, round, and reactive to light.  Cardiovascular:     Rate and Rhythm: Normal rate and regular rhythm.  Pulmonary:     Comments: Decreased breath sounds bilaterally.  Abdominal:     Palpations: Abdomen is soft.  Musculoskeletal:        General: Normal range of motion.     Cervical back: Normal range of motion.  Skin:    General: Skin is warm.  Neurological:     General: No focal deficit present.     Mental Status: He is alert and oriented to person, place, and time.  Psychiatric:        Behavior: Behavior normal.        Judgment: Judgment normal.     LABORATORY DATA:  I have reviewed the data as listed Lab Results  Component Value Date   WBC 6.6 10/11/2023   HGB 12.2 (L) 10/11/2023   HCT 34.7 (L) 10/11/2023   MCV 93.5 10/11/2023   PLT 197 10/11/2023   Recent Labs    03/01/23 1954 03/02/23 0504 03/06/23 0342 03/16/23 1540 07/06/23 0823  08/29/23 1035 09/04/23 1257 09/05/23 0437 10/11/23 0750  NA  --    < > 140   < > 135 137  --  135 137  K  --    < >  4.4   < > 4.4 4.6  --  4.0 4.1  CL  --    < > 105   < > 106 113*  --  105 108  CO2  --    < > 29   < > 22 21*  --  21* 22  GLUCOSE  --    < > 132*   < > 181* 109*  --  106* 160*  BUN  --    < > 12   < > 25* 21  --  14 18  CREATININE  --    < > 0.80   < > 1.04 1.09 1.21 0.98 0.91  CALCIUM  --    < > 9.0   < > 9.1 9.2  --  8.5* 9.2  GFRNONAA  --    < > >60  --  >60 >60 >60 >60 >60  PROT 8.4*   < > 6.9  --  6.9  --   --   --  6.8  ALBUMIN 3.2*   < > 2.7*  --  3.9  --   --   --  4.0  AST 40   < > 40  --  17  --   --   --  17  ALT 48*   < > 65*  --  21  --   --   --  15  ALKPHOS 186*   < > 148*  --  56  --   --   --  53  BILITOT 0.8   < > 0.5  --  0.2*  --   --   --  0.2  BILIDIR 0.2  --   --   --   --   --   --   --   --   IBILI 0.6  --   --   --   --   --   --   --   --    < > = values in this interval not displayed.    RADIOGRAPHIC STUDIES: I have personally reviewed the radiological images as listed and agreed with the findings in the report. IR IMAGING GUIDED PORT INSERTION  Result Date: 09/26/2023 CLINICAL DATA:  Colorectal carcinoma and need for porta cath for chemotherapy. EXAM: IMPLANTED PORT A CATH PLACEMENT WITH ULTRASOUND AND FLUOROSCOPIC GUIDANCE ANESTHESIA/SEDATION: Moderate (conscious) sedation was employed during this procedure. A total of Versed 2.0 mg and Fentanyl 100 mcg was administered intravenously by radiology nursing. Moderate Sedation Time: 31 minutes. The patient's level of consciousness and vital signs were monitored continuously by radiology nursing throughout the procedure under my direct supervision. FLUOROSCOPY: 42 seconds.  4.0 mGy. PROCEDURE: The procedure, risks, benefits, and alternatives were explained to the patient. Questions regarding the procedure were encouraged and answered. The patient understands and consents to the procedure. A  time-out was performed prior to initiating the procedure. Ultrasound was utilized to confirm patency of the right internal jugular vein. An ultrasound image was saved and recorded. The right neck and chest were prepped with chlorhexidine in a sterile fashion, and a sterile drape was applied covering the operative field. Maximum barrier sterile technique with sterile gowns and gloves were used for the procedure. Local anesthesia was provided with 1% lidocaine. After creating a small venotomy incision, a 21 gauge needle was advanced into the right internal jugular vein under direct, real-time ultrasound guidance. Ultrasound image documentation was performed. After securing guidewire access, an 8 Fr dilator was  placed. A J-wire was kinked to measure appropriate catheter length. A subcutaneous port pocket was then created along the upper chest wall utilizing sharp and blunt dissection. Portable cautery was utilized. The pocket was irrigated with sterile saline. A single lumen power injectable port was chosen for placement. The 8 Fr catheter was tunneled from the port pocket site to the venotomy incision. The port was placed in the pocket. External catheter was trimmed to appropriate length based on guidewire measurement. At the venotomy, an 8 Fr peel-away sheath was placed over a guidewire. The catheter was then placed through the sheath and the sheath removed. Final catheter positioning was confirmed and documented with a fluoroscopic spot image. The port was accessed with a needle and aspirated and flushed with heparinized saline. The access needle was removed. The venotomy and port pocket incisions were closed with subcutaneous 3-0 Monocryl and subcuticular 4-0 Vicryl. Dermabond was applied to both incisions. COMPLICATIONS: COMPLICATIONS None FINDINGS: After catheter placement, the tip lies at the cavo-atrial junction. The catheter aspirates normally and is ready for immediate use. IMPRESSION: Placement of single  lumen port a cath via right internal jugular vein. The catheter tip lies at the cavo-atrial junction. A power injectable port a cath was placed and is ready for immediate use. Electronically Signed   By: Irish Lack M.D.   On: 09/26/2023 14:21     Cancer of descending colon (HCC) # OCT 2024-Colon cancer, status post a partial left colectomy 09/04/2023, stage IIIa (pT2 pN1b)-negative resection margins, lymphovascular invasion present, 3/11 lymph nodes, 2 tumor deposits. [Drs.Sherrill; White;Masarouty]. mismatch repair protein expression intact. FOLFOX chemotherapy is given every 2 weeks  # Proceed with FOLFOX #1 of planned 12.  Labs-CBC/chemistries were reviewed with the patient.  # I discussed that FOLFOX chemotherapy is given every 2 weeks; discuss the potential side effects including but not limited to nausea vomiting diarrhea, sores in the mouth, hand-foot syndrome; also tingling and numbness/cold sensitivity with oxaliplatin.  # lung nodules/ [smoker]- CT chest 03/30/2023-bilateral solid pulmonary nodules-likely benign- NOV 11th CT-pending.   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  # IDA- Hb 11- ferritin- on PO iron; hold off venofer for now. Recheck iron studies; and possible venofer.   # Acitive smoker: Recommend quitting smoking.  Continue lung cancer screening-   # IV access port   # DISPOSITION:  # add iron studies; ferritin to labs today.  # FOLFOX-chemo today;  d-3 pump off;   # follow up in 2 weeks- MD; port/labs- cbc/cmp; FOLFOX;possible venofer-  d-3 pump off;    # # follow up in 4  weeks- MD; port/labs-- cbc/cmp;  FOLFOX;possible venofer-  d-3 pump off;  Dr.B   Above plan of care was discussed with patient/family in detail.  My contact information was given to the patient/family.     Earna Coder, MD 10/11/2023 8:50 AM

## 2023-10-11 NOTE — Patient Instructions (Signed)
[facility] - A DEPT OF [facility]  Discharge Instructions: Thank you for choosing [facility] to provide your oncology and hematology care.  If you have a lab appointment with the Cancer Center, please go directly to the Cancer Center and check in at the registration area.  Wear comfortable clothing and clothing appropriate for easy access to any Portacath or PICC line.   We strive to give you quality time with your provider. You may need to reschedule your appointment if you arrive late (15 or more minutes).  Arriving late affects you and other patients whose appointments are after yours.  Also, if you miss three or more appointments without notifying the office, you may be dismissed from the clinic at the provider's discretion.      For prescription refill requests, have your pharmacy contact our office and allow 72 hours for refills to be completed.    Today you received the following chemotherapy and/or immunotherapy agents Leucovorin, Oxaliplatin and Adrucil.      To help prevent nausea and vomiting after your treatment, we encourage you to take your nausea medication as directed.  BELOW ARE SYMPTOMS THAT SHOULD BE REPORTED IMMEDIATELY: *FEVER GREATER THAN 100.4 F (38 C) OR HIGHER *CHILLS OR SWEATING *NAUSEA AND VOMITING THAT IS NOT CONTROLLED WITH YOUR NAUSEA MEDICATION *UNUSUAL SHORTNESS OF BREATH *UNUSUAL BRUISING OR BLEEDING *URINARY PROBLEMS (pain or burning when urinating, or frequent urination) *BOWEL PROBLEMS (unusual diarrhea, constipation, pain near the anus) TENDERNESS IN MOUTH AND THROAT WITH OR WITHOUT PRESENCE OF ULCERS (sore throat, sores in mouth, or a toothache) UNUSUAL RASH, SWELLING OR PAIN  UNUSUAL VAGINAL DISCHARGE OR ITCHING   Items with * indicate a potential emergency and should be followed up as soon as possible or go to the Emergency Department if any problems should occur.  Please show the CHEMOTHERAPY  ALERT CARD or IMMUNOTHERAPY ALERT CARD at check-in to the Emergency Department and triage nurse.  Should you have questions after your visit or need to cancel or reschedule your appointment, please contact [facility] - A DEPT OF [facility]  [phone] and follow the prompts.  Office hours are 8:00 a.m. to 4:30 p.m. Monday - Friday. Please note that voicemails left after 4:00 p.m. may not be returned until the following business day.  We are closed weekends and major holidays. You have access to a nurse at all times for urgent questions. Please call the main number to the clinic [phone] and follow the prompts.  For any non-urgent questions, you may also contact your provider using MyChart. We now offer e-Visits for anyone 59 and older to request care online for non-urgent symptoms. For details visit [email or web address].   Also download the MyChart app! Go to the app store, search "MyChart", open the app, select [place], and log in with your MyChart username and password.

## 2023-10-12 ENCOUNTER — Telehealth: Payer: Self-pay

## 2023-10-12 NOTE — Telephone Encounter (Signed)
Telephone call to patient for follow up after receiving first infusion.   Patient states infusion went great.  States eating good and drinking plenty of fluids.   Denies any nausea or vomiting.  Encouraged patient to call for any concerns or questions. 

## 2023-10-13 ENCOUNTER — Inpatient Hospital Stay: Payer: Medicare PPO

## 2023-10-13 VITALS — BP 136/72 | HR 61 | Temp 99.1°F | Resp 18

## 2023-10-13 DIAGNOSIS — C186 Malignant neoplasm of descending colon: Secondary | ICD-10-CM

## 2023-10-13 DIAGNOSIS — Z5111 Encounter for antineoplastic chemotherapy: Secondary | ICD-10-CM | POA: Diagnosis not present

## 2023-10-13 MED ORDER — HEPARIN SOD (PORK) LOCK FLUSH 100 UNIT/ML IV SOLN
500.0000 [IU] | Freq: Once | INTRAVENOUS | Status: AC | PRN
  Administered 2023-10-13: 500 [IU]
  Filled 2023-10-13: qty 5

## 2023-10-13 MED ORDER — SODIUM CHLORIDE 0.9% FLUSH
10.0000 mL | INTRAVENOUS | Status: DC | PRN
  Administered 2023-10-13: 10 mL
  Filled 2023-10-13: qty 10

## 2023-10-23 ENCOUNTER — Telehealth: Payer: Self-pay | Admitting: Acute Care

## 2023-10-23 NOTE — Telephone Encounter (Signed)
Pt is calling to get someone to review his LDCT, Pt calling for results

## 2023-10-23 NOTE — Telephone Encounter (Signed)
Returned call. Advised no results at this time. Advised staff will return call once received.

## 2023-10-24 ENCOUNTER — Other Ambulatory Visit: Payer: Self-pay | Admitting: Family Medicine

## 2023-10-30 ENCOUNTER — Inpatient Hospital Stay: Payer: Medicare PPO | Admitting: Internal Medicine

## 2023-10-30 ENCOUNTER — Inpatient Hospital Stay: Payer: Medicare PPO

## 2023-10-30 ENCOUNTER — Inpatient Hospital Stay: Payer: Medicare PPO | Attending: Oncology

## 2023-10-30 ENCOUNTER — Other Ambulatory Visit: Payer: Self-pay

## 2023-10-30 ENCOUNTER — Encounter: Payer: Self-pay | Admitting: Internal Medicine

## 2023-10-30 ENCOUNTER — Telehealth: Payer: Self-pay | Admitting: Radiation Oncology

## 2023-10-30 VITALS — BP 137/86 | HR 70 | Temp 98.5°F | Resp 18

## 2023-10-30 VITALS — BP 137/82 | HR 84 | Temp 96.6°F | Ht 73.0 in | Wt 169.0 lb

## 2023-10-30 DIAGNOSIS — Z122 Encounter for screening for malignant neoplasm of respiratory organs: Secondary | ICD-10-CM

## 2023-10-30 DIAGNOSIS — C61 Malignant neoplasm of prostate: Secondary | ICD-10-CM | POA: Diagnosis present

## 2023-10-30 DIAGNOSIS — F17210 Nicotine dependence, cigarettes, uncomplicated: Secondary | ICD-10-CM | POA: Diagnosis not present

## 2023-10-30 DIAGNOSIS — C186 Malignant neoplasm of descending colon: Secondary | ICD-10-CM | POA: Diagnosis present

## 2023-10-30 DIAGNOSIS — Z87891 Personal history of nicotine dependence: Secondary | ICD-10-CM

## 2023-10-30 DIAGNOSIS — Z5111 Encounter for antineoplastic chemotherapy: Secondary | ICD-10-CM | POA: Diagnosis present

## 2023-10-30 LAB — CMP (CANCER CENTER ONLY)
ALT: 20 U/L (ref 0–44)
AST: 23 U/L (ref 15–41)
Albumin: 3.9 g/dL (ref 3.5–5.0)
Alkaline Phosphatase: 50 U/L (ref 38–126)
Anion gap: 8 (ref 5–15)
BUN: 16 mg/dL (ref 8–23)
CO2: 23 mmol/L (ref 22–32)
Calcium: 9.2 mg/dL (ref 8.9–10.3)
Chloride: 107 mmol/L (ref 98–111)
Creatinine: 0.98 mg/dL (ref 0.61–1.24)
GFR, Estimated: >60 mL/min (ref >60)
Glucose, Bld: 168 mg/dL — ABNORMAL HIGH (ref 70–99)
Potassium: 4.1 mmol/L (ref 3.5–5.1)
Sodium: 138 mmol/L (ref 135–145)
Total Bilirubin: 0.3 mg/dL (ref <1.2)
Total Protein: 6.6 g/dL (ref 6.5–8.1)

## 2023-10-30 LAB — CBC WITH DIFFERENTIAL (CANCER CENTER ONLY)
Abs Immature Granulocytes: 0.03 10*3/uL (ref 0.00–0.07)
Basophils Absolute: 0 10*3/uL (ref 0.0–0.1)
Basophils Relative: 1 %
Eosinophils Absolute: 0.4 10*3/uL (ref 0.0–0.5)
Eosinophils Relative: 6 %
HCT: 37.1 % — ABNORMAL LOW (ref 39.0–52.0)
Hemoglobin: 12.5 g/dL — ABNORMAL LOW (ref 13.0–17.0)
Immature Granulocytes: 1 %
Lymphocytes Relative: 29 %
Lymphs Abs: 1.6 10*3/uL (ref 0.7–4.0)
MCH: 32.2 pg (ref 26.0–34.0)
MCHC: 33.7 g/dL (ref 30.0–36.0)
MCV: 95.6 fL (ref 80.0–100.0)
Monocytes Absolute: 0.8 10*3/uL (ref 0.1–1.0)
Monocytes Relative: 14 %
Neutro Abs: 2.8 10*3/uL (ref 1.7–7.7)
Neutrophils Relative %: 49 %
Platelet Count: 174 10*3/uL (ref 150–400)
RBC: 3.88 MIL/uL — ABNORMAL LOW (ref 4.22–5.81)
RDW: 12 % (ref 11.5–15.5)
WBC Count: 5.6 10*3/uL (ref 4.0–10.5)
nRBC: 0 % (ref 0.0–0.2)

## 2023-10-30 MED ORDER — DEXAMETHASONE SODIUM PHOSPHATE 10 MG/ML IJ SOLN
10.0000 mg | Freq: Once | INTRAMUSCULAR | Status: AC
  Administered 2023-10-30: 10 mg via INTRAVENOUS
  Filled 2023-10-30: qty 1

## 2023-10-30 MED ORDER — SODIUM CHLORIDE 0.9 % IV SOLN
INTRAVENOUS | Status: DC
  Filled 2023-10-30: qty 250

## 2023-10-30 MED ORDER — FLUOROURACIL CHEMO INJECTION 2.5 GM/50ML
400.0000 mg/m2 | Freq: Once | INTRAVENOUS | Status: AC
  Administered 2023-10-30: 800 mg via INTRAVENOUS
  Filled 2023-10-30: qty 16

## 2023-10-30 MED ORDER — SODIUM CHLORIDE 0.9 % IV SOLN
2400.0000 mg/m2 | INTRAVENOUS | Status: DC
  Administered 2023-10-30: 5000 mg via INTRAVENOUS
  Filled 2023-10-30: qty 100

## 2023-10-30 MED ORDER — PALONOSETRON HCL INJECTION 0.25 MG/5ML
0.2500 mg | Freq: Once | INTRAVENOUS | Status: AC
  Administered 2023-10-30: 0.25 mg via INTRAVENOUS
  Filled 2023-10-30: qty 5

## 2023-10-30 MED ORDER — DEXTROSE 5 % IV SOLN
INTRAVENOUS | Status: DC
  Filled 2023-10-30: qty 250

## 2023-10-30 MED ORDER — LEUCOVORIN CALCIUM INJECTION 350 MG
400.0000 mg/m2 | Freq: Once | INTRAMUSCULAR | Status: AC
  Administered 2023-10-30: 796 mg via INTRAVENOUS
  Filled 2023-10-30: qty 39.8

## 2023-10-30 MED ORDER — OXALIPLATIN CHEMO INJECTION 100 MG/20ML
85.0000 mg/m2 | Freq: Once | INTRAVENOUS | Status: AC
  Administered 2023-10-30: 170 mg via INTRAVENOUS
  Filled 2023-10-30: qty 34

## 2023-10-30 NOTE — Progress Notes (Signed)
Dad is in [facility] with lymphoma. Not doing good.  CT chest 10/09/23, ordered by Kandice Robinsons, FNP. I will call to try to get results read.

## 2023-10-30 NOTE — Telephone Encounter (Signed)
This patient has appointment for PSA lab on 12/12. He also has treatment on 12/16. Can labs be done on 12/16 to keep from coming 2 times?

## 2023-10-30 NOTE — Assessment & Plan Note (Addendum)
#   OCT 2024-Colon cancer, status post a partial left colectomy 09/04/2023, stage IIIa (pT2 pN1b)-negative resection margins, lymphovascular invasion present, 3/11 lymph nodes, 2 tumor deposits. [Drs.Sherrill; White;Masarouty]. mismatch repair protein expression intact. FOLFOX chemotherapy is given every 2 weeks  # Proceed with FOLFOX #2  of planned 12.  Labs-CBC/chemistries were reviewed with the patient.  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   # lung nodules/ [smoker]- CT chest 03/30/2023-bilateral solid pulmonary nodules-likely benign- NOV 11th CT-pending.   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   # Diabetes- Trulicity/metformin- CGM-monitor closely on chemotherapy/steroids- stable.   # IDA- Hb 11- ferritin- on PO iron; hold off venofer for now. Recheck iron studies; and possible venofer.   # Acitive smoker: Recommend quitting smoking.  Continue lung cancer screening-   # IV access port   # DISPOSITION:  # FOLFOX-chemo today;  HOLD off venofer- d-3 pump off;   # follow up in 2 weeks- MD; port/labs- cbc/cmp; FOLFOX; d-3 pump off;    # # follow up in 4  weeks- X- MD; port/labs-- cbc/cmp; FOLFOX;possible   d-3 pump off;  Dr.B

## 2023-10-30 NOTE — Patient Instructions (Signed)
[facility] - A DEPT OF [facility]  Discharge Instructions: Thank you for choosing [facility] to provide your oncology and hematology care.  If you have a lab appointment with the Cancer Center, please go directly to the Cancer Center and check in at the registration area.  Wear comfortable clothing and clothing appropriate for easy access to any Portacath or PICC line.   We strive to give you quality time with your provider. You may need to reschedule your appointment if you arrive late (15 or more minutes).  Arriving late affects you and other patients whose appointments are after yours.  Also, if you miss three or more appointments without notifying the office, you may be dismissed from the clinic at the provider's discretion.      For prescription refill requests, have your pharmacy contact our office and allow 72 hours for refills to be completed.    Today you received the following chemotherapy and/or immunotherapy agents oxaliplatin, leucovorin, and adrucil      To help prevent nausea and vomiting after your treatment, we encourage you to take your nausea medication as directed.  BELOW ARE SYMPTOMS THAT SHOULD BE REPORTED IMMEDIATELY: *FEVER GREATER THAN 100.4 F (38 C) OR HIGHER *CHILLS OR SWEATING *NAUSEA AND VOMITING THAT IS NOT CONTROLLED WITH YOUR NAUSEA MEDICATION *UNUSUAL SHORTNESS OF BREATH *UNUSUAL BRUISING OR BLEEDING *URINARY PROBLEMS (pain or burning when urinating, or frequent urination) *BOWEL PROBLEMS (unusual diarrhea, constipation, pain near the anus) TENDERNESS IN MOUTH AND THROAT WITH OR WITHOUT PRESENCE OF ULCERS (sore throat, sores in mouth, or a toothache) UNUSUAL RASH, SWELLING OR PAIN  UNUSUAL VAGINAL DISCHARGE OR ITCHING   Items with * indicate a potential emergency and should be followed up as soon as possible or go to the Emergency Department if any problems should occur.  Please show the CHEMOTHERAPY  ALERT CARD or IMMUNOTHERAPY ALERT CARD at check-in to the Emergency Department and triage nurse.  Should you have questions after your visit or need to cancel or reschedule your appointment, please contact [facility] - A DEPT OF [facility]  [phone] and follow the prompts.  Office hours are 8:00 a.m. to 4:30 p.m. Monday - Friday. Please note that voicemails left after 4:00 p.m. may not be returned until the following business day.  We are closed weekends and major holidays. You have access to a nurse at all times for urgent questions. Please call the main number to the clinic [phone] and follow the prompts.  For any non-urgent questions, you may also contact your provider using MyChart. We now offer e-Visits for anyone 54 and older to request care online for non-urgent symptoms. For details visit [email or web address].   Also download the MyChart app! Go to the app store, search "MyChart", open the app, select , and log in with your MyChart username and password.

## 2023-10-30 NOTE — Telephone Encounter (Signed)
Radiation Onc will cancel lab on the 12th.

## 2023-10-30 NOTE — Progress Notes (Signed)
[facility] CONSULT NOTE  Patient Care Team: Ronnald Ramp, MD as PCP - General (Family Medicine) Benita Gutter, RN as Oncology Nurse Navigator Earna Coder, MD as Consulting Physician (Oncology)  CHIEF COMPLAINTS/PURPOSE OF CONSULTATION: Colon cancer  Oncology History Overview Note     Colon cancer, status post a partial left colectomy 09/04/2023, stage IIIa (pT2 pN1b) CT abdomen/pelvis 03/01/2023-prostate/pelvic abscess, large amount of colonic stool CT chest 03/30/2023-bilateral solid pulmonary nodules-likely benign, 65-month follow-up CT recommended Colonoscopy 04/12/2023-multiple polyps removed, greater than 50 mm polyp in the distal ascending colon and sigmoid colon not removed Colonoscopy 06/29/2023 greater than 50 mm polyps removed from the ascending, transverse, and descending colon-tubular adenomas, sigmoid lesion at 35 cm incompletely resected with biopsy revealing an ulcerated tubular adenoma with at least high-grade dysplasia and suspicious for invasive or [place] Descending colon resection 09/04/2023, pT2, PN 1B moderately differentiated adenocarcinoma, negative resection margins, lymphovascular invasion present, 3/11 lymph nodes, 2 tumor deposits, mismatch repair protein expression intact, MSS  Multiple colon polyps on colonoscopy 04/12/2023 and 06/29/2023-tubular adenomas Prostate cancer-Gleason 7 acinar adenocarcinoma involving 5-10% of submitted tissue from a TUR specimen 03/03/2023 Prostate 03/20/2023-mild prostamegaly- PI-RADS category 5 lesion in the left peripheral zone and category 4 lesion in the left anterior peripheral zone and left anterior fibromuscular stroma, reduced size of prostate abscess with continued surrounding prostatitis and periprostatic stranding IMRT 20 6/24 - 07/20/2023  4.  Diabetes 5.  History of colon cancer-negative CancerNext expanded panel 08/29/2023 6.  History of lung nodules, ongoing tobacco use, followed in the lung  cancer screening clinic# prostate/pelvic abscess.  He was admitted for antibiotics and drainage of the pelvic abscess 03/01/2023. A TUR and was found to have a Gleason 7 prostate cancer involving 5-10% of the submitted tissue.  He was referred to Dr. Rushie Chestnut and completed external beam radiation to the prostate.  # MAY 2024 [screening colo]- A greater than 50 mm polyp was removed from the proximal ascending colon.  Additional greater than 50 m polyps were not removed from the distal ascending and sigmoid colon.  Additional smaller polyps were removed from the colon. The pathology revealed multiple fragments of tubular adenomas. Dr. Baird Lyons than 50 mm polyps were removed from the ascending, transverse, and descending colon.  A polypoid lesion at 35 cm was incompletely resected and tattooed.  The pathology from multiple polyps returned as tubular adenomas without high-grade dysplasia or carcinoma.  # OCTOBER 2024- STAGE III SIGMOID COLON CANCER [Dr.Sherril/Dr.White- [place]] pathology from the sigmoid resection returned as invasive moderately differentiated adenocarcinoma involving the descending colon.  Carcinoma invades into but not through the muscularis propria.  The resection margins are negative.  Lymphovascular invasion is present.  Metastatic carcinoma was identified in 3 of 11 lymph nodes and there were 2 tumor deposits.  Mismatch repair protein expression is intact.   # Multiple colon polyps on colonoscopy 04/12/2023 and 06/29/2023-tubular adenomas- s/p  Genetics counseling: negative CancerNext expanded panel 08/29/2023  # NOV 4th, 2024- FOLFOX q 2 w x 6 months.   # APRIL 2024-  Prostate cancer [s/p EBRT- Dr.Chrystal]-    Cancer of descending colon (HCC)  09/19/2023 Initial Diagnosis   Cancer of descending colon (HCC)   09/19/2023 Cancer Staging   Staging form: Colon and Rectum, AJCC 8th Edition - Pathologic: pT2, cM0 - Signed by Ladene Artist, MD on 09/19/2023 Histologic grading  system: 4 grade system Histologic grade (G): G2 Residual tumor (R): R0 - None Laterality: Left Lymph-vascular invasion (LVI): LVI  present/identified, NOS Tumor deposits (TD): Present Perineural invasion (PNI): Absent Microsatellite instability (MSI): Stable   09/19/2023 Cancer Staging   Staging form: Colon and Rectum, AJCC 8th Edition - Pathologic: Stage IIIA (pT2, pN1b, cM0) - Signed by Ladene Artist, MD on 09/19/2023   10/11/2023 -  Chemotherapy   Patient is on Treatment Plan : COLORECTAL FOLFOX q14d x 6 months       HISTORY OF PRESENTING ILLNESS: Patient ambulating-independently.  Alone.  Colin Griffin 72 y.o.  male pleasant patient with  stage III colon cancer s/p left hemicolectomy currently on adjuvant chemotherapy.   Dad is in [facility] with lymphoma.  Mild tingling and numbness of the chemotherapy.  Otherwise currently resolved.  Denies any blood in stools or black-colored stools.  No nausea no vomiting.   Review of Systems  Constitutional:  Negative for chills, diaphoresis, fever, malaise/fatigue and weight loss.  HENT:  Negative for nosebleeds and sore throat.   Eyes:  Negative for double vision.  Respiratory:  Negative for cough, hemoptysis, sputum production, shortness of breath and wheezing.   Cardiovascular:  Negative for chest pain, palpitations, orthopnea and leg swelling.  Gastrointestinal:  Negative for abdominal pain, blood in stool, constipation, diarrhea, heartburn, melena, nausea and vomiting.  Genitourinary:  Negative for dysuria, frequency and urgency.  Musculoskeletal:  Negative for back pain and joint pain.  Skin: Negative.  Negative for itching and rash.  Neurological:  Negative for dizziness, tingling, focal weakness, weakness and headaches.  Endo/Heme/Allergies:  Does not bruise/bleed easily.  Psychiatric/Behavioral:  Negative for depression. The patient is not nervous/anxious and does not have insomnia.     MEDICAL HISTORY:  Past Medical History:   Diagnosis Date   Cancer [facility]) April 2024   Constipation 03/04/2023   Diabetes mellitus without complication [facility]) March 2024    SURGICAL HISTORY: Past Surgical History:  Procedure Laterality Date   APPENDECTOMY     BIOPSY  06/29/2023   Procedure: BIOPSY;  Surgeon: Lemar Lofty., MD;  Location: [facility];  Service: Gastroenterology;;   COLONOSCOPY WITH PROPOFOL N/A 04/12/2023   Procedure: COLONOSCOPY WITH PROPOFOL;  Surgeon: [name], MD;  Location: [facility];  Service: Gastroenterology;  Laterality: N/A;   COLONOSCOPY WITH PROPOFOL N/A 06/29/2023   Procedure: COLONOSCOPY WITH PROPOFOL;  Surgeon: [name] Netty Starring., MD;  Location: [facility];  Service: Gastroenterology;  Laterality: N/A;   ENDOSCOPIC MUCOSAL RESECTION N/A 06/29/2023   Procedure: ENDOSCOPIC MUCOSAL RESECTION;  Surgeon: [name] Netty Starring., MD;  Location: [facility];  Service: Gastroenterology;  Laterality: N/A;   FLEXIBLE SIGMOIDOSCOPY N/A 09/04/2023   Procedure: FLEXIBLE SIGMOIDOSCOPY;  Surgeon: Andria Meuse, MD;  Location: [facility];  Service: General;  Laterality: N/A;   HEMOSTASIS CONTROL  06/29/2023   Procedure: HEMOSTASIS CONTROL;  Surgeon: Lemar Lofty., MD;  Location: [facility];  Service: Gastroenterology;;   IR IMAGING GUIDED PORT INSERTION  09/26/2023   IR RADIOLOGIST EVAL & MGMT  03/16/2023   PELVIC ABCESS DRAINAGE     March 2024   POLYPECTOMY  06/29/2023   Procedure: POLYPECTOMY;  Surgeon: [name] Netty Starring., MD;  Location: [facility];  Service: Gastroenterology;;   Sunnie Nielsen LIFTING INJECTION  06/29/2023   Procedure: SUBMUCOSAL LIFTING INJECTION;  Surgeon: Lemar Lofty., MD;  Location: [facility];  Service: Gastroenterology;;   SUBMUCOSAL TATTOO INJECTION  06/29/2023   Procedure: SUBMUCOSAL TATTOO INJECTION;  Surgeon: Lemar Lofty., MD;  Location: [facility];  Service: Gastroenterology;;   TRANSURETHRAL RESECTION OF PROSTATE N/A  03/03/2023  Procedure: TRANSURETHRAL RESECTION OF THE PROSTATE (TURP)/ UNROOFING OF PROSTATE ABSCESS;  Surgeon: Riki Altes, MD;  Location: [facility];  Service: Urology;  Laterality: N/A;    SOCIAL HISTORY: Social History   Socioeconomic History   Marital status: Married    Spouse name: Editor, commissioning   Number of children: 1   Years of education: Not on file   Highest education level: Master's degree (e.g., MA, MS, MEng, MEd, MSW, MBA)  Occupational History   Not on file  Tobacco Use   Smoking status: Every Day    Current packs/day: 1.00    Average packs/day: 1 pack/day for 54.0 years (54.0 ttl pk-yrs)    Types: Cigarettes   Smokeless tobacco: Never   Tobacco comments:    Reports that at one time he smoked 3 packs per day while in the [employer]  Vaping Use   Vaping status: Every Day   Start date: 08/08/2022   Substances: Flavoring  Substance and Sexual Activity   Alcohol use: Not Currently   Drug use: Never   Sexual activity: Not Currently    Birth control/protection: None  Other Topics Concern   Not on file  Social History Narrative   ** Merged History Encounter **       Retired from Holiday representative for 30 years    Social Determinants of Corporate investment banker Strain: Low Risk  (05/01/2023)   Overall Financial Resource Strain (CARDIA)    Difficulty of Paying Living Expenses: Not hard at all  Food Insecurity: No Food Insecurity (09/19/2023)   Hunger Vital Sign    Worried About Running Out of Food in the Last Year: Never true    Ran Out of Food in the Last Year: Never true  Transportation Needs: No Transportation Needs (09/19/2023)   PRAPARE - Administrator, Civil Service (Medical): No    Lack of Transportation (Non-Medical): No  Physical Activity: Sufficiently Active (05/01/2023)   Exercise Vital Sign    Days of Exercise per Week: 6 days    Minutes of Exercise per Session: 150+ min  Stress: No Stress Concern Present (05/01/2023)   [employer] of Occupational Health - Occupational Stress Questionnaire    Feeling of Stress : Not at all  Social Connections: Moderately Integrated (05/01/2023)   Social Connection and Isolation Panel [NHANES]    Frequency of Communication with Friends and Family: Three times a week    Frequency of Social Gatherings with Friends and Family: Twice a week    Attends Religious Services: More than 4 times per year    Active Member of [employer] or Organizations: No    Attends Banker Meetings: Patient declined    Marital Status: Married  Catering manager Violence: Not At Risk (09/19/2023)   Humiliation, Afraid, Rape, and Kick questionnaire    Fear of Current or Ex-Partner: No    Emotionally Abused: No    Physically Abused: No    Sexually Abused: No    FAMILY HISTORY: Family History  Problem Relation Age of Onset   Atrial fibrillation Mother    Skin cancer Father        non-melanoma   Colon cancer Sister 62       Lynch Syndrome   Breast cancer Sister 49   Diabetes Brother    Throat cancer Maternal Uncle    Breast cancer Paternal Aunt 29 - 84   Colon cancer Maternal Grandmother 47   Alcohol abuse Maternal Grandfather  Breast cancer Paternal Grandmother 102   Prostate cancer Paternal Grandfather 29 - 48   Other Niece        2 nieces have Lynch Syndrome (brother's daughters)   Diabetes Other    Ovarian cancer Other 70       paternal first cousin once removed   Colon polyps Neg Hx    Stomach cancer Neg Hx    Esophageal cancer Neg Hx    Inflammatory bowel disease Neg Hx    Liver disease Neg Hx    Pancreatic cancer Neg Hx     ALLERGIES:  has No Known Allergies.  MEDICATIONS:  Current Outpatient Medications  Medication Sig Dispense Refill   Continuous Blood Gluc Sensor (FREESTYLE LIBRE 3 SENSOR) MISC 2 Devices by Does not apply route daily. Place 1 sensor on the skin every 14 days. Use to check glucose continuously 2 each 6   Dulaglutide (TRULICITY) 0.75 MG/0.5ML SOPN  Inject 0.75 mg into the skin once a week. 2 mL 3   lidocaine-prilocaine (EMLA) cream Apply 1 Application topically as needed. 30 g 1   metFORMIN (GLUCOPHAGE-XR) 500 MG 24 hr tablet TAKE 1 TABLET BY MOUTH EVERY DAY WITH BREAKFAST 90 tablet 1   ondansetron (ZOFRAN) 8 MG tablet One pill every 8 hours as needed for nausea/vomitting. 40 tablet 1   prochlorperazine (COMPAZINE) 10 MG tablet Take 1 tablet (10 mg total) by mouth every 6 (six) hours as needed for nausea or vomiting. 40 tablet 1   ferrous sulfate 325 (65 FE) MG tablet Take 1 tablet (325 mg total) by mouth 2 (two) times daily with a meal. 60 tablet 0   No current facility-administered medications for this visit.   Facility-Administered Medications Ordered in Other Visits  Medication Dose Route Frequency Provider Last Rate Last Admin   0.9 %  sodium chloride infusion   Intravenous Continuous Donneta Romberg, Worthy Flank, MD       dexamethasone (DECADRON) injection 10 mg  10 mg Intravenous Once Louretta Shorten R, MD       dextrose 5 % solution   Intravenous Continuous Donneta Romberg, Worthy Flank, MD       fluorouracil (ADRUCIL) 5,000 mg in sodium chloride 0.9 % 150 mL chemo infusion  2,400 mg/m2 (Treatment Plan Recorded) Intravenous 1 day or 1 dose Earna Coder, MD       fluorouracil (ADRUCIL) chemo injection 800 mg  400 mg/m2 (Treatment Plan Recorded) Intravenous Once Earna Coder, MD       leucovorin 796 mg in dextrose 5 % 250 mL infusion  400 mg/m2 (Treatment Plan Recorded) Intravenous Once Earna Coder, MD       oxaliplatin (ELOXATIN) 170 mg in dextrose 5 % 500 mL chemo infusion  85 mg/m2 (Treatment Plan Recorded) Intravenous Once Earna Coder, MD       palonosetron (ALOXI) injection 0.25 mg  0.25 mg Intravenous Once Earna Coder, MD        PHYSICAL EXAMINATION:   Vitals:   10/30/23 0802  BP: 137/82  Pulse: 84  Temp: (!) 96.6 F (35.9 C)  SpO2: 100%   Filed Weights   10/30/23 0802  Weight:  169 lb (76.7 kg)    Physical Exam Vitals and nursing note reviewed.  HENT:     Head: Normocephalic and atraumatic.     Mouth/Throat:     Pharynx: Oropharynx is clear.  Eyes:     Extraocular Movements: Extraocular movements intact.     Pupils: Pupils are equal, round,  and reactive to light.  Cardiovascular:     Rate and Rhythm: Normal rate and regular rhythm.  Pulmonary:     Comments: Decreased breath sounds bilaterally.  Abdominal:     Palpations: Abdomen is soft.  Musculoskeletal:        General: Normal range of motion.     Cervical back: Normal range of motion.  Skin:    General: Skin is warm.  Neurological:     General: No focal deficit present.     Mental Status: He is alert and oriented to person, place, and time.  Psychiatric:        Behavior: Behavior normal.        Judgment: Judgment normal.     LABORATORY DATA:  I have reviewed the data as listed Lab Results  Component Value Date   WBC 5.6 10/30/2023   HGB 12.5 (L) 10/30/2023   HCT 37.1 (L) 10/30/2023   MCV 95.6 10/30/2023   PLT 174 10/30/2023   Recent Labs    03/01/23 1954 03/02/23 0504 07/06/23 0823 08/29/23 1035 09/05/23 0437 10/11/23 0750 10/30/23 0758  NA  --    < > 135   < > 135 137 138  K  --    < > 4.4   < > 4.0 4.1 4.1  CL  --    < > 106   < > 105 108 107  CO2  --    < > 22   < > 21* 22 23  GLUCOSE  --    < > 181*   < > 106* 160* 168*  BUN  --    < > 25*   < > 14 18 16   CREATININE  --    < > 1.04   < > 0.98 0.91 0.98  CALCIUM  --    < > 9.1   < > 8.5* 9.2 9.2  GFRNONAA  --    < > >60   < > >60 >60 >60  PROT 8.4*   < > 6.9  --   --  6.8 6.6  ALBUMIN 3.2*   < > 3.9  --   --  4.0 3.9  AST 40   < > 17  --   --  17 23  ALT 48*   < > 21  --   --  15 20  ALKPHOS 186*   < > 56  --   --  53 50  BILITOT 0.8   < > 0.2*  --   --  0.2 0.3  BILIDIR 0.2  --   --   --   --   --   --   IBILI 0.6  --   --   --   --   --   --    < > = values in this interval not displayed.    RADIOGRAPHIC  STUDIES: I have personally reviewed the radiological images as listed and agreed with the findings in the report. No results found.   Cancer of descending colon (HCC) # OCT 2024-Colon cancer, status post a partial left colectomy 09/04/2023, stage IIIa (pT2 pN1b)-negative resection margins, lymphovascular invasion present, 3/11 lymph nodes, 2 tumor deposits. [Drs.Sherrill; White;Masarouty]. mismatch repair protein expression intact. FOLFOX chemotherapy is given every 2 weeks  # Proceed with FOLFOX #2  of planned 12.  Labs-CBC/chemistries were reviewed with the patient.  # lung nodules/ [smoker]- CT chest 03/30/2023-bilateral solid pulmonary nodules-likely benign- NOV 11th CT-pending.   # Prostate  cancer [Dr.Stoioff]-Gleason 7 acinar adenocarcinoma involving 5-10% of submitted tissue from a TUR specimen 03/03/2023; s/p EBRT- IMRT 20 6/24 - 07/20/2023- stable.   # Diabetes- Trulicity/metformin- CGM-monitor closely on chemotherapy/steroids- stable.   # IDA- Hb 11- ferritin- on PO iron; hold off venofer for now. Recheck iron studies; and possible venofer.   # Acitive smoker: Recommend quitting smoking.  Continue lung cancer screening-   # IV access port   # DISPOSITION:  # FOLFOX-chemo today;  HOLD off venofer- d-3 pump off;   # follow up in 2 weeks- MD; port/labs- cbc/cmp; FOLFOX; d-3 pump off;    # # follow up in 4  weeks- X- MD; port/labs-- cbc/cmp; FOLFOX;possible venofer-  d-3 pump off;  Dr.B   Above plan of care was discussed with patient/family in detail.  My contact information was given to the patient/family.     Earna Coder, MD 10/30/2023 9:14 AM

## 2023-11-01 ENCOUNTER — Inpatient Hospital Stay: Payer: Medicare PPO

## 2023-11-01 VITALS — BP 121/79 | HR 88 | Resp 18

## 2023-11-01 DIAGNOSIS — Z5111 Encounter for antineoplastic chemotherapy: Secondary | ICD-10-CM | POA: Diagnosis not present

## 2023-11-01 DIAGNOSIS — C186 Malignant neoplasm of descending colon: Secondary | ICD-10-CM

## 2023-11-01 MED ORDER — HEPARIN SOD (PORK) LOCK FLUSH 100 UNIT/ML IV SOLN
500.0000 [IU] | Freq: Once | INTRAVENOUS | Status: AC | PRN
  Administered 2023-11-01: 500 [IU]
  Filled 2023-11-01: qty 5

## 2023-11-01 MED ORDER — SODIUM CHLORIDE 0.9% FLUSH
10.0000 mL | INTRAVENOUS | Status: DC | PRN
  Administered 2023-11-01: 10 mL
  Filled 2023-11-01: qty 10

## 2023-11-02 ENCOUNTER — Telehealth: Payer: Self-pay | Admitting: *Deleted

## 2023-11-02 NOTE — Telephone Encounter (Signed)
Patient called reporting that his wife was just diagnosed with COVID and he just finished chemotherapy treatment. (FOLFOX) pump removed yesterday. He is asking if he needs to do anything. Please advise

## 2023-11-02 NOTE — Telephone Encounter (Signed)
Call returned to patient and he states that he has no symptoms, I advised that he test himself and retest again in a few days and for him and his wife to stay on opposite ends of the house and use separate toilets and to wash his hands frequently. He agrees to all of this and he was also advised to let us know if he tests positive. He agrees to this

## 2023-11-09 ENCOUNTER — Other Ambulatory Visit: Payer: Medicare PPO

## 2023-11-13 ENCOUNTER — Encounter: Payer: Self-pay | Admitting: Internal Medicine

## 2023-11-13 ENCOUNTER — Inpatient Hospital Stay: Payer: Medicare PPO

## 2023-11-13 ENCOUNTER — Inpatient Hospital Stay (HOSPITAL_BASED_OUTPATIENT_CLINIC_OR_DEPARTMENT_OTHER): Payer: Medicare PPO | Admitting: Internal Medicine

## 2023-11-13 VITALS — BP 147/90 | HR 62 | Temp 98.7°F | Resp 19

## 2023-11-13 VITALS — BP 144/92 | HR 71 | Temp 97.6°F | Wt 169.0 lb

## 2023-11-13 DIAGNOSIS — C186 Malignant neoplasm of descending colon: Secondary | ICD-10-CM

## 2023-11-13 DIAGNOSIS — Z5111 Encounter for antineoplastic chemotherapy: Secondary | ICD-10-CM | POA: Diagnosis not present

## 2023-11-13 LAB — CMP (CANCER CENTER ONLY)
ALT: 27 U/L (ref 0–44)
AST: 25 U/L (ref 15–41)
Albumin: 3.7 g/dL (ref 3.5–5.0)
Alkaline Phosphatase: 55 U/L (ref 38–126)
Anion gap: 8 (ref 5–15)
BUN: 13 mg/dL (ref 8–23)
CO2: 24 mmol/L (ref 22–32)
Calcium: 9.5 mg/dL (ref 8.9–10.3)
Chloride: 109 mmol/L (ref 98–111)
Creatinine: 1.05 mg/dL (ref 0.61–1.24)
GFR, Estimated: >60 mL/min (ref >60)
Glucose, Bld: 149 mg/dL — ABNORMAL HIGH (ref 70–99)
Potassium: 4.2 mmol/L (ref 3.5–5.1)
Sodium: 141 mmol/L (ref 135–145)
Total Bilirubin: 0.3 mg/dL (ref <1.2)
Total Protein: 6.6 g/dL (ref 6.5–8.1)

## 2023-11-13 LAB — CBC WITH DIFFERENTIAL (CANCER CENTER ONLY)
Abs Immature Granulocytes: 0.01 10*3/uL (ref 0.00–0.07)
Basophils Absolute: 0 10*3/uL (ref 0.0–0.1)
Basophils Relative: 0 %
Eosinophils Absolute: 0.3 10*3/uL (ref 0.0–0.5)
Eosinophils Relative: 5 %
HCT: 33.8 % — ABNORMAL LOW (ref 39.0–52.0)
Hemoglobin: 11.5 g/dL — ABNORMAL LOW (ref 13.0–17.0)
Immature Granulocytes: 0 %
Lymphocytes Relative: 35 %
Lymphs Abs: 1.8 10*3/uL (ref 0.7–4.0)
MCH: 31.8 pg (ref 26.0–34.0)
MCHC: 34 g/dL (ref 30.0–36.0)
MCV: 93.4 fL (ref 80.0–100.0)
Monocytes Absolute: 0.7 10*3/uL (ref 0.1–1.0)
Monocytes Relative: 13 %
Neutro Abs: 2.3 10*3/uL (ref 1.7–7.7)
Neutrophils Relative %: 47 %
Platelet Count: 136 10*3/uL — ABNORMAL LOW (ref 150–400)
RBC: 3.62 MIL/uL — ABNORMAL LOW (ref 4.22–5.81)
RDW: 12.2 % (ref 11.5–15.5)
WBC Count: 5.1 10*3/uL (ref 4.0–10.5)
nRBC: 0 % (ref 0.0–0.2)

## 2023-11-13 MED ORDER — SODIUM CHLORIDE 0.9 % IV SOLN
2400.0000 mg/m2 | INTRAVENOUS | Status: DC
  Administered 2023-11-13: 5000 mg via INTRAVENOUS
  Filled 2023-11-13: qty 100

## 2023-11-13 MED ORDER — DEXAMETHASONE SODIUM PHOSPHATE 10 MG/ML IJ SOLN
10.0000 mg | Freq: Once | INTRAMUSCULAR | Status: AC
  Administered 2023-11-13: 10 mg via INTRAVENOUS
  Filled 2023-11-13: qty 1

## 2023-11-13 MED ORDER — PALONOSETRON HCL INJECTION 0.25 MG/5ML
0.2500 mg | Freq: Once | INTRAVENOUS | Status: AC
  Administered 2023-11-13: 0.25 mg via INTRAVENOUS
  Filled 2023-11-13: qty 5

## 2023-11-13 MED ORDER — DEXTROSE 5 % IV SOLN
400.0000 mg/m2 | Freq: Once | INTRAVENOUS | Status: AC
  Administered 2023-11-13: 796 mg via INTRAVENOUS
  Filled 2023-11-13: qty 39.8

## 2023-11-13 MED ORDER — DEXTROSE 5 % IV SOLN
INTRAVENOUS | Status: DC
  Filled 2023-11-13: qty 250

## 2023-11-13 MED ORDER — OXALIPLATIN CHEMO INJECTION 100 MG/20ML
85.0000 mg/m2 | Freq: Once | INTRAVENOUS | Status: AC
  Administered 2023-11-13: 170 mg via INTRAVENOUS
  Filled 2023-11-13: qty 34

## 2023-11-13 MED ORDER — FLUOROURACIL CHEMO INJECTION 2.5 GM/50ML
400.0000 mg/m2 | Freq: Once | INTRAVENOUS | Status: AC
  Administered 2023-11-13: 800 mg via INTRAVENOUS
  Filled 2023-11-13: qty 16

## 2023-11-13 NOTE — Progress Notes (Signed)
Patient's father just passed away over the weekend so he is really upset. He is just waning to see what his CT results are from 10/09/2023.

## 2023-11-13 NOTE — Assessment & Plan Note (Addendum)
#   OCT 2024-Colon cancer, status post a partial left colectomy 09/04/2023, stage IIIa (pT2 pN1b)-negative resection margins, lymphovascular invasion present, 3/11 lymph nodes, 2 tumor deposits. [Drs.Sherrill; White;Masarouty]. mismatch repair protein expression intact. FOLFOX chemotherapy is given every 2 weeks  # Proceed with FOLFOX #3  of planned 12.  Labs-CBC/chemistries were reviewed with the patient.  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   # lung nodules/ [smoker]- CT chest 03/30/2023-bilateral solid pulmonary nodules-likely benign- NOV 11th CT- LDCT-Lung-RADS 2, benign appearance or behavior. Continue annual screening with low-dose chest CT without contrast in 12 months; Peripheral and basilar predominant subpleural reticular densities, coarsened ground-glass and traction bronchiolectasis, findings indicative of interstitial lung disease such as fibrotic nonspecific interstitial pneumonitis. Defer to PCP/pulmonary referral.   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   # Diabetes- Trulicity/metformin- CGM-monitor closely on chemotherapy/steroids- stable.   # IDA- Hb 11- ferritin- on PO iron; hold off venofer for now. Recheck iron studies; and possible venofer.   # Acitive smoker: Recommend quitting smoking.  Continue lung cancer screening-   #Incidental findings on Imaging  CT , 2024:  Aortic atherosclerosis; . Coronary artery calcification;  Emphysema - I reviewed/ discussed/counseled the patient.   # IV access port   # DISPOSITION:  # FOLFOX-chemo today;  HOLD off venofer- d-3 pump off;   # follow up in 2 weeks-X- MD; port/labs- cbc/cmp; FOLFOX; d-4/jan 2nd pump off;    # follow up in 4  weeks- MD; port/labs-- cbc/cmp; FOLFOX;possible   d-3 pump off;  Dr.B

## 2023-11-13 NOTE — Progress Notes (Signed)
[facility] CONSULT NOTE  Patient Care Team: Ronnald Ramp, MD as PCP - General (Family Medicine) Benita Gutter, RN as Oncology Nurse Navigator Earna Coder, MD as Consulting Physician (Oncology)  CHIEF COMPLAINTS/PURPOSE OF CONSULTATION: Colon cancer  Oncology History Overview Note     Colon cancer, status post a partial left colectomy 09/04/2023, stage IIIa (pT2 pN1b) CT abdomen/pelvis 03/01/2023-prostate/pelvic abscess, large amount of colonic stool CT chest 03/30/2023-bilateral solid pulmonary nodules-likely benign, 34-month follow-up CT recommended Colonoscopy 04/12/2023-multiple polyps removed, greater than 50 mm polyp in the distal ascending colon and sigmoid colon not removed Colonoscopy 06/29/2023 greater than 50 mm polyps removed from the ascending, transverse, and descending colon-tubular adenomas, sigmoid lesion at 35 cm incompletely resected with biopsy revealing an ulcerated tubular adenoma with at least high-grade dysplasia and suspicious for invasive or [place] Descending colon resection 09/04/2023, pT2, PN 1B moderately differentiated adenocarcinoma, negative resection margins, lymphovascular invasion present, 3/11 lymph nodes, 2 tumor deposits, mismatch repair protein expression intact, MSS  Multiple colon polyps on colonoscopy 04/12/2023 and 06/29/2023-tubular adenomas Prostate cancer-Gleason 7 acinar adenocarcinoma involving 5-10% of submitted tissue from a TUR specimen 03/03/2023 Prostate 03/20/2023-mild prostamegaly- PI-RADS category 5 lesion in the left peripheral zone and category 4 lesion in the left anterior peripheral zone and left anterior fibromuscular stroma, reduced size of prostate abscess with continued surrounding prostatitis and periprostatic stranding IMRT 20 6/24 - 07/20/2023  4.  Diabetes 5.  History of colon cancer-negative CancerNext expanded panel 08/29/2023 6.  History of lung nodules, ongoing tobacco use, followed in the lung  cancer screening clinic# prostate/pelvic abscess.  He was admitted for antibiotics and drainage of the pelvic abscess 03/01/2023. A TUR and was found to have a Gleason 7 prostate cancer involving 5-10% of the submitted tissue.  He was referred to Dr. Rushie Chestnut and completed external beam radiation to the prostate.  # MAY 2024 [screening colo]- A greater than 50 mm polyp was removed from the proximal ascending colon.  Additional greater than 50 m polyps were not removed from the distal ascending and sigmoid colon.  Additional smaller polyps were removed from the colon. The pathology revealed multiple fragments of tubular adenomas. Dr. Baird Lyons than 50 mm polyps were removed from the ascending, transverse, and descending colon.  A polypoid lesion at 35 cm was incompletely resected and tattooed.  The pathology from multiple polyps returned as tubular adenomas without high-grade dysplasia or carcinoma.  # OCTOBER 2024- STAGE III SIGMOID COLON CANCER [Dr.Sherril/Dr.White- [place]] pathology from the sigmoid resection returned as invasive moderately differentiated adenocarcinoma involving the descending colon.  Carcinoma invades into but not through the muscularis propria.  The resection margins are negative.  Lymphovascular invasion is present.  Metastatic carcinoma was identified in 3 of 11 lymph nodes and there were 2 tumor deposits.  Mismatch repair protein expression is intact.   # Multiple colon polyps on colonoscopy 04/12/2023 and 06/29/2023-tubular adenomas- s/p  Genetics counseling: negative CancerNext expanded panel 08/29/2023  # NOV 4th, 2024- FOLFOX q 2 w x 6 months.   # APRIL 2024-  Prostate cancer [s/p EBRT- Dr.Chrystal]-    Cancer of descending colon (HCC)  09/19/2023 Initial Diagnosis   Cancer of descending colon (HCC)   09/19/2023 Cancer Staging   Staging form: Colon and Rectum, AJCC 8th Edition - Pathologic: pT2, cM0 - Signed by Ladene Artist, MD on 09/19/2023 Histologic grading  system: 4 grade system Histologic grade (G): G2 Residual tumor (R): R0 - None Laterality: Left Lymph-vascular invasion (LVI): LVI  present/identified, NOS Tumor deposits (TD): Present Perineural invasion (PNI): Absent Microsatellite instability (MSI): Stable   09/19/2023 Cancer Staging   Staging form: Colon and Rectum, AJCC 8th Edition - Pathologic: Stage IIIA (pT2, pN1b, cM0) - Signed by Ladene Artist, MD on 09/19/2023   10/11/2023 -  Chemotherapy   Patient is on Treatment Plan : COLORECTAL FOLFOX q14d x 6 months      HISTORY OF PRESENTING ILLNESS: Patient ambulating-independently.  Alone.  Colin Griffin 72 y.o.  male pleasant patient with  stage III colon cancer s/p left hemicolectomy currently on adjuvant chemotherapy/ and review Lung cancer screening CT scan.   Patient's father just passed away over the weekend so he is really upset.  Since last visit patient's Brother/wife diagnosed with COVID.    Patient did not have any fever or respiratory symptoms. Mild tingling and numbness of the chemotherapy.  Otherwise currently resolved.  Denies any blood in stools or black-colored stools.  No nausea no vomiting.   Review of Systems  Constitutional:  Negative for chills, diaphoresis, fever, malaise/fatigue and weight loss.  HENT:  Negative for nosebleeds and sore throat.   Eyes:  Negative for double vision.  Respiratory:  Negative for cough, hemoptysis, sputum production, shortness of breath and wheezing.   Cardiovascular:  Negative for chest pain, palpitations, orthopnea and leg swelling.  Gastrointestinal:  Negative for abdominal pain, blood in stool, constipation, diarrhea, heartburn, melena, nausea and vomiting.  Genitourinary:  Negative for dysuria, frequency and urgency.  Musculoskeletal:  Negative for back pain and joint pain.  Skin: Negative.  Negative for itching and rash.  Neurological:  Negative for dizziness, tingling, focal weakness, weakness and headaches.   Endo/Heme/Allergies:  Does not bruise/bleed easily.  Psychiatric/Behavioral:  Negative for depression. The patient is not nervous/anxious and does not have insomnia.     MEDICAL HISTORY:  Past Medical History:  Diagnosis Date   Cancer [facility]) April 2024   Constipation 03/04/2023   Diabetes mellitus without complication [facility]) March 2024    SURGICAL HISTORY: Past Surgical History:  Procedure Laterality Date   APPENDECTOMY     BIOPSY  06/29/2023   Procedure: BIOPSY;  Surgeon: Lemar Lofty., MD;  Location: [facility];  Service: Gastroenterology;;   COLONOSCOPY WITH PROPOFOL N/A 04/12/2023   Procedure: COLONOSCOPY WITH PROPOFOL;  Surgeon: [name], MD;  Location: [facility];  Service: Gastroenterology;  Laterality: N/A;   COLONOSCOPY WITH PROPOFOL N/A 06/29/2023   Procedure: COLONOSCOPY WITH PROPOFOL;  Surgeon: [name] Netty Starring., MD;  Location: [facility];  Service: Gastroenterology;  Laterality: N/A;   ENDOSCOPIC MUCOSAL RESECTION N/A 06/29/2023   Procedure: ENDOSCOPIC MUCOSAL RESECTION;  Surgeon: [name] Netty Starring., MD;  Location: [facility];  Service: Gastroenterology;  Laterality: N/A;   FLEXIBLE SIGMOIDOSCOPY N/A 09/04/2023   Procedure: FLEXIBLE SIGMOIDOSCOPY;  Surgeon: Andria Meuse, MD;  Location: [facility];  Service: General;  Laterality: N/A;   HEMOSTASIS CONTROL  06/29/2023   Procedure: HEMOSTASIS CONTROL;  Surgeon: Lemar Lofty., MD;  Location: [facility];  Service: Gastroenterology;;   IR IMAGING GUIDED PORT INSERTION  09/26/2023   IR RADIOLOGIST EVAL & MGMT  03/16/2023   PELVIC ABCESS DRAINAGE     March 2024   POLYPECTOMY  06/29/2023   Procedure: POLYPECTOMY;  Surgeon: [name] Netty Starring., MD;  Location: [facility];  Service: Gastroenterology;;   Sunnie Nielsen LIFTING INJECTION  06/29/2023   Procedure: SUBMUCOSAL LIFTING INJECTION;  Surgeon: Lemar Lofty., MD;  Location: [facility];  Service: Gastroenterology;;  SUBMUCOSAL TATTOO INJECTION  06/29/2023   Procedure: SUBMUCOSAL TATTOO INJECTION;  Surgeon: Lemar Lofty., MD;  Location: [facility];  Service: Gastroenterology;;   TRANSURETHRAL RESECTION OF PROSTATE N/A 03/03/2023   Procedure: TRANSURETHRAL RESECTION OF THE PROSTATE (TURP)/ UNROOFING OF PROSTATE ABSCESS;  Surgeon: Riki Altes, MD;  Location: [facility];  Service: Urology;  Laterality: N/A;    SOCIAL HISTORY: Social History   Socioeconomic History   Marital status: Married    Spouse name: Editor, commissioning   Number of children: 1   Years of education: Not on file   Highest education level: Master's degree (e.g., MA, MS, MEng, MEd, MSW, MBA)  Occupational History   Not on file  Tobacco Use   Smoking status: Every Day    Current packs/day: 1.00    Average packs/day: 1 pack/day for 54.0 years (54.0 ttl pk-yrs)    Types: Cigarettes   Smokeless tobacco: Never   Tobacco comments:    Reports that at one time he smoked 3 packs per day while in the [employer]  Vaping Use   Vaping status: Every Day   Start date: 08/08/2022   Substances: Flavoring  Substance and Sexual Activity   Alcohol use: Not Currently   Drug use: Never   Sexual activity: Not Currently    Birth control/protection: None  Other Topics Concern   Not on file  Social History Narrative   ** Merged History Encounter **       Retired from Holiday representative for 30 years    Social Drivers of Corporate investment banker Strain: Low Risk  (05/01/2023)   Overall Financial Resource Strain (CARDIA)    Difficulty of Paying Living Expenses: Not hard at all  Food Insecurity: No Food Insecurity (09/19/2023)   Hunger Vital Sign    Worried About Running Out of Food in the Last Year: Never true    Ran Out of Food in the Last Year: Never true  Transportation Needs: No Transportation Needs (09/19/2023)   PRAPARE - Administrator, Civil Service (Medical): No    Lack of Transportation (Non-Medical): No   Physical Activity: Sufficiently Active (05/01/2023)   Exercise Vital Sign    Days of Exercise per Week: 6 days    Minutes of Exercise per Session: 150+ min  Stress: No Stress Concern Present (05/01/2023)   [employer] of Occupational Health - Occupational Stress Questionnaire    Feeling of Stress : Not at all  Social Connections: Moderately Integrated (05/01/2023)   Social Connection and Isolation Panel [NHANES]    Frequency of Communication with Friends and Family: Three times a week    Frequency of Social Gatherings with Friends and Family: Twice a week    Attends Religious Services: More than 4 times per year    Active Member of [employer] or Organizations: No    Attends Banker Meetings: Patient declined    Marital Status: Married  Catering manager Violence: Not At Risk (09/19/2023)   Humiliation, Afraid, Rape, and Kick questionnaire    Fear of Current or Ex-Partner: No    Emotionally Abused: No    Physically Abused: No    Sexually Abused: No    FAMILY HISTORY: Family History  Problem Relation Age of Onset   Atrial fibrillation Mother    Skin cancer Father        non-melanoma   Colon cancer Sister 30       Lynch Syndrome   Breast cancer Sister 6   Diabetes  Brother    Throat cancer Maternal Uncle    Breast cancer Paternal Aunt 44 - 36   Colon cancer Maternal Grandmother 27   Alcohol abuse Maternal Grandfather    Breast cancer Paternal Grandmother 87   Prostate cancer Paternal Grandfather 30 - 4   Other Niece        2 nieces have Lynch Syndrome (brother's daughters)   Diabetes Other    Ovarian cancer Other 91       paternal first cousin once removed   Colon polyps Neg Hx    Stomach cancer Neg Hx    Esophageal cancer Neg Hx    Inflammatory bowel disease Neg Hx    Liver disease Neg Hx    Pancreatic cancer Neg Hx     ALLERGIES:  has no known allergies.  MEDICATIONS:  Current Outpatient Medications  Medication Sig Dispense Refill   Continuous Blood  Gluc Sensor (FREESTYLE LIBRE 3 SENSOR) MISC 2 Devices by Does not apply route daily. Place 1 sensor on the skin every 14 days. Use to check glucose continuously 2 each 6   Dulaglutide (TRULICITY) 0.75 MG/0.5ML SOPN Inject 0.75 mg into the skin once a week. 2 mL 3   ferrous sulfate 325 (65 FE) MG tablet Take 1 tablet (325 mg total) by mouth 2 (two) times daily with a meal. 60 tablet 0   lidocaine-prilocaine (EMLA) cream Apply 1 Application topically as needed. 30 g 1   metFORMIN (GLUCOPHAGE-XR) 500 MG 24 hr tablet TAKE 1 TABLET BY MOUTH EVERY DAY WITH BREAKFAST 90 tablet 1   ondansetron (ZOFRAN) 8 MG tablet One pill every 8 hours as needed for nausea/vomitting. 40 tablet 1   prochlorperazine (COMPAZINE) 10 MG tablet Take 1 tablet (10 mg total) by mouth every 6 (six) hours as needed for nausea or vomiting. 40 tablet 1   No current facility-administered medications for this visit.   Facility-Administered Medications Ordered in Other Visits  Medication Dose Route Frequency Provider Last Rate Last Admin   dexamethasone (DECADRON) injection 10 mg  10 mg Intravenous Once Louretta Shorten R, MD       dextrose 5 % solution   Intravenous Continuous Donneta Romberg, Worthy Flank, MD       fluorouracil (ADRUCIL) 5,000 mg in sodium chloride 0.9 % 150 mL chemo infusion  2,400 mg/m2 (Treatment Plan Recorded) Intravenous 1 day or 1 dose Earna Coder, MD       fluorouracil (ADRUCIL) chemo injection 800 mg  400 mg/m2 (Treatment Plan Recorded) Intravenous Once Earna Coder, MD       leucovorin 796 mg in dextrose 5 % 250 mL infusion  400 mg/m2 (Treatment Plan Recorded) Intravenous Once Earna Coder, MD       oxaliplatin (ELOXATIN) 170 mg in dextrose 5 % 500 mL chemo infusion  85 mg/m2 (Treatment Plan Recorded) Intravenous Once Earna Coder, MD       palonosetron (ALOXI) injection 0.25 mg  0.25 mg Intravenous Once Earna Coder, MD        PHYSICAL EXAMINATION:   Vitals:    11/13/23 0831  BP: (!) 144/92  Pulse: 71  Temp: 97.6 F (36.4 C)  SpO2: 100%   Filed Weights   11/13/23 0831  Weight: 169 lb (76.7 kg)    Physical Exam Vitals and nursing note reviewed.  HENT:     Head: Normocephalic and atraumatic.     Mouth/Throat:     Pharynx: Oropharynx is clear.  Eyes:  Extraocular Movements: Extraocular movements intact.     Pupils: Pupils are equal, round, and reactive to light.  Cardiovascular:     Rate and Rhythm: Normal rate and regular rhythm.  Pulmonary:     Comments: Decreased breath sounds bilaterally.  Abdominal:     Palpations: Abdomen is soft.  Musculoskeletal:        General: Normal range of motion.     Cervical back: Normal range of motion.  Skin:    General: Skin is warm.  Neurological:     General: No focal deficit present.     Mental Status: He is alert and oriented to person, place, and time.  Psychiatric:        Behavior: Behavior normal.        Judgment: Judgment normal.     LABORATORY DATA:  I have reviewed the data as listed Lab Results  Component Value Date   WBC 5.1 11/13/2023   HGB 11.5 (L) 11/13/2023   HCT 33.8 (L) 11/13/2023   MCV 93.4 11/13/2023   PLT 136 (L) 11/13/2023   Recent Labs    03/01/23 1954 03/02/23 0504 10/11/23 0750 10/30/23 0758 11/13/23 0813  NA  --    < > 137 138 141  K  --    < > 4.1 4.1 4.2  CL  --    < > 108 107 109  CO2  --    < > 22 23 24   GLUCOSE  --    < > 160* 168* 149*  BUN  --    < > 18 16 13   CREATININE  --    < > 0.91 0.98 1.05  CALCIUM  --    < > 9.2 9.2 9.5  GFRNONAA  --    < > >60 >60 >60  PROT 8.4*   < > 6.8 6.6 6.6  ALBUMIN 3.2*   < > 4.0 3.9 3.7  AST 40   < > 17 23 25   ALT 48*   < > 15 20 27   ALKPHOS 186*   < > 53 50 55  BILITOT 0.8   < > 0.2 0.3 0.3  BILIDIR 0.2  --   --   --   --   IBILI 0.6  --   --   --   --    < > = values in this interval not displayed.    RADIOGRAPHIC STUDIES: I have personally reviewed the radiological images as listed and agreed  with the findings in the report. No results found.   Cancer of descending colon (HCC) # OCT 2024-Colon cancer, status post a partial left colectomy 09/04/2023, stage IIIa (pT2 pN1b)-negative resection margins, lymphovascular invasion present, 3/11 lymph nodes, 2 tumor deposits. [Drs.Sherrill; White;Masarouty]. mismatch repair protein expression intact. FOLFOX chemotherapy is given every 2 weeks  # Proceed with FOLFOX #3  of planned 12.  Labs-CBC/chemistries were reviewed with the patient.  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   # lung nodules/ [smoker]- CT chest 03/30/2023-bilateral solid pulmonary nodules-likely benign- NOV 11th CT- LDCT-Lung-RADS 2, benign appearance or behavior. Continue annual screening with low-dose chest CT without contrast in 12 months; Peripheral and basilar predominant subpleural reticular densities, coarsened ground-glass and traction bronchiolectasis, findings indicative of interstitial lung disease such as fibrotic nonspecific interstitial pneumonitis. Defer to PCP/pulmonary referral.   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   #  Diabetes- Trulicity/metformin- CGM-monitor closely on chemotherapy/steroids- stable.   # IDA- Hb 11- ferritin- on PO iron; hold off venofer for now. Recheck iron studies; and possible venofer.   # Acitive smoker: Recommend quitting smoking.  Continue lung cancer screening-   #Incidental findings on Imaging  CT , 2024:  Aortic atherosclerosis; . Coronary artery calcification;  Emphysema - I reviewed/ discussed/counseled the patient.   # IV access port   # DISPOSITION:  # FOLFOX-chemo today;  HOLD off venofer- d-3 pump off;   # follow up in 2 weeks-X- MD; port/labs- cbc/cmp; FOLFOX; d-4/jan 2nd pump off;    # follow up in 4  weeks- MD; port/labs-- cbc/cmp; FOLFOX;possible   d-3 pump off;   Dr.B   Above plan of care was discussed with patient/family in detail.  My contact information was given to the patient/family.     Earna Coder, MD 11/13/2023 9:36 AM

## 2023-11-15 ENCOUNTER — Inpatient Hospital Stay: Payer: Medicare PPO

## 2023-11-15 VITALS — BP 107/75 | HR 84 | Temp 96.9°F | Resp 18

## 2023-11-15 DIAGNOSIS — Z5111 Encounter for antineoplastic chemotherapy: Secondary | ICD-10-CM | POA: Diagnosis not present

## 2023-11-15 DIAGNOSIS — C186 Malignant neoplasm of descending colon: Secondary | ICD-10-CM

## 2023-11-15 MED ORDER — SODIUM CHLORIDE 0.9% FLUSH
10.0000 mL | INTRAVENOUS | Status: DC | PRN
  Administered 2023-11-15: 10 mL
  Filled 2023-11-15: qty 10

## 2023-11-15 MED ORDER — HEPARIN SOD (PORK) LOCK FLUSH 100 UNIT/ML IV SOLN
500.0000 [IU] | Freq: Once | INTRAVENOUS | Status: AC | PRN
Start: 2023-11-15 — End: 2023-11-15
  Administered 2023-11-15: 500 [IU]
  Filled 2023-11-15: qty 5

## 2023-11-16 ENCOUNTER — Encounter: Payer: Self-pay | Admitting: Radiation Oncology

## 2023-11-16 ENCOUNTER — Ambulatory Visit
Admission: RE | Admit: 2023-11-16 | Discharge: 2023-11-16 | Disposition: A | Discharge status: Home / Self Care | Payer: Medicare PPO | Source: Ambulatory Visit | Attending: Radiation Oncology | Admitting: Radiation Oncology

## 2023-11-16 VITALS — BP 131/85 | HR 89 | Temp 98.3°F | Resp 16 | Wt 167.9 lb

## 2023-11-16 DIAGNOSIS — Z923 Personal history of irradiation: Secondary | ICD-10-CM | POA: Diagnosis not present

## 2023-11-16 DIAGNOSIS — C61 Malignant neoplasm of prostate: Secondary | ICD-10-CM | POA: Diagnosis present

## 2023-11-16 DIAGNOSIS — R197 Diarrhea, unspecified: Secondary | ICD-10-CM | POA: Diagnosis not present

## 2023-11-16 NOTE — Progress Notes (Signed)
Radiation Oncology Follow up Note  Name: Marten Marchak   Date:   11/16/2023 MRN:  [id number] DOB: 1951/08/16    This 72 y.o. male presents to the clinic today for 61-month follow-up status post IMRT radiation therapy to his prostate for Gleason 7 adenocarcinoma the prostate and patient currently under treatment for colon cancer.  REFERRING PROVIDER: Brett Albino*  HPI: Patient is a 72 year old male now out for months having completed IMRT radiation therapy to his prostate for Gleason 7 adenocarcinoma.Marland Kitchen  He is also status post descending colon resection free pathologic T2 N1b moderately differentiated adenocarcinoma with negative resection margins lymph-vascular invasion and 3 of 11 lymph nodes positive mismatch repair protein expression was intact.  He is currently on FOLFOX chemotherapy for least another 4 months.  He is doing well specifically denies any increased lower Neri tract symptoms diarrhea has intermittent diarrhea most likely secondary to his FOLFOX chemotherapy.  His most recent PSA is 0.92-month prior  COMPLICATIONS OF TREATMENT: none  FOLLOW UP COMPLIANCE: keeps appointments   PHYSICAL EXAM:  BP 131/85   Pulse 89   Temp 98.3 F (36.8 C) (Tympanic)   Resp 16   Wt 167 lb 14.4 oz (76.2 kg)   BMI 22.15 kg/m  Well-developed well-nourished patient in NAD. HEENT reveals PERLA, EOMI, discs not visualized.  Oral cavity is clear. No oral mucosal lesions are identified. Neck is clear without evidence of cervical or supraclavicular adenopathy. Lungs are clear to A&P. Cardiac examination is essentially unremarkable with regular rate and rhythm without murmur rub or thrill. Abdomen is benign with no organomegaly or masses noted. Motor sensory and DTR levels are equal and symmetric in the upper and lower extremities. Cranial nerves II through XII are grossly intact. Proprioception is intact. No peripheral adenopathy or edema is identified. No motor or sensory levels are  noted. Crude visual fields are within normal range.  RADIOLOGY RESULTS: No current films for review except a CT scan showing benign appearance of his chest  PLAN: Present time patient is doing well excellent biochemical response to radiation therapy to his prostate.  He continues on FOLFOX chemotherapy.  I have asked to see him back in 6 months for follow-up.  Will do a PSA at that time.  Patient knows to call sooner with any concerns.  I would like to take this opportunity to thank you for allowing me to participate in the care of your patient.Carmina Miller, MD

## 2023-11-17 ENCOUNTER — Other Ambulatory Visit: Payer: Self-pay

## 2023-11-27 ENCOUNTER — Inpatient Hospital Stay: Payer: Medicare PPO

## 2023-11-27 ENCOUNTER — Inpatient Hospital Stay (HOSPITAL_BASED_OUTPATIENT_CLINIC_OR_DEPARTMENT_OTHER): Payer: Medicare PPO | Admitting: Internal Medicine

## 2023-11-27 ENCOUNTER — Encounter: Payer: Self-pay | Admitting: Internal Medicine

## 2023-11-27 VITALS — BP 133/87 | HR 84 | Temp 95.1°F | Resp 18 | Ht 73.0 in | Wt 164.6 lb

## 2023-11-27 DIAGNOSIS — Z5111 Encounter for antineoplastic chemotherapy: Secondary | ICD-10-CM | POA: Diagnosis not present

## 2023-11-27 DIAGNOSIS — C186 Malignant neoplasm of descending colon: Secondary | ICD-10-CM

## 2023-11-27 DIAGNOSIS — C61 Malignant neoplasm of prostate: Secondary | ICD-10-CM

## 2023-11-27 LAB — CMP (CANCER CENTER ONLY)
ALT: 18 U/L (ref 0–44)
AST: 18 U/L (ref 15–41)
Albumin: 3.9 g/dL (ref 3.5–5.0)
Alkaline Phosphatase: 55 U/L (ref 38–126)
Anion gap: 8 (ref 5–15)
BUN: 19 mg/dL (ref 8–23)
CO2: 21 mmol/L — ABNORMAL LOW (ref 22–32)
Calcium: 9.5 mg/dL (ref 8.9–10.3)
Chloride: 106 mmol/L (ref 98–111)
Creatinine: 1.04 mg/dL (ref 0.61–1.24)
GFR, Estimated: >60 mL/min (ref >60)
Glucose, Bld: 159 mg/dL — ABNORMAL HIGH (ref 70–99)
Potassium: 3.9 mmol/L (ref 3.5–5.1)
Sodium: 135 mmol/L (ref 135–145)
Total Bilirubin: 0.8 mg/dL (ref <1.2)
Total Protein: 7.2 g/dL (ref 6.5–8.1)

## 2023-11-27 LAB — CBC WITH DIFFERENTIAL (CANCER CENTER ONLY)
Abs Immature Granulocytes: 0.02 10*3/uL (ref 0.00–0.07)
Basophils Absolute: 0 10*3/uL (ref 0.0–0.1)
Basophils Relative: 1 %
Eosinophils Absolute: 0.1 10*3/uL (ref 0.0–0.5)
Eosinophils Relative: 3 %
HCT: 35.4 % — ABNORMAL LOW (ref 39.0–52.0)
Hemoglobin: 12.4 g/dL — ABNORMAL LOW (ref 13.0–17.0)
Immature Granulocytes: 0 %
Lymphocytes Relative: 35 %
Lymphs Abs: 1.8 10*3/uL (ref 0.7–4.0)
MCH: 32.6 pg (ref 26.0–34.0)
MCHC: 35 g/dL (ref 30.0–36.0)
MCV: 93.2 fL (ref 80.0–100.0)
Monocytes Absolute: 0.8 10*3/uL (ref 0.1–1.0)
Monocytes Relative: 15 %
Neutro Abs: 2.4 10*3/uL (ref 1.7–7.7)
Neutrophils Relative %: 46 %
Platelet Count: 133 10*3/uL — ABNORMAL LOW (ref 150–400)
RBC: 3.8 MIL/uL — ABNORMAL LOW (ref 4.22–5.81)
RDW: 13.2 % (ref 11.5–15.5)
WBC Count: 5 10*3/uL (ref 4.0–10.5)
nRBC: 0 % (ref 0.0–0.2)

## 2023-11-27 MED ORDER — DEXTROSE 5 % IV SOLN
INTRAVENOUS | Status: DC
  Filled 2023-11-27: qty 250

## 2023-11-27 MED ORDER — DEXTROSE 5 % IV SOLN
400.0000 mg/m2 | Freq: Once | INTRAVENOUS | Status: AC
  Administered 2023-11-27: 796 mg via INTRAVENOUS
  Filled 2023-11-27: qty 39.8

## 2023-11-27 MED ORDER — PALONOSETRON HCL INJECTION 0.25 MG/5ML
0.2500 mg | Freq: Once | INTRAVENOUS | Status: AC
  Administered 2023-11-27: 0.25 mg via INTRAVENOUS
  Filled 2023-11-27: qty 5

## 2023-11-27 MED ORDER — OXALIPLATIN CHEMO INJECTION 100 MG/20ML
85.0000 mg/m2 | Freq: Once | INTRAVENOUS | Status: AC
  Administered 2023-11-27: 170 mg via INTRAVENOUS
  Filled 2023-11-27: qty 34

## 2023-11-27 MED ORDER — FLUOROURACIL CHEMO INJECTION 2.5 GM/50ML
400.0000 mg/m2 | Freq: Once | INTRAVENOUS | Status: AC
Start: 2023-11-27 — End: 2023-11-27
  Administered 2023-11-27: 800 mg via INTRAVENOUS
  Filled 2023-11-27: qty 16

## 2023-11-27 MED ORDER — SODIUM CHLORIDE 0.9 % IV SOLN
2400.0000 mg/m2 | INTRAVENOUS | Status: DC
  Administered 2023-11-27: 5000 mg via INTRAVENOUS
  Filled 2023-11-27: qty 100

## 2023-11-27 MED ORDER — DEXAMETHASONE SODIUM PHOSPHATE 10 MG/ML IJ SOLN
10.0000 mg | Freq: Once | INTRAMUSCULAR | Status: AC
  Administered 2023-11-27: 10 mg via INTRAVENOUS
  Filled 2023-11-27: qty 1

## 2023-11-27 NOTE — Progress Notes (Signed)
Patient states he's eating good but is still losing weight. He is feeling really tired and having some muscle weakness. He also reports every time he blows his nose he sees blood, which he states has never happened before.

## 2023-11-27 NOTE — Progress Notes (Signed)
[facility] CONSULT NOTE  Patient Care Team: Ronnald Ramp, MD as PCP - General (Family Medicine) Benita Gutter, RN as Oncology Nurse Navigator Earna Coder, MD as Consulting Physician (Oncology)  CHIEF COMPLAINTS/PURPOSE OF CONSULTATION: Colon cancer  Oncology History Overview Note     Colon cancer, status post a partial left colectomy 09/04/2023, stage IIIa (pT2 pN1b) CT abdomen/pelvis 03/01/2023-prostate/pelvic abscess, large amount of colonic stool CT chest 03/30/2023-bilateral solid pulmonary nodules-likely benign, 58-month follow-up CT recommended Colonoscopy 04/12/2023-multiple polyps removed, greater than 50 mm polyp in the distal ascending colon and sigmoid colon not removed Colonoscopy 06/29/2023 greater than 50 mm polyps removed from the ascending, transverse, and descending colon-tubular adenomas, sigmoid lesion at 35 cm incompletely resected with biopsy revealing an ulcerated tubular adenoma with at least high-grade dysplasia and suspicious for invasive or [place] Descending colon resection 09/04/2023, pT2, PN 1B moderately differentiated adenocarcinoma, negative resection margins, lymphovascular invasion present, 3/11 lymph nodes, 2 tumor deposits, mismatch repair protein expression intact, MSS  Multiple colon polyps on colonoscopy 04/12/2023 and 06/29/2023-tubular adenomas Prostate cancer-Gleason 7 acinar adenocarcinoma involving 5-10% of submitted tissue from a TUR specimen 03/03/2023 Prostate 03/20/2023-mild prostamegaly- PI-RADS category 5 lesion in the left peripheral zone and category 4 lesion in the left anterior peripheral zone and left anterior fibromuscular stroma, reduced size of prostate abscess with continued surrounding prostatitis and periprostatic stranding IMRT 20 6/24 - 07/20/2023  4.  Diabetes 5.  History of colon cancer-negative CancerNext expanded panel 08/29/2023 6.  History of lung nodules, ongoing tobacco use, followed in the lung  cancer screening clinic# prostate/pelvic abscess.  He was admitted for antibiotics and drainage of the pelvic abscess 03/01/2023. A TUR and was found to have a Gleason 7 prostate cancer involving 5-10% of the submitted tissue.  He was referred to Dr. Rushie Chestnut and completed external beam radiation to the prostate.  # MAY 2024 [screening colo]- A greater than 50 mm polyp was removed from the proximal ascending colon.  Additional greater than 50 m polyps were not removed from the distal ascending and sigmoid colon.  Additional smaller polyps were removed from the colon. The pathology revealed multiple fragments of tubular adenomas. Dr. Baird Lyons than 50 mm polyps were removed from the ascending, transverse, and descending colon.  A polypoid lesion at 35 cm was incompletely resected and tattooed.  The pathology from multiple polyps returned as tubular adenomas without high-grade dysplasia or carcinoma.  # OCTOBER 2024- STAGE III SIGMOID COLON CANCER [Dr.Sherril/Dr.White- [place]] pathology from the sigmoid resection returned as invasive moderately differentiated adenocarcinoma involving the descending colon.  Carcinoma invades into but not through the muscularis propria.  The resection margins are negative.  Lymphovascular invasion is present.  Metastatic carcinoma was identified in 3 of 11 lymph nodes and there were 2 tumor deposits.  Mismatch repair protein expression is intact.   # Multiple colon polyps on colonoscopy 04/12/2023 and 06/29/2023-tubular adenomas- s/p  Genetics counseling: negative CancerNext expanded panel 08/29/2023  # NOV 4th, 2024- FOLFOX q 2 w x 6 months.   # APRIL 2024-  Prostate cancer [s/p EBRT- Dr.Chrystal]-    Cancer of descending colon (HCC)  09/19/2023 Initial Diagnosis   Cancer of descending colon (HCC)   09/19/2023 Cancer Staging   Staging form: Colon and Rectum, AJCC 8th Edition - Pathologic: pT2, cM0 - Signed by Ladene Artist, MD on 09/19/2023 Histologic grading  system: 4 grade system Histologic grade (G): G2 Residual tumor (R): R0 - None Laterality: Left Lymph-vascular invasion (LVI): LVI  present/identified, NOS Tumor deposits (TD): Present Perineural invasion (PNI): Absent Microsatellite instability (MSI): Stable   09/19/2023 Cancer Staging   Staging form: Colon and Rectum, AJCC 8th Edition - Pathologic: Stage IIIA (pT2, pN1b, cM0) - Signed by Ladene Artist, MD on 09/19/2023   10/11/2023 -  Chemotherapy   Patient is on Treatment Plan : COLORECTAL FOLFOX q14d x 6 months      HISTORY OF PRESENTING ILLNESS: Patient ambulating-independently.  Alone.  Colin Griffin 72 y.o.  male pleasant patient with  stage III colon cancer s/p left hemicolectomy currently on adjuvant chemotherapy  Patient reports feeling tired.  Does report cold sensitivity in his mouth and his fingers which has been getting worse.  Still manageable.  Denies any neuropathy.  Denies nausea or vomiting.  Denies diarrhea.  He has been losing weight and reports his muscle mass.  Review of Systems  Constitutional:  Negative for chills, diaphoresis, fever, malaise/fatigue and weight loss.  HENT:  Negative for nosebleeds and sore throat.   Eyes:  Negative for double vision.  Respiratory:  Negative for cough, hemoptysis, sputum production, shortness of breath and wheezing.   Cardiovascular:  Negative for chest pain, palpitations, orthopnea and leg swelling.  Gastrointestinal:  Negative for abdominal pain, blood in stool, constipation, diarrhea, heartburn, melena, nausea and vomiting.  Genitourinary:  Negative for dysuria, frequency and urgency.  Musculoskeletal:  Negative for back pain and joint pain.  Skin: Negative.  Negative for itching and rash.  Neurological:  Negative for dizziness, tingling, focal weakness, weakness and headaches.  Endo/Heme/Allergies:  Does not bruise/bleed easily.  Psychiatric/Behavioral:  Negative for depression. The patient is not nervous/anxious  and does not have insomnia.     MEDICAL HISTORY:  Past Medical History:  Diagnosis Date   [facility]cer [facility]) April 2024   Constipation 03/04/2023   Diabetes mellitus without complication [facility]) March 2024    SURGICAL HISTORY: Past Surgical History:  Procedure Laterality Date   APPENDECTOMY     BIOPSY  06/29/2023   Procedure: BIOPSY;  Surgeon: Lemar Lofty., MD;  Location: [facility];  Service: Gastroenterology;;   COLONOSCOPY WITH PROPOFOL N/A 04/12/2023   Procedure: COLONOSCOPY WITH PROPOFOL;  Surgeon: [name], MD;  Location: [facility];  Service: Gastroenterology;  Laterality: N/A;   COLONOSCOPY WITH PROPOFOL N/A 06/29/2023   Procedure: COLONOSCOPY WITH PROPOFOL;  Surgeon: [name] Netty Starring., MD;  Location: [facility];  Service: Gastroenterology;  Laterality: N/A;   ENDOSCOPIC MUCOSAL RESECTION N/A 06/29/2023   Procedure: ENDOSCOPIC MUCOSAL RESECTION;  Surgeon: [name] Netty Starring., MD;  Location: [facility];  Service: Gastroenterology;  Laterality: N/A;   FLEXIBLE SIGMOIDOSCOPY N/A 09/04/2023   Procedure: FLEXIBLE SIGMOIDOSCOPY;  Surgeon: Andria Meuse, MD;  Location: [facility];  Service: General;  Laterality: N/A;   HEMOSTASIS CONTROL  06/29/2023   Procedure: HEMOSTASIS CONTROL;  Surgeon: Lemar Lofty., MD;  Location: [facility];  Service: Gastroenterology;;   IR IMAGING GUIDED PORT INSERTION  09/26/2023   IR RADIOLOGIST EVAL & MGMT  03/16/2023   PELVIC ABCESS DRAINAGE     March 2024   POLYPECTOMY  06/29/2023   Procedure: POLYPECTOMY;  Surgeon: [name] Netty Starring., MD;  Location: [facility];  Service: Gastroenterology;;   Sunnie Nielsen LIFTING INJECTION  06/29/2023   Procedure: SUBMUCOSAL LIFTING INJECTION;  Surgeon: Lemar Lofty., MD;  Location: [facility];  Service: Gastroenterology;;   SUBMUCOSAL TATTOO INJECTION  06/29/2023   Procedure: SUBMUCOSAL TATTOO INJECTION;  Surgeon: Lemar Lofty., MD;  Location: [facility];  Service: Gastroenterology;;   TRANSURETHRAL RESECTION OF PROSTATE N/A 03/03/2023   Procedure: TRANSURETHRAL RESECTION OF THE PROSTATE (TURP)/ UNROOFING OF PROSTATE ABSCESS;  Surgeon: Riki Altes, MD;  Location: [facility] ORS;  Service: Urology;  Laterality: N/A;    SOCIAL HISTORY: Social History   Socioeconomic History   Marital status: Married    Spouse name: Editor, commissioning   Number of children: 1   Years of education: Not on file   Highest education level: Master's degree (e.g., MA, MS, MEng, MEd, MSW, MBA)  Occupational History   Not on file  Tobacco Use   Smoking status: Every Day    Current packs/day: 1.00    Average packs/day: 1 pack/day for 54.0 years (54.0 ttl pk-yrs)    Types: Cigarettes   Smokeless tobacco: Never   Tobacco comments:    Reports that at one time he smoked 3 packs per day while in the [employer]  Vaping Use   Vaping status: Every Day   Start date: 08/08/2022   Substances: Flavoring  Substance and Sexual Activity   Alcohol use: Not Currently   Drug use: Never   Sexual activity: Not Currently    Birth control/protection: None  Other Topics Concern   Not on file  Social History Narrative   ** Merged History Encounter **       Retired from Holiday representative for 30 years    Social Drivers of Corporate investment banker Strain: Low Risk  (05/01/2023)   Overall Financial Resource Strain (CARDIA)    Difficulty of Paying Living Expenses: Not hard at all  Food Insecurity: No Food Insecurity (09/19/2023)   Hunger Vital Sign    Worried About Running Out of Food in the Last Year: Never true    Ran Out of Food in the Last Year: Never true  Transportation Needs: No Transportation Needs (09/19/2023)   PRAPARE - Administrator, Civil Service (Medical): No    Lack of Transportation (Non-Medical): No  Physical Activity: Sufficiently Active (05/01/2023)   Exercise Vital Sign    Days of Exercise per Week: 6 days    Minutes of Exercise per  Session: 150+ min  Stress: No Stress Concern Present (05/01/2023)   [employer] of Occupational Health - Occupational Stress Questionnaire    Feeling of Stress : Not at all  Social Connections: Moderately Integrated (05/01/2023)   Social Connection and Isolation Panel [NHANES]    Frequency of Communication with Friends and Family: Three times a week    Frequency of Social Gatherings with Friends and Family: Twice a week    Attends Religious Services: More than 4 times per year    Active Member of [employer] or Organizations: No    Attends Banker Meetings: Patient declined    Marital Status: Married  Catering manager Violence: Not At Risk (09/19/2023)   Humiliation, Afraid, Rape, and Kick questionnaire    Fear of Current or Ex-Partner: No    Emotionally Abused: No    Physically Abused: No    Sexually Abused: No    FAMILY HISTORY: Family History  Problem Relation Age of Onset   Atrial fibrillation Mother    Skin cancer Father        non-melanoma   Colon cancer Sister 42       Lynch Syndrome   Breast cancer Sister 40   Diabetes Brother    Throat cancer Maternal Uncle    Breast cancer Paternal Aunt 81 - 33   Colon cancer  Maternal Grandmother 59   Alcohol abuse Maternal Grandfather    Breast cancer Paternal Grandmother 102   Prostate cancer Paternal Grandfather 50 - 22   Other Niece        2 nieces have Lynch Syndrome (brother's daughters)   Diabetes Other    Ovarian cancer Other 21       paternal first cousin once removed   Colon polyps Neg Hx    Stomach cancer Neg Hx    Esophageal cancer Neg Hx    Inflammatory bowel disease Neg Hx    Liver disease Neg Hx    Pancreatic cancer Neg Hx     ALLERGIES:  has no known allergies.  MEDICATIONS:  Current Outpatient Medications  Medication Sig Dispense Refill   Continuous Blood Gluc Sensor (FREESTYLE LIBRE 3 SENSOR) MISC 2 Devices by Does not apply route daily. Place 1 sensor on the skin every 14 days. Use to check  glucose continuously 2 each 6   Dulaglutide (TRULICITY) 0.75 MG/0.5ML SOPN Inject 0.75 mg into the skin once a week. 2 mL 3   ferrous sulfate 325 (65 FE) MG tablet Take 1 tablet (325 mg total) by mouth 2 (two) times daily with a meal. 60 tablet 0   lidocaine-prilocaine (EMLA) cream Apply 1 Application topically as needed. 30 g 1   metFORMIN (GLUCOPHAGE-XR) 500 MG 24 hr tablet TAKE 1 TABLET BY MOUTH EVERY DAY WITH BREAKFAST 90 tablet 1   ondansetron (ZOFRAN) 8 MG tablet One pill every 8 hours as needed for nausea/vomitting. 40 tablet 1   prochlorperazine (COMPAZINE) 10 MG tablet Take 1 tablet (10 mg total) by mouth every 6 (six) hours as needed for nausea or vomiting. 40 tablet 1   No current facility-administered medications for this visit.   Facility-Administered Medications Ordered in Other Visits  Medication Dose Route Frequency Provider Last Rate Last Admin   dextrose 5 % solution   Intravenous Continuous Earna Coder, MD 10 mL/hr at 11/27/23 0928 New Bag at 11/27/23 0928   fluorouracil (ADRUCIL) 5,000 mg in sodium chloride 0.9 % 150 mL chemo infusion  2,400 mg/m2 (Treatment Plan Recorded) Intravenous 1 day or 1 dose Earna Coder, MD       fluorouracil (ADRUCIL) chemo injection 800 mg  400 mg/m2 (Treatment Plan Recorded) Intravenous Once Earna Coder, MD       leucovorin 796 mg in dextrose 5 % 250 mL infusion  400 mg/m2 (Treatment Plan Recorded) Intravenous Once Earna Coder, MD 145 mL/hr at 11/27/23 1014 796 mg at 11/27/23 1014   oxaliplatin (ELOXATIN) 170 mg in dextrose 5 % 500 mL chemo infusion  85 mg/m2 (Treatment Plan Recorded) Intravenous Once Earna Coder, MD 267 mL/hr at 11/27/23 1016 170 mg at 11/27/23 1016    PHYSICAL EXAMINATION:   Vitals:   11/27/23 0826  BP: 133/87  Pulse: 84  Resp: 18  Temp: (!) 95.1 F (35.1 C)  SpO2: 100%   Filed Weights   11/27/23 0826  Weight: 164 lb 9.6 oz (74.7 kg)    Physical Exam Vitals and  nursing note reviewed.  HENT:     Head: Normocephalic and atraumatic.     Mouth/Throat:     Pharynx: Oropharynx is clear.  Eyes:     Extraocular Movements: Extraocular movements intact.     Pupils: Pupils are equal, round, and reactive to light.  Cardiovascular:     Rate and Rhythm: Normal rate and regular rhythm.  Pulmonary:     Comments:  Decreased breath sounds bilaterally.  Abdominal:     Palpations: Abdomen is soft.  Musculoskeletal:        General: Normal range of motion.     Cervical back: Normal range of motion.  Skin:    General: Skin is warm.  Neurological:     General: No focal deficit present.     Mental Status: He is alert and oriented to person, place, and time.  Psychiatric:        Behavior: Behavior normal.        Judgment: Judgment normal.     LABORATORY DATA:  I have reviewed the data as listed Lab Results  Component Value Date   WBC 5.0 11/27/2023   HGB 12.4 (L) 11/27/2023   HCT 35.4 (L) 11/27/2023   MCV 93.2 11/27/2023   PLT 133 (L) 11/27/2023   Recent Labs    03/01/23 1954 03/02/23 0504 10/30/23 0758 11/13/23 0813 11/27/23 0815  NA  --    < > 138 141 135  K  --    < > 4.1 4.2 3.9  CL  --    < > 107 109 106  CO2  --    < > 23 24 21*  GLUCOSE  --    < > 168* 149* 159*  BUN  --    < > 16 13 19   CREATININE  --    < > 0.98 1.05 1.04  CALCIUM  --    < > 9.2 9.5 9.5  GFRNONAA  --    < > >60 >60 >60  PROT 8.4*   < > 6.6 6.6 7.2  ALBUMIN 3.2*   < > 3.9 3.7 3.9  AST 40   < > 23 25 18   ALT 48*   < > 20 27 18   ALKPHOS 186*   < > 50 55 55  BILITOT 0.8   < > 0.3 0.3 0.8  BILIDIR 0.2  --   --   --   --   IBILI 0.6  --   --   --   --    < > = values in this interval not displayed.    RADIOGRAPHIC STUDIES: I have personally reviewed the radiological images as listed and agreed with the findings in the report. No results found.  Cancer of descending colon (HCC) # OCT 2024-Colon cancer, status post a partial left colectomy 09/04/2023, stage IIIa  (pT2 pN1b)-negative resection margins, lymphovascular invasion present, 3/11 lymph nodes, 2 tumor deposits. [Drs.Sherrill; White;Masarouty]. mismatch repair protein expression intact. FOLFOX chemotherapy is given every 2 weeks   # Proceed with FOLFOX #4  of planned 12.  Labs-CBC/chemistries were reviewed with the patient.   # PN G-1- sec to oxaliplatin- Reports cold sensitivity in his fingers which is slowly worsening.  Denies any neuropathy.  Will continue with full dose of oxaliplatin 85 mg/m today.  Close monitoring.   # lung nodules/ [smoker]- CT chest 03/30/2023-bilateral solid pulmonary nodules-likely benign- NOV 11th CT- LDCT-Lung-RADS 2, benign appearance or behavior. Continue annual screening with low-dose chest CT without contrast in 12 months; Peripheral and basilar predominant subpleural reticular densities, coarsened ground-glass and traction bronchiolectasis, findings indicative of interstitial lung disease such as fibrotic nonspecific interstitial pneumonitis. Defer to PCP/pulmonary referral.    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    # Diabetes- Trulicity/metformin- CGM-monitor closely on chemotherapy/steroids- stable.    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  Continue lung cancer screening-    #Incidental findings on Imaging  CT , 2024:  Aortic atherosclerosis; . Coronary artery calcification;  Emphysema - I reviewed/ discussed/counseled the patient.    # IV access port    # DISPOSITION:  # RTC in 2 weeks with follow-up with Dr. Leonard Schwartz, labs, cycle 5 FOLFOX     Above plan of care was discussed with patient/family in detail.  My contact information was given to the patient/family.     Michaelyn Barter, MD 11/27/2023 12:02 PM

## 2023-11-28 LAB — PSA: Prostatic Specific Antigen: 0.02 ng/mL (ref 0.00–4.00)

## 2023-11-30 ENCOUNTER — Inpatient Hospital Stay: Payer: Medicare PPO | Attending: Oncology

## 2023-11-30 VITALS — BP 127/88 | HR 80 | Temp 96.0°F | Resp 19

## 2023-11-30 DIAGNOSIS — C186 Malignant neoplasm of descending colon: Secondary | ICD-10-CM | POA: Diagnosis present

## 2023-11-30 DIAGNOSIS — Z7985 Long-term (current) use of injectable non-insulin antidiabetic drugs: Secondary | ICD-10-CM | POA: Insufficient documentation

## 2023-11-30 DIAGNOSIS — F17210 Nicotine dependence, cigarettes, uncomplicated: Secondary | ICD-10-CM | POA: Diagnosis not present

## 2023-11-30 DIAGNOSIS — Z7984 Long term (current) use of oral hypoglycemic drugs: Secondary | ICD-10-CM | POA: Diagnosis not present

## 2023-11-30 DIAGNOSIS — E119 Type 2 diabetes mellitus without complications: Secondary | ICD-10-CM | POA: Insufficient documentation

## 2023-11-30 DIAGNOSIS — Z9079 Acquired absence of other genital organ(s): Secondary | ICD-10-CM | POA: Insufficient documentation

## 2023-11-30 DIAGNOSIS — C61 Malignant neoplasm of prostate: Secondary | ICD-10-CM | POA: Diagnosis present

## 2023-11-30 DIAGNOSIS — Z5111 Encounter for antineoplastic chemotherapy: Secondary | ICD-10-CM | POA: Insufficient documentation

## 2023-11-30 MED ORDER — HEPARIN SOD (PORK) LOCK FLUSH 100 UNIT/ML IV SOLN
500.0000 [IU] | Freq: Once | INTRAVENOUS | Status: AC | PRN
  Administered 2023-11-30: 500 [IU]
  Filled 2023-11-30: qty 5

## 2023-12-09 ENCOUNTER — Other Ambulatory Visit: Payer: Self-pay | Admitting: Family Medicine

## 2023-12-09 DIAGNOSIS — E1165 Type 2 diabetes mellitus with hyperglycemia: Secondary | ICD-10-CM

## 2023-12-11 ENCOUNTER — Inpatient Hospital Stay: Payer: Medicare PPO | Admitting: Internal Medicine

## 2023-12-11 ENCOUNTER — Inpatient Hospital Stay: Payer: Medicare PPO

## 2023-12-11 ENCOUNTER — Encounter: Payer: Self-pay | Admitting: Internal Medicine

## 2023-12-11 VITALS — BP 139/87 | HR 77 | Temp 98.0°F | Ht 73.0 in | Wt 174.0 lb

## 2023-12-11 DIAGNOSIS — Z5111 Encounter for antineoplastic chemotherapy: Secondary | ICD-10-CM | POA: Diagnosis not present

## 2023-12-11 DIAGNOSIS — C186 Malignant neoplasm of descending colon: Secondary | ICD-10-CM

## 2023-12-11 LAB — CBC WITH DIFFERENTIAL (CANCER CENTER ONLY)
Abs Immature Granulocytes: 0.02 10*3/uL (ref 0.00–0.07)
Basophils Absolute: 0 10*3/uL (ref 0.0–0.1)
Basophils Relative: 1 %
Eosinophils Absolute: 0.3 10*3/uL (ref 0.0–0.5)
Eosinophils Relative: 6 %
HCT: 29.9 % — ABNORMAL LOW (ref 39.0–52.0)
Hemoglobin: 10.4 g/dL — ABNORMAL LOW (ref 13.0–17.0)
Immature Granulocytes: 0 %
Lymphocytes Relative: 27 %
Lymphs Abs: 1.4 10*3/uL (ref 0.7–4.0)
MCH: 32.8 pg (ref 26.0–34.0)
MCHC: 34.8 g/dL (ref 30.0–36.0)
MCV: 94.3 fL (ref 80.0–100.0)
Monocytes Absolute: 0.9 10*3/uL (ref 0.1–1.0)
Monocytes Relative: 17 %
Neutro Abs: 2.6 10*3/uL (ref 1.7–7.7)
Neutrophils Relative %: 49 %
Platelet Count: 128 10*3/uL — ABNORMAL LOW (ref 150–400)
RBC: 3.17 MIL/uL — ABNORMAL LOW (ref 4.22–5.81)
RDW: 14.3 % (ref 11.5–15.5)
WBC Count: 5.3 10*3/uL (ref 4.0–10.5)
nRBC: 0 % (ref 0.0–0.2)

## 2023-12-11 LAB — CMP (CANCER CENTER ONLY)
ALT: 18 U/L (ref 0–44)
AST: 21 U/L (ref 15–41)
Albumin: 3.3 g/dL — ABNORMAL LOW (ref 3.5–5.0)
Alkaline Phosphatase: 56 U/L (ref 38–126)
Anion gap: 8 (ref 5–15)
BUN: 15 mg/dL (ref 8–23)
CO2: 20 mmol/L — ABNORMAL LOW (ref 22–32)
Calcium: 8.8 mg/dL — ABNORMAL LOW (ref 8.9–10.3)
Chloride: 108 mmol/L (ref 98–111)
Creatinine: 0.87 mg/dL (ref 0.61–1.24)
GFR, Estimated: >60 mL/min (ref >60)
Glucose, Bld: 193 mg/dL — ABNORMAL HIGH (ref 70–99)
Potassium: 3.7 mmol/L (ref 3.5–5.1)
Sodium: 136 mmol/L (ref 135–145)
Total Bilirubin: 0.4 mg/dL (ref 0.0–1.2)
Total Protein: 6.1 g/dL — ABNORMAL LOW (ref 6.5–8.1)

## 2023-12-11 MED ORDER — HEPARIN SOD (PORK) LOCK FLUSH 100 UNIT/ML IV SOLN
500.0000 [IU] | Freq: Once | INTRAVENOUS | Status: DC | PRN
  Filled 2023-12-11: qty 5

## 2023-12-11 MED ORDER — SODIUM CHLORIDE 0.9% FLUSH
10.0000 mL | Freq: Once | INTRAVENOUS | Status: AC
  Administered 2023-12-11: 10 mL via INTRAVENOUS
  Filled 2023-12-11: qty 10

## 2023-12-11 MED ORDER — DEXAMETHASONE SODIUM PHOSPHATE 10 MG/ML IJ SOLN
10.0000 mg | Freq: Once | INTRAMUSCULAR | Status: AC
  Administered 2023-12-11: 10 mg via INTRAVENOUS
  Filled 2023-12-11: qty 1

## 2023-12-11 MED ORDER — OXALIPLATIN CHEMO INJECTION 100 MG/20ML
85.0000 mg/m2 | Freq: Once | INTRAVENOUS | Status: AC
  Administered 2023-12-11: 170 mg via INTRAVENOUS
  Filled 2023-12-11: qty 34

## 2023-12-11 MED ORDER — LEUCOVORIN CALCIUM INJECTION 350 MG
400.0000 mg/m2 | Freq: Once | INTRAVENOUS | Status: AC
  Administered 2023-12-11: 796 mg via INTRAVENOUS
  Filled 2023-12-11: qty 39.8

## 2023-12-11 MED ORDER — SODIUM CHLORIDE 0.9 % IV SOLN
2400.0000 mg/m2 | INTRAVENOUS | Status: DC
  Administered 2023-12-11: 5000 mg via INTRAVENOUS
  Filled 2023-12-11: qty 100

## 2023-12-11 MED ORDER — FLUOROURACIL CHEMO INJECTION 2.5 GM/50ML
400.0000 mg/m2 | Freq: Once | INTRAVENOUS | Status: AC
  Administered 2023-12-11: 800 mg via INTRAVENOUS
  Filled 2023-12-11: qty 16

## 2023-12-11 MED ORDER — DEXTROSE 5 % IV SOLN
INTRAVENOUS | Status: DC
Start: 2023-12-11 — End: 2023-12-11
  Filled 2023-12-11: qty 250

## 2023-12-11 MED ORDER — PALONOSETRON HCL INJECTION 0.25 MG/5ML
0.2500 mg | Freq: Once | INTRAVENOUS | Status: AC
  Administered 2023-12-11: 0.25 mg via INTRAVENOUS
  Filled 2023-12-11: qty 5

## 2023-12-11 NOTE — Progress Notes (Signed)
 [facility] CONSULT NOTE  Patient Care Team: Sharma Coyer, MD as PCP - General (Family Medicine) Maurie Rayfield BIRCH, RN as Oncology Nurse Navigator Rennie Cindy SAUNDERS, MD as Consulting Physician (Oncology)  CHIEF COMPLAINTS/PURPOSE OF CONSULTATION: Colon cancer  Oncology History Overview Note     Colon cancer, status post a partial left colectomy 09/04/2023, stage IIIa (pT2 pN1b) CT abdomen/pelvis 03/01/2023-prostate/pelvic abscess, large amount of colonic stool CT chest 03/30/2023-bilateral solid pulmonary nodules-likely benign, 5-month follow-up CT recommended Colonoscopy 04/12/2023-multiple polyps removed, greater than 50 mm polyp in the distal ascending colon and sigmoid colon not removed Colonoscopy 06/29/2023 greater than 50 mm polyps removed from the ascending, transverse, and descending colon-tubular adenomas, sigmoid lesion at 35 cm incompletely resected with biopsy revealing an ulcerated tubular adenoma with at least high-grade dysplasia and suspicious for invasive or [place] Descending colon resection 09/04/2023, pT2, PN 1B moderately differentiated adenocarcinoma, negative resection margins, lymphovascular invasion present, 3/11 lymph nodes, 2 tumor deposits, mismatch repair protein expression intact, MSS  Multiple colon polyps on colonoscopy 04/12/2023 and 06/29/2023-tubular adenomas Prostate cancer-Gleason 7 acinar adenocarcinoma involving 5-10% of submitted tissue from a TUR specimen 03/03/2023 Prostate 03/20/2023-mild prostamegaly- PI-RADS category 5 lesion in the left peripheral zone and category 4 lesion in the left anterior peripheral zone and left anterior fibromuscular stroma, reduced size of prostate abscess with continued surrounding prostatitis and periprostatic stranding IMRT 20 6/24 - 07/20/2023  4.  Diabetes 5.  History of colon cancer-negative CancerNext expanded panel 08/29/2023 6.  History of lung nodules, ongoing tobacco use, followed in the lung  cancer screening clinic# prostate/pelvic abscess.  He was admitted for antibiotics and drainage of the pelvic abscess 03/01/2023. A TUR and was found to have a Gleason 7 prostate cancer involving 5-10% of the submitted tissue.  He was referred to Dr. Lenn and completed external beam radiation to the prostate.  # MAY 2024 [screening colo]- A greater than 50 mm polyp was removed from the proximal ascending colon.  Additional greater than 50 m polyps were not removed from the distal ascending and sigmoid colon.  Additional smaller polyps were removed from the colon. The pathology revealed multiple fragments of tubular adenomas. Dr. Marijean than 50 mm polyps were removed from the ascending, transverse, and descending colon.  A polypoid lesion at 35 cm was incompletely resected and tattooed.  The pathology from multiple polyps returned as tubular adenomas without high-grade dysplasia or carcinoma.  # OCTOBER 2024- STAGE III SIGMOID COLON CANCER [Dr.Sherril/Dr.White- [place]] pathology from the sigmoid resection returned as invasive moderately differentiated adenocarcinoma involving the descending colon.  Carcinoma invades into but not through the muscularis propria.  The resection margins are negative.  Lymphovascular invasion is present.  Metastatic carcinoma was identified in 3 of 11 lymph nodes and there were 2 tumor deposits.  Mismatch repair protein expression is intact.   # Multiple colon polyps on colonoscopy 04/12/2023 and 06/29/2023-tubular adenomas- s/p  Genetics counseling: negative CancerNext expanded panel 08/29/2023  # NOV 4th, 2024- FOLFOX q 2 w x 6 months.   # APRIL 2024-  Prostate cancer [s/p EBRT- Dr.Chrystal]-    Cancer of descending colon (HCC)  09/19/2023 Initial Diagnosis   Cancer of descending colon (HCC)   09/19/2023 Cancer Staging   Staging form: Colon and Rectum, AJCC 8th Edition - Pathologic: pT2, cM0 - Signed by Cloretta Arley NOVAK, MD on 09/19/2023 Histologic grading  system: 4 grade system Histologic grade (G): G2 Residual tumor (R): R0 - None Laterality: Left Lymph-vascular invasion (LVI): LVI  present/identified, NOS Tumor deposits (TD): Present Perineural invasion (PNI): Absent Microsatellite instability (MSI): Stable   09/19/2023 Cancer Staging   Staging form: Colon and Rectum, AJCC 8th Edition - Pathologic: Stage IIIA (pT2, pN1b, cM0) - Signed by Cloretta Arley NOVAK, MD on 09/19/2023   10/11/2023 -  Chemotherapy   Patient is on Treatment Plan : COLORECTAL FOLFOX q14d x 6 months      HISTORY OF PRESENTING ILLNESS: Patient ambulating-independently.  Alone.  Colin Griffin 73 y.o.  male pleasant patient with  stage III colon cancer s/p left hemicolectomy currently on adjuvant chemotherapy with FOLFOX is here for a follow up.  Patient complains of nasal congestion, green discharge, x2 weeks; dry cough; no pain or fevers. S/p sudafed- symptoms not resolved.   Mild tingling and numbness of the chemotherapy.  Otherwise currently resolved.  Denies any blood in stools or black-colored stools.  No nausea no vomiting.   Review of Systems  Constitutional:  Negative for chills, diaphoresis, fever, malaise/fatigue and weight loss.  HENT:  Negative for nosebleeds and sore throat.   Eyes:  Negative for double vision.  Respiratory:  Negative for cough, hemoptysis, sputum production, shortness of breath and wheezing.   Cardiovascular:  Negative for chest pain, palpitations, orthopnea and leg swelling.  Gastrointestinal:  Negative for abdominal pain, blood in stool, constipation, diarrhea, heartburn, melena, nausea and vomiting.  Genitourinary:  Negative for dysuria, frequency and urgency.  Musculoskeletal:  Negative for back pain and joint pain.  Skin: Negative.  Negative for itching and rash.  Neurological:  Negative for dizziness, tingling, focal weakness, weakness and headaches.  Endo/Heme/Allergies:  Does not bruise/bleed easily.   Psychiatric/Behavioral:  Negative for depression. The patient is not nervous/anxious and does not have insomnia.     MEDICAL HISTORY:  Past Medical History:  Diagnosis Date   Cancer [facility]) April 2024   Constipation 03/04/2023   Diabetes mellitus without complication [facility]) March 2024    SURGICAL HISTORY: Past Surgical History:  Procedure Laterality Date   APPENDECTOMY     BIOPSY  06/29/2023   Procedure: BIOPSY;  Surgeon: Wilhelmenia Aloha Raddle., MD;  Location: [facility];  Service: Gastroenterology;;   COLONOSCOPY WITH PROPOFOL  N/A 04/12/2023   Procedure: COLONOSCOPY WITH PROPOFOL ;  Surgeon: Unk Corinn Skiff, MD;  Location: [facility];  Service: Gastroenterology;  Laterality: N/A;   COLONOSCOPY WITH PROPOFOL  N/A 06/29/2023   Procedure: COLONOSCOPY WITH PROPOFOL ;  Surgeon: Mansouraty, Aloha Raddle., MD;  Location: [facility];  Service: Gastroenterology;  Laterality: N/A;   ENDOSCOPIC MUCOSAL RESECTION N/A 06/29/2023   Procedure: ENDOSCOPIC MUCOSAL RESECTION;  Surgeon: Wilhelmenia Aloha Raddle., MD;  Location: [facility];  Service: Gastroenterology;  Laterality: N/A;   FLEXIBLE SIGMOIDOSCOPY N/A 09/04/2023   Procedure: FLEXIBLE SIGMOIDOSCOPY;  Surgeon: Teresa Lonni HERO, MD;  Location: [facility];  Service: General;  Laterality: N/A;   HEMOSTASIS CONTROL  06/29/2023   Procedure: HEMOSTASIS CONTROL;  Surgeon: Wilhelmenia Aloha Raddle., MD;  Location: [facility];  Service: Gastroenterology;;   IR IMAGING GUIDED PORT INSERTION  09/26/2023   IR RADIOLOGIST EVAL & MGMT  03/16/2023   PELVIC ABCESS DRAINAGE     March 2024   POLYPECTOMY  06/29/2023   Procedure: POLYPECTOMY;  Surgeon: Wilhelmenia Aloha Raddle., MD;  Location: [facility];  Service: Gastroenterology;;   ROBLEY LIFTING INJECTION  06/29/2023   Procedure: SUBMUCOSAL LIFTING INJECTION;  Surgeon: Wilhelmenia Aloha Raddle., MD;  Location: [facility];  Service: Gastroenterology;;   SUBMUCOSAL TATTOO INJECTION  06/29/2023   Procedure:  SUBMUCOSAL TATTOO INJECTION;  Surgeon: Wilhelmenia Aloha Raddle., MD;  Location: [facility];  Service: Gastroenterology;;   TRANSURETHRAL RESECTION OF PROSTATE N/A 03/03/2023   Procedure: TRANSURETHRAL RESECTION OF THE PROSTATE (TURP)/ UNROOFING OF PROSTATE ABSCESS;  Surgeon: Twylla Glendia BROCKS, MD;  Location: [facility];  Service: Urology;  Laterality: N/A;    SOCIAL HISTORY: Social History   Socioeconomic History   Marital status: Married    Spouse name: Editor, Commissioning   Number of children: 1   Years of education: Not on file   Highest education level: Master's degree (e.g., MA, MS, MEng, MEd, MSW, MBA)  Occupational History   Not on file  Tobacco Use   Smoking status: Every Day    Current packs/day: 1.00    Average packs/day: 1 pack/day for 54.0 years (54.0 ttl pk-yrs)    Types: Cigarettes   Smokeless tobacco: Never   Tobacco comments:    Reports that at one time he smoked 3 packs per day while in the [employer]  Vaping Use   Vaping status: Every Day   Start date: 08/08/2022   Substances: Flavoring  Substance and Sexual Activity   Alcohol use: Not Currently   Drug use: Never   Sexual activity: Not Currently    Birth control/protection: None  Other Topics Concern   Not on file  Social History Narrative   ** Merged History Encounter **       Retired from holiday representative for 30 years    Social Drivers of Corporate Investment Banker Strain: Low Risk  (05/01/2023)   Overall Financial Resource Strain (CARDIA)    Difficulty of Paying Living Expenses: Not hard at all  Food Insecurity: No Food Insecurity (09/19/2023)   Hunger Vital Sign    Worried About Running Out of Food in the Last Year: Never true    Ran Out of Food in the Last Year: Never true  Transportation Needs: No Transportation Needs (09/19/2023)   PRAPARE - Administrator, Civil Service (Medical): No    Lack of Transportation (Non-Medical): No  Physical Activity: Sufficiently Active (05/01/2023)    Exercise Vital Sign    Days of Exercise per Week: 6 days    Minutes of Exercise per Session: 150+ min  Stress: No Stress Concern Present (05/01/2023)   [employer] of Occupational Health - Occupational Stress Questionnaire    Feeling of Stress : Not at all  Social Connections: Moderately Integrated (05/01/2023)   Social Connection and Isolation Panel [NHANES]    Frequency of Communication with Friends and Family: Three times a week    Frequency of Social Gatherings with Friends and Family: Twice a week    Attends Religious Services: More than 4 times per year    Active Member of [employer] or Organizations: No    Attends Banker Meetings: Patient declined    Marital Status: Married  Catering Manager Violence: Not At Risk (09/19/2023)   Humiliation, Afraid, Rape, and Kick questionnaire    Fear of Current or Ex-Partner: No    Emotionally Abused: No    Physically Abused: No    Sexually Abused: No    FAMILY HISTORY: Family History  Problem Relation Age of Onset   Atrial fibrillation Mother    Skin cancer Father        non-melanoma   Colon cancer Sister 32       Lynch Syndrome   Breast cancer Sister 46   Diabetes Brother    Throat cancer Maternal Uncle    Breast  cancer Paternal Aunt 61 - 36   Colon cancer Maternal Grandmother 19   Alcohol abuse Maternal Grandfather    Breast cancer Paternal Grandmother 87   Prostate cancer Paternal Grandfather 48 - 81   Other Niece        2 nieces have Lynch Syndrome (brother's daughters)   Diabetes Other    Ovarian cancer Other 41       paternal first cousin once removed   Colon polyps Neg Hx    Stomach cancer Neg Hx    Esophageal cancer Neg Hx    Inflammatory bowel disease Neg Hx    Liver disease Neg Hx    Pancreatic cancer Neg Hx     ALLERGIES:  has no allergies on file.  MEDICATIONS:  Current Outpatient Medications  Medication Sig Dispense Refill   Continuous Blood Gluc Sensor (FREESTYLE LIBRE 3 SENSOR) MISC 2 Devices  by Does not apply route daily. Place 1 sensor on the skin every 14 days. Use to check glucose continuously 2 each 6   Dulaglutide  (TRULICITY ) 0.75 MG/0.5ML SOAJ INJECT 0.75 MG SUBCUTANEOUSLY ONE TIME PER WEEK 6 mL 3   lidocaine -prilocaine  (EMLA ) cream Apply 1 Application topically as needed. 30 g 1   metFORMIN  (GLUCOPHAGE -XR) 500 MG 24 hr tablet TAKE 1 TABLET BY MOUTH EVERY DAY WITH BREAKFAST 90 tablet 1   ondansetron  (ZOFRAN ) 8 MG tablet One pill every 8 hours as needed for nausea/vomitting. 40 tablet 1   prochlorperazine  (COMPAZINE ) 10 MG tablet Take 1 tablet (10 mg total) by mouth every 6 (six) hours as needed for nausea or vomiting. 40 tablet 1   ferrous sulfate  325 (65 FE) MG tablet Take 1 tablet (325 mg total) by mouth 2 (two) times daily with a meal. 60 tablet 0   No current facility-administered medications for this visit.    PHYSICAL EXAMINATION:   Vitals:   12/11/23 0831  BP: 139/87  Pulse: 77  Temp: 98 F (36.7 C)  SpO2: 100%   Filed Weights   12/11/23 0831  Weight: 174 lb (78.9 kg)    Physical Exam Vitals and nursing note reviewed.  HENT:     Head: Normocephalic and atraumatic.     Mouth/Throat:     Pharynx: Oropharynx is clear.  Eyes:     Extraocular Movements: Extraocular movements intact.     Pupils: Pupils are equal, round, and reactive to light.  Cardiovascular:     Rate and Rhythm: Normal rate and regular rhythm.  Pulmonary:     Comments: Decreased breath sounds bilaterally.  Abdominal:     Palpations: Abdomen is soft.  Musculoskeletal:        General: Normal range of motion.     Cervical back: Normal range of motion.  Skin:    General: Skin is warm.  Neurological:     General: No focal deficit present.     Mental Status: He is alert and oriented to person, place, and time.  Psychiatric:        Behavior: Behavior normal.        Judgment: Judgment normal.     LABORATORY DATA:  I have reviewed the data as listed Lab Results  Component Value  Date   WBC 5.3 12/11/2023   HGB 10.4 (L) 12/11/2023   HCT 29.9 (L) 12/11/2023   MCV 94.3 12/11/2023   PLT 128 (L) 12/11/2023   Recent Labs    03/01/23 1954 03/02/23 0504 11/13/23 0813 11/27/23 0815 12/11/23 0823  NA  --    < >  141 135 136  K  --    < > 4.2 3.9 3.7  CL  --    < > 109 106 108  CO2  --    < > 24 21* 20*  GLUCOSE  --    < > 149* 159* 193*  BUN  --    < > 13 19 15   CREATININE  --    < > 1.05 1.04 0.87  CALCIUM   --    < > 9.5 9.5 8.8*  GFRNONAA  --    < > >60 >60 >60  PROT 8.4*   < > 6.6 7.2 6.1*  ALBUMIN 3.2*   < > 3.7 3.9 3.3*  AST 40   < > 25 18 21   ALT 48*   < > 27 18 18   ALKPHOS 186*   < > 55 55 56  BILITOT 0.8   < > 0.3 0.8 0.4  BILIDIR 0.2  --   --   --   --   IBILI 0.6  --   --   --   --    < > = values in this interval not displayed.    RADIOGRAPHIC STUDIES: I have personally reviewed the radiological images as listed and agreed with the findings in the report. No results found.   Cancer of descending colon (HCC) # OCT 2024-Colon cancer, status post a partial left colectomy 09/04/2023, stage IIIa (pT2 pN1b)-negative resection margins, lymphovascular invasion present, 3/11 lymph nodes, 2 tumor deposits. [Drs.Sherrill; White;Masarouty]. mismatch repair protein expression intact. FOLFOX chemotherapy is given every 2 weeks  # Proceed with FOLFOX # 5 of planned 12.  Labs-CBC/chemistries were reviewed with the patient.  # Thrombocytopenia- > 100- sec to chemo- monitor ro for now. Stable.  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 Stable.   # URI- no s/o of infection- recommend clairtin-D OTC. HOLD off anti-biotics at this time; ok to proceed with chemo.   # lung nodules/ [smoker]- CNOV 11th CT- LDCT-Lung-RADS 2, benign appearance or behavior. Continue annual screening with low-dose chest CT without contrast in 12 months; Peripheral and basilar predominant subpleural reticular densities, coarsened ground-glass and traction bronchiolectasis, findings  indicative of interstitial lung disease such as fibrotic nonspecific interstitial pneumonitis. Defer to PCP/pulmonary referral.   # 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 involving 5-10% of submitted tissue from a TUR specimen 03/03/2023; s/p EBRT- IMRT 20 6/24 - 07/20/2023- JAN 2024- PSA- WNL;  stable.   # Diabetes- Trulicity /metformin - CGM-monitor closely on chemotherapy/steroids- stable  # IDA- Hb 11- ferritin- on PO iron; hold off venofer for now. Recheck iron studies; and possible venofer. stable  # Acitive smoker: Recommend quitting smoking.  Continue lung cancer screening-   # IV access port    # DISPOSITION:  # FOLFOX-chemo today; - d-3 pump off;   # follow up in 2 weeks- MD; port/labs- cbc/cmp; FOLFOX; d-3 pump off;    # follow up in 4  weeks- MD; port/labs-- cbc/cmp; FOLFOX;possible   d-3 pump off;  Dr.B   Above plan of care was discussed with patient/family in detail.  My contact information was given to the patient/family.     Cindy JONELLE Joe, MD 12/11/2023 9:10 AM

## 2023-12-11 NOTE — Patient Instructions (Signed)
#   recommend Claritin-D OTC x 5-7 days.

## 2023-12-11 NOTE — Progress Notes (Signed)
 C/o possible sinus infection, congestion, green discharge, x2 weeks.

## 2023-12-11 NOTE — Patient Instructions (Signed)

## 2023-12-11 NOTE — Assessment & Plan Note (Addendum)
#   OCT 2024-Colon cancer, status post a partial left colectomy 09/04/2023, stage IIIa (pT2 pN1b)-negative resection margins, lymphovascular invasion present, 3/11 lymph nodes, 2 tumor deposits. [Drs.Sherrill; White;Masarouty]. mismatch repair protein expression intact. FOLFOX chemotherapy is given every 2 weeks  # Proceed with FOLFOX # 5 of planned 12.  Labs-CBC/chemistries were reviewed with the patient.  # Thrombocytopenia- > 100- sec to chemo- monitor ro for now. Stable.  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 Stable.   # URI- no s/o of infection- recommend clairtin-D OTC. HOLD off anti-biotics at this time; ok to proceed with chemo.   # lung nodules/ [smoker]- CNOV 11th CT- LDCT-Lung-RADS 2, benign appearance or behavior. Continue annual screening with low-dose chest CT without contrast in 12 months; Peripheral and basilar predominant subpleural reticular densities, coarsened ground-glass and traction bronchiolectasis, findings indicative of interstitial lung disease such as fibrotic nonspecific interstitial pneumonitis. Defer to PCP/pulmonary referral.   # 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 involving 5-10% of submitted tissue from a TUR specimen 03/03/2023; s/p EBRT- IMRT 20 6/24 - 07/20/2023- JAN 2024- PSA- WNL;  stable.   # Diabetes- Trulicity /metformin - CGM-monitor closely on chemotherapy/steroids- stable  # IDA- Hb 11- ferritin- on PO iron; hold off venofer for now. Recheck iron studies; and possible venofer. stable  # Acitive smoker: Recommend quitting smoking.  Continue lung cancer screening-   # IV access port    # DISPOSITION:  # FOLFOX-chemo today; - d-3 pump off;   # follow up in 2 weeks- MD; port/labs- cbc/cmp; FOLFOX; d-3 pump off;    # follow up in 4  weeks- MD; port/labs-- cbc/cmp; FOLFOX;possible   d-3 pump off;  Dr.B

## 2023-12-13 ENCOUNTER — Inpatient Hospital Stay: Payer: Medicare PPO

## 2023-12-13 VITALS — BP 131/86 | HR 88 | Resp 18

## 2023-12-13 DIAGNOSIS — C186 Malignant neoplasm of descending colon: Secondary | ICD-10-CM

## 2023-12-13 DIAGNOSIS — Z5111 Encounter for antineoplastic chemotherapy: Secondary | ICD-10-CM | POA: Diagnosis not present

## 2023-12-13 MED ORDER — HEPARIN SOD (PORK) LOCK FLUSH 100 UNIT/ML IV SOLN
500.0000 [IU] | Freq: Once | INTRAVENOUS | Status: AC | PRN
  Administered 2023-12-13: 500 [IU]
  Filled 2023-12-13: qty 5

## 2023-12-13 MED ORDER — SODIUM CHLORIDE 0.9% FLUSH
10.0000 mL | INTRAVENOUS | Status: DC | PRN
  Administered 2023-12-13: 10 mL
  Filled 2023-12-13: qty 10

## 2023-12-18 ENCOUNTER — Encounter: Payer: Self-pay | Admitting: Internal Medicine

## 2023-12-20 ENCOUNTER — Other Ambulatory Visit: Payer: Self-pay | Admitting: Family Medicine

## 2023-12-20 ENCOUNTER — Inpatient Hospital Stay (HOSPITAL_BASED_OUTPATIENT_CLINIC_OR_DEPARTMENT_OTHER): Payer: Medicare PPO | Admitting: Hospice and Palliative Medicine

## 2023-12-20 ENCOUNTER — Encounter: Payer: Self-pay | Admitting: Hospice and Palliative Medicine

## 2023-12-20 VITALS — BP 142/87 | HR 93 | Temp 98.6°F | Resp 18

## 2023-12-20 DIAGNOSIS — J329 Chronic sinusitis, unspecified: Secondary | ICD-10-CM | POA: Diagnosis not present

## 2023-12-20 DIAGNOSIS — E1165 Type 2 diabetes mellitus with hyperglycemia: Secondary | ICD-10-CM

## 2023-12-20 DIAGNOSIS — Z5111 Encounter for antineoplastic chemotherapy: Secondary | ICD-10-CM | POA: Diagnosis not present

## 2023-12-20 DIAGNOSIS — C186 Malignant neoplasm of descending colon: Secondary | ICD-10-CM

## 2023-12-20 MED ORDER — AMOXICILLIN-POT CLAVULANATE 875-125 MG PO TABS
1.0000 | ORAL_TABLET | Freq: Two times a day (BID) | ORAL | 0 refills | Status: DC
Start: 2023-12-20 — End: 2024-01-22

## 2023-12-20 MED ORDER — BENZONATATE 100 MG PO CAPS: 100.0000 mg | ORAL_CAPSULE | Freq: Three times a day (TID) | ORAL | 0 refills | Status: DC | PRN

## 2023-12-20 NOTE — Progress Notes (Signed)
Patient walked into clinic this morning , stating that Dr B said to take Clartion D for sinus and he has taken it for a week but mucus is green and it's going to chest now . Started with nasal congestion. Denies headache. Some dyspnea with exertion.

## 2023-12-20 NOTE — Telephone Encounter (Signed)
Requested Prescriptions  Pending Prescriptions Disp Refills   Continuous Glucose Sensor (FREESTYLE LIBRE 3 SENSOR) MISC [Pharmacy Med Name: FREESTYLE LIBRE 3 SENSOR] 2 each 6    Sig: PLACE 1 SENSOR ON THE SKIN EVERY 14 DAYS. USE TO CHECK GLUCOSE CONTINUOUSLY     Endocrinology: Diabetes - Testing Supplies Passed - 12/20/2023  4:38 PM      Passed - Valid encounter within last 12 months    Recent Outpatient Visits           3 months ago Type 2 diabetes mellitus with hyperglycemia, without long-term current use of insulin (HCC)   [facility] Simmons-Robinson, [place], MD   7 months ago Type 2 diabetes mellitus with hyperglycemia, without long-term current use of insulin (HCC)   [facility] Simmons-Robinson, [place], MD   9 months ago Pelvic abscess in male [facility])   [facility] Simmons-Robinson, [place], MD   10 months ago Type 2 diabetes mellitus with hyperglycemia, without long-term current use of insulin (HCC)   [place] [facility] Simmons-Robinson, [place], MD   10 months ago Dysuria   [place] Simmons-Robinson, Tawanna Cooler, MD       Future Appointments             In 2 months Simmons-Robinson, Tawanna Cooler, MD [facility]

## 2023-12-20 NOTE — Progress Notes (Signed)
Symptom Management Clinic [facility] at [facility] [phone] [phone]  Patient Care Team: Ronnald Ramp, MD as PCP - General (Family Medicine) Benita Gutter, RN as Oncology Nurse Navigator Earna Coder, MD as Consulting Physician (Oncology)   NAME OF PATIENT: Colin Griffin  [id number]  1951-01-03   DATE OF VISIT: 12/20/23  REASON FOR CONSULT: Colin Griffin is a 73 y.o. male with multiple medical problems including stage III colon cancer status post left hemicolectomy currently on adjuvant chemotherapy with FOLFOX.   INTERVAL HISTORY: Patient saw Dr. Donneta Romberg 12/11/2023 at which time patient complained of sinus drainage/pressure.  With recommended to hold off on antibiotics at that time and trial antihistamine.  Patient received cycle 5 FOLFOX.  Patient walked into clinic today requesting to be seen for persistent sinus drainage/pressure.  Patient says that symptoms have persisted for 3 to 4 weeks at this point.  He denies fever or chills but states that he has green sinus drainage draining down into his chest causing persistent coughing.  Denies shortness of breath or chest pain.  Denies wheezing.  Denies other complaints or concerns.  Patient states that he has history of sinus infections and generally gets 1/year.  Denies any neurologic complaints. Denies any easy bleeding or bruising. Reports good appetite and denies weight loss. Denies chest pain. Denies any nausea, vomiting, constipation, or diarrhea. Denies urinary complaints. Patient offers no further specific complaints today.   PAST MEDICAL HISTORY: Past Medical History:  Diagnosis Date   Cancer [facility]) April 2024   Constipation 03/04/2023   Diabetes mellitus without complication [facility]) March 2024    PAST SURGICAL HISTORY:  Past Surgical History:  Procedure Laterality Date   APPENDECTOMY     BIOPSY  06/29/2023   Procedure: BIOPSY;  Surgeon:  Lemar Lofty., MD;  Location: [facility];  Service: Gastroenterology;;   COLONOSCOPY WITH PROPOFOL N/A 04/12/2023   Procedure: COLONOSCOPY WITH PROPOFOL;  Surgeon: [name], MD;  Location: [facility];  Service: Gastroenterology;  Laterality: N/A;   COLONOSCOPY WITH PROPOFOL N/A 06/29/2023   Procedure: COLONOSCOPY WITH PROPOFOL;  Surgeon: [name] Netty Starring., MD;  Location: [facility];  Service: Gastroenterology;  Laterality: N/A;   ENDOSCOPIC MUCOSAL RESECTION N/A 06/29/2023   Procedure: ENDOSCOPIC MUCOSAL RESECTION;  Surgeon: [name] Netty Starring., MD;  Location: [facility];  Service: Gastroenterology;  Laterality: N/A;   FLEXIBLE SIGMOIDOSCOPY N/A 09/04/2023   Procedure: FLEXIBLE SIGMOIDOSCOPY;  Surgeon: Andria Meuse, MD;  Location: [facility];  Service: General;  Laterality: N/A;   HEMOSTASIS CONTROL  06/29/2023   Procedure: HEMOSTASIS CONTROL;  Surgeon: Lemar Lofty., MD;  Location: [facility];  Service: Gastroenterology;;   IR IMAGING GUIDED PORT INSERTION  09/26/2023   IR RADIOLOGIST EVAL & MGMT  03/16/2023   PELVIC ABCESS DRAINAGE     March 2024   POLYPECTOMY  06/29/2023   Procedure: POLYPECTOMY;  Surgeon: Mansouraty, Netty Starring., MD;  Location: [facility];  Service: Gastroenterology;;   Sunnie Nielsen LIFTING INJECTION  06/29/2023   Procedure: SUBMUCOSAL LIFTING INJECTION;  Surgeon: Lemar Lofty., MD;  Location: [facility];  Service: Gastroenterology;;   SUBMUCOSAL TATTOO INJECTION  06/29/2023   Procedure: SUBMUCOSAL TATTOO INJECTION;  Surgeon: Lemar Lofty., MD;  Location: [facility];  Service: Gastroenterology;;   TRANSURETHRAL RESECTION OF PROSTATE N/A 03/03/2023   Procedure: TRANSURETHRAL RESECTION OF THE PROSTATE (TURP)/ UNROOFING OF PROSTATE ABSCESS;  Surgeon: Riki Altes, MD;  Location: [facility] ORS;  Service: Urology;  Laterality: N/A;  HEMATOLOGY/ONCOLOGY HISTORY:  Oncology History Overview Note     Colon  cancer, status post a partial left colectomy 09/04/2023, stage IIIa (pT2 pN1b) CT abdomen/pelvis 03/01/2023-prostate/pelvic abscess, large amount of colonic stool CT chest 03/30/2023-bilateral solid pulmonary nodules-likely benign, 74-month follow-up CT recommended Colonoscopy 04/12/2023-multiple polyps removed, greater than 50 mm polyp in the distal ascending colon and sigmoid colon not removed Colonoscopy 06/29/2023 greater than 50 mm polyps removed from the ascending, transverse, and descending colon-tubular adenomas, sigmoid lesion at 35 cm incompletely resected with biopsy revealing an ulcerated tubular adenoma with at least high-grade dysplasia and suspicious for invasive or [place] Descending colon resection 09/04/2023, pT2, PN 1B moderately differentiated adenocarcinoma, negative resection margins, lymphovascular invasion present, 3/11 lymph nodes, 2 tumor deposits, mismatch repair protein expression intact, MSS  Multiple colon polyps on colonoscopy 04/12/2023 and 06/29/2023-tubular adenomas Prostate cancer-Gleason 7 acinar adenocarcinoma involving 5-10% of submitted tissue from a TUR specimen 03/03/2023 Prostate 03/20/2023-mild prostamegaly- PI-RADS category 5 lesion in the left peripheral zone and category 4 lesion in the left anterior peripheral zone and left anterior fibromuscular stroma, reduced size of prostate abscess with continued surrounding prostatitis and periprostatic stranding IMRT 20 6/24 - 07/20/2023  4.  Diabetes 5.  History of colon cancer-negative CancerNext expanded panel 08/29/2023 6.  History of lung nodules, ongoing tobacco use, followed in the lung cancer screening clinic# prostate/pelvic abscess.  He was admitted for antibiotics and drainage of the pelvic abscess 03/01/2023. A TUR and was found to have a Gleason 7 prostate cancer involving 5-10% of the submitted tissue.  He was referred to Dr. Rushie Chestnut and completed external beam radiation to the prostate.  # MAY 2024 [screening colo]- A  greater than 50 mm polyp was removed from the proximal ascending colon.  Additional greater than 50 m polyps were not removed from the distal ascending and sigmoid colon.  Additional smaller polyps were removed from the colon. The pathology revealed multiple fragments of tubular adenomas. Dr. Baird Lyons than 50 mm polyps were removed from the ascending, transverse, and descending colon.  A polypoid lesion at 35 cm was incompletely resected and tattooed.  The pathology from multiple polyps returned as tubular adenomas without high-grade dysplasia or carcinoma.  # OCTOBER 2024- STAGE III SIGMOID COLON CANCER [Dr.Sherril/Dr.White- [place]] pathology from the sigmoid resection returned as invasive moderately differentiated adenocarcinoma involving the descending colon.  Carcinoma invades into but not through the muscularis propria.  The resection margins are negative.  Lymphovascular invasion is present.  Metastatic carcinoma was identified in 3 of 11 lymph nodes and there were 2 tumor deposits.  Mismatch repair protein expression is intact.   # Multiple colon polyps on colonoscopy 04/12/2023 and 06/29/2023-tubular adenomas- s/p  Genetics counseling: negative CancerNext expanded panel 08/29/2023  # NOV 4th, 2024- FOLFOX q 2 w x 6 months.   # APRIL 2024-  Prostate cancer [s/p EBRT- Dr.Chrystal]-    Cancer of descending colon (HCC)  09/19/2023 Initial Diagnosis   Cancer of descending colon (HCC)   09/19/2023 Cancer Staging   Staging form: Colon and Rectum, AJCC 8th Edition - Pathologic: pT2, cM0 - Signed by Ladene Artist, MD on 09/19/2023 Histologic grading system: 4 grade system Histologic grade (G): G2 Residual tumor (R): R0 - None Laterality: Left Lymph-vascular invasion (LVI): LVI present/identified, NOS Tumor deposits (TD): Present Perineural invasion (PNI): Absent Microsatellite instability (MSI): Stable   09/19/2023 Cancer Staging   Staging form: Colon and Rectum, AJCC 8th Edition -  Pathologic: Stage IIIA (pT2, pN1b, cM0) - Signed by  Ladene Artist, MD on 09/19/2023   10/11/2023 -  Chemotherapy   Patient is on Treatment Plan : COLORECTAL FOLFOX q14d x 6 months       ALLERGIES:  has no allergies on file.  MEDICATIONS:  Current Outpatient Medications  Medication Sig Dispense Refill   Continuous Blood Gluc Sensor (FREESTYLE LIBRE 3 SENSOR) MISC 2 Devices by Does not apply route daily. Place 1 sensor on the skin every 14 days. Use to check glucose continuously 2 each 6   Dulaglutide (TRULICITY) 0.75 MG/0.5ML SOAJ INJECT 0.75 MG SUBCUTANEOUSLY ONE TIME PER WEEK 6 mL 3   ferrous sulfate 325 (65 FE) MG tablet Take 1 tablet (325 mg total) by mouth 2 (two) times daily with a meal. 60 tablet 0   lidocaine-prilocaine (EMLA) cream Apply 1 Application topically as needed. 30 g 1   metFORMIN (GLUCOPHAGE-XR) 500 MG 24 hr tablet TAKE 1 TABLET BY MOUTH EVERY DAY WITH BREAKFAST 90 tablet 1   ondansetron (ZOFRAN) 8 MG tablet One pill every 8 hours as needed for nausea/vomitting. (Patient not taking: Reported on 12/20/2023) 40 tablet 1   prochlorperazine (COMPAZINE) 10 MG tablet Take 1 tablet (10 mg total) by mouth every 6 (six) hours as needed for nausea or vomiting. (Patient not taking: Reported on 12/20/2023) 40 tablet 1   No current facility-administered medications for this visit.    VITAL SIGNS: BP (!) 142/87 (BP Location: Left Arm)   Pulse 93   Temp 98.6 F (37 C) (Tympanic)   Resp 18   SpO2 100%  There were no vitals filed for this visit.  Estimated body mass index is 22.96 kg/m as calculated from the following:   Height as of 12/11/23: 6\' 1"  (1.854 m).   Weight as of 12/11/23: 174 lb (78.9 kg).  LABS: CBC:    Component Value Date/Time   WBC 5.3 12/11/2023 0823   WBC 9.3 09/06/2023 0424   HGB 10.4 (L) 12/11/2023 0823   HGB 13.3 02/14/2023 1510   HCT 29.9 (L) 12/11/2023 0823   HCT 38.7 02/14/2023 1510   PLT 128 (L) 12/11/2023 0823   PLT 218 02/14/2023 1510   MCV  94.3 12/11/2023 0823   MCV 90 02/14/2023 1510   NEUTROABS 2.6 12/11/2023 0823   LYMPHSABS 1.4 12/11/2023 0823   MONOABS 0.9 12/11/2023 0823   EOSABS 0.3 12/11/2023 0823   BASOSABS 0.0 12/11/2023 0823   Comprehensive Metabolic Panel:    Component Value Date/Time   NA 136 12/11/2023 0823   NA 138 03/22/2023 1030   K 3.7 12/11/2023 0823   CL 108 12/11/2023 0823   CO2 20 (L) 12/11/2023 0823   BUN 15 12/11/2023 0823   BUN 24 03/22/2023 1030   CREATININE 0.87 12/11/2023 0823   GLUCOSE 193 (H) 12/11/2023 0823   CALCIUM 8.8 (L) 12/11/2023 0823   AST 21 12/11/2023 0823   ALT 18 12/11/2023 0823   ALKPHOS 56 12/11/2023 0823   BILITOT 0.4 12/11/2023 0823   PROT 6.1 (L) 12/11/2023 0823   PROT 7.1 02/14/2023 1510   ALBUMIN 3.3 (L) 12/11/2023 0823   ALBUMIN 4.1 02/14/2023 1510    RADIOGRAPHIC STUDIES: No results found.  PERFORMANCE STATUS (ECOG) : 1 - Symptomatic but completely ambulatory  Review of Systems Unless otherwise noted, a complete review of systems is negative.  Physical Exam General: NAD Cardiovascular: regular rate and rhythm Pulmonary: clear ant fields Abdomen: soft, nontender, + bowel sounds GU: no suprapubic tenderness Extremities: no edema, no joint deformities Skin: no  rashes Neurological: nonfocal  IMPRESSION/PLAN: Colon cancer -on systemic treatment with FOLFOX  Sinusitis -start Augmentin twice daily x 7 days.  Recommend Mucinex.  May continue Claritin.  Will also prescribe benzonatate as cough is keeping patient awake at night.  Follow-up parameters discussed.  Follow-up next week to see Dr. Donneta Romberg   Patient expressed understanding and was in agreement with this plan. He also understands that He can call clinic at any time with any questions, concerns, or complaints.   Thank you for allowing me to participate in the care of this very pleasant patient.   Time Total: 15 minutes  Visit consisted of counseling and education dealing with the complex  and emotionally intense issues of symptom management in the setting of serious illness.Greater than 50%  of this time was spent counseling and coordinating care related to the above assessment and plan.  Signed by: Laurette Schimke, PhD, NP-C

## 2023-12-25 ENCOUNTER — Inpatient Hospital Stay: Payer: Medicare PPO | Admitting: Internal Medicine

## 2023-12-25 ENCOUNTER — Encounter: Payer: Self-pay | Admitting: Internal Medicine

## 2023-12-25 ENCOUNTER — Inpatient Hospital Stay: Payer: Medicare PPO

## 2023-12-25 VITALS — BP 140/86 | HR 81 | Temp 98.1°F | Resp 16 | Wt 176.0 lb

## 2023-12-25 DIAGNOSIS — Z5111 Encounter for antineoplastic chemotherapy: Secondary | ICD-10-CM | POA: Diagnosis not present

## 2023-12-25 DIAGNOSIS — C186 Malignant neoplasm of descending colon: Secondary | ICD-10-CM

## 2023-12-25 LAB — CMP (CANCER CENTER ONLY)
ALT: 27 U/L (ref 0–44)
AST: 29 U/L (ref 15–41)
Albumin: 3.5 g/dL (ref 3.5–5.0)
Alkaline Phosphatase: 56 U/L (ref 38–126)
Anion gap: 6 (ref 5–15)
BUN: 20 mg/dL (ref 8–23)
CO2: 23 mmol/L (ref 22–32)
Calcium: 8.9 mg/dL (ref 8.9–10.3)
Chloride: 108 mmol/L (ref 98–111)
Creatinine: 0.98 mg/dL (ref 0.61–1.24)
GFR, Estimated: >60 mL/min (ref >60)
Glucose, Bld: 190 mg/dL — ABNORMAL HIGH (ref 70–99)
Potassium: 3.8 mmol/L (ref 3.5–5.1)
Sodium: 137 mmol/L (ref 135–145)
Total Bilirubin: 0.5 mg/dL (ref 0.0–1.2)
Total Protein: 6.3 g/dL — ABNORMAL LOW (ref 6.5–8.1)

## 2023-12-25 LAB — CBC WITH DIFFERENTIAL (CANCER CENTER ONLY)
Abs Immature Granulocytes: 0.03 10*3/uL (ref 0.00–0.07)
Basophils Absolute: 0 10*3/uL (ref 0.0–0.1)
Basophils Relative: 0 %
Eosinophils Absolute: 0.3 10*3/uL (ref 0.0–0.5)
Eosinophils Relative: 5 %
HCT: 29.4 % — ABNORMAL LOW (ref 39.0–52.0)
Hemoglobin: 10 g/dL — ABNORMAL LOW (ref 13.0–17.0)
Immature Granulocytes: 1 %
Lymphocytes Relative: 28 %
Lymphs Abs: 1.6 10*3/uL (ref 0.7–4.0)
MCH: 32.8 pg (ref 26.0–34.0)
MCHC: 34 g/dL (ref 30.0–36.0)
MCV: 96.4 fL (ref 80.0–100.0)
Monocytes Absolute: 0.9 10*3/uL (ref 0.1–1.0)
Monocytes Relative: 16 %
Neutro Abs: 2.8 10*3/uL (ref 1.7–7.7)
Neutrophils Relative %: 50 %
Platelet Count: 117 10*3/uL — ABNORMAL LOW (ref 150–400)
RBC: 3.05 MIL/uL — ABNORMAL LOW (ref 4.22–5.81)
RDW: 15.7 % — ABNORMAL HIGH (ref 11.5–15.5)
WBC Count: 5.6 10*3/uL (ref 4.0–10.5)
nRBC: 0 % (ref 0.0–0.2)

## 2023-12-25 MED ORDER — ALBUTEROL SULFATE HFA 108 (90 BASE) MCG/ACT IN AERS: 2.0000 | INHALATION_SPRAY | Freq: Four times a day (QID) | RESPIRATORY_TRACT | 2 refills | Status: DC | PRN

## 2023-12-25 MED ORDER — DEXAMETHASONE SODIUM PHOSPHATE 10 MG/ML IJ SOLN
10.0000 mg | Freq: Once | INTRAMUSCULAR | Status: AC
  Administered 2023-12-25: 10 mg via INTRAVENOUS
  Filled 2023-12-25: qty 1

## 2023-12-25 MED ORDER — PALONOSETRON HCL INJECTION 0.25 MG/5ML
0.2500 mg | Freq: Once | INTRAVENOUS | Status: AC
  Administered 2023-12-25: 0.25 mg via INTRAVENOUS
  Filled 2023-12-25: qty 5

## 2023-12-25 MED ORDER — FLUOROURACIL CHEMO INJECTION 5 GM/100ML
2400.0000 mg/m2 | INTRAVENOUS | Status: DC
  Administered 2023-12-25: 5000 mg via INTRAVENOUS
  Filled 2023-12-25: qty 100

## 2023-12-25 MED ORDER — DEXTROSE 5 % IV SOLN
INTRAVENOUS | Status: DC
  Filled 2023-12-25: qty 250

## 2023-12-25 MED ORDER — LEUCOVORIN CALCIUM INJECTION 350 MG
400.0000 mg/m2 | Freq: Once | INTRAVENOUS | Status: AC
  Administered 2023-12-25: 796 mg via INTRAVENOUS
  Filled 2023-12-25: qty 39.8

## 2023-12-25 MED ORDER — OXALIPLATIN CHEMO INJECTION 100 MG/20ML
85.0000 mg/m2 | Freq: Once | INTRAVENOUS | Status: AC
  Administered 2023-12-25: 170 mg via INTRAVENOUS
  Filled 2023-12-25: qty 34

## 2023-12-25 MED ORDER — FLUOROURACIL CHEMO INJECTION 2.5 GM/50ML
400.0000 mg/m2 | Freq: Once | INTRAVENOUS | Status: AC
  Administered 2023-12-25: 800 mg via INTRAVENOUS
  Filled 2023-12-25: qty 16

## 2023-12-25 NOTE — Progress Notes (Signed)
Patient has a sinus infection, he may need more amoxicillin. He is still having some burning while urinating, it isn't always just some times.

## 2023-12-25 NOTE — Progress Notes (Signed)
[facility] CONSULT NOTE  Patient Care Team: Ronnald Ramp, MD as PCP - General (Family Medicine) Benita Gutter, RN as Oncology Nurse Navigator Earna Coder, MD as Consulting Physician (Oncology)  CHIEF COMPLAINTS/PURPOSE OF CONSULTATION: Colon cancer  Oncology History Overview Note     Colon cancer, status post a partial left colectomy 09/04/2023, stage IIIa (pT2 pN1b) CT abdomen/pelvis 03/01/2023-prostate/pelvic abscess, large amount of colonic stool CT chest 03/30/2023-bilateral solid pulmonary nodules-likely benign, 17-month follow-up CT recommended Colonoscopy 04/12/2023-multiple polyps removed, greater than 50 mm polyp in the distal ascending colon and sigmoid colon not removed Colonoscopy 06/29/2023 greater than 50 mm polyps removed from the ascending, transverse, and descending colon-tubular adenomas, sigmoid lesion at 35 cm incompletely resected with biopsy revealing an ulcerated tubular adenoma with at least high-grade dysplasia and suspicious for invasive or [place] Descending colon resection 09/04/2023, pT2, PN 1B moderately differentiated adenocarcinoma, negative resection margins, lymphovascular invasion present, 3/11 lymph nodes, 2 tumor deposits, mismatch repair protein expression intact, MSS  Multiple colon polyps on colonoscopy 04/12/2023 and 06/29/2023-tubular adenomas Prostate cancer-Gleason 7 acinar adenocarcinoma involving 5-10% of submitted tissue from a TUR specimen 03/03/2023 Prostate 03/20/2023-mild prostamegaly- PI-RADS category 5 lesion in the left peripheral zone and category 4 lesion in the left anterior peripheral zone and left anterior fibromuscular stroma, reduced size of prostate abscess with continued surrounding prostatitis and periprostatic stranding IMRT 20 6/24 - 07/20/2023  4.  Diabetes 5.  History of colon cancer-negative CancerNext expanded panel 08/29/2023 6.  History of lung nodules, ongoing tobacco use, followed in the lung  cancer screening clinic# prostate/pelvic abscess.  He was admitted for antibiotics and drainage of the pelvic abscess 03/01/2023. A TUR and was found to have a Gleason 7 prostate cancer involving 5-10% of the submitted tissue.  He was referred to Dr. Rushie Chestnut and completed external beam radiation to the prostate.  # MAY 2024 [screening colo]- A greater than 50 mm polyp was removed from the proximal ascending colon.  Additional greater than 50 m polyps were not removed from the distal ascending and sigmoid colon.  Additional smaller polyps were removed from the colon. The pathology revealed multiple fragments of tubular adenomas. Dr. Baird Lyons than 50 mm polyps were removed from the ascending, transverse, and descending colon.  A polypoid lesion at 35 cm was incompletely resected and tattooed.  The pathology from multiple polyps returned as tubular adenomas without high-grade dysplasia or carcinoma.  # OCTOBER 2024- STAGE III SIGMOID COLON CANCER [Dr.Sherril/Dr.White- [place]] pathology from the sigmoid resection returned as invasive moderately differentiated adenocarcinoma involving the descending colon.  Carcinoma invades into but not through the muscularis propria.  The resection margins are negative.  Lymphovascular invasion is present.  Metastatic carcinoma was identified in 3 of 11 lymph nodes and there were 2 tumor deposits.  Mismatch repair protein expression is intact.   # Multiple colon polyps on colonoscopy 04/12/2023 and 06/29/2023-tubular adenomas- s/p  Genetics counseling: negative CancerNext expanded panel 08/29/2023  # NOV 4th, 2024- FOLFOX q 2 w x 6 months.   # APRIL 2024-  Prostate cancer [s/p EBRT- Dr.Chrystal]-    Cancer of descending colon (HCC)  09/19/2023 Initial Diagnosis   Cancer of descending colon (HCC)   09/19/2023 Cancer Staging   Staging form: Colon and Rectum, AJCC 8th Edition - Pathologic: pT2, cM0 - Signed by Ladene Artist, MD on 09/19/2023 Histologic grading  system: 4 grade system Histologic grade (G): G2 Residual tumor (R): R0 - None Laterality: Left Lymph-vascular invasion (LVI): LVI  present/identified, NOS Tumor deposits (TD): Present Perineural invasion (PNI): Absent Microsatellite instability (MSI): Stable   09/19/2023 Cancer Staging   Staging form: Colon and Rectum, AJCC 8th Edition - Pathologic: Stage IIIA (pT2, pN1b, cM0) - Signed by Ladene Artist, MD on 09/19/2023   10/11/2023 -  Chemotherapy   Patient is on Treatment Plan : COLORECTAL FOLFOX q14d x 6 months      HISTORY OF PRESENTING ILLNESS: Patient ambulating-independently.  Alone.  Colin Griffin 73 y.o.  male pleasant patient with  stage III colon cancer s/p left hemicolectomy currently on adjuvant chemotherapy with FOLFOX is here for a follow up.  Patient has a sinus infection, he may need more amoxicillin. Cough is better; no fevers or  chills.   Patient has intermittent some burning while urinating,. No flank pain.   Mild tingling and numbness of the chemotherapy.  Otherwise currently resolved.  Denies any blood in stools or black-colored stools.  No nausea no vomiting.   Review of Systems  Constitutional:  Negative for chills, diaphoresis, fever, malaise/fatigue and weight loss.  HENT:  Negative for nosebleeds and sore throat.   Eyes:  Negative for double vision.  Respiratory:  Negative for cough, hemoptysis, sputum production, shortness of breath and wheezing.   Cardiovascular:  Negative for chest pain, palpitations, orthopnea and leg swelling.  Gastrointestinal:  Negative for abdominal pain, blood in stool, constipation, diarrhea, heartburn, melena, nausea and vomiting.  Genitourinary:  Negative for dysuria, frequency and urgency.  Musculoskeletal:  Negative for back pain and joint pain.  Skin: Negative.  Negative for itching and rash.  Neurological:  Negative for dizziness, tingling, focal weakness, weakness and headaches.  Endo/Heme/Allergies:  Does not  bruise/bleed easily.  Psychiatric/Behavioral:  Negative for depression. The patient is not nervous/anxious and does not have insomnia.     MEDICAL HISTORY:  Past Medical History:  Diagnosis Date   [facility]) April 2024   Constipation 03/04/2023   Diabetes mellitus without complication [facility]) March 2024    SURGICAL HISTORY: Past Surgical History:  Procedure Laterality Date   APPENDECTOMY     BIOPSY  06/29/2023   Procedure: BIOPSY;  Surgeon: Lemar Lofty., MD;  Location: [facility];  Service: Gastroenterology;;   COLONOSCOPY WITH PROPOFOL N/A 04/12/2023   Procedure: COLONOSCOPY WITH PROPOFOL;  Surgeon: [name], MD;  Location: [facility];  Service: Gastroenterology;  Laterality: N/A;   COLONOSCOPY WITH PROPOFOL N/A 06/29/2023   Procedure: COLONOSCOPY WITH PROPOFOL;  Surgeon: [name] Netty Starring., MD;  Location: [facility];  Service: Gastroenterology;  Laterality: N/A;   ENDOSCOPIC MUCOSAL RESECTION N/A 06/29/2023   Procedure: ENDOSCOPIC MUCOSAL RESECTION;  Surgeon: [name] Netty Starring., MD;  Location: [facility];  Service: Gastroenterology;  Laterality: N/A;   FLEXIBLE SIGMOIDOSCOPY N/A 09/04/2023   Procedure: FLEXIBLE SIGMOIDOSCOPY;  Surgeon: Andria Meuse, MD;  Location: [facility];  Service: General;  Laterality: N/A;   HEMOSTASIS CONTROL  06/29/2023   Procedure: HEMOSTASIS CONTROL;  Surgeon: Lemar Lofty., MD;  Location: [facility];  Service: Gastroenterology;;   IR IMAGING GUIDED PORT INSERTION  09/26/2023   IR RADIOLOGIST EVAL & MGMT  03/16/2023   PELVIC ABCESS DRAINAGE     March 2024   POLYPECTOMY  06/29/2023   Procedure: POLYPECTOMY;  Surgeon: [name] Netty Starring., MD;  Location: [facility];  Service: Gastroenterology;;   Sunnie Nielsen LIFTING INJECTION  06/29/2023   Procedure: SUBMUCOSAL LIFTING INJECTION;  Surgeon: Lemar Lofty., MD;  Location: [facility];  Service: Gastroenterology;;   SUBMUCOSAL TATTOO INJECTION  06/29/2023   Procedure: SUBMUCOSAL TATTOO INJECTION;  Surgeon: Lemar Lofty., MD;  Location: [facility];  Service: Gastroenterology;;   TRANSURETHRAL RESECTION OF PROSTATE N/A 03/03/2023   Procedure: TRANSURETHRAL RESECTION OF THE PROSTATE (TURP)/ UNROOFING OF PROSTATE ABSCESS;  Surgeon: Riki Altes, MD;  Location: [facility] ORS;  Service: Urology;  Laterality: N/A;    SOCIAL HISTORY: Social History   Socioeconomic History   Marital status: Married    Spouse name: Editor, commissioning   Number of children: 1   Years of education: Not on file   Highest education level: Master's degree (e.g., MA, MS, MEng, MEd, MSW, MBA)  Occupational History   Not on file  Tobacco Use   Smoking status: Every Day    Current packs/day: 1.00    Average packs/day: 1 pack/day for 54.0 years (54.0 ttl pk-yrs)    Types: Cigarettes   Smokeless tobacco: Never   Tobacco comments:    Reports that at one time he smoked 3 packs per day while in the [employer]  Vaping Use   Vaping status: Every Day   Start date: 08/08/2022   Substances: Flavoring  Substance and Sexual Activity   Alcohol use: Not Currently   Drug use: Never   Sexual activity: Not Currently    Birth control/protection: None  Other Topics Concern   Not on file  Social History Narrative   ** Merged History Encounter **       Retired from Holiday representative for 30 years    Social Drivers of Corporate investment banker Strain: Low Risk  (05/01/2023)   Overall Financial Resource Strain (CARDIA)    Difficulty of Paying Living Expenses: Not hard at all  Food Insecurity: No Food Insecurity (09/19/2023)   Hunger Vital Sign    Worried About Running Out of Food in the Last Year: Never true    Ran Out of Food in the Last Year: Never true  Transportation Needs: No Transportation Needs (09/19/2023)   PRAPARE - Administrator, Civil Service (Medical): No    Lack of Transportation (Non-Medical): No  Physical Activity: Sufficiently  Active (05/01/2023)   Exercise Vital Sign    Days of Exercise per Week: 6 days    Minutes of Exercise per Session: 150+ min  Stress: No Stress Concern Present (05/01/2023)   [employer] of Occupational Health - Occupational Stress Questionnaire    Feeling of Stress : Not at all  Social Connections: Moderately Integrated (05/01/2023)   Social Connection and Isolation Panel [NHANES]    Frequency of Communication with Friends and Family: Three times a week    Frequency of Social Gatherings with Friends and Family: Twice a week    Attends Religious Services: More than 4 times per year    Active Member of [employer] or Organizations: No    Attends Banker Meetings: Patient declined    Marital Status: Married  Catering manager Violence: Not At Risk (09/19/2023)   Humiliation, Afraid, Rape, and Kick questionnaire    Fear of Current or Ex-Partner: No    Emotionally Abused: No    Physically Abused: No    Sexually Abused: No    FAMILY HISTORY: Family History  Problem Relation Age of Onset   Atrial fibrillation Mother    Skin cancer Father        non-melanoma   Colon cancer Sister 54       Lynch Syndrome   Breast cancer Sister 33   Diabetes Brother  Throat cancer Maternal Uncle    Breast cancer Paternal Aunt 90 - 36   Colon cancer Maternal Grandmother 70   Alcohol abuse Maternal Grandfather    Breast cancer Paternal Grandmother 77   Prostate cancer Paternal Grandfather 50 - 23   Other Niece        2 nieces have Lynch Syndrome (brother's daughters)   Diabetes Other    Ovarian cancer Other 53       paternal first cousin once removed   Colon polyps Neg Hx    Stomach cancer Neg Hx    Esophageal cancer Neg Hx    Inflammatory bowel disease Neg Hx    Liver disease Neg Hx    Pancreatic cancer Neg Hx     ALLERGIES:  has no allergies on file.  MEDICATIONS:  Current Outpatient Medications  Medication Sig Dispense Refill   albuterol (VENTOLIN HFA) 108 (90 Base) MCG/ACT  inhaler Inhale 2 puffs into the lungs every 6 (six) hours as needed for wheezing or shortness of breath. 8 g 2   amoxicillin-clavulanate (AUGMENTIN) 875-125 MG tablet Take 1 tablet by mouth 2 (two) times daily. 14 tablet 0   benzonatate (TESSALON) 100 MG capsule Take 1 capsule (100 mg total) by mouth 3 (three) times daily as needed for cough. 20 capsule 0   Continuous Glucose Sensor (FREESTYLE LIBRE 3 SENSOR) MISC PLACE 1 SENSOR ON THE SKIN EVERY 14 DAYS. USE TO CHECK GLUCOSE CONTINUOUSLY 2 each 6   Dulaglutide (TRULICITY) 0.75 MG/0.5ML SOAJ INJECT 0.75 MG SUBCUTANEOUSLY ONE TIME PER WEEK 6 mL 3   ferrous sulfate 325 (65 FE) MG tablet Take 1 tablet (325 mg total) by mouth 2 (two) times daily with a meal. 60 tablet 0   lidocaine-prilocaine (EMLA) cream Apply 1 Application topically as needed. 30 g 1   metFORMIN (GLUCOPHAGE-XR) 500 MG 24 hr tablet TAKE 1 TABLET BY MOUTH EVERY DAY WITH BREAKFAST 90 tablet 1   ondansetron (ZOFRAN) 8 MG tablet One pill every 8 hours as needed for nausea/vomitting. (Patient not taking: Reported on 12/20/2023) 40 tablet 1   prochlorperazine (COMPAZINE) 10 MG tablet Take 1 tablet (10 mg total) by mouth every 6 (six) hours as needed for nausea or vomiting. (Patient not taking: Reported on 12/20/2023) 40 tablet 1   No current facility-administered medications for this visit.    PHYSICAL EXAMINATION:   Vitals:   12/25/23 0845  BP: (!) 140/86  Pulse: 81  Resp: 16  Temp: 98.1 F (36.7 C)  SpO2: 100%   Filed Weights   12/25/23 0845  Weight: 176 lb (79.8 kg)    Physical Exam Vitals and nursing note reviewed.  HENT:     Head: Normocephalic and atraumatic.     Mouth/Throat:     Pharynx: Oropharynx is clear.  Eyes:     Extraocular Movements: Extraocular movements intact.     Pupils: Pupils are equal, round, and reactive to light.  Cardiovascular:     Rate and Rhythm: Normal rate and regular rhythm.  Pulmonary:     Comments: Decreased breath sounds bilaterally.   Abdominal:     Palpations: Abdomen is soft.  Musculoskeletal:        General: Normal range of motion.     Cervical back: Normal range of motion.  Skin:    General: Skin is warm.  Neurological:     General: No focal deficit present.     Mental Status: He is alert and oriented to person, place, and time.  Psychiatric:  Behavior: Behavior normal.        Judgment: Judgment normal.     LABORATORY DATA:  I have reviewed the data as listed Lab Results  Component Value Date   WBC 5.6 12/25/2023   HGB 10.0 (L) 12/25/2023   HCT 29.4 (L) 12/25/2023   MCV 96.4 12/25/2023   PLT 117 (L) 12/25/2023   Recent Labs    03/01/23 1954 03/02/23 0504 11/13/23 0813 11/27/23 0815 12/11/23 0823  NA  --    < > 141 135 136  K  --    < > 4.2 3.9 3.7  CL  --    < > 109 106 108  CO2  --    < > 24 21* 20*  GLUCOSE  --    < > 149* 159* 193*  BUN  --    < > 13 19 15   CREATININE  --    < > 1.05 1.04 0.87  CALCIUM  --    < > 9.5 9.5 8.8*  GFRNONAA  --    < > >60 >60 >60  PROT 8.4*   < > 6.6 7.2 6.1*  ALBUMIN 3.2*   < > 3.7 3.9 3.3*  AST 40   < > 25 18 21   ALT 48*   < > 27 18 18   ALKPHOS 186*   < > 55 55 56  BILITOT 0.8   < > 0.3 0.8 0.4  BILIDIR 0.2  --   --   --   --   IBILI 0.6  --   --   --   --    < > = values in this interval not displayed.    RADIOGRAPHIC STUDIES: I have personally reviewed the radiological images as listed and agreed with the findings in the report. No results found.   Cancer of descending colon (HCC) # OCT 2024-Colon cancer, status post a partial left colectomy 09/04/2023, stage IIIa (pT2 pN1b)-negative resection margins, lymphovascular invasion present, 3/11 lymph nodes, 2 tumor deposits. [Drs.Sherrill; White;Masarouty]. mismatch repair protein expression intact. FOLFOX chemotherapy is given every 2 weeks  # Proceed with FOLFOX # 6 of planned 12.  Labs-CBC/chemistries were reviewed with the patient- Stable.   # Thrombocytopenia- > 100- sec to chemo- monitor  ro for now. Stable.   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 Stable.   # URI- s/p agumentin- s/p clairtin-D OTC.ok to proceed with chemo. Recommend albuterol prn; HOLD steroids given DM.   # 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 involving 5-10% of submitted tissue from a TUR specimen 03/03/2023; s/p EBRT- IMRT 20 6/24 - 07/20/2023- JAN 2024- PSA- WNL;  stable.   # Diabetes- Trulicity/metformin- CGM-monitor closely on chemotherapy/steroids- stable  # IDA- Hb 11- ferritin- on PO iron; hold off venofer for now. Recheck iron studies; and possible venofer. stable  # Acitive smoker: Recommend quitting smoking.  Continue lung cancer screening-   # IV access port   # DISPOSITION:  # FOLFOX-chemo today; - d-3 pump off;   # follow up in 2 weeks- MD; port/labs- cbc/cmp; FOLFOX; d-3 pump off;    # follow up in 4  weeks- MD; port/labs-- cbc/cmp; FOLFOX;possible   d-3 pump off;  Dr.B   Above plan of care was discussed with patient/family in detail.  My contact information was given to the patient/family.     Earna Coder, MD 12/25/2023 9:22 AM

## 2023-12-25 NOTE — Assessment & Plan Note (Signed)
#   OCT 2024-Colon cancer, status post a partial left colectomy 09/04/2023, stage IIIa (pT2 pN1b)-negative resection margins, lymphovascular invasion present, 3/11 lymph nodes, 2 tumor deposits. [Drs.Sherrill; White;Masarouty]. mismatch repair protein expression intact. FOLFOX chemotherapy is given every 2 weeks  # Proceed with FOLFOX # 6 of planned 12.  Labs-CBC/chemistries were reviewed with the patient- Stable.   # Thrombocytopenia- > 100- sec to chemo- monitor ro for now. Stable.   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 Stable.   # URI- s/p agumentin- s/p clairtin-D OTC.ok to proceed with chemo. Recommend albuterol prn; HOLD steroids given DM.   # 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 involving 5-10% of submitted tissue from a TUR specimen 03/03/2023; s/p EBRT- IMRT 20 6/24 - 07/20/2023- JAN 2024- PSA- WNL;  stable.   # Diabetes- Trulicity/metformin- CGM-monitor closely on chemotherapy/steroids- stable  # IDA- Hb 11- ferritin- on PO iron; hold off venofer for now. Recheck iron studies; and possible venofer. stable  # Acitive smoker: Recommend quitting smoking.  Continue lung cancer screening-   # IV access port   # DISPOSITION:  # FOLFOX-chemo today; - d-3 pump off;   # follow up in 2 weeks- MD; port/labs- cbc/cmp; FOLFOX; d-3 pump off;    # follow up in 4  weeks- MD; port/labs-- cbc/cmp; FOLFOX;possible   d-3 pump off;  Dr.B

## 2023-12-25 NOTE — Patient Instructions (Signed)
[facility] - A DEPT OF [facility]  Discharge Instructions: Thank you for choosing [facility] to provide your oncology and hematology care.  If you have a lab appointment with the Cancer Center, please go directly to the Cancer Center and check in at the registration area.  Wear comfortable clothing and clothing appropriate for easy access to any Portacath or PICC line.   We strive to give you quality time with your provider. You may need to reschedule your appointment if you arrive late (15 or more minutes).  Arriving late affects you and other patients whose appointments are after yours.  Also, if you miss three or more appointments without notifying the office, you may be dismissed from the clinic at the provider's discretion.      For prescription refill requests, have your pharmacy contact our office and allow 72 hours for refills to be completed.    Today you received the following chemotherapy and/or immunotherapy agents- oxaliplatin, leucovorin, 5FU      To help prevent nausea and vomiting after your treatment, we encourage you to take your nausea medication as directed.  BELOW ARE SYMPTOMS THAT SHOULD BE REPORTED IMMEDIATELY: *FEVER GREATER THAN 100.4 F (38 C) OR HIGHER *CHILLS OR SWEATING *NAUSEA AND VOMITING THAT IS NOT CONTROLLED WITH YOUR NAUSEA MEDICATION *UNUSUAL SHORTNESS OF BREATH *UNUSUAL BRUISING OR BLEEDING *URINARY PROBLEMS (pain or burning when urinating, or frequent urination) *BOWEL PROBLEMS (unusual diarrhea, constipation, pain near the anus) TENDERNESS IN MOUTH AND THROAT WITH OR WITHOUT PRESENCE OF ULCERS (sore throat, sores in mouth, or a toothache) UNUSUAL RASH, SWELLING OR PAIN  UNUSUAL VAGINAL DISCHARGE OR ITCHING   Items with * indicate a potential emergency and should be followed up as soon as possible or go to the Emergency Department if any problems should occur.  Please show the CHEMOTHERAPY ALERT CARD  or IMMUNOTHERAPY ALERT CARD at check-in to the Emergency Department and triage nurse.  Should you have questions after your visit or need to cancel or reschedule your appointment, please contact [facility] - A DEPT OF [facility]  [phone] and follow the prompts.  Office hours are 8:00 a.m. to 4:30 p.m. Monday - Friday. Please note that voicemails left after 4:00 p.m. may not be returned until the following business day.  We are closed weekends and major holidays. You have access to a nurse at all times for urgent questions. Please call the main number to the clinic [phone] and follow the prompts.  For any non-urgent questions, you may also contact your provider using MyChart. We now offer e-Visits for anyone 36 and older to request care online for non-urgent symptoms. For details visit [email or web address].   Also download the MyChart app! Go to the app store, search "MyChart", open the app, select [facility], and log in with your MyChart username and password.

## 2023-12-27 ENCOUNTER — Inpatient Hospital Stay: Payer: Medicare PPO

## 2023-12-27 VITALS — BP 142/88 | HR 78 | Temp 97.9°F | Resp 18

## 2023-12-27 DIAGNOSIS — Z5111 Encounter for antineoplastic chemotherapy: Secondary | ICD-10-CM | POA: Diagnosis not present

## 2023-12-27 MED ORDER — HEPARIN SOD (PORK) LOCK FLUSH 100 UNIT/ML IV SOLN
500.0000 [IU] | Freq: Once | INTRAVENOUS | Status: AC | PRN
  Administered 2023-12-27: 500 [IU]
  Filled 2023-12-27: qty 5

## 2023-12-27 MED ORDER — SODIUM CHLORIDE 0.9% FLUSH
10.0000 mL | INTRAVENOUS | Status: DC | PRN
Start: 2023-12-27 — End: 2023-12-27
  Administered 2023-12-27: 10 mL
  Filled 2023-12-27: qty 10

## 2024-01-08 ENCOUNTER — Inpatient Hospital Stay: Payer: Medicare PPO | Admitting: Internal Medicine

## 2024-01-08 ENCOUNTER — Encounter: Payer: Self-pay | Admitting: Internal Medicine

## 2024-01-08 ENCOUNTER — Inpatient Hospital Stay: Payer: Medicare PPO

## 2024-01-08 ENCOUNTER — Inpatient Hospital Stay: Payer: Medicare PPO | Attending: Oncology

## 2024-01-08 VITALS — BP 148/84 | HR 79 | Temp 96.7°F | Ht 73.0 in | Wt 176.0 lb

## 2024-01-08 VITALS — Resp 18

## 2024-01-08 DIAGNOSIS — Z7985 Long-term (current) use of injectable non-insulin antidiabetic drugs: Secondary | ICD-10-CM | POA: Diagnosis not present

## 2024-01-08 DIAGNOSIS — Z79899 Other long term (current) drug therapy: Secondary | ICD-10-CM | POA: Diagnosis not present

## 2024-01-08 DIAGNOSIS — Z5111 Encounter for antineoplastic chemotherapy: Secondary | ICD-10-CM | POA: Diagnosis present

## 2024-01-08 DIAGNOSIS — C186 Malignant neoplasm of descending colon: Secondary | ICD-10-CM

## 2024-01-08 DIAGNOSIS — C61 Malignant neoplasm of prostate: Secondary | ICD-10-CM | POA: Diagnosis present

## 2024-01-08 DIAGNOSIS — Z7984 Long term (current) use of oral hypoglycemic drugs: Secondary | ICD-10-CM | POA: Insufficient documentation

## 2024-01-08 DIAGNOSIS — J449 Chronic obstructive pulmonary disease, unspecified: Secondary | ICD-10-CM | POA: Diagnosis not present

## 2024-01-08 DIAGNOSIS — E119 Type 2 diabetes mellitus without complications: Secondary | ICD-10-CM | POA: Diagnosis not present

## 2024-01-08 DIAGNOSIS — F17210 Nicotine dependence, cigarettes, uncomplicated: Secondary | ICD-10-CM | POA: Diagnosis not present

## 2024-01-08 LAB — CBC WITH DIFFERENTIAL (CANCER CENTER ONLY)
Abs Immature Granulocytes: 0.01 10*3/uL (ref 0.00–0.07)
Basophils Absolute: 0 10*3/uL (ref 0.0–0.1)
Basophils Relative: 1 %
Eosinophils Absolute: 0.2 10*3/uL (ref 0.0–0.5)
Eosinophils Relative: 4 %
HCT: 29.6 % — ABNORMAL LOW (ref 39.0–52.0)
Hemoglobin: 10.5 g/dL — ABNORMAL LOW (ref 13.0–17.0)
Immature Granulocytes: 0 %
Lymphocytes Relative: 24 %
Lymphs Abs: 1.3 10*3/uL (ref 0.7–4.0)
MCH: 34.7 pg — ABNORMAL HIGH (ref 26.0–34.0)
MCHC: 35.5 g/dL (ref 30.0–36.0)
MCV: 97.7 fL (ref 80.0–100.0)
Monocytes Absolute: 0.8 10*3/uL (ref 0.1–1.0)
Monocytes Relative: 15 %
Neutro Abs: 3 10*3/uL (ref 1.7–7.7)
Neutrophils Relative %: 56 %
Platelet Count: 134 10*3/uL — ABNORMAL LOW (ref 150–400)
RBC: 3.03 MIL/uL — ABNORMAL LOW (ref 4.22–5.81)
RDW: 16.1 % — ABNORMAL HIGH (ref 11.5–15.5)
WBC Count: 5.4 10*3/uL (ref 4.0–10.5)
nRBC: 0 % (ref 0.0–0.2)

## 2024-01-08 LAB — CMP (CANCER CENTER ONLY)
ALT: 16 U/L (ref 0–44)
AST: 20 U/L (ref 15–41)
Albumin: 3.7 g/dL (ref 3.5–5.0)
Alkaline Phosphatase: 56 U/L (ref 38–126)
Anion gap: 7 (ref 5–15)
BUN: 27 mg/dL — ABNORMAL HIGH (ref 8–23)
CO2: 21 mmol/L — ABNORMAL LOW (ref 22–32)
Calcium: 8.9 mg/dL (ref 8.9–10.3)
Chloride: 108 mmol/L (ref 98–111)
Creatinine: 0.94 mg/dL (ref 0.61–1.24)
GFR, Estimated: >60 mL/min (ref >60)
Glucose, Bld: 191 mg/dL — ABNORMAL HIGH (ref 70–99)
Potassium: 3.7 mmol/L (ref 3.5–5.1)
Sodium: 136 mmol/L (ref 135–145)
Total Bilirubin: 0.6 mg/dL (ref 0.0–1.2)
Total Protein: 6.5 g/dL (ref 6.5–8.1)

## 2024-01-08 MED ORDER — SODIUM CHLORIDE 0.9 % IV SOLN
2400.0000 mg/m2 | INTRAVENOUS | Status: DC
  Administered 2024-01-08: 5000 mg via INTRAVENOUS
  Filled 2024-01-08: qty 100

## 2024-01-08 MED ORDER — PALONOSETRON HCL INJECTION 0.25 MG/5ML
0.2500 mg | Freq: Once | INTRAVENOUS | Status: AC
  Administered 2024-01-08: 0.25 mg via INTRAVENOUS
  Filled 2024-01-08: qty 5

## 2024-01-08 MED ORDER — DEXTROSE 5 % IV SOLN
INTRAVENOUS | Status: DC
  Filled 2024-01-08: qty 250

## 2024-01-08 MED ORDER — DEXAMETHASONE SODIUM PHOSPHATE 10 MG/ML IJ SOLN
10.0000 mg | Freq: Once | INTRAMUSCULAR | Status: AC
Start: 2024-01-08 — End: 2024-01-08
  Administered 2024-01-08: 10 mg via INTRAVENOUS
  Filled 2024-01-08: qty 1

## 2024-01-08 MED ORDER — FLUOROURACIL CHEMO INJECTION 2.5 GM/50ML
400.0000 mg/m2 | Freq: Once | INTRAVENOUS | Status: AC
  Administered 2024-01-08: 800 mg via INTRAVENOUS
  Filled 2024-01-08: qty 16

## 2024-01-08 MED ORDER — DEXTROSE 5 % IV SOLN
400.0000 mg/m2 | Freq: Once | INTRAVENOUS | Status: AC
  Administered 2024-01-08: 796 mg via INTRAVENOUS
  Filled 2024-01-08: qty 39.8

## 2024-01-08 MED ORDER — OXALIPLATIN CHEMO INJECTION 100 MG/20ML
85.0000 mg/m2 | Freq: Once | INTRAVENOUS | Status: AC
  Administered 2024-01-08: 170 mg via INTRAVENOUS
  Filled 2024-01-08: qty 34

## 2024-01-08 NOTE — Assessment & Plan Note (Addendum)
#   OCT 2024-Colon cancer, status post a partial left colectomy 09/04/2023, stage IIIa (pT2 pN1b)-negative resection margins, lymphovascular invasion present, 3/11 lymph nodes, 2 tumor deposits. [Drs.Sherrill; White;Masarouty]. mismatch repair protein expression intact. FOLFOX chemotherapy is given every 2 weeks  # Proceed with FOLFOX # 7 of planned 12.  Labs-CBC/chemistries were reviewed with the patient- Stable.   # Thrombocytopenia- > 100- sec to chemo- monitor ro for now. Stable.   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 Stable.   # COPD/URI- s/p agumentin- s/p clairtin-D OTC.ok to proceed with chemo. Recommend albuterol  prn; HOLD steroids given DM.; # referral to [facility] re: COPD   # 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 involving 5-10% of submitted tissue from a TUR specimen 03/03/2023; s/p EBRT- IMRT 20 6/24 - 07/20/2023- JAN 2024- PSA- WNL;  stable.   # Diabetes- Trulicity /metformin - CGM-monitor closely on chemotherapy/steroids- stable  # IDA- Hb 11- ferritin- on PO iron; hold off venofer for now. Recheck iron studies; and possible venofer. stable  # Acitive smoker: Recommend quitting smoking.  Continue lung cancer screening-   # IV access port   # DISPOSITION:  # referral to [facility] re: COPD # FOLFOX-chemo today; - d-3 pump off;   # follow up in 2 weeks- MD; port/labs- cbc/cmp; FOLFOX; d-3 pump off;    # follow up in 4  weeks- MD; port/labs-- cbc/cmp; FOLFOX;possible   d-3 pump off;  Dr.B

## 2024-01-08 NOTE — Progress Notes (Signed)
 Pt states he only has treatment appts thru 01/22/24, is this right? He thought he was getting more.  Having some nausea but not enough to need his meds.  C/o burning with urination but feels like it is coming from the rectum.  Still having some sinus stuffiness. Feel like sinus infection coming back.

## 2024-01-08 NOTE — Progress Notes (Signed)
 [facility] CONSULT NOTE  Patient Care Team: Mimi Alt, MD as PCP - General (Family Medicine) Rochell Chroman, RN as Oncology Nurse Navigator Gwyn Leos, MD as Consulting Physician (Oncology)  CHIEF COMPLAINTS/PURPOSE OF CONSULTATION: Colon cancer  Oncology History Overview Note     Colon cancer, status post a partial left colectomy 09/04/2023, stage IIIa (pT2 pN1b) CT abdomen/pelvis 03/01/2023-prostate/pelvic abscess, large amount of colonic stool CT chest 03/30/2023-bilateral solid pulmonary nodules-likely benign, 34-month follow-up CT recommended Colonoscopy 04/12/2023-multiple polyps removed, greater than 50 mm polyp in the distal ascending colon and sigmoid colon not removed Colonoscopy 06/29/2023 greater than 50 mm polyps removed from the ascending, transverse, and descending colon-tubular adenomas, sigmoid lesion at 35 cm incompletely resected with biopsy revealing an ulcerated tubular adenoma with at least high-grade dysplasia and suspicious for invasive or [place] Descending colon resection 09/04/2023, pT2, PN 1B moderately differentiated adenocarcinoma, negative resection margins, lymphovascular invasion present, 3/11 lymph nodes, 2 tumor deposits, mismatch repair protein expression intact, MSS  Multiple colon polyps on colonoscopy 04/12/2023 and 06/29/2023-tubular adenomas Prostate cancer-Gleason 7 acinar adenocarcinoma involving 5-10% of submitted tissue from a TUR specimen 03/03/2023 Prostate 03/20/2023-mild prostamegaly- PI-RADS category 5 lesion in the left peripheral zone and category 4 lesion in the left anterior peripheral zone and left anterior fibromuscular stroma, reduced size of prostate abscess with continued surrounding prostatitis and periprostatic stranding IMRT 20 6/24 - 07/20/2023  4.  Diabetes 5.  History of colon cancer-negative CancerNext expanded panel 08/29/2023 6.  History of lung nodules, ongoing tobacco use, followed in the lung  cancer screening clinic# prostate/pelvic abscess.  He was admitted for antibiotics and drainage of the pelvic abscess 03/01/2023. A TUR and was found to have a Gleason 7 prostate cancer involving 5-10% of the submitted tissue.  He was referred to Dr. Jacalyn Martin and completed external beam radiation to the prostate.  # MAY 2024 [screening colo]- A greater than 50 mm polyp was removed from the proximal ascending colon.  Additional greater than 50 m polyps were not removed from the distal ascending and sigmoid colon.  Additional smaller polyps were removed from the colon. The pathology revealed multiple fragments of tubular adenomas. Dr. Ignacia Maki than 50 mm polyps were removed from the ascending, transverse, and descending colon.  A polypoid lesion at 35 cm was incompletely resected and tattooed.  The pathology from multiple polyps returned as tubular adenomas without high-grade dysplasia or carcinoma.  # OCTOBER 2024- STAGE III SIGMOID COLON CANCER [Dr.Sherril/Dr.White- [place]] pathology from the sigmoid resection returned as invasive moderately differentiated adenocarcinoma involving the descending colon.  Carcinoma invades into but not through the muscularis propria.  The resection margins are negative.  Lymphovascular invasion is present.  Metastatic carcinoma was identified in 3 of 11 lymph nodes and there were 2 tumor deposits.  Mismatch repair protein expression is intact.   # Multiple colon polyps on colonoscopy 04/12/2023 and 06/29/2023-tubular adenomas- s/p  Genetics counseling: negative CancerNext expanded panel 08/29/2023  # NOV 4th, 2024- FOLFOX q 2 w x 6 months.   # APRIL 2024-  Prostate cancer [s/p EBRT- Dr.Chrystal]-    Cancer of descending colon (HCC)  09/19/2023 Initial Diagnosis   Cancer of descending colon (HCC)   09/19/2023 Cancer Staging   Staging form: Colon and Rectum, AJCC 8th Edition - Pathologic: pT2, cM0 - Signed by Sumner Ends, MD on 09/19/2023 Histologic grading  system: 4 grade system Histologic grade (G): G2 Residual tumor (R): R0 - None Laterality: Left Lymph-vascular invasion (LVI): LVI  present/identified, NOS Tumor deposits (TD): Present Perineural invasion (PNI): Absent Microsatellite instability (MSI): Stable   09/19/2023 Cancer Staging   Staging form: Colon and Rectum, AJCC 8th Edition - Pathologic: Stage IIIA (pT2, pN1b, cM0) - Signed by Sumner Ends, MD on 09/19/2023   10/11/2023 -  Chemotherapy   Patient is on Treatment Plan : COLORECTAL FOLFOX q14d x 6 months      HISTORY OF PRESENTING ILLNESS: Patient ambulating-independently.  Alone.  Rod Gene Avalo 73 y.o.  male pleasant patient with  stage III colon cancer s/p left hemicolectomy currently on adjuvant chemotherapy with FOLFOX is here for a follow up.    Having some nausea but not enough to need his meds. Patient continues to cough; phelgm in morning. Cough is better, but resolved.  no fevers or  chills.   Mild tingling and numbness of the chemotherapy.  Otherwise currently resolved.  Denies any blood in stools or black-colored stools.  No nausea no vomiting.   Review of Systems  Constitutional:  Negative for chills, diaphoresis, fever, malaise/fatigue and weight loss.  HENT:  Negative for nosebleeds and sore throat.   Eyes:  Negative for double vision.  Respiratory:  Positive for cough. Negative for hemoptysis, sputum production, shortness of breath and wheezing.   Cardiovascular:  Negative for chest pain, palpitations, orthopnea and leg swelling.  Gastrointestinal:  Positive for nausea. Negative for abdominal pain, blood in stool, constipation, diarrhea, heartburn, melena and vomiting.  Genitourinary:  Negative for dysuria, frequency and urgency.  Musculoskeletal:  Negative for back pain and joint pain.  Skin: Negative.  Negative for itching and rash.  Neurological:  Negative for dizziness, tingling, focal weakness, weakness and headaches.  Endo/Heme/Allergies:   Does not bruise/bleed easily.  Psychiatric/Behavioral:  Negative for depression. The patient is not nervous/anxious and does not have insomnia.     MEDICAL HISTORY:  Past Medical History:  Diagnosis Date   Cancer [facility]) April 2024   Constipation 03/04/2023   Diabetes mellitus without complication [facility]) March 2024    SURGICAL HISTORY: Past Surgical History:  Procedure Laterality Date   APPENDECTOMY     BIOPSY  06/29/2023   Procedure: BIOPSY;  Surgeon: Brice Campi Albino Alu., MD;  Location: [facility];  Service: Gastroenterology;;   COLONOSCOPY WITH PROPOFOL  N/A 04/12/2023   Procedure: COLONOSCOPY WITH PROPOFOL ;  Surgeon: Selena Daily, MD;  Location: [facility] END[facility]COPY;  Service: Gastroenterology;  Laterality: N/A;   COLONOSCOPY WITH PROPOFOL  N/A 06/29/2023   Procedure: COLONOSCOPY WITH PROPOFOL ;  Surgeon: Mansouraty, Albino Alu., MD;  Location: [facility] ENDOSCOPY;  Service: Gastroenterology;  Laterality: N/A;   ENDOSCOPIC MUCOSAL RESECTION N/A 06/29/2023   Procedure: ENDOSCOPIC MUCOSAL RESECTION;  Surgeon: Brice Campi Albino Alu., MD;  Location: [facility] ENDOSCOPY;  Service: Gastroenterology;  Laterality: N/A;   FLEXIBLE SIGMOIDOSCOPY N/A 09/04/2023   Procedure: FLEXIBLE SIGMOIDOSCOPY;  Surgeon: Melvenia Stabs, MD;  Location: [facility] ORS;  Service: General;  Laterality: N/A;   HEMOSTASIS CONTROL  06/29/2023   Procedure: HEMOSTASIS CONTROL;  Surgeon: Normie Becton., MD;  Location: [facility];  Service: Gastroenterology;;   IR IMAGING GUIDED PORT INSERTION  09/26/2023   IR RADIOLOGIST EVAL & MGMT  03/16/2023   PELVIC ABCESS DRAINAGE     March 2024   POLYPECTOMY  06/29/2023   Procedure: POLYPECTOMY;  Surgeon: Brice Campi Albino Alu., MD;  Location: [facility];  Service: Gastroenterology;;   [place] LIFTING INJECTION  06/29/2023   Procedure: SUBMUCOSAL LIFTING INJECTION;  Surgeon: Normie Becton., MD;  Location: [facility];  Service: Gastroenterology;;  SUBMUCOSAL TATTOO  INJECTION  06/29/2023   Procedure: SUBMUCOSAL TATTOO INJECTION;  Surgeon: Normie Becton., MD;  Location: [facility];  Service: Gastroenterology;;   TRANSURETHRAL RESECTION OF PROSTATE N/A 03/03/2023   Procedure: TRANSURETHRAL RESECTION OF THE PROSTATE (TURP)/ UNROOFING OF PROSTATE ABSCESS;  Surgeon: Geraline Knapp, MD;  Location: [facility] ORS;  Service: Urology;  Laterality: N/A;    SOCIAL HISTORY: Social History   Socioeconomic History   Marital status: Married    Spouse name: Editor, commissioning   Number of children: 1   Years of education: Not on file   Highest education level: Master's degree (e.g., MA, MS, MEng, MEd, MSW, MBA)  Occupational History   Not on file  Tobacco Use   Smoking status: Every Day    Current packs/day: 1.00    Average packs/day: 1 pack/day for 54.0 years (54.0 ttl pk-yrs)    Types: Cigarettes   Smokeless tobacco: Never   Tobacco comments:    Reports that at one time he smoked 3 packs per day while in the [employer]  Vaping Use   Vaping status: Every Day   Start date: 08/08/2022   Substances: Flavoring  Substance and Sexual Activity   Alcohol use: Not Currently   Drug use: Never   Sexual activity: Not Currently    Birth control/protection: None  Other Topics Concern   Not on file  Social History Narrative   ** Merged History Encounter **       Retired from Holiday representative for 30 years    Social Drivers of Corporate investment banker Strain: Low Risk  (05/01/2023)   Overall Financial Resource Strain (CARDIA)    Difficulty of Paying Living Expenses: Not hard at all  Food Insecurity: No Food Insecurity (09/19/2023)   Hunger Vital Sign    Worried About Running Out of Food in the Last Year: Never true    Ran Out of Food in the Last Year: Never true  Transportation Needs: No Transportation Needs (09/19/2023)   PRAPARE - Administrator, Civil Service (Medical): No    Lack of Transportation (Non-Medical): No  Physical Activity:  Sufficiently Active (05/01/2023)   Exercise Vital Sign    Days of Exercise per Week: 6 days    Minutes of Exercise per Session: 150+ min  Stress: No Stress Concern Present (05/01/2023)   [employer] of Occupational Health - Occupational Stress Questionnaire    Feeling of Stress : Not at all  Social Connections: Moderately Integrated (05/01/2023)   Social Connection and Isolation Panel [NHANES]    Frequency of Communication with Friends and Family: Three times a week    Frequency of Social Gatherings with Friends and Family: Twice a week    Attends Religious Services: More than 4 times per year    Active Member of [employer] or Organizations: No    Attends Banker Meetings: Patient declined    Marital Status: Married  Catering manager Violence: Not At Risk (09/19/2023)   Humiliation, Afraid, Rape, and Kick questionnaire    Fear of Current or Ex-Partner: No    Emotionally Abused: No    Physically Abused: No    Sexually Abused: No    FAMILY HISTORY: Family History  Problem Relation Age of Onset   Atrial fibrillation Mother    Skin cancer Father        non-melanoma   Colon cancer Sister 39       Lynch Syndrome   Breast cancer Sister 8  Diabetes Brother    Throat cancer Maternal Uncle    Breast cancer Paternal Aunt 38 - 36   Colon cancer Maternal Grandmother 49   Alcohol abuse Maternal Grandfather    Breast cancer Paternal Grandmother 19   Prostate cancer Paternal Grandfather 17 - 56   Other Niece        2 nieces have Lynch Syndrome (brother's daughters)   Diabetes Other    Ovarian cancer Other 2       paternal first cousin once removed   Colon polyps Neg Hx    Stomach cancer Neg Hx    Esophageal cancer Neg Hx    Inflammatory bowel disease Neg Hx    Liver disease Neg Hx    Pancreatic cancer Neg Hx     ALLERGIES:  has no allergies on file.  MEDICATIONS:  Current Outpatient Medications  Medication Sig Dispense Refill   albuterol  (VENTOLIN  HFA) 108 (90  Base) MCG/ACT inhaler Inhale 2 puffs into the lungs every 6 (six) hours as needed for wheezing or shortness of breath. 8 g 2   amoxicillin -clavulanate (AUGMENTIN ) 875-125 MG tablet Take 1 tablet by mouth 2 (two) times daily. 14 tablet 0   benzonatate  (TESSALON ) 100 MG capsule Take 1 capsule (100 mg total) by mouth 3 (three) times daily as needed for cough. 20 capsule 0   Continuous Glucose Sensor (FREESTYLE LIBRE 3 SENSOR) MISC PLACE 1 SENSOR ON THE SKIN EVERY 14 DAYS. USE TO CHECK GLUCOSE CONTINUOUSLY 2 each 6   Dulaglutide  (TRULICITY ) 0.75 MG/0.5ML SOAJ INJECT 0.75 MG SUBCUTANEOUSLY ONE TIME PER WEEK 6 mL 3   lidocaine -prilocaine  (EMLA ) cream Apply 1 Application topically as needed. 30 g 1   metFORMIN  (GLUCOPHAGE -XR) 500 MG 24 hr tablet TAKE 1 TABLET BY MOUTH EVERY DAY WITH BREAKFAST 90 tablet 1   ondansetron  (ZOFRAN ) 8 MG tablet One pill every 8 hours as needed for nausea/vomitting. 40 tablet 1   prochlorperazine  (COMPAZINE ) 10 MG tablet Take 1 tablet (10 mg total) by mouth every 6 (six) hours as needed for nausea or vomiting. 40 tablet 1   ferrous sulfate  325 (65 FE) MG tablet Take 1 tablet (325 mg total) by mouth 2 (two) times daily with a meal. 60 tablet 0   No current facility-administered medications for this visit.   Facility-Administered Medications Ordered in Other Visits  Medication Dose Route Frequency Provider Last Rate Last Admin   dextrose  5 % solution   Intravenous Continuous Cougar Imel R, MD 10 mL/hr at 01/08/24 1016 New Bag at 01/08/24 1016   fluorouracil  (ADRUCIL ) 5,000 mg in sodium chloride  0.9 % 150 mL chemo infusion  2,400 mg/m2 (Treatment Plan Recorded) Intravenous 1 day or 1 dose Lenell Mcconnell R, MD       fluorouracil  (ADRUCIL ) chemo injection 800 mg  400 mg/m2 (Treatment Plan Recorded) Intravenous Once Rebacca Votaw R, MD       leucovorin  796 mg in dextrose  5 % 250 mL infusion  400 mg/m2 (Treatment Plan Recorded) Intravenous Once Lugene Hitt R,  MD       oxaliplatin  (ELOXATIN ) 170 mg in dextrose  5 % 500 mL chemo infusion  85 mg/m2 (Treatment Plan Recorded) Intravenous Once Mailani Degroote R, MD        PHYSICAL EXAMINATION:   Vitals:   01/08/24 0905  BP: (!) 148/84  Pulse: 79  Temp: (!) 96.7 F (35.9 C)  SpO2: 97%   Filed Weights   01/08/24 0905  Weight: 176 lb (79.8 kg)    Physical  Exam Vitals and nursing note reviewed.  HENT:     Head: Normocephalic and atraumatic.     Mouth/Throat:     Pharynx: Oropharynx is clear.  Eyes:     Extraocular Movements: Extraocular movements intact.     Pupils: Pupils are equal, round, and reactive to light.  Cardiovascular:     Rate and Rhythm: Normal rate and regular rhythm.  Pulmonary:     Comments: Decreased breath sounds bilaterally.  Abdominal:     Palpations: Abdomen is soft.  Musculoskeletal:        General: Normal range of motion.     Cervical back: Normal range of motion.  Skin:    General: Skin is warm.  Neurological:     General: No focal deficit present.     Mental Status: He is alert and oriented to person, place, and time.  Psychiatric:        Behavior: Behavior normal.        Judgment: Judgment normal.     LABORATORY DATA:  I have reviewed the data as listed Lab Results  Component Value Date   WBC 5.4 01/08/2024   HGB 10.5 (L) 01/08/2024   HCT 29.6 (L) 01/08/2024   MCV 97.7 01/08/2024   PLT 134 (L) 01/08/2024   Recent Labs    03/01/23 1954 03/02/23 0504 12/11/23 0823 12/25/23 0830 01/08/24 0857  NA  --    < > 136 137 136  K  --    < > 3.7 3.8 3.7  CL  --    < > 108 108 108  CO2  --    < > 20* 23 21*  GLUCOSE  --    < > 193* 190* 191*  BUN  --    < > 15 20 27*  CREATININE  --    < > 0.87 0.98 0.94  CALCIUM   --    < > 8.8* 8.9 8.9  GFRNONAA  --    < > >60 >60 >60  PROT 8.4*   < > 6.1* 6.3* 6.5  ALBUMIN 3.2*   < > 3.3* 3.5 3.7  AST 40   < > 21 29 20   ALT 48*   < > 18 27 16   ALKPHOS 186*   < > 56 56 56  BILITOT 0.8   < > 0.4 0.5 0.6   BILIDIR 0.2  --   --   --   --   IBILI 0.6  --   --   --   --    < > = values in this interval not displayed.    RADIOGRAPHIC STUDIES: I have personally reviewed the radiological images as listed and agreed with the findings in the report. No results found.   Cancer of descending colon (HCC) # OCT 2024-Colon cancer, status post a partial left colectomy 09/04/2023, stage IIIa (pT2 pN1b)-negative resection margins, lymphovascular invasion present, 3/11 lymph nodes, 2 tumor deposits. [Drs.Sherrill; White;Masarouty]. mismatch repair protein expression intact. FOLFOX chemotherapy is given every 2 weeks  # Proceed with FOLFOX # 7 of planned 12.  Labs-CBC/chemistries were reviewed with the patient- Stable.   # Thrombocytopenia- > 100- sec to chemo- monitor ro for now. Stable.   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 Stable.   # COPD/URI- s/p agumentin- s/p clairtin-D OTC.ok to proceed with chemo. Recommend albuterol  prn; HOLD steroids given DM.; # referral to [facility] re: COPD   # 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 involving 5-10% of submitted  tissue from a TUR specimen 03/03/2023; s/p EBRT- IMRT 20 6/24 - 07/20/2023- JAN 2024- PSA- WNL;  stable.   # Diabetes- Trulicity /metformin - CGM-monitor closely on chemotherapy/steroids- stable  # IDA- Hb 11- ferritin- on PO iron; hold off venofer for now. Recheck iron studies; and possible venofer. stable  # Acitive smoker: Recommend quitting smoking.  Continue lung cancer screening-   # IV access port   # DISPOSITION:  # referral to [facility] re: COPD # FOLFOX-chemo today; - d-3 pump off;   # follow up in 2 weeks- MD; port/labs- cbc/cmp; FOLFOX; d-3 pump off;    # follow up in 4  weeks- MD; port/labs-- cbc/cmp; FOLFOX;possible   d-3 pump off;  Dr.B   Above plan of care was discussed with patient/family in detail.  My contact information was given to the patient/family.     Gwyn Leos, MD 01/08/2024 10:21  AM

## 2024-01-08 NOTE — Patient Instructions (Signed)
 [facility] - A DEPT OF [facility]  Discharge Instructions: Thank you for choosing [facility] to provide your oncology and hematology care.  If you have a lab appointment with the Cancer Center, please go directly to the Cancer Center and check in at the registration area.  Wear comfortable clothing and clothing appropriate for easy access to any Portacath or PICC line.   We strive to give you quality time with your provider. You may need to reschedule your appointment if you arrive late (15 or more minutes).  Arriving late affects you and other patients whose appointments are after yours.  Also, if you miss three or more appointments without notifying the office, you may be dismissed from the clinic at the provider's discretion.      For prescription refill requests, have your pharmacy contact our office and allow 72 hours for refills to be completed.    Today you received the following chemotherapy and/or immunotherapy agents OXALIPLATIN, LEUCOVORIN, 5 FU      To help prevent nausea and vomiting after your treatment, we encourage you to take your nausea medication as directed.  BELOW ARE SYMPTOMS THAT SHOULD BE REPORTED IMMEDIATELY: *FEVER GREATER THAN 100.4 F (38 C) OR HIGHER *CHILLS OR SWEATING *NAUSEA AND VOMITING THAT IS NOT CONTROLLED WITH YOUR NAUSEA MEDICATION *UNUSUAL SHORTNESS OF BREATH *UNUSUAL BRUISING OR BLEEDING *URINARY PROBLEMS (pain or burning when urinating, or frequent urination) *BOWEL PROBLEMS (unusual diarrhea, constipation, pain near the anus) TENDERNESS IN MOUTH AND THROAT WITH OR WITHOUT PRESENCE OF ULCERS (sore throat, sores in mouth, or a toothache) UNUSUAL RASH, SWELLING OR PAIN  UNUSUAL VAGINAL DISCHARGE OR ITCHING   Items with * indicate a potential emergency and should be followed up as soon as possible or go to the Emergency Department if any problems should occur.  Please show the CHEMOTHERAPY ALERT CARD  or IMMUNOTHERAPY ALERT CARD at check-in to the Emergency Department and triage nurse.  Should you have questions after your visit or need to cancel or reschedule your appointment, please contact [facility] - A DEPT OF [facility]  [phone] and follow the prompts.  Office hours are 8:00 a.m. to 4:30 p.m. Monday - Friday. Please note that voicemails left after 4:00 p.m. may not be returned until the following business day.  We are closed weekends and major holidays. You have access to a nurse at all times for urgent questions. Please call the main number to the clinic [phone] and follow the prompts.  For any non-urgent questions, you may also contact your provider using MyChart. We now offer e-Visits for anyone 22 and older to request care online for non-urgent symptoms. For details visit [email or web address].   Also download the MyChart app! Go to the app store, search "MyChart", open the app, select [facility], and log in with your MyChart username and password.  Oxaliplatin Injection What is this medication? OXALIPLATIN (ox AL i PLA tin) treats colorectal cancer. It works by slowing down the growth of cancer cells. This medicine may be used for other purposes; ask your health care provider or pharmacist if you have questions. COMMON BRAND NAME(S): Eloxatin What should I tell my care team before I take this medication? They need to know if you have any of these conditions: Heart disease History of irregular heartbeat or rhythm Liver disease Low blood cell levels (white cells, red cells, and platelets) Lung or breathing disease,  such as asthma Take medications that treat or prevent blood clots Tingling of the fingers, toes, or other nerve disorder An unusual or allergic reaction to oxaliplatin, other medications, foods, dyes, or preservatives If you or your partner are pregnant or trying to get pregnant Breast-feeding How should I use this  medication? This medication is injected into a vein. It is given by your care team in a hospital or clinic setting. Talk to your care team about the use of this medication in children. Special care may be needed. Overdosage: If you think you have taken too much of this medicine contact a poison control center or emergency room at once. NOTE: This medicine is only for you. Do not share this medicine with others. What if I miss a dose? Keep appointments for follow-up doses. It is important not to miss a dose. Call your care team if you are unable to keep an appointment. What may interact with this medication? Do not take this medication with any of the following: Cisapride Dronedarone Pimozide Thioridazine This medication may also interact with the following: Aspirin and aspirin-like medications Certain medications that treat or prevent blood clots, such as warfarin, apixaban, dabigatran, and rivaroxaban Cisplatin Cyclosporine Diuretics Medications for infection, such as acyclovir, adefovir, amphotericin B, bacitracin, cidofovir, foscarnet, ganciclovir, gentamicin, pentamidine, vancomycin NSAIDs, medications for pain and inflammation, such as ibuprofen or naproxen Other medications that cause heart rhythm changes Pamidronate Zoledronic acid This list may not describe all possible interactions. Give your health care provider a list of all the medicines, herbs, non-prescription drugs, or dietary supplements you use. Also tell them if you smoke, drink alcohol, or use illegal drugs. Some items may interact with your medicine. What should I watch for while using this medication? Your condition will be monitored carefully while you are receiving this medication. You may need blood work while taking this medication. This medication may make you feel generally unwell. This is not uncommon as chemotherapy can affect healthy cells as well as cancer cells. Report any side effects. Continue your course  of treatment even though you feel ill unless your care team tells you to stop. This medication may increase your risk of getting an infection. Call your care team for advice if you get a fever, chills, sore throat, or other symptoms of a cold or flu. Do not treat yourself. Try to avoid being around people who are sick. Avoid taking medications that contain aspirin, acetaminophen, ibuprofen, naproxen, or ketoprofen unless instructed by your care team. These medications may hide a fever. Be careful brushing or flossing your teeth or using a toothpick because you may get an infection or bleed more easily. If you have any dental work done, tell your dentist you are receiving this medication. This medication can make you more sensitive to cold. Do not drink cold drinks or use ice. Cover exposed skin before coming in contact with cold temperatures or cold objects. When out in cold weather wear warm clothing and cover your mouth and nose to warm the air that goes into your lungs. Tell your care team if you get sensitive to the cold. Talk to your care team if you or your partner are pregnant or think either of you might be pregnant. This medication can cause serious birth defects if taken during pregnancy and for 9 months after the last dose. A negative pregnancy test is required before starting this medication. A reliable form of contraception is recommended while taking this medication and for 9 months after  the last dose. Talk to your care team about effective forms of contraception. Do not father a child while taking this medication and for 6 months after the last dose. Use a condom while having sex during this time period. Do not breastfeed while taking this medication and for 3 months after the last dose. This medication may cause infertility. Talk to your care team if you are concerned about your fertility. What side effects may I notice from receiving this medication? Side effects that you should report to  your care team as soon as possible: Allergic reactions--skin rash, itching, hives, swelling of the face, lips, tongue, or throat Bleeding--bloody or black, tar-like stools, vomiting blood or brown material that looks like coffee grounds, red or dark brown urine, small red or purple spots on skin, unusual bruising or bleeding Dry cough, shortness of breath or trouble breathing Heart rhythm changes--fast or irregular heartbeat, dizziness, feeling faint or lightheaded, chest pain, trouble breathing Infection--fever, chills, cough, sore throat, wounds that don't heal, pain or trouble when passing urine, general feeling of discomfort or being unwell Liver injury--right upper belly pain, loss of appetite, nausea, light-colored stool, dark yellow or brown urine, yellowing skin or eyes, unusual weakness or fatigue Low red blood cell level--unusual weakness or fatigue, dizziness, headache, trouble breathing Muscle injury--unusual weakness or fatigue, muscle pain, dark yellow or brown urine, decrease in amount of urine Pain, tingling, or numbness in the hands or feet Sudden and severe headache, confusion, change in vision, seizures, which may be signs of posterior reversible encephalopathy syndrome (PRES) Unusual bruising or bleeding Side effects that usually do not require medical attention (report to your care team if they continue or are bothersome): Diarrhea Nausea Pain, redness, or swelling with sores inside the mouth or throat Unusual weakness or fatigue Vomiting This list may not describe all possible side effects. Call your doctor for medical advice about side effects. You may report side effects to FDA at 1-800-FDA-1088. Where should I keep my medication? This medication is given in a hospital or clinic. It will not be stored at home. NOTE: This sheet is a summary. It may not cover all possible information. If you have questions about this medicine, talk to your doctor, pharmacist, or health care  provider.  2024 Elsevier/Gold Standard (2023-10-27 00:00:00)  Leucovorin Injection What is this medication? LEUCOVORIN (loo koe VOR in) prevents side effects from certain medications, such as methotrexate. It works by increasing folate levels. This helps protect healthy cells in your body. It may also be used to treat anemia caused by low levels of folate. It can also be used with fluorouracil, a type of chemotherapy, to treat colorectal cancer. It works by increasing the effects of fluorouracil in the body. This medicine may be used for other purposes; ask your health care provider or pharmacist if you have questions. What should I tell my care team before I take this medication? They need to know if you have any of these conditions: Anemia from low levels of vitamin B12 in the blood An unusual or allergic reaction to leucovorin, folic acid, other medications, foods, dyes, or preservatives Pregnant or trying to get pregnant Breastfeeding How should I use this medication? This medication is injected into a vein or a muscle. It is given by your care team in a hospital or clinic setting. Talk to your care team about the use of this medication in children. Special care may be needed. Overdosage: If you think you have taken too much of  this medicine contact a poison control center or emergency room at once. NOTE: This medicine is only for you. Do not share this medicine with others. What if I miss a dose? Keep appointments for follow-up doses. It is important not to miss your dose. Call your care team if you are unable to keep an appointment. What may interact with this medication? Capecitabine Fluorouracil Phenobarbital Phenytoin Primidone Trimethoprim;sulfamethoxazole This list may not describe all possible interactions. Give your health care provider a list of all the medicines, herbs, non-prescription drugs, or dietary supplements you use. Also tell them if you smoke, drink alcohol, or  use illegal drugs. Some items may interact with your medicine. What should I watch for while using this medication? Your condition will be monitored carefully while you are receiving this medication. This medication may increase the side effects of 5-fluorouracil. Tell your care team if you have diarrhea or mouth sores that do not get better or that get worse. What side effects may I notice from receiving this medication? Side effects that you should report to your care team as soon as possible: Allergic reactions--skin rash, itching, hives, swelling of the face, lips, tongue, or throat This list may not describe all possible side effects. Call your doctor for medical advice about side effects. You may report side effects to FDA at 1-800-FDA-1088. Where should I keep my medication? This medication is given in a hospital or clinic. It will not be stored at home. NOTE: This sheet is a summary. It may not cover all possible information. If you have questions about this medicine, talk to your doctor, pharmacist, or health care provider.  2024 Elsevier/Gold Standard (2022-04-19 00:00:00)  Fluorouracil Injection What is this medication? FLUOROURACIL (flure oh YOOR a sil) treats some types of cancer. It works by slowing down the growth of cancer cells. This medicine may be used for other purposes; ask your health care provider or pharmacist if you have questions. COMMON BRAND NAME(S): Adrucil What should I tell my care team before I take this medication? They need to know if you have any of these conditions: Blood disorders Dihydropyrimidine dehydrogenase (DPD) deficiency Infection, such as chickenpox, cold sores, herpes Kidney disease Liver disease Poor nutrition Recent or ongoing radiation therapy An unusual or allergic reaction to fluorouracil, other medications, foods, dyes, or preservatives If you or your partner are pregnant or trying to get pregnant Breast-feeding How should I use this  medication? This medication is injected into a vein. It is administered by your care team in a hospital or clinic setting. Talk to your care team about the use of this medication in children. Special care may be needed. Overdosage: If you think you have taken too much of this medicine contact a poison control center or emergency room at once. NOTE: This medicine is only for you. Do not share this medicine with others. What if I miss a dose? Keep appointments for follow-up doses. It is important not to miss your dose. Call your care team if you are unable to keep an appointment. What may interact with this medication? Do not take this medication with any of the following: Live virus vaccines This medication may also interact with the following: Medications that treat or prevent blood clots, such as warfarin, enoxaparin, dalteparin This list may not describe all possible interactions. Give your health care provider a list of all the medicines, herbs, non-prescription drugs, or dietary supplements you use. Also tell them if you smoke, drink alcohol, or use illegal  drugs. Some items may interact with your medicine. What should I watch for while using this medication? Your condition will be monitored carefully while you are receiving this medication. This medication may make you feel generally unwell. This is not uncommon as chemotherapy can affect healthy cells as well as cancer cells. Report any side effects. Continue your course of treatment even though you feel ill unless your care team tells you to stop. In some cases, you may be given additional medications to help with side effects. Follow all directions for their use. This medication may increase your risk of getting an infection. Call your care team for advice if you get a fever, chills, sore throat, or other symptoms of a cold or flu. Do not treat yourself. Try to avoid being around people who are sick. This medication may increase your risk  to bruise or bleed. Call your care team if you notice any unusual bleeding. Be careful brushing or flossing your teeth or using a toothpick because you may get an infection or bleed more easily. If you have any dental work done, tell your dentist you are receiving this medication. Avoid taking medications that contain aspirin, acetaminophen, ibuprofen, naproxen, or ketoprofen unless instructed by your care team. These medications may hide a fever. Do not treat diarrhea with over the counter products. Contact your care team if you have diarrhea that lasts more than 2 days or if it is severe and watery. This medication can make you more sensitive to the sun. Keep out of the sun. If you cannot avoid being in the sun, wear protective clothing and sunscreen. Do not use sun lamps, tanning beds, or tanning booths. Talk to your care team if you or your partner wish to become pregnant or think you might be pregnant. This medication can cause serious birth defects if taken during pregnancy and for 3 months after the last dose. A reliable form of contraception is recommended while taking this medication and for 3 months after the last dose. Talk to your care team about effective forms of contraception. Do not father a child while taking this medication and for 3 months after the last dose. Use a condom while having sex during this time period. Do not breastfeed while taking this medication. This medication may cause infertility. Talk to your care team if you are concerned about your fertility. What side effects may I notice from receiving this medication? Side effects that you should report to your care team as soon as possible: Allergic reactions--skin rash, itching, hives, swelling of the face, lips, tongue, or throat Heart attack--pain or tightness in the chest, shoulders, arms, or jaw, nausea, shortness of breath, cold or clammy skin, feeling faint or lightheaded Heart failure--shortness of breath, swelling of  the ankles, feet, or hands, sudden weight gain, unusual weakness or fatigue Heart rhythm changes--fast or irregular heartbeat, dizziness, feeling faint or lightheaded, chest pain, trouble breathing High ammonia level--unusual weakness or fatigue, confusion, loss of appetite, nausea, vomiting, seizures Infection--fever, chills, cough, sore throat, wounds that don't heal, pain or trouble when passing urine, general feeling of discomfort or being unwell Low red blood cell level--unusual weakness or fatigue, dizziness, headache, trouble breathing Pain, tingling, or numbness in the hands or feet, muscle weakness, change in vision, confusion or trouble speaking, loss of balance or coordination, trouble walking, seizures Redness, swelling, and blistering of the skin over hands and feet Severe or prolonged diarrhea Unusual bruising or bleeding Side effects that usually do not require medical  attention (report to your care team if they continue or are bothersome): Dry skin Headache Increased tears Nausea Pain, redness, or swelling with sores inside the mouth or throat Sensitivity to light Vomiting This list may not describe all possible side effects. Call your doctor for medical advice about side effects. You may report side effects to FDA at 1-800-FDA-1088. Where should I keep my medication? This medication is given in a hospital or clinic. It will not be stored at home. NOTE: This sheet is a summary. It may not cover all possible information. If you have questions about this medicine, talk to your doctor, pharmacist, or health care provider.  2024 Elsevier/Gold Standard (2022-03-22 00:00:00)

## 2024-01-09 ENCOUNTER — Other Ambulatory Visit: Payer: Self-pay

## 2024-01-10 ENCOUNTER — Inpatient Hospital Stay: Payer: Medicare PPO

## 2024-01-10 VITALS — BP 139/82 | HR 89 | Temp 97.7°F | Resp 20

## 2024-01-10 DIAGNOSIS — Z5111 Encounter for antineoplastic chemotherapy: Secondary | ICD-10-CM | POA: Diagnosis not present

## 2024-01-10 DIAGNOSIS — C186 Malignant neoplasm of descending colon: Secondary | ICD-10-CM

## 2024-01-10 MED ORDER — HEPARIN SOD (PORK) LOCK FLUSH 100 UNIT/ML IV SOLN
500.0000 [IU] | Freq: Once | INTRAVENOUS | Status: AC | PRN
  Administered 2024-01-10: 500 [IU]
  Filled 2024-01-10: qty 5

## 2024-01-10 MED ORDER — SODIUM CHLORIDE 0.9% FLUSH
10.0000 mL | INTRAVENOUS | Status: DC | PRN
  Administered 2024-01-10: 10 mL
  Filled 2024-01-10: qty 10

## 2024-01-22 ENCOUNTER — Inpatient Hospital Stay: Payer: Medicare PPO | Admitting: Internal Medicine

## 2024-01-22 ENCOUNTER — Inpatient Hospital Stay: Payer: Medicare PPO

## 2024-01-22 ENCOUNTER — Encounter: Payer: Self-pay | Admitting: Internal Medicine

## 2024-01-22 VITALS — BP 142/82 | HR 74 | Temp 97.4°F | Resp 14 | Wt 177.8 lb

## 2024-01-22 DIAGNOSIS — C186 Malignant neoplasm of descending colon: Secondary | ICD-10-CM | POA: Diagnosis not present

## 2024-01-22 DIAGNOSIS — Z5111 Encounter for antineoplastic chemotherapy: Secondary | ICD-10-CM | POA: Diagnosis not present

## 2024-01-22 DIAGNOSIS — R300 Dysuria: Secondary | ICD-10-CM

## 2024-01-22 LAB — CBC WITH DIFFERENTIAL (CANCER CENTER ONLY)
Abs Immature Granulocytes: 0.01 10*3/uL (ref 0.00–0.07)
Basophils Absolute: 0 10*3/uL (ref 0.0–0.1)
Basophils Relative: 1 %
Eosinophils Absolute: 0.2 10*3/uL (ref 0.0–0.5)
Eosinophils Relative: 4 %
HCT: 27.8 % — ABNORMAL LOW (ref 39.0–52.0)
Hemoglobin: 9.9 g/dL — ABNORMAL LOW (ref 13.0–17.0)
Immature Granulocytes: 0 %
Lymphocytes Relative: 28 %
Lymphs Abs: 1.4 10*3/uL (ref 0.7–4.0)
MCH: 35.1 pg — ABNORMAL HIGH (ref 26.0–34.0)
MCHC: 35.6 g/dL (ref 30.0–36.0)
MCV: 98.6 fL (ref 80.0–100.0)
Monocytes Absolute: 0.8 10*3/uL (ref 0.1–1.0)
Monocytes Relative: 16 %
Neutro Abs: 2.6 10*3/uL (ref 1.7–7.7)
Neutrophils Relative %: 51 %
Platelet Count: 127 10*3/uL — ABNORMAL LOW (ref 150–400)
RBC: 2.82 MIL/uL — ABNORMAL LOW (ref 4.22–5.81)
RDW: 15.3 % (ref 11.5–15.5)
WBC Count: 5.1 10*3/uL (ref 4.0–10.5)
nRBC: 0 % (ref 0.0–0.2)

## 2024-01-22 LAB — URINALYSIS, COMPLETE (UACMP) WITH MICROSCOPIC
Bacteria, UA: NONE SEEN
Bilirubin Urine: NEGATIVE
Glucose, UA: >=500 mg/dL — AB
Hgb urine dipstick: NEGATIVE
Ketones, ur: NEGATIVE mg/dL
Leukocytes,Ua: NEGATIVE
Nitrite: NEGATIVE
Protein, ur: NEGATIVE mg/dL
Specific Gravity, Urine: 1.013 (ref 1.005–1.030)
Squamous Epithelial / HPF: 0 /[HPF] (ref 0–5)
pH: 5 (ref 5.0–8.0)

## 2024-01-22 LAB — CMP (CANCER CENTER ONLY)
ALT: 15 U/L (ref 0–44)
AST: 20 U/L (ref 15–41)
Albumin: 3.4 g/dL — ABNORMAL LOW (ref 3.5–5.0)
Alkaline Phosphatase: 50 U/L (ref 38–126)
Anion gap: 9 (ref 5–15)
BUN: 19 mg/dL (ref 8–23)
CO2: 19 mmol/L — ABNORMAL LOW (ref 22–32)
Calcium: 8.9 mg/dL (ref 8.9–10.3)
Chloride: 109 mmol/L (ref 98–111)
Creatinine: 0.95 mg/dL (ref 0.61–1.24)
GFR, Estimated: >60 mL/min
Glucose, Bld: 191 mg/dL — ABNORMAL HIGH (ref 70–99)
Potassium: 3.4 mmol/L — ABNORMAL LOW (ref 3.5–5.1)
Sodium: 137 mmol/L (ref 135–145)
Total Bilirubin: 0.5 mg/dL (ref 0.0–1.2)
Total Protein: 6.3 g/dL — ABNORMAL LOW (ref 6.5–8.1)

## 2024-01-22 MED ORDER — LEUCOVORIN CALCIUM INJECTION 350 MG
400.0000 mg/m2 | Freq: Once | INTRAVENOUS | Status: AC
  Administered 2024-01-22: 796 mg via INTRAVENOUS
  Filled 2024-01-22: qty 39.8

## 2024-01-22 MED ORDER — DEXAMETHASONE SODIUM PHOSPHATE 10 MG/ML IJ SOLN
10.0000 mg | Freq: Once | INTRAMUSCULAR | Status: AC
Start: 2024-01-22 — End: 2024-01-22
  Administered 2024-01-22: 10 mg via INTRAVENOUS
  Filled 2024-01-22: qty 1

## 2024-01-22 MED ORDER — SODIUM CHLORIDE 0.9 % IV SOLN
2400.0000 mg/m2 | INTRAVENOUS | Status: DC
  Administered 2024-01-22: 5000 mg via INTRAVENOUS
  Filled 2024-01-22: qty 100

## 2024-01-22 MED ORDER — FLUOROURACIL CHEMO INJECTION 2.5 GM/50ML
400.0000 mg/m2 | Freq: Once | INTRAVENOUS | Status: AC
  Administered 2024-01-22: 800 mg via INTRAVENOUS
  Filled 2024-01-22: qty 16

## 2024-01-22 MED ORDER — PALONOSETRON HCL INJECTION 0.25 MG/5ML
0.2500 mg | Freq: Once | INTRAVENOUS | Status: AC
  Administered 2024-01-22: 0.25 mg via INTRAVENOUS
  Filled 2024-01-22: qty 5

## 2024-01-22 MED ORDER — OXALIPLATIN CHEMO INJECTION 100 MG/20ML
85.0000 mg/m2 | Freq: Once | INTRAVENOUS | Status: AC
  Administered 2024-01-22: 170 mg via INTRAVENOUS
  Filled 2024-01-22: qty 34

## 2024-01-22 MED ORDER — DEXTROSE 5 % IV SOLN
INTRAVENOUS | Status: DC
  Filled 2024-01-22: qty 250

## 2024-01-22 NOTE — Progress Notes (Unsigned)
 01/23/2024 2:21 PM   Colin Griffin August 27, 1951 [id number]  Referring provider: Ronnald Ramp, MD [address] [place],  [place] [address]  Urological history: 1. Prostate abscess -Associated with a large history of rectal abscess (02/2023)  2. 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  -Gold seeds placed 04/2023 -completed IMRT (07/2023)   No chief complaint on file.  HPI: Colin Griffin is a 73 y.o. male who presents today for burning with urination.   UA ***  PVR ***  Previous records reviewed.   PMH: Past Medical History:  Diagnosis Date   Cancer [facility]) April 2024   Constipation 03/04/2023   Diabetes mellitus without complication [facility]) March 2024    Surgical History: Past Surgical History:  Procedure Laterality Date   APPENDECTOMY     BIOPSY  06/29/2023   Procedure: BIOPSY;  Surgeon: Lemar Lofty., MD;  Location: [facility];  Service: Gastroenterology;;   COLONOSCOPY WITH PROPOFOL N/A 04/12/2023   Procedure: COLONOSCOPY WITH PROPOFOL;  Surgeon: [name], MD;  Location: [facility];  Service: Gastroenterology;  Laterality: N/A;   COLONOSCOPY WITH PROPOFOL N/A 06/29/2023   Procedure: COLONOSCOPY WITH PROPOFOL;  Surgeon: [name] Netty Starring., MD;  Location: [facility];  Service: Gastroenterology;  Laterality: N/A;   ENDOSCOPIC MUCOSAL RESECTION N/A 06/29/2023   Procedure: ENDOSCOPIC MUCOSAL RESECTION;  Surgeon: [name] Netty Starring., MD;  Location: [facility];  Service: Gastroenterology;  Laterality: N/A;   FLEXIBLE SIGMOIDOSCOPY N/A 09/04/2023   Procedure: FLEXIBLE SIGMOIDOSCOPY;  Surgeon: Andria Meuse, MD;  Location: [facility];  Service: General;  Laterality: N/A;   HEMOSTASIS CONTROL  06/29/2023   Procedure: HEMOSTASIS CONTROL;  Surgeon: Lemar Lofty., MD;  Location: [facility];   Service: Gastroenterology;;   IR IMAGING GUIDED PORT INSERTION  09/26/2023   IR RADIOLOGIST EVAL & MGMT  03/16/2023   PELVIC ABCESS DRAINAGE     March 2024   POLYPECTOMY  06/29/2023   Procedure: POLYPECTOMY;  Surgeon: Mansouraty, Netty Starring., MD;  Location: [facility];  Service: Gastroenterology;;   Sunnie Nielsen LIFTING INJECTION  06/29/2023   Procedure: SUBMUCOSAL LIFTING INJECTION;  Surgeon: Lemar Lofty., MD;  Location: [facility];  Service: Gastroenterology;;   SUBMUCOSAL TATTOO INJECTION  06/29/2023   Procedure: SUBMUCOSAL TATTOO INJECTION;  Surgeon: Lemar Lofty., MD;  Location: [facility];  Service: Gastroenterology;;   TRANSURETHRAL RESECTION OF PROSTATE N/A 03/03/2023   Procedure: TRANSURETHRAL RESECTION OF THE PROSTATE (TURP)/ UNROOFING OF PROSTATE ABSCESS;  Surgeon: Riki Altes, MD;  Location: [facility];  Service: Urology;  Laterality: N/A;    Home Medications:  Allergies as of 01/23/2024   Not on File      Medication List        Accurate as of January 22, 2024  2:21 PM. If you have any questions, ask your nurse or doctor.          albuterol 108 (90 Base) MCG/ACT inhaler Commonly known as: VENTOLIN HFA Inhale 2 puffs into the lungs every 6 (six) hours as needed for wheezing or shortness of breath.   FeroSul 325 (65 Fe) MG tablet Generic drug: ferrous sulfate Take 1 tablet (325 mg total) by mouth 2 (two) times daily with a meal.   FreeStyle Libre 3 Sensor Misc PLACE 1 SENSOR ON THE SKIN EVERY 14 DAYS. USE TO CHECK GLUCOSE CONTINUOUSLY   lidocaine-prilocaine cream Commonly known as: EMLA Apply 1 Application topically as needed.  metFORMIN 500 MG 24 hr tablet Commonly known as: GLUCOPHAGE-XR TAKE 1 TABLET BY MOUTH EVERY DAY WITH BREAKFAST   ondansetron 8 MG tablet Commonly known as: ZOFRAN One pill every 8 hours as needed for nausea/vomitting.   prochlorperazine 10 MG tablet Commonly known as: COMPAZINE Take 1 tablet (10 mg total)  by mouth every 6 (six) hours as needed for nausea or vomiting.   Trulicity 0.75 MG/0.5ML Soaj Generic drug: Dulaglutide INJECT 0.75 MG SUBCUTANEOUSLY ONE TIME PER WEEK        Allergies: Not on File  Family History: Family History  Problem Relation Age of Onset   Atrial fibrillation Mother    Skin cancer Father        non-melanoma   Colon cancer Sister 70       Lynch Syndrome   Breast cancer Sister 80   Diabetes Brother    Throat cancer Maternal Uncle    Breast cancer Paternal Aunt 38 - 36   Colon cancer Maternal Grandmother 39   Alcohol abuse Maternal Grandfather    Breast cancer Paternal Grandmother 102   Prostate cancer Paternal Grandfather 25 - 24   Other Niece        2 nieces have Lynch Syndrome (brother's daughters)   Diabetes Other    Ovarian cancer Other 52       paternal first cousin once removed   Colon polyps Neg Hx    Stomach cancer Neg Hx    Esophageal cancer Neg Hx    Inflammatory bowel disease Neg Hx    Liver disease Neg Hx    Pancreatic cancer Neg Hx     Social History:  reports that he has been smoking cigarettes. He has a 54 pack-year smoking history. He has never used smokeless tobacco. He reports that he does not currently use alcohol. He reports that he does not use drugs.  ROS: Pertinent ROS in HPI  Physical Exam: There were no vitals taken for this visit.  Constitutional:  Well nourished. Alert and oriented, No acute distress. HEENT: [place] AT, moist mucus membranes.  Trachea midline, no masses. Cardiovascular: No clubbing, cyanosis, or edema. Respiratory: Normal respiratory effort, no increased work of breathing. GI: Abdomen is soft, non tender, non distended, no abdominal masses. Liver and spleen not palpable.  No hernias appreciated.  Stool sample for occult testing is not indicated.   GU: No CVA tenderness.  No bladder fullness or masses.  Patient with circumcised/uncircumcised phallus. ***Foreskin easily retracted***  Urethral meatus is  patent.  No penile discharge. No penile lesions or rashes. Scrotum without lesions, cysts, rashes and/or edema.  Testicles are located scrotally bilaterally. No masses are appreciated in the testicles. Left and right epididymis are normal. Rectal: Patient with  normal sphincter tone. Anus and perineum without scarring or rashes. No rectal masses are appreciated. Prostate is approximately *** grams, *** nodules are appreciated. Seminal vesicles are normal. Skin: No rashes, bruises or suspicious lesions. Lymph: No cervical or inguinal adenopathy. Neurologic: Grossly intact, no focal deficits, moving all 4 extremities. Psychiatric: Normal mood and affect.  Laboratory Data: Lab Results  Component Value Date   WBC 5.1 01/22/2024   HGB 9.9 (L) 01/22/2024   HCT 27.8 (L) 01/22/2024   MCV 98.6 01/22/2024   PLT 127 (L) 01/22/2024   Lab Results  Component Value Date   CREATININE 0.95 01/22/2024   Lab Results  Component Value Date   HGBA1C 6.3 (H) 08/29/2023    Lab Results  Component Value Date  TSH 0.641 02/14/2023    Lab Results  Component Value Date   AST 20 01/22/2024   Lab Results  Component Value Date   ALT 15 01/22/2024   Urinalysis See EPIC and HPI  I have reviewed the labs.   Pertinent Imaging: ***  Assessment & Plan:  ***  1. Dysuria -UA grossly infected  -Urine culture pending -Started empirically on ***, will adjust if necessary once urine culture and sensitivity results are available    2. Prostate cancer -Has a follow-up scheduled with Dr. Aggie Cosier in June    No follow-ups on file.  These notes generated with voice recognition software. I apologize for typographical errors.  Cloretta Ned  [facility] [address]  [address] [place], [place] [address] [phone]

## 2024-01-22 NOTE — Progress Notes (Signed)
 Ongoing for several months now he is having pain when going to urinate but it also feels painful in his rectum as well. Needs a refill on his compazine. He has an appointment on March 4th for his bad cough he has had.

## 2024-01-22 NOTE — Assessment & Plan Note (Signed)
#   OCT 2024-Colon cancer, status post a partial left colectomy 09/04/2023, stage IIIa (pT2 pN1b)-negative resection margins, lymphovascular invasion present, 3/11 lymph nodes, 2 tumor deposits. [Drs.Sherrill; White;Masarouty]. mismatch repair protein expression intact. FOLFOX chemotherapy is given every 2 weeks  # Proceed with FOLFOX # 8 of planned 12.  Labs-CBC/chemistries were reviewed with the patient- Stable.   # Thrombocytopenia- > 100- sec to chemo- monitor ro for now. Stable.   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 Stable.   # COPD/URI- s/p agumentin- s/p clairtin-D OTC.ok to proceed with chemo. Recommend albuterol prn; HOLD steroids given DM.Awaiting evaluation next week.   # 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 involving 5-10% of submitted tissue from a TUR specimen 03/03/2023; s/p EBRT- IMRT 20 6/24 - 07/20/2023- JAN 2024- PSA- WNL- worsening urinary symptoms- recommend re-evaluation with Urology  # Diabetes- Trulicity/metformin- CGM-monitor closely on chemotherapy/steroids- stable  # IDA- Hb 9-10- ferritin- on PO iron; hold off venofer for now. NOv 2024- ferritni-98; sat- 30%- stable.   # Acitive smoker: Recommend quitting smoking.  Continue lung cancer screening-   # IV access port   # DISPOSITION:  # order today UA/culture # FOLFOX-chemo today; - d-3 pump off;   # follow up in 2 weeks- MD; port/labs- cbc/cmp; FOLFOX; d-3 pump off;    # follow up in 4  weeks- MD; port/labs-- cbc/cmp; FOLFOX;possible   d-3 pump off;  Dr.B

## 2024-01-22 NOTE — Progress Notes (Signed)
 [facility] CONSULT NOTE  Patient Care Team: Ronnald Ramp, MD as PCP - General (Family Medicine) Benita Gutter, RN as Oncology Nurse Navigator Earna Coder, MD as Consulting Physician (Oncology)  CHIEF COMPLAINTS/PURPOSE OF CONSULTATION: Colon cancer  Oncology History Overview Note     Colon cancer, status post a partial left colectomy 09/04/2023, stage IIIa (pT2 pN1b) CT abdomen/pelvis 03/01/2023-prostate/pelvic abscess, large amount of colonic stool CT chest 03/30/2023-bilateral solid pulmonary nodules-likely benign, 46-month follow-up CT recommended Colonoscopy 04/12/2023-multiple polyps removed, greater than 50 mm polyp in the distal ascending colon and sigmoid colon not removed Colonoscopy 06/29/2023 greater than 50 mm polyps removed from the ascending, transverse, and descending colon-tubular adenomas, sigmoid lesion at 35 cm incompletely resected with biopsy revealing an ulcerated tubular adenoma with at least high-grade dysplasia and suspicious for invasive or [place] Descending colon resection 09/04/2023, pT2, PN 1B moderately differentiated adenocarcinoma, negative resection margins, lymphovascular invasion present, 3/11 lymph nodes, 2 tumor deposits, mismatch repair protein expression intact, MSS  Multiple colon polyps on colonoscopy 04/12/2023 and 06/29/2023-tubular adenomas Prostate cancer-Gleason 7 acinar adenocarcinoma involving 5-10% of submitted tissue from a TUR specimen 03/03/2023 Prostate 03/20/2023-mild prostamegaly- PI-RADS category 5 lesion in the left peripheral zone and category 4 lesion in the left anterior peripheral zone and left anterior fibromuscular stroma, reduced size of prostate abscess with continued surrounding prostatitis and periprostatic stranding IMRT 20 6/24 - 07/20/2023  4.  Diabetes 5.  History of colon cancer-negative CancerNext expanded panel 08/29/2023 6.  History of lung nodules, ongoing tobacco use, followed in the lung  cancer screening clinic# prostate/pelvic abscess.  He was admitted for antibiotics and drainage of the pelvic abscess 03/01/2023. A TUR and was found to have a Gleason 7 prostate cancer involving 5-10% of the submitted tissue.  He was referred to Dr. Rushie Chestnut and completed external beam radiation to the prostate.  # MAY 2024 [screening colo]- A greater than 50 mm polyp was removed from the proximal ascending colon.  Additional greater than 50 m polyps were not removed from the distal ascending and sigmoid colon.  Additional smaller polyps were removed from the colon. The pathology revealed multiple fragments of tubular adenomas. Dr. Baird Lyons than 50 mm polyps were removed from the ascending, transverse, and descending colon.  A polypoid lesion at 35 cm was incompletely resected and tattooed.  The pathology from multiple polyps returned as tubular adenomas without high-grade dysplasia or carcinoma.  # OCTOBER 2024- STAGE III SIGMOID COLON CANCER [Dr.Sherril/Dr.White- [place]] pathology from the sigmoid resection returned as invasive moderately differentiated adenocarcinoma involving the descending colon.  Carcinoma invades into but not through the muscularis propria.  The resection margins are negative.  Lymphovascular invasion is present.  Metastatic carcinoma was identified in 3 of 11 lymph nodes and there were 2 tumor deposits.  Mismatch repair protein expression is intact.   # Multiple colon polyps on colonoscopy 04/12/2023 and 06/29/2023-tubular adenomas- s/p  Genetics counseling: negative CancerNext expanded panel 08/29/2023  # NOV 4th, 2024- FOLFOX q 2 w x 6 months.   # APRIL 2024-  Prostate cancer [s/p EBRT- Dr.Chrystal]-    Cancer of descending colon (HCC)  09/19/2023 Initial Diagnosis   Cancer of descending colon (HCC)   09/19/2023 Cancer Staging   Staging form: Colon and Rectum, AJCC 8th Edition - Pathologic: pT2, cM0 - Signed by Ladene Artist, MD on 09/19/2023 Histologic grading  system: 4 grade system Histologic grade (G): G2 Residual tumor (R): R0 - None Laterality: Left Lymph-vascular invasion (LVI): LVI  present/identified, NOS Tumor deposits (TD): Present Perineural invasion (PNI): Absent Microsatellite instability (MSI): Stable   09/19/2023 Cancer Staging   Staging form: Colon and Rectum, AJCC 8th Edition - Pathologic: Stage IIIA (pT2, pN1b, cM0) - Signed by Ladene Artist, MD on 09/19/2023   10/11/2023 -  Chemotherapy   Patient is on Treatment Plan : COLORECTAL FOLFOX q14d x 6 months      HISTORY OF PRESENTING ILLNESS: Patient ambulating-independently.  Alone.  Colin Griffin 73 y.o.  male pleasant patient with  stage III colon cancer s/p left hemicolectomy currently on adjuvant chemotherapy with FOLFOX is here for a follow up.  Patient also have increased frequency of urination.  Also dysuria.  No fever no chills.   Also complains of ongoing cough.awaiting evaluation pulmonary next week.  Mild tingling and numbness of the chemotherapy.  Otherwise currently resolved.  Denies any blood in stools or black-colored stools.  No nausea no vomiting.   Review of Systems  Constitutional:  Negative for chills, diaphoresis, fever, malaise/fatigue and weight loss.  HENT:  Negative for nosebleeds and sore throat.   Eyes:  Negative for double vision.  Respiratory:  Positive for cough. Negative for hemoptysis, sputum production, shortness of breath and wheezing.   Cardiovascular:  Negative for chest pain, palpitations, orthopnea and leg swelling.  Gastrointestinal:  Positive for nausea. Negative for abdominal pain, blood in stool, constipation, diarrhea, heartburn, melena and vomiting.  Genitourinary:  Negative for dysuria, frequency and urgency.  Musculoskeletal:  Negative for back pain and joint pain.  Skin: Negative.  Negative for itching and rash.  Neurological:  Negative for dizziness, tingling, focal weakness, weakness and headaches.   Endo/Heme/Allergies:  Does not bruise/bleed easily.  Psychiatric/Behavioral:  Negative for depression. The patient is not nervous/anxious and does not have insomnia.     MEDICAL HISTORY:  Past Medical History:  Diagnosis Date   Cancer [facility]) April 2024   Constipation 03/04/2023   Diabetes mellitus without complication [facility]) March 2024    SURGICAL HISTORY: Past Surgical History:  Procedure Laterality Date   APPENDECTOMY     BIOPSY  06/29/2023   Procedure: BIOPSY;  Surgeon: Lemar Lofty., MD;  Location: [facility];  Service: Gastroenterology;;   COLONOSCOPY WITH PROPOFOL N/A 04/12/2023   Procedure: COLONOSCOPY WITH PROPOFOL;  Surgeon: [name], MD;  Location: [facility];  Service: Gastroenterology;  Laterality: N/A;   COLONOSCOPY WITH PROPOFOL N/A 06/29/2023   Procedure: COLONOSCOPY WITH PROPOFOL;  Surgeon: [name] Netty Starring., MD;  Location: [facility];  Service: Gastroenterology;  Laterality: N/A;   ENDOSCOPIC MUCOSAL RESECTION N/A 06/29/2023   Procedure: ENDOSCOPIC MUCOSAL RESECTION;  Surgeon: [name] Netty Starring., MD;  Location: [facility];  Service: Gastroenterology;  Laterality: N/A;   FLEXIBLE SIGMOIDOSCOPY N/A 09/04/2023   Procedure: FLEXIBLE SIGMOIDOSCOPY;  Surgeon: Andria Meuse, MD;  Location: [facility];  Service: General;  Laterality: N/A;   HEMOSTASIS CONTROL  06/29/2023   Procedure: HEMOSTASIS CONTROL;  Surgeon: Lemar Lofty., MD;  Location: [facility];  Service: Gastroenterology;;   IR IMAGING GUIDED PORT INSERTION  09/26/2023   IR RADIOLOGIST EVAL & MGMT  03/16/2023   PELVIC ABCESS DRAINAGE     March 2024   POLYPECTOMY  06/29/2023   Procedure: POLYPECTOMY;  Surgeon: [name] Netty Starring., MD;  Location: [facility];  Service: Gastroenterology;;   Sunnie Nielsen LIFTING INJECTION  06/29/2023   Procedure: SUBMUCOSAL LIFTING INJECTION;  Surgeon: Lemar Lofty., MD;  Location: [facility];  Service: Gastroenterology;;    SUBMUCOSAL TATTOO INJECTION  06/29/2023   Procedure: SUBMUCOSAL TATTOO INJECTION;  Surgeon: Lemar Lofty., MD;  Location: [facility];  Service: Gastroenterology;;   TRANSURETHRAL RESECTION OF PROSTATE N/A 03/03/2023   Procedure: TRANSURETHRAL RESECTION OF THE PROSTATE (TURP)/ UNROOFING OF PROSTATE ABSCESS;  Surgeon: Riki Altes, MD;  Location: [facility];  Service: Urology;  Laterality: N/A;    SOCIAL HISTORY: Social History   Socioeconomic History   Marital status: Married    Spouse name: Editor, commissioning   Number of children: 1   Years of education: Not on file   Highest education level: Master's degree (e.g., MA, MS, MEng, MEd, MSW, MBA)  Occupational History   Not on file  Tobacco Use   Smoking status: Every Day    Current packs/day: 1.00    Average packs/day: 1 pack/day for 54.0 years (54.0 ttl pk-yrs)    Types: Cigarettes   Smokeless tobacco: Never   Tobacco comments:    Reports that at one time he smoked 3 packs per day while in the [employer]  Vaping Use   Vaping status: Every Day   Start date: 08/08/2022   Substances: Flavoring  Substance and Sexual Activity   Alcohol use: Not Currently   Drug use: Never   Sexual activity: Not Currently    Birth control/protection: None  Other Topics Concern   Not on file  Social History Narrative   ** Merged History Encounter **       Retired from Holiday representative for 30 years    Social Drivers of Corporate investment banker Strain: Low Risk  (05/01/2023)   Overall Financial Resource Strain (CARDIA)    Difficulty of Paying Living Expenses: Not hard at all  Food Insecurity: No Food Insecurity (09/19/2023)   Hunger Vital Sign    Worried About Running Out of Food in the Last Year: Never true    Ran Out of Food in the Last Year: Never true  Transportation Needs: No Transportation Needs (09/19/2023)   PRAPARE - Administrator, Civil Service (Medical): No    Lack of Transportation (Non-Medical): No   Physical Activity: Sufficiently Active (05/01/2023)   Exercise Vital Sign    Days of Exercise per Week: 6 days    Minutes of Exercise per Session: 150+ min  Stress: No Stress Concern Present (05/01/2023)   [employer] of Occupational Health - Occupational Stress Questionnaire    Feeling of Stress : Not at all  Social Connections: Moderately Integrated (05/01/2023)   Social Connection and Isolation Panel [NHANES]    Frequency of Communication with Friends and Family: Three times a week    Frequency of Social Gatherings with Friends and Family: Twice a week    Attends Religious Services: More than 4 times per year    Active Member of [employer] or Organizations: No    Attends Banker Meetings: Patient declined    Marital Status: Married  Catering manager Violence: Not At Risk (09/19/2023)   Humiliation, Afraid, Rape, and Kick questionnaire    Fear of Current or Ex-Partner: No    Emotionally Abused: No    Physically Abused: No    Sexually Abused: No    FAMILY HISTORY: Family History  Problem Relation Age of Onset   Atrial fibrillation Mother    Skin cancer Father        non-melanoma   Colon cancer Sister 56       Lynch Syndrome   Breast cancer Sister 35   Diabetes Brother  Throat cancer Maternal Uncle    Breast cancer Paternal Aunt 32 - 36   Colon cancer Maternal Grandmother 19   Alcohol abuse Maternal Grandfather    Breast cancer Paternal Grandmother 90   Prostate cancer Paternal Grandfather 30 - 35   Other Niece        2 nieces have Lynch Syndrome (brother's daughters)   Diabetes Other    Ovarian cancer Other 82       paternal first cousin once removed   Colon polyps Neg Hx    Stomach cancer Neg Hx    Esophageal cancer Neg Hx    Inflammatory bowel disease Neg Hx    Liver disease Neg Hx    Pancreatic cancer Neg Hx     ALLERGIES:  has no allergies on file.  MEDICATIONS:  Current Outpatient Medications  Medication Sig Dispense Refill   albuterol  (VENTOLIN HFA) 108 (90 Base) MCG/ACT inhaler Inhale 2 puffs into the lungs every 6 (six) hours as needed for wheezing or shortness of breath. 8 g 2   Continuous Glucose Sensor (FREESTYLE LIBRE 3 SENSOR) MISC PLACE 1 SENSOR ON THE SKIN EVERY 14 DAYS. USE TO CHECK GLUCOSE CONTINUOUSLY 2 each 6   Dulaglutide (TRULICITY) 0.75 MG/0.5ML SOAJ INJECT 0.75 MG SUBCUTANEOUSLY ONE TIME PER WEEK 6 mL 3   ferrous sulfate 325 (65 FE) MG tablet Take 1 tablet (325 mg total) by mouth 2 (two) times daily with a meal. 60 tablet 0   lidocaine-prilocaine (EMLA) cream Apply 1 Application topically as needed. 30 g 1   metFORMIN (GLUCOPHAGE-XR) 500 MG 24 hr tablet TAKE 1 TABLET BY MOUTH EVERY DAY WITH BREAKFAST 90 tablet 1   ondansetron (ZOFRAN) 8 MG tablet One pill every 8 hours as needed for nausea/vomitting. 40 tablet 1   prochlorperazine (COMPAZINE) 10 MG tablet Take 1 tablet (10 mg total) by mouth every 6 (six) hours as needed for nausea or vomiting. 40 tablet 1   No current facility-administered medications for this visit.   Facility-Administered Medications Ordered in Other Visits  Medication Dose Route Frequency Provider Last Rate Last Admin   dexamethasone (DECADRON) injection 10 mg  10 mg Intravenous Once Earna Coder, MD       dextrose 5 % solution   Intravenous Continuous Earna Coder, MD 10 mL/hr at 01/22/24 0935 New Bag at 01/22/24 0935   fluorouracil (ADRUCIL) 5,000 mg in sodium chloride 0.9 % 150 mL chemo infusion  2,400 mg/m2 (Treatment Plan Recorded) Intravenous 1 day or 1 dose Earna Coder, MD       fluorouracil (ADRUCIL) chemo injection 800 mg  400 mg/m2 (Treatment Plan Recorded) Intravenous Once Earna Coder, MD       leucovorin 796 mg in dextrose 5 % 250 mL infusion  400 mg/m2 (Treatment Plan Recorded) Intravenous Once Earna Coder, MD       oxaliplatin (ELOXATIN) 170 mg in dextrose 5 % 500 mL chemo infusion  85 mg/m2 (Treatment Plan Recorded) Intravenous  Once Earna Coder, MD        PHYSICAL EXAMINATION:   Vitals:   01/22/24 0841  BP: (!) 142/82  Pulse: 74  Resp: 14  Temp: (!) 97.4 F (36.3 C)  SpO2: 96%   Filed Weights   01/22/24 0841  Weight: 177 lb 12.8 oz (80.6 kg)    Physical Exam Vitals and nursing note reviewed.  HENT:     Head: Normocephalic and atraumatic.     Mouth/Throat:  Pharynx: Oropharynx is clear.  Eyes:     Extraocular Movements: Extraocular movements intact.     Pupils: Pupils are equal, round, and reactive to light.  Cardiovascular:     Rate and Rhythm: Normal rate and regular rhythm.  Pulmonary:     Comments: Decreased breath sounds bilaterally.  Abdominal:     Palpations: Abdomen is soft.  Musculoskeletal:        General: Normal range of motion.     Cervical back: Normal range of motion.  Skin:    General: Skin is warm.  Neurological:     General: No focal deficit present.     Mental Status: He is alert and oriented to person, place, and time.  Psychiatric:        Behavior: Behavior normal.        Judgment: Judgment normal.     LABORATORY DATA:  I have reviewed the data as listed Lab Results  Component Value Date   WBC 5.1 01/22/2024   HGB 9.9 (L) 01/22/2024   HCT 27.8 (L) 01/22/2024   MCV 98.6 01/22/2024   PLT 127 (L) 01/22/2024   Recent Labs    03/01/23 1954 03/02/23 0504 12/25/23 0830 01/08/24 0857 01/22/24 0821  NA  --    < > 137 136 137  K  --    < > 3.8 3.7 3.4*  CL  --    < > 108 108 109  CO2  --    < > 23 21* 19*  GLUCOSE  --    < > 190* 191* 191*  BUN  --    < > 20 27* 19  CREATININE  --    < > 0.98 0.94 0.95  CALCIUM  --    < > 8.9 8.9 8.9  GFRNONAA  --    < > >60 >60 >60  PROT 8.4*   < > 6.3* 6.5 6.3*  ALBUMIN 3.2*   < > 3.5 3.7 3.4*  AST 40   < > 29 20 20   ALT 48*   < > 27 16 15   ALKPHOS 186*   < > 56 56 50  BILITOT 0.8   < > 0.5 0.6 0.5  BILIDIR 0.2  --   --   --   --   IBILI 0.6  --   --   --   --    < > = values in this interval not  displayed.    RADIOGRAPHIC STUDIES: I have personally reviewed the radiological images as listed and agreed with the findings in the report. No results found.   Cancer of descending colon (HCC) # OCT 2024-Colon cancer, status post a partial left colectomy 09/04/2023, stage IIIa (pT2 pN1b)-negative resection margins, lymphovascular invasion present, 3/11 lymph nodes, 2 tumor deposits. [Drs.Sherrill; White;Masarouty]. mismatch repair protein expression intact. FOLFOX chemotherapy is given every 2 weeks  # Proceed with FOLFOX # 8 of planned 12.  Labs-CBC/chemistries were reviewed with the patient- Stable.   # Thrombocytopenia- > 100- sec to chemo- monitor ro for now. Stable.   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 Stable.   # COPD/URI- s/p agumentin- s/p clairtin-D OTC.ok to proceed with chemo. Recommend albuterol prn; HOLD steroids given DM.Awaiting evaluation next week.   # 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 involving 5-10% of submitted tissue from a TUR specimen 03/03/2023; s/p EBRT- IMRT 20 6/24 - 07/20/2023- JAN 2024- PSA- WNL- worsening urinary symptoms- recommend re-evaluation with Urology  # Diabetes- Trulicity/metformin-  CGM-monitor closely on chemotherapy/steroids- stable  # IDA- Hb 9-10- ferritin- on PO iron; hold off venofer for now. NOv 2024- ferritni-98; sat- 30%- stable.   # Acitive smoker: Recommend quitting smoking.  Continue lung cancer screening-   # IV access port   # DISPOSITION:  # order today UA/culture # FOLFOX-chemo today; - d-3 pump off;   # follow up in 2 weeks- MD; port/labs- cbc/cmp; FOLFOX; d-3 pump off;    # follow up in 4  weeks- MD; port/labs-- cbc/cmp; FOLFOX;possible   d-3 pump off;  Dr.B   Above plan of care was discussed with patient/family in detail.  My contact information was given to the patient/family.     Earna Coder, MD 01/22/2024 9:39 AM

## 2024-01-23 ENCOUNTER — Encounter: Payer: Self-pay | Admitting: Urology

## 2024-01-23 ENCOUNTER — Ambulatory Visit: Payer: Medicare PPO | Admitting: Urology

## 2024-01-23 VITALS — BP 123/73 | HR 96 | Ht 72.0 in | Wt 176.0 lb

## 2024-01-23 DIAGNOSIS — C61 Malignant neoplasm of prostate: Secondary | ICD-10-CM

## 2024-01-23 DIAGNOSIS — R300 Dysuria: Secondary | ICD-10-CM

## 2024-01-23 DIAGNOSIS — R3915 Urgency of urination: Secondary | ICD-10-CM | POA: Diagnosis not present

## 2024-01-23 LAB — URINE CULTURE

## 2024-01-23 LAB — BLADDER SCAN AMB NON-IMAGING
Scan Result: 111
Scan Result: 0

## 2024-01-23 MED ORDER — GEMTESA 75 MG PO TABS: 75.0000 mg | ORAL_TABLET | Freq: Every day | ORAL | Status: DC

## 2024-01-24 ENCOUNTER — Other Ambulatory Visit: Payer: Medicare PPO

## 2024-01-24 ENCOUNTER — Telehealth: Payer: Self-pay | Admitting: Urology

## 2024-01-24 ENCOUNTER — Inpatient Hospital Stay: Payer: Medicare PPO

## 2024-01-24 VITALS — BP 130/80 | HR 90 | Temp 98.0°F | Resp 18

## 2024-01-24 DIAGNOSIS — Z5111 Encounter for antineoplastic chemotherapy: Secondary | ICD-10-CM | POA: Diagnosis not present

## 2024-01-24 DIAGNOSIS — R3915 Urgency of urination: Secondary | ICD-10-CM

## 2024-01-24 DIAGNOSIS — C186 Malignant neoplasm of descending colon: Secondary | ICD-10-CM

## 2024-01-24 LAB — URINALYSIS, COMPLETE
Bilirubin, UA: NEGATIVE
Ketones, UA: NEGATIVE
Leukocytes,UA: NEGATIVE
Nitrite, UA: NEGATIVE
Specific Gravity, UA: >1.03 — ABNORMAL HIGH (ref 1.005–1.030)
Urobilinogen, Ur: 0.2 mg/dL (ref 0.2–1.0)
pH, UA: 5.5 (ref 5.0–7.5)

## 2024-01-24 LAB — MICROSCOPIC EXAMINATION

## 2024-01-24 MED ORDER — HEPARIN SOD (PORK) LOCK FLUSH 100 UNIT/ML IV SOLN
500.0000 [IU] | Freq: Once | INTRAVENOUS | Status: AC | PRN
  Administered 2024-01-24: 500 [IU]
  Filled 2024-01-24: qty 5

## 2024-01-24 MED ORDER — SODIUM CHLORIDE 0.9% FLUSH
10.0000 mL | INTRAVENOUS | Status: DC | PRN
Start: 2024-01-24 — End: 2024-01-24
  Administered 2024-01-24: 10 mL
  Filled 2024-01-24: qty 10

## 2024-01-24 NOTE — Telephone Encounter (Signed)
 He brought [place] a specimen from home.  We cannot uses because this is why it was contaminated with the previous culture.  We need to get a clean-catch specimen here in the office.

## 2024-01-28 LAB — CULTURE, URINE COMPREHENSIVE

## 2024-01-29 MED ORDER — CIPROFLOXACIN HCL 250 MG PO TABS: 250.0000 mg | ORAL_TABLET | Freq: Two times a day (BID) | ORAL | 0 refills | Status: DC

## 2024-01-30 ENCOUNTER — Ambulatory Visit: Payer: Medicare PPO | Admitting: Internal Medicine

## 2024-01-30 ENCOUNTER — Encounter: Payer: Self-pay | Admitting: Internal Medicine

## 2024-01-30 DIAGNOSIS — J439 Emphysema, unspecified: Secondary | ICD-10-CM | POA: Diagnosis not present

## 2024-01-30 DIAGNOSIS — J479 Bronchiectasis, uncomplicated: Secondary | ICD-10-CM

## 2024-01-30 DIAGNOSIS — F17210 Nicotine dependence, cigarettes, uncomplicated: Secondary | ICD-10-CM

## 2024-01-30 DIAGNOSIS — J849 Interstitial pulmonary disease, unspecified: Secondary | ICD-10-CM | POA: Diagnosis not present

## 2024-01-30 DIAGNOSIS — J449 Chronic obstructive pulmonary disease, unspecified: Secondary | ICD-10-CM

## 2024-01-30 DIAGNOSIS — R9389 Abnormal findings on diagnostic imaging of other specified body structures: Secondary | ICD-10-CM

## 2024-01-30 MED ORDER — TRELEGY ELLIPTA 200-62.5-25 MCG/ACT IN AEPB: 1.0000 | INHALATION_SPRAY | Freq: Every day | RESPIRATORY_TRACT | Status: DC

## 2024-01-30 MED ORDER — PREDNISONE 20 MG PO TABS: 20.0000 mg | ORAL_TABLET | Freq: Every day | ORAL | 1 refills | Status: DC

## 2024-01-30 MED ORDER — TRELEGY ELLIPTA 200-62.5-25 MCG/ACT IN AEPB: 1.0000 | INHALATION_SPRAY | Freq: Every day | RESPIRATORY_TRACT | 6 refills | Status: AC

## 2024-01-30 NOTE — Patient Instructions (Addendum)
 Recommend stop smoking  Start Trelegy 201 puff daily Rinse mouth after every use  He will need to assess for oxygen levels when you are sleeping Check overnight pulse oximetry  We will obtain pulmonary function test to assess lung function  Prednisone 20 mg daily for 7 days Continue the antibiotics you are on  Avoid Allergens and Irritants Avoid secondhand smoke Avoid SICK contacts Recommend  Masking  when appropriate Recommend Keep up-to-date with vaccinations  Follow-up lung cancer screening protocol

## 2024-01-30 NOTE — Progress Notes (Signed)
 [facility] Consultation      Date: 01/30/2024,   MRN# [id number] Arieh Bogue [birth year]    CHIEF COMPLAINT:   Assessment for COPD   HISTORY OF PRESENT ILLNESS   73 year old pleasant white male seen today for ongoing symptoms of shortness of breath increased cough and wheezing Patient smokes 1 pack a day for the last 54 years He has no reason to quit Patient has increased work of breathing and shortness of breath with exertion Also associated with productive cough with wheezing This has been going on for last couple of months  Patient currently on Cipro for bladder infection Patient has history of prostate cancer Patient has a history of colon cancer  CT of the chest reviewed with patient in detail 01/30/2024 Patient is enrolled in lung cancer screening program Patient does have interstitial lung disease as well as areas of emphysema I will need pulmonary function test to assess lung function   I have explained we will need to assess his oxygen levels with exertion and also assess oxygen levels at nighttime Only inhaler at this time is albuterol and it does help with symptoms I have explained to patient we will try maintenance therapy with traditional inhaler therapy with Trelegy   No exacerbation at this time No evidence of heart failure at this time No evidence or signs of infection at this time No respiratory distress No fevers, chills, nausea, vomiting, diarrhea No evidence of lower extremity edema No evidence hemoptysis  Ambulating pulse oximetry in the office did not reveal any significant hypoxia   Oncological history reviewed Cancer of descending colon (HCC) # OCT 2024-Colon cancer, status post a partial left colectomy 09/04/2023, stage IIIa   # 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 involving 5-10% of submitted tissue from a TUR specimen 03/03/2023  History of Lung Nodules CME   PAST MEDICAL HISTORY   Past  Medical History:  Diagnosis Date   Cancer [facility]) April 2024   Constipation 03/04/2023   Diabetes mellitus without complication [facility]) March 2024     SURGICAL HISTORY   Past Surgical History:  Procedure Laterality Date   APPENDECTOMY     BIOPSY  06/29/2023   Procedure: BIOPSY;  Surgeon: Lemar Lofty., MD;  Location: [facility];  Service: Gastroenterology;;   COLONOSCOPY WITH PROPOFOL N/A 04/12/2023   Procedure: COLONOSCOPY WITH PROPOFOL;  Surgeon: [name], MD;  Location: [facility];  Service: Gastroenterology;  Laterality: N/A;   COLONOSCOPY WITH PROPOFOL N/A 06/29/2023   Procedure: COLONOSCOPY WITH PROPOFOL;  Surgeon: [name] Netty Starring., MD;  Location: [facility];  Service: Gastroenterology;  Laterality: N/A;   ENDOSCOPIC MUCOSAL RESECTION N/A 06/29/2023   Procedure: ENDOSCOPIC MUCOSAL RESECTION;  Surgeon: [name] Netty Starring., MD;  Location: [facility];  Service: Gastroenterology;  Laterality: N/A;   FLEXIBLE SIGMOIDOSCOPY N/A 09/04/2023   Procedure: FLEXIBLE SIGMOIDOSCOPY;  Surgeon: Andria Meuse, MD;  Location: [facility];  Service: General;  Laterality: N/A;   HEMOSTASIS CONTROL  06/29/2023   Procedure: HEMOSTASIS CONTROL;  Surgeon: Lemar Lofty., MD;  Location: [facility];  Service: Gastroenterology;;   IR IMAGING GUIDED PORT INSERTION  09/26/2023   IR RADIOLOGIST EVAL & MGMT  03/16/2023   PELVIC ABCESS DRAINAGE     March 2024   POLYPECTOMY  06/29/2023   Procedure: POLYPECTOMY;  Surgeon: [name] Netty Starring., MD;  Location: [facility];  Service: Gastroenterology;;   SUBMUCOSAL LIFTING INJECTION  06/29/2023   Procedure: SUBMUCOSAL LIFTING INJECTION;  Surgeon: Lemar Lofty., MD;  Location: [facility];  Service: Gastroenterology;;   SUBMUCOSAL TATTOO INJECTION  06/29/2023   Procedure: SUBMUCOSAL TATTOO INJECTION;  Surgeon: Lemar Lofty., MD;  Location: [facility];  Service: Gastroenterology;;   TRANSURETHRAL  RESECTION OF PROSTATE N/A 03/03/2023   Procedure: TRANSURETHRAL RESECTION OF THE PROSTATE (TURP)/ UNROOFING OF PROSTATE ABSCESS;  Surgeon: Riki Altes, MD;  Location: [facility];  Service: Urology;  Laterality: N/A;     FAMILY HISTORY   Family History  Problem Relation Age of Onset   Atrial fibrillation Mother    Skin cancer Father        non-melanoma   Colon cancer Sister 77       Lynch Syndrome   Breast cancer Sister 57   Diabetes Brother    Throat cancer Maternal Uncle    Breast cancer Paternal Aunt 7 - 36   Colon cancer Maternal Grandmother 40   Alcohol abuse Maternal Grandfather    Breast cancer Paternal Grandmother 55   Prostate cancer Paternal Grandfather 32 - 63   Other Niece        2 nieces have Lynch Syndrome (brother's daughters)   Diabetes Other    Ovarian cancer Other 96       paternal first cousin once removed   Colon polyps Neg Hx    Stomach cancer Neg Hx    Esophageal cancer Neg Hx    Inflammatory bowel disease Neg Hx    Liver disease Neg Hx    Pancreatic cancer Neg Hx      SOCIAL HISTORY   Social History   Tobacco Use   Smoking status: Every Day    Current packs/day: 1.00    Average packs/day: 1 pack/day for 54.0 years (54.0 ttl pk-yrs)    Types: Cigarettes   Smokeless tobacco: Never   Tobacco comments:    Reports that at one time he smoked 3 packs per day while in the [employer]  Vaping Use   Vaping status: Every Day   Start date: 08/08/2022   Substances: Flavoring  Substance Use Topics   Alcohol use: Not Currently   Drug use: Never     MEDICATIONS    Home Medication:  Current Outpatient Rx   Order #: [id number] Class: Normal   Order #: [id number] Class: Normal   Order #: [id number] Class: Normal   Order #: [id number] Class: Normal   Order #: [id number] Class: Normal   Order #: [id number] Class: Normal   Order #: [id number] Class: Normal   Order #: [id number] Class: Normal   Order #: [id number] Class: Sample    Current Medication:  Current  Outpatient Medications:    albuterol (VENTOLIN HFA) 108 (90 Base) MCG/ACT inhaler, Inhale 2 puffs into the lungs every 6 (six) hours as needed for wheezing or shortness of breath., Disp: 8 g, Rfl: 2   ciprofloxacin (CIPRO) 250 MG tablet, Take 1 tablet (250 mg total) by mouth 2 (two) times daily., Disp: 14 tablet, Rfl: 0   Continuous Glucose Sensor (FREESTYLE LIBRE 3 SENSOR) MISC, PLACE 1 SENSOR ON THE SKIN EVERY 14 DAYS. USE TO CHECK GLUCOSE CONTINUOUSLY, Disp: 2 each, Rfl: 6   Dulaglutide (TRULICITY) 0.75 MG/0.5ML SOAJ, INJECT 0.75 MG SUBCUTANEOUSLY ONE TIME PER WEEK, Disp: 6 mL, Rfl: 3   ferrous sulfate 325 (65 FE) MG tablet, Take 1 tablet (325 mg total) by mouth 2 (two) times daily with a meal., Disp: 60 tablet, Rfl: 0   lidocaine-prilocaine (EMLA) cream, Apply 1 Application topically as needed., Disp: 30 g, Rfl: 1   metFORMIN (GLUCOPHAGE-XR) 500 MG 24  hr tablet, TAKE 1 TABLET BY MOUTH EVERY DAY WITH BREAKFAST, Disp: 90 tablet, Rfl: 1   ondansetron (ZOFRAN) 8 MG tablet, One pill every 8 hours as needed for nausea/vomitting., Disp: 40 tablet, Rfl: 1   Vibegron (GEMTESA) 75 MG TABS, Take 1 tablet (75 mg total) by mouth daily., Disp: , Rfl:     ALLERGIES   Patient has no known allergies.     REVIEW OF SYSTEMS    Review of Systems:  Gen:  Denies  fever, sweats, chills weigh loss  HEENT: Denies blurred vision, double vision, ear pain, eye pain, hearing loss, nose bleeds, sore throat Cardiac:  No dizziness, chest pain or heaviness, chest tightness,edema Resp:   + cough +sputum porduction, +shortness of breath,+wheezing, hemoptysis,  Gi: Denies swallowing difficulty, stomach pain, nausea or vomiting, diarrhea, constipation, bowel incontinence Gu:  Denies bladder incontinence, burning urine Ext:   Denies Joint pain, stiffness or swelling Skin: Denies  skin rash, easy bruising or bleeding or hives Endoc:  Denies polyuria, polydipsia , polyphagia or weight change Psych:   Denies  depression, insomnia or hallucinations   Other:  All other systems negative   BP 130/70 (BP Location: Left Arm, Patient Position: Sitting, Cuff Size: Normal)   Pulse 81   Temp 97.6 F (36.4 C) (Temporal)   Ht 6' (1.829 m)   Wt 174 lb (78.9 kg)   SpO2 99%   BMI 23.60 kg/m     PHYSICAL EXAM  General Appearance: No distress  EYES PERRLA, EOM intact.   NECK Supple, No JVD Pulmonary: normal breath sounds, No wheezing.  CardiovascularNormal S1,S2.  No m/r/g.   Abdomen: Benign, Soft, non-tender. Skin:   warm, no rashes, no ecchymosis  Extremities: normal, no cyanosis, clubbing. Neuro:without focal findings,  speech normal  PSYCHIATRIC: Mood, affect within normal limits.   ALL OTHER ROS ARE NEGATIVE      IMAGING   CT of the chest 2023 reviewed in detail with patient today Areas of bronchiectasis areas of interstitial lung disease b/l  LLL Nodules 7.2MM, no masses seen     ASSESSMENT/PLAN   73 year old pleasant white male seen today for wound assessment for COPD in the setting of extensive smoking history 1 pack a day for the last 54 years also with abnormal CT chest findings with interstitial lung disease bronchiectasis and emphysematous changes, with ongoing chemotherapy for colon cancer in the setting of deconditioned state  Assessment of COPD There is a high clinical suspicion of COPD Will obtain pulmonary function testing to assess lung function Will try Trelegy inhaler 200 one puff daily Rinse mouth after every use Continue to use albuterol as needed Avoid Allergens and Irritants Avoid secondhand smoke Avoid SICK contacts Recommend  Masking  when appropriate Recommend Keep up-to-date with vaccinations With ongoing wheezing and productive cough patient is on antibiotics already will prescribe prednisone 20 mg daily for 7 days   Shortness of breath and dyspnea exertion Ambulatory pulse ox in the office did not reveal any significant hypoxia Will obtain  overnight pulse oximetry to assess for nocturnal hypoxia   Abnormal CT chest Areas of interstitial lung disease emphysema and bronchiectasis Most of his disease is caused by smoking  Bronchiectasis with chronic bronchitis Recommend exercise as tolerated Will discuss incentive spirometry and flutter valve at next office visit if inhaler therapy is suboptimal   Smoking history-follow-up lung cancer screening protocol CT scans of the chest reviewed in detail with patient Follow-up annually   Smoking Assessment and Cessation Counseling Upon  further questioning, Patient smokes 1 ppd I have advised patient to quit/stop smoking as soon as possible due to high risk for multiple medical problems Patient is NOT willing to quit smoking I have advised patient that we can assist and have options of Nicotine replacement therapy. I also advised patient on behavioral therapy and can provide oral medication therapy in conjunction with the other therapies Follow up next Office visit  for assessment of smoking cessation Smoking cessation counseling advised for >10 minutes       MEDICATION ADJUSTMENTS/LABS AND TESTS ORDERED: Recommend stop smoking Start Trelegy 201 puff daily Check overnight pulse oximetry pulmonary function test  Prednisone 20 mg daily for 7 days Continue the antibiotics  Avoid Allergens and Irritants Avoid secondhand smoke Avoid SICK contacts Recommend  Masking  when appropriate Recommend Keep up-to-date with vaccinations Follow-up lung cancer screening protocol  CURRENT MEDICATIONS REVIEWED AT LENGTH WITH PATIENT TODAY   Patient  satisfied with Plan of action and management. All questions answered  Follow up  3 months  I spent a total of 62 minutes reviewing chart data, face-to-face evaluation with the patient, counseling and coordination of care as detailed above.     Lucie Leather, M.D.  [facility]    Medical Director  [facility] Medical Director [facility] Cardio-Pulmonary Department

## 2024-02-05 ENCOUNTER — Inpatient Hospital Stay: Payer: Medicare PPO | Attending: Oncology

## 2024-02-05 ENCOUNTER — Inpatient Hospital Stay: Payer: Medicare PPO

## 2024-02-05 ENCOUNTER — Inpatient Hospital Stay: Payer: Medicare PPO | Admitting: Internal Medicine

## 2024-02-05 ENCOUNTER — Encounter: Payer: Self-pay | Admitting: Internal Medicine

## 2024-02-05 VITALS — BP 132/89 | HR 80 | Temp 97.8°F | Resp 14 | Wt 177.0 lb

## 2024-02-05 DIAGNOSIS — Z5111 Encounter for antineoplastic chemotherapy: Secondary | ICD-10-CM | POA: Diagnosis present

## 2024-02-05 DIAGNOSIS — E119 Type 2 diabetes mellitus without complications: Secondary | ICD-10-CM | POA: Insufficient documentation

## 2024-02-05 DIAGNOSIS — Z7985 Long-term (current) use of injectable non-insulin antidiabetic drugs: Secondary | ICD-10-CM | POA: Insufficient documentation

## 2024-02-05 DIAGNOSIS — C61 Malignant neoplasm of prostate: Secondary | ICD-10-CM | POA: Diagnosis present

## 2024-02-05 DIAGNOSIS — F17210 Nicotine dependence, cigarettes, uncomplicated: Secondary | ICD-10-CM | POA: Insufficient documentation

## 2024-02-05 DIAGNOSIS — Z9079 Acquired absence of other genital organ(s): Secondary | ICD-10-CM | POA: Diagnosis not present

## 2024-02-05 DIAGNOSIS — C186 Malignant neoplasm of descending colon: Secondary | ICD-10-CM | POA: Insufficient documentation

## 2024-02-05 DIAGNOSIS — Z7984 Long term (current) use of oral hypoglycemic drugs: Secondary | ICD-10-CM | POA: Diagnosis not present

## 2024-02-05 DIAGNOSIS — Z7952 Long term (current) use of systemic steroids: Secondary | ICD-10-CM | POA: Diagnosis not present

## 2024-02-05 LAB — CBC WITH DIFFERENTIAL (CANCER CENTER ONLY)
Abs Immature Granulocytes: 0.02 10*3/uL (ref 0.00–0.07)
Basophils Absolute: 0 10*3/uL (ref 0.0–0.1)
Basophils Relative: 1 %
Eosinophils Absolute: 0.2 10*3/uL (ref 0.0–0.5)
Eosinophils Relative: 4 %
HCT: 30 % — ABNORMAL LOW (ref 39.0–52.0)
Hemoglobin: 10.4 g/dL — ABNORMAL LOW (ref 13.0–17.0)
Immature Granulocytes: 0 %
Lymphocytes Relative: 32 %
Lymphs Abs: 1.8 10*3/uL (ref 0.7–4.0)
MCH: 34.9 pg — ABNORMAL HIGH (ref 26.0–34.0)
MCHC: 34.7 g/dL (ref 30.0–36.0)
MCV: 100.7 fL — ABNORMAL HIGH (ref 80.0–100.0)
Monocytes Absolute: 0.9 10*3/uL (ref 0.1–1.0)
Monocytes Relative: 16 %
Neutro Abs: 2.6 10*3/uL (ref 1.7–7.7)
Neutrophils Relative %: 47 %
Platelet Count: 128 10*3/uL — ABNORMAL LOW (ref 150–400)
RBC: 2.98 MIL/uL — ABNORMAL LOW (ref 4.22–5.81)
RDW: 14.6 % (ref 11.5–15.5)
WBC Count: 5.5 10*3/uL (ref 4.0–10.5)
nRBC: 0 % (ref 0.0–0.2)

## 2024-02-05 LAB — CMP (CANCER CENTER ONLY)
ALT: 19 U/L (ref 0–44)
AST: 21 U/L (ref 15–41)
Albumin: 3.8 g/dL (ref 3.5–5.0)
Alkaline Phosphatase: 57 U/L (ref 38–126)
Anion gap: 10 (ref 5–15)
BUN: 31 mg/dL — ABNORMAL HIGH (ref 8–23)
CO2: 22 mmol/L (ref 22–32)
Calcium: 9 mg/dL (ref 8.9–10.3)
Chloride: 105 mmol/L (ref 98–111)
Creatinine: 1.34 mg/dL — ABNORMAL HIGH (ref 0.61–1.24)
GFR, Estimated: 57 mL/min — ABNORMAL LOW (ref >60)
Glucose, Bld: 197 mg/dL — ABNORMAL HIGH (ref 70–99)
Potassium: 3.6 mmol/L (ref 3.5–5.1)
Sodium: 137 mmol/L (ref 135–145)
Total Bilirubin: 0.9 mg/dL (ref 0.0–1.2)
Total Protein: 6.9 g/dL (ref 6.5–8.1)

## 2024-02-05 MED ORDER — AMOXICILLIN 500 MG PO CAPS: 500.0000 mg | ORAL_CAPSULE | Freq: Three times a day (TID) | ORAL | 0 refills | Status: DC

## 2024-02-05 MED ORDER — HEPARIN SOD (PORK) LOCK FLUSH 100 UNIT/ML IV SOLN
500.0000 [IU] | Freq: Once | INTRAVENOUS | Status: AC
  Administered 2024-02-05: 500 [IU] via INTRAVENOUS
  Filled 2024-02-05: qty 5

## 2024-02-05 NOTE — Assessment & Plan Note (Signed)
#   OCT 2024-Colon cancer, status post a partial left colectomy 09/04/2023, stage IIIa (pT2 pN1b)-negative resection margins, lymphovascular invasion present, 3/11 lymph nodes, 2 tumor deposits. [Drs.Sherrill; White;Masarouty]. mismatch repair protein expression intact. FOLFOX chemotherapy is given every 2 weeks  #  HOLD chemo FOLFOX # 9 of planned 12- sec to UTI-.  Labs-CBC/chemistries were reviewed with the patient- Stable.   # Thrombocytopenia- > 100- sec to chemo- monitor ro for now. Stable.   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 Stable.   # COPD/ albuterol prn; s/p steroids [Dr.Purlmonary] on inhalers- stable.   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 UTI- on cipro- [Ms.McGowan]- entercoccus fecalis- no improvement- will start Amoxcicillin 500 mg TID>   # Diabetes- Trulicity/metformin- CGM-monitor closely on chemotherapy/steroids- stable  # IDA- Hb 9-10- ferritin- on PO iron; hold off venofer for now. NOv 2024- ferritni-98; sat- 30%- stable.   # Acitive smoker: Recommend quitting smoking.  Continue lung cancer screening-   # IV access port   # DISPOSITION:  # IV access  # HOLD chemo today- FOLFOX-chemo today; cancel- d-3 pump off;   # follow up in 2 weeks- MD; port/labs- cbc/cmp; FOLFOX; d-3 pump off;    # follow up in 4  weeks- MD; port/labs-- cbc/cmp; FOLFOX;possible   d-3 pump off;  Dr.B

## 2024-02-05 NOTE — Progress Notes (Signed)
 Patient was recently diagnosed with a bladder infection, he has one more cipro pill left to take. He feels like nothing is helping this time. He has painful urination. He was seen by his pulmonologist, which prescribed him a inhaler. He is wanting some suggestions for internal medicine providers for his wife, so I told him I have a print out I can give him.

## 2024-02-05 NOTE — Progress Notes (Signed)
 [facility] CONSULT NOTE  Patient Care Team: Ronnald Ramp, MD as PCP - General (Family Medicine) Benita Gutter, RN as Oncology Nurse Navigator Earna Coder, MD as Consulting Physician (Oncology)  CHIEF COMPLAINTS/PURPOSE OF CONSULTATION: Colon cancer  Oncology History Overview Note     Colon cancer, status post a partial left colectomy 09/04/2023, stage IIIa (pT2 pN1b) CT abdomen/pelvis 03/01/2023-prostate/pelvic abscess, large amount of colonic stool CT chest 03/30/2023-bilateral solid pulmonary nodules-likely benign, 75-month follow-up CT recommended Colonoscopy 04/12/2023-multiple polyps removed, greater than 50 mm polyp in the distal ascending colon and sigmoid colon not removed Colonoscopy 06/29/2023 greater than 50 mm polyps removed from the ascending, transverse, and descending colon-tubular adenomas, sigmoid lesion at 35 cm incompletely resected with biopsy revealing an ulcerated tubular adenoma with at least high-grade dysplasia and suspicious for invasive or [place] Descending colon resection 09/04/2023, pT2, PN 1B moderately differentiated adenocarcinoma, negative resection margins, lymphovascular invasion present, 3/11 lymph nodes, 2 tumor deposits, mismatch repair protein expression intact, MSS  Multiple colon polyps on colonoscopy 04/12/2023 and 06/29/2023-tubular adenomas Prostate cancer-Gleason 7 acinar adenocarcinoma involving 5-10% of submitted tissue from a TUR specimen 03/03/2023 Prostate 03/20/2023-mild prostamegaly- PI-RADS category 5 lesion in the left peripheral zone and category 4 lesion in the left anterior peripheral zone and left anterior fibromuscular stroma, reduced size of prostate abscess with continued surrounding prostatitis and periprostatic stranding IMRT 20 6/24 - 07/20/2023  4.  Diabetes 5.  History of colon cancer-negative CancerNext expanded panel 08/29/2023 6.  History of lung nodules, ongoing tobacco use, followed in the lung  cancer screening clinic# prostate/pelvic abscess.  He was admitted for antibiotics and drainage of the pelvic abscess 03/01/2023. A TUR and was found to have a Gleason 7 prostate cancer involving 5-10% of the submitted tissue.  He was referred to Dr. Rushie Chestnut and completed external beam radiation to the prostate.  # MAY 2024 [screening colo]- A greater than 50 mm polyp was removed from the proximal ascending colon.  Additional greater than 50 m polyps were not removed from the distal ascending and sigmoid colon.  Additional smaller polyps were removed from the colon. The pathology revealed multiple fragments of tubular adenomas. Dr. Baird Lyons than 50 mm polyps were removed from the ascending, transverse, and descending colon.  A polypoid lesion at 35 cm was incompletely resected and tattooed.  The pathology from multiple polyps returned as tubular adenomas without high-grade dysplasia or carcinoma.  # OCTOBER 2024- STAGE III SIGMOID COLON CANCER [Dr.Sherril/Dr.White- [place]] pathology from the sigmoid resection returned as invasive moderately differentiated adenocarcinoma involving the descending colon.  Carcinoma invades into but not through the muscularis propria.  The resection margins are negative.  Lymphovascular invasion is present.  Metastatic carcinoma was identified in 3 of 11 lymph nodes and there were 2 tumor deposits.  Mismatch repair protein expression is intact.   # Multiple colon polyps on colonoscopy 04/12/2023 and 06/29/2023-tubular adenomas- s/p  Genetics counseling: negative CancerNext expanded panel 08/29/2023  # NOV 4th, 2024- FOLFOX q 2 w x 6 months.   # APRIL 2024-  Prostate cancer [s/p EBRT- Dr.Chrystal]-    Cancer of descending colon (HCC)  09/19/2023 Initial Diagnosis   Cancer of descending colon (HCC)   09/19/2023 Cancer Staging   Staging form: Colon and Rectum, AJCC 8th Edition - Pathologic: pT2, cM0 - Signed by Ladene Artist, MD on 09/19/2023 Histologic grading  system: 4 grade system Histologic grade (G): G2 Residual tumor (R): R0 - None Laterality: Left Lymph-vascular invasion (LVI): LVI  present/identified, NOS Tumor deposits (TD): Present Perineural invasion (PNI): Absent Microsatellite instability (MSI): Stable   09/19/2023 Cancer Staging   Staging form: Colon and Rectum, AJCC 8th Edition - Pathologic: Stage IIIA (pT2, pN1b, cM0) - Signed by Ladene Artist, MD on 09/19/2023   10/11/2023 -  Chemotherapy   Patient is on Treatment Plan : COLORECTAL FOLFOX q14d x 6 months      HISTORY OF PRESENTING ILLNESS: Patient ambulating-independently.  Alone.  Colin Griffin 73 y.o.  male pleasant patient with  stage III colon cancer s/p left hemicolectomy currently on adjuvant chemotherapy with FOLFOX is here for a follow up.  Patient was recently diagnosed with a bladder infection, he has one more cipro pill left to take. He feels like nothing is helping this time. He has painful urination. No fever no chills. NO hematuria. S/p cipro- not improved.   He was seen by his pulmonologist, which prescribed him a inhaler. He is wanting some suggestions for internal medicine providers for his wife, so I told him I have a print out I can give hi   Mild tingling and numbness of the chemotherapy in his hands.    Denies any blood in stools or black-colored stools.  No nausea no vomiting.   Review of Systems  Constitutional:  Negative for chills, diaphoresis, fever, malaise/fatigue and weight loss.  HENT:  Negative for nosebleeds and sore throat.   Eyes:  Negative for double vision.  Respiratory:  Positive for cough. Negative for hemoptysis, sputum production, shortness of breath and wheezing.   Cardiovascular:  Negative for chest pain, palpitations, orthopnea and leg swelling.  Gastrointestinal:  Positive for nausea. Negative for abdominal pain, blood in stool, constipation, diarrhea, heartburn, melena and vomiting.  Genitourinary:  Negative for dysuria,  frequency and urgency.  Musculoskeletal:  Negative for back pain and joint pain.  Skin: Negative.  Negative for itching and rash.  Neurological:  Negative for dizziness, tingling, focal weakness, weakness and headaches.  Endo/Heme/Allergies:  Does not bruise/bleed easily.  Psychiatric/Behavioral:  Negative for depression. The patient is not nervous/anxious and does not have insomnia.     MEDICAL HISTORY:  Past Medical History:  Diagnosis Date   Cancer [facility]) April 2024   Constipation 03/04/2023   Diabetes mellitus without complication [facility]) March 2024    SURGICAL HISTORY: Past Surgical History:  Procedure Laterality Date   APPENDECTOMY     BIOPSY  06/29/2023   Procedure: BIOPSY;  Surgeon: Lemar Lofty., MD;  Location: [facility];  Service: Gastroenterology;;   COLONOSCOPY WITH PROPOFOL N/A 04/12/2023   Procedure: COLONOSCOPY WITH PROPOFOL;  Surgeon: [name], MD;  Location: [facility];  Service: Gastroenterology;  Laterality: N/A;   COLONOSCOPY WITH PROPOFOL N/A 06/29/2023   Procedure: COLONOSCOPY WITH PROPOFOL;  Surgeon: [name] Netty Starring., MD;  Location: [facility];  Service: Gastroenterology;  Laterality: N/A;   ENDOSCOPIC MUCOSAL RESECTION N/A 06/29/2023   Procedure: ENDOSCOPIC MUCOSAL RESECTION;  Surgeon: [name] Netty Starring., MD;  Location: [facility];  Service: Gastroenterology;  Laterality: N/A;   FLEXIBLE SIGMOIDOSCOPY N/A 09/04/2023   Procedure: FLEXIBLE SIGMOIDOSCOPY;  Surgeon: Andria Meuse, MD;  Location: [facility];  Service: General;  Laterality: N/A;   HEMOSTASIS CONTROL  06/29/2023   Procedure: HEMOSTASIS CONTROL;  Surgeon: Lemar Lofty., MD;  Location: [facility];  Service: Gastroenterology;;   IR IMAGING GUIDED PORT INSERTION  09/26/2023   IR RADIOLOGIST EVAL & MGMT  03/16/2023   PELVIC ABCESS DRAINAGE     March 2024  POLYPECTOMY  06/29/2023   Procedure: POLYPECTOMY;  Surgeon: Mansouraty, Netty Starring., MD;  Location: [facility];  Service: Gastroenterology;;   Sunnie Nielsen LIFTING INJECTION  06/29/2023   Procedure: SUBMUCOSAL LIFTING INJECTION;  Surgeon: Lemar Lofty., MD;  Location: [facility];  Service: Gastroenterology;;   SUBMUCOSAL TATTOO INJECTION  06/29/2023   Procedure: SUBMUCOSAL TATTOO INJECTION;  Surgeon: Lemar Lofty., MD;  Location: [facility];  Service: Gastroenterology;;   TRANSURETHRAL RESECTION OF PROSTATE N/A 03/03/2023   Procedure: TRANSURETHRAL RESECTION OF THE PROSTATE (TURP)/ UNROOFING OF PROSTATE ABSCESS;  Surgeon: Riki Altes, MD;  Location: [facility] OR[facility];  Service: Urology;  Laterality: N/A;    SOCIAL HISTORY: Social History   Socioeconomic History   Marital status: Married    Spouse name: Editor, commissioning   Number of children: 1   Years of education: Not on file   Highest education level: Master's degree (e.g., MA, MS, MEng, MEd, MSW, MBA)  Occupational History   Not on file  Tobacco Use   Smoking status: Every Day    Current packs/day: 1.50    Average packs/day: 1.5 packs/day for 54.2 years (81.3 ttl pk-yrs)    Types: Cigarettes    Start date: 19   Smokeless tobacco: Never   Tobacco comments:    Reports that at one time he smoked 3 packs per day while in the [employer]  Vaping Use   Vaping status: Every Day   Start date: 08/08/2022   Substances: Flavoring  Substance and Sexual Activity   Alcohol use: Not Currently   Drug use: Never   Sexual activity: Not Currently    Birth control/protection: None  Other Topics Concern   Not on file  Social History Narrative   ** Merged History Encounter **       Retired from Holiday representative for 30 years    Social Drivers of Corporate investment banker Strain: Low Risk  (05/01/2023)   Overall Financial Resource Strain (CARDIA)    Difficulty of Paying Living Expenses: Not hard at all  Food Insecurity: No Food Insecurity (09/19/2023)   Hunger Vital Sign    Worried About Running Out of Food in the Last  Year: Never true    Ran Out of Food in the Last Year: Never true  Transportation Needs: No Transportation Needs (09/19/2023)   PRAPARE - Administrator, Civil Service (Medical): No    Lack of Transportation (Non-Medical): No  Physical Activity: Sufficiently Active (05/01/2023)   Exercise Vital Sign    Days of Exercise per Week: 6 days    Minutes of Exercise per Session: 150+ min  Stress: No Stress Concern Present (05/01/2023)   [employer] of Occupational Health - Occupational Stress Questionnaire    Feeling of Stress : Not at all  Social Connections: Moderately Integrated (05/01/2023)   Social Connection and Isolation Panel [NHANES]    Frequency of Communication with Friends and Family: Three times a week    Frequency of Social Gatherings with Friends and Family: Twice a week    Attends Religious Services: More than 4 times per year    Active Member of [employer] or Organizations: No    Attends Banker Meetings: Patient declined    Marital Status: Married  Catering manager Violence: Not At Risk (09/19/2023)   Humiliation, Afraid, Rape, and Kick questionnaire    Fear of Current or Ex-Partner: No    Emotionally Abused: No    Physically Abused: No    Sexually Abused: No  FAMILY HISTORY: Family History  Problem Relation Age of Onset   Atrial fibrillation Mother    Skin cancer Father        non-melanoma   Colon cancer Sister 31       Lynch Syndrome   Breast cancer Sister 71   Diabetes Brother    Throat cancer Maternal Uncle    Breast cancer Paternal Aunt 16 - 36   Colon cancer Maternal Grandmother 28   Alcohol abuse Maternal Grandfather    Breast cancer Paternal Grandmother 102   Prostate cancer Paternal Grandfather 41 - 77   Other Niece        2 nieces have Lynch Syndrome (brother's daughters)   Diabetes Other    Ovarian cancer Other 24       paternal first cousin once removed   Colon polyps Neg Hx    Stomach cancer Neg Hx    Esophageal cancer Neg  Hx    Inflammatory bowel disease Neg Hx    Liver disease Neg Hx    Pancreatic cancer Neg Hx     ALLERGIES:  has no known allergies.  MEDICATIONS:  Current Outpatient Medications  Medication Sig Dispense Refill   amoxicillin (AMOXIL) 500 MG capsule Take 1 capsule (500 mg total) by mouth 3 (three) times daily. 21 capsule 0   albuterol (VENTOLIN HFA) 108 (90 Base) MCG/ACT inhaler Inhale 2 puffs into the lungs every 6 (six) hours as needed for wheezing or shortness of breath. 8 g 2   ciprofloxacin (CIPRO) 250 MG tablet Take 1 tablet (250 mg total) by mouth 2 (two) times daily. 14 tablet 0   Continuous Glucose Sensor (FREESTYLE LIBRE 3 SENSOR) MISC PLACE 1 SENSOR ON THE SKIN EVERY 14 DAYS. USE TO CHECK GLUCOSE CONTINUOUSLY 2 each 6   Dulaglutide (TRULICITY) 0.75 MG/0.5ML SOAJ INJECT 0.75 MG SUBCUTANEOUSLY ONE TIME PER WEEK 6 mL 3   ferrous sulfate 325 (65 FE) MG tablet Take 1 tablet (325 mg total) by mouth 2 (two) times daily with a meal. 60 tablet 0   Fluticasone-Umeclidin-Vilant (TRELEGY ELLIPTA) 200-62.5-25 MCG/ACT AEPB Inhale 1 Act into the lungs daily. 1 each 6   Fluticasone-Umeclidin-Vilant (TRELEGY ELLIPTA) 200-62.5-25 MCG/ACT AEPB Inhale 1 Inhalation into the lungs daily in the afternoon. 1 each    lidocaine-prilocaine (EMLA) cream Apply 1 Application topically as needed. 30 g 1   metFORMIN (GLUCOPHAGE-XR) 500 MG 24 hr tablet TAKE 1 TABLET BY MOUTH EVERY DAY WITH BREAKFAST 90 tablet 1   ondansetron (ZOFRAN) 8 MG tablet One pill every 8 hours as needed for nausea/vomitting. 40 tablet 1   predniSONE (DELTASONE) 20 MG tablet Take 1 tablet (20 mg total) by mouth daily with breakfast. 7 days 7 tablet 1   Vibegron (GEMTESA) 75 MG TABS Take 1 tablet (75 mg total) by mouth daily. (Patient not taking: Reported on 01/30/2024)     No current facility-administered medications for this visit.    PHYSICAL EXAMINATION:   Vitals:   02/05/24 0839  BP: 132/89  Pulse: 80  Resp: 14  Temp: 97.8 F  (36.6 C)  SpO2: 99%   Filed Weights   02/05/24 0839  Weight: 177 lb (80.3 kg)    Physical Exam Vitals and nursing note reviewed.  HENT:     Head: Normocephalic and atraumatic.     Mouth/Throat:     Pharynx: Oropharynx is clear.  Eyes:     Extraocular Movements: Extraocular movements intact.     Pupils: Pupils are equal, round, and reactive  to light.  Cardiovascular:     Rate and Rhythm: Normal rate and regular rhythm.  Pulmonary:     Comments: Decreased breath sounds bilaterally.  Abdominal:     Palpations: Abdomen is soft.  Musculoskeletal:        General: Normal range of motion.     Cervical back: Normal range of motion.  Skin:    General: Skin is warm.  Neurological:     General: No focal deficit present.     Mental Status: He is alert and oriented to person, place, and time.  Psychiatric:        Behavior: Behavior normal.        Judgment: Judgment normal.     LABORATORY DATA:  I have reviewed the data as listed Lab Results  Component Value Date   WBC 5.5 02/05/2024   HGB 10.4 (L) 02/05/2024   HCT 30.0 (L) 02/05/2024   MCV 100.7 (H) 02/05/2024   PLT 128 (L) 02/05/2024   Recent Labs    03/01/23 1954 03/02/23 0504 01/08/24 0857 01/22/24 0821 02/05/24 0814  NA  --    < > 136 137 137  K  --    < > 3.7 3.4* 3.6  CL  --    < > 108 109 105  CO2  --    < > 21* 19* 22  GLUCOSE  --    < > 191* 191* 197*  BUN  --    < > 27* 19 31*  CREATININE  --    < > 0.94 0.95 1.34*  CALCIUM  --    < > 8.9 8.9 9.0  GFRNONAA  --    < > >60 >60 57*  PROT 8.4*   < > 6.5 6.3* 6.9  ALBUMIN 3.2*   < > 3.7 3.4* 3.8  AST 40   < > 20 20 21   ALT 48*   < > 16 15 19   ALKPHOS 186*   < > 56 50 57  BILITOT 0.8   < > 0.6 0.5 0.9  BILIDIR 0.2  --   --   --   --   IBILI 0.6  --   --   --   --    < > = values in this interval not displayed.    RADIOGRAPHIC STUDIES: I have personally reviewed the radiological images as listed and agreed with the findings in the report. No results  found.   Cancer of descending colon (HCC) # OCT 2024-Colon cancer, status post a partial left colectomy 09/04/2023, stage IIIa (pT2 pN1b)-negative resection margins, lymphovascular invasion present, 3/11 lymph nodes, 2 tumor deposits. [Drs.Sherrill; White;Masarouty]. mismatch repair protein expression intact. FOLFOX chemotherapy is given every 2 weeks  #  HOLD chemo FOLFOX # 9 of planned 12- sec to UTI-.  Labs-CBC/chemistries were reviewed with the patient- Stable.   # Thrombocytopenia- > 100- sec to chemo- monitor ro for now. Stable.   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 Stable.   # COPD/ albuterol prn; s/p steroids [Dr.Purlmonary] on inhalers- stable.   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 UTI- on cipro- [Ms.McGowan]- entercoccus fecalis- no improvement- will start Amoxcicillin 500 mg TID>   # Diabetes- Trulicity/metformin- CGM-monitor closely on chemotherapy/steroids- stable  # IDA- Hb 9-10- ferritin- on PO iron; hold off venofer for now. NOv 2024- ferritni-98;  sat- 30%- stable.   # Acitive smoker: Recommend quitting smoking.  Continue lung cancer screening-   # IV access port   # DISPOSITION:  # IV access  # HOLD chemo today- FOLFOX-chemo today; cancel- d-3 pump off;   # follow up in 2 weeks- MD; port/labs- cbc/cmp; FOLFOX; d-3 pump off;    # follow up in 4  weeks- MD; port/labs-- cbc/cmp; FOLFOX;possible   d-3 pump off;  Dr.B   Above plan of care was discussed with patient/family in detail.  My contact information was given to the patient/family.     Earna Coder, MD 02/05/2024 9:31 AM

## 2024-02-07 ENCOUNTER — Inpatient Hospital Stay: Payer: Medicare PPO

## 2024-02-19 ENCOUNTER — Inpatient Hospital Stay

## 2024-02-19 ENCOUNTER — Inpatient Hospital Stay: Admitting: Internal Medicine

## 2024-02-19 ENCOUNTER — Other Ambulatory Visit: Payer: Medicare PPO

## 2024-02-19 ENCOUNTER — Ambulatory Visit: Payer: Medicare PPO

## 2024-02-19 ENCOUNTER — Encounter: Payer: Self-pay | Admitting: Internal Medicine

## 2024-02-19 ENCOUNTER — Ambulatory Visit: Payer: Medicare PPO | Admitting: Internal Medicine

## 2024-02-19 VITALS — BP 133/89 | HR 81

## 2024-02-19 VITALS — BP 135/83 | HR 73 | Temp 97.7°F | Resp 16 | Ht 72.0 in | Wt 180.6 lb

## 2024-02-19 DIAGNOSIS — Z5111 Encounter for antineoplastic chemotherapy: Secondary | ICD-10-CM | POA: Diagnosis not present

## 2024-02-19 DIAGNOSIS — C186 Malignant neoplasm of descending colon: Secondary | ICD-10-CM | POA: Diagnosis not present

## 2024-02-19 DIAGNOSIS — C61 Malignant neoplasm of prostate: Secondary | ICD-10-CM

## 2024-02-19 LAB — CMP (CANCER CENTER ONLY)
ALT: 15 U/L (ref 0–44)
AST: 19 U/L (ref 15–41)
Albumin: 3.4 g/dL — ABNORMAL LOW (ref 3.5–5.0)
Alkaline Phosphatase: 55 U/L (ref 38–126)
Anion gap: 9 (ref 5–15)
BUN: 15 mg/dL (ref 8–23)
CO2: 21 mmol/L — ABNORMAL LOW (ref 22–32)
Calcium: 8.8 mg/dL — ABNORMAL LOW (ref 8.9–10.3)
Chloride: 107 mmol/L (ref 98–111)
Creatinine: 1.13 mg/dL (ref 0.61–1.24)
GFR, Estimated: >60 mL/min (ref >60)
Glucose, Bld: 208 mg/dL — ABNORMAL HIGH (ref 70–99)
Potassium: 3.6 mmol/L (ref 3.5–5.1)
Sodium: 137 mmol/L (ref 135–145)
Total Bilirubin: 0.5 mg/dL (ref 0.0–1.2)
Total Protein: 6.5 g/dL (ref 6.5–8.1)

## 2024-02-19 LAB — CBC WITH DIFFERENTIAL (CANCER CENTER ONLY)
Abs Immature Granulocytes: 0.1 10*3/uL — ABNORMAL HIGH (ref 0.00–0.07)
Basophils Absolute: 0 10*3/uL (ref 0.0–0.1)
Basophils Relative: 1 %
Eosinophils Absolute: 0.3 10*3/uL (ref 0.0–0.5)
Eosinophils Relative: 4 %
HCT: 31.2 % — ABNORMAL LOW (ref 39.0–52.0)
Hemoglobin: 10.5 g/dL — ABNORMAL LOW (ref 13.0–17.0)
Immature Granulocytes: 1 %
Lymphocytes Relative: 19 %
Lymphs Abs: 1.5 10*3/uL (ref 0.7–4.0)
MCH: 34.4 pg — ABNORMAL HIGH (ref 26.0–34.0)
MCHC: 33.7 g/dL (ref 30.0–36.0)
MCV: 102.3 fL — ABNORMAL HIGH (ref 80.0–100.0)
Monocytes Absolute: 0.9 10*3/uL (ref 0.1–1.0)
Monocytes Relative: 12 %
Neutro Abs: 4.7 10*3/uL (ref 1.7–7.7)
Neutrophils Relative %: 63 %
Platelet Count: 152 10*3/uL (ref 150–400)
RBC: 3.05 MIL/uL — ABNORMAL LOW (ref 4.22–5.81)
RDW: 13.1 % (ref 11.5–15.5)
WBC Count: 7.5 10*3/uL (ref 4.0–10.5)
nRBC: 0 % (ref 0.0–0.2)

## 2024-02-19 LAB — PSA: Prostatic Specific Antigen: 0.07 ng/mL (ref 0.00–4.00)

## 2024-02-19 MED ORDER — DEXTROSE 5 % IV SOLN
400.0000 mg/m2 | Freq: Once | INTRAVENOUS | Status: AC
  Administered 2024-02-19: 796 mg via INTRAVENOUS
  Filled 2024-02-19: qty 22.3

## 2024-02-19 MED ORDER — DEXAMETHASONE SODIUM PHOSPHATE 10 MG/ML IJ SOLN
10.0000 mg | Freq: Once | INTRAMUSCULAR | Status: AC
  Administered 2024-02-19: 10 mg via INTRAVENOUS
  Filled 2024-02-19: qty 1

## 2024-02-19 MED ORDER — PALONOSETRON HCL INJECTION 0.25 MG/5ML
0.2500 mg | Freq: Once | INTRAVENOUS | Status: AC
  Administered 2024-02-19: 0.25 mg via INTRAVENOUS
  Filled 2024-02-19: qty 5

## 2024-02-19 MED ORDER — SODIUM CHLORIDE 0.9% FLUSH
10.0000 mL | INTRAVENOUS | Status: DC | PRN
  Filled 2024-02-19: qty 10

## 2024-02-19 MED ORDER — HEPARIN SOD (PORK) LOCK FLUSH 100 UNIT/ML IV SOLN
500.0000 [IU] | Freq: Once | INTRAVENOUS | Status: DC | PRN
  Filled 2024-02-19: qty 5

## 2024-02-19 MED ORDER — FLUOROURACIL CHEMO INJECTION 2.5 GM/50ML
400.0000 mg/m2 | Freq: Once | INTRAVENOUS | Status: AC
  Administered 2024-02-19: 800 mg via INTRAVENOUS
  Filled 2024-02-19: qty 16

## 2024-02-19 MED ORDER — DEXTROSE 5 % IV SOLN
INTRAVENOUS | Status: DC
  Filled 2024-02-19: qty 250

## 2024-02-19 MED ORDER — OXALIPLATIN CHEMO INJECTION 100 MG/20ML
85.0000 mg/m2 | Freq: Once | INTRAVENOUS | Status: AC
  Administered 2024-02-19: 170 mg via INTRAVENOUS
  Filled 2024-02-19: qty 34

## 2024-02-19 MED ORDER — SODIUM CHLORIDE 0.9 % IV SOLN
2400.0000 mg/m2 | INTRAVENOUS | Status: DC
  Administered 2024-02-19: 5000 mg via INTRAVENOUS
  Filled 2024-02-19: qty 100

## 2024-02-19 NOTE — Assessment & Plan Note (Addendum)
#   OCT 2024-Colon cancer, status post a partial left colectomy 09/04/2023, stage IIIa (pT2 pN1b)-negative resection margins, lymphovascular invasion present, 3/11 lymph nodes, 2 tumor deposits. [Drs.Sherrill; White;Masarouty]. mismatch repair protein expression intact. FOLFOX chemotherapy is given every 2 weeks. Awaiting colonoscopy < 1 year.    #  Proceed with chemo FOLFOX # 9 of planned 12.   Labs-CBC/chemistries were reviewed with the patient- Stable.   # Thrombocytopenia- > 100- sec to chemo- monitor ro for now. Stable.   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 Stable.    # COPD/ albuterol prn; s/p steroids [Dr.Purlmonary] on inhalers- stable.   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 UTI- on cipro- [Ms.McGowan]- entercoccus fecalis- s/p Amoxcicillin 500 mg TID- Resolved- monitor for now.  # Diabetes- Trulicity/metformin- CGM-monitor closely on chemotherapy/steroids- stable  # IDA- Hb 9-10- ferritin- on PO iron; hold off venofer for now. NOv 2024- ferritni-98; sat- 30%-  stable  # Acitive smoker: Recommend quitting smoking.  Continue lung cancer screening-   # IV access port   # DISPOSITION:  # IV access  # Chemo today- FOLFOX-chemo today; cancel- d-3 pump off;   # follow up in 2 weeks- MD; port/labs- cbc/cmp; FOLFOX; d-3 pump off;    # follow up in 4  weeks- MD; port/labs-- cbc/cmp; FOLFOX;possible   d-3 pump off;  Dr.B

## 2024-02-19 NOTE — Patient Instructions (Signed)
 [facility] - A DEPT OF [facility]  Discharge Instructions: Thank you for choosing [facility] to provide your oncology and hematology care.  If you have a lab appointment with the Cancer Center, please go directly to the Cancer Center and check in at the registration area.  Wear comfortable clothing and clothing appropriate for easy access to any Portacath or PICC line.   We strive to give you quality time with your provider. You may need to reschedule your appointment if you arrive late (15 or more minutes).  Arriving late affects you and other patients whose appointments are after yours.  Also, if you miss three or more appointments without notifying the office, you may be dismissed from the clinic at the provider's discretion.      For prescription refill requests, have your pharmacy contact our office and allow 72 hours for refills to be completed.    Today you received the following chemotherapy and/or immunotherapy agents- oxaliplatin, leucovorin, 5FU      To help prevent nausea and vomiting after your treatment, we encourage you to take your nausea medication as directed.  BELOW ARE SYMPTOMS THAT SHOULD BE REPORTED IMMEDIATELY: *FEVER GREATER THAN 100.4 F (38 C) OR HIGHER *CHILLS OR SWEATING *NAUSEA AND VOMITING THAT IS NOT CONTROLLED WITH YOUR NAUSEA MEDICATION *UNUSUAL SHORTNESS OF BREATH *UNUSUAL BRUISING OR BLEEDING *URINARY PROBLEMS (pain or burning when urinating, or frequent urination) *BOWEL PROBLEMS (unusual diarrhea, constipation, pain near the anus) TENDERNESS IN MOUTH AND THROAT WITH OR WITHOUT PRESENCE OF ULCERS (sore throat, sores in mouth, or a toothache) UNUSUAL RASH, SWELLING OR PAIN  UNUSUAL VAGINAL DISCHARGE OR ITCHING   Items with * indicate a potential emergency and should be followed up as soon as possible or go to the Emergency Department if any problems should occur.  Please show the CHEMOTHERAPY ALERT CARD  or IMMUNOTHERAPY ALERT CARD at check-in to the Emergency Department and triage nurse.  Should you have questions after your visit or need to cancel or reschedule your appointment, please contact [facility] - A DEPT OF [facility]  [phone] and follow the prompts.  Office hours are 8:00 a.m. to 4:30 p.m. Monday - Friday. Please note that voicemails left after 4:00 p.m. may not be returned until the following business day.  We are closed weekends and major holidays. You have access to a nurse at all times for urgent questions. Please call the main number to the clinic [phone] and follow the prompts.  For any non-urgent questions, you may also contact your provider using MyChart. We now offer e-Visits for anyone 66 and older to request care online for non-urgent symptoms. For details visit [email or web address].   Also download the MyChart app! Go to the app store, search "MyChart", open the app, select , and log in with your MyChart username and password.

## 2024-02-19 NOTE — Progress Notes (Signed)
 After treatments are done, what is the next step?

## 2024-02-19 NOTE — Progress Notes (Unsigned)
 02/20/2024 8:47 PM   Colin Griffin 1951/06/01 [id number]  Referring provider: Ronnald Ramp, MD [address] [place],  [place] [address]  Urological history: 1. Prostate abscess -Associated with a large history of rectal abscess (02/2023)  2. 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  -Gold seeds placed 04/2023 -completed IMRT (07/2023)   No chief complaint on file.  HPI: Colin Griffin is a 73 y.o. male who presents today for burning with urination.   Previous records reviewed.     At his visit on 01/23/2024, 1 month ago he had the onset of urgency, dysuria and urge incontinence.  He has also had some burning in the rectal area.  Patient denies any modifying or aggravating factors.  Patient denies any recent UTI's, gross hematuria or suprapubic/flank pain.  Patient denies any fevers, chills, nausea or vomiting.   UA yeTheir urine culture was positive for multiple species.  llow hazy, specific gravity 1.013, pH 5, glucose > 500, mucus present and Ca OX crystals. (From cancer center yesterday)  PVR 111 mL.  Urine culture was positive for enterococcus faecalis.    He was started on 7 days of Cipro 250 mg twice daily.   His symptoms did not about and Dr. Donneta Romberg put him on Amoxicillin.  He was also give samples of Gemtesa.     UA***  PVR***    PMH: Past Medical History:  Diagnosis Date   Cancer [facility]) April 2024   Constipation 03/04/2023   Diabetes mellitus without complication [facility]) March 2024    Surgical History: Past Surgical History:  Procedure Laterality Date   APPENDECTOMY     BIOPSY  06/29/2023   Procedure: BIOPSY;  Surgeon: Lemar Lofty., MD;  Location: [facility];  Service: Gastroenterology;;   COLONOSCOPY WITH PROPOFOL N/A 04/12/2023   Procedure: COLONOSCOPY WITH PROPOFOL;  Surgeon: [name], MD;   Location: [facility];  Service: Gastroenterology;  Laterality: N/A;   COLONOSCOPY WITH PROPOFOL N/A 06/29/2023   Procedure: COLONOSCOPY WITH PROPOFOL;  Surgeon: [name] Netty Starring., MD;  Location: [facility];  Service: Gastroenterology;  Laterality: N/A;   ENDOSCOPIC MUCOSAL RESECTION N/A 06/29/2023   Procedure: ENDOSCOPIC MUCOSAL RESECTION;  Surgeon: [name] Netty Starring., MD;  Location: [facility];  Service: Gastroenterology;  Laterality: N/A;   FLEXIBLE SIGMOIDOSCOPY N/A 09/04/2023   Procedure: FLEXIBLE SIGMOIDOSCOPY;  Surgeon: Andria Meuse, MD;  Location: [facility];  Service: General;  Laterality: N/A;   HEMOSTASIS CONTROL  06/29/2023   Procedure: HEMOSTASIS CONTROL;  Surgeon: Lemar Lofty., MD;  Location: [facility];  Service: Gastroenterology;;   IR IMAGING GUIDED PORT INSERTION  09/26/2023   IR RADIOLOGIST EVAL & MGMT  03/16/2023   PELVIC ABCESS DRAINAGE     March 2024   POLYPECTOMY  06/29/2023   Procedure: POLYPECTOMY;  Surgeon: Mansouraty, Netty Starring., MD;  Location: [facility];  Service: Gastroenterology;;   Sunnie Nielsen LIFTING INJECTION  06/29/2023   Procedure: SUBMUCOSAL LIFTING INJECTION;  Surgeon: Lemar Lofty., MD;  Location: [facility];  Service: Gastroenterology;;   SUBMUCOSAL TATTOO INJECTION  06/29/2023   Procedure: SUBMUCOSAL TATTOO INJECTION;  Surgeon: Lemar Lofty., MD;  Location: [facility];  Service: Gastroenterology;;   TRANSURETHRAL RESECTION OF PROSTATE N/A 03/03/2023   Procedure: TRANSURETHRAL RESECTION OF THE PROSTATE (TURP)/ UNROOFING OF PROSTATE ABSCESS;  Surgeon: Riki Altes, MD;  Location: [facility];  Service: Urology;  Laterality: N/A;    Home  Medications:  Allergies as of 02/20/2024   No Known Allergies      Medication List        Accurate as of February 19, 2024  8:47 PM. If you have any questions, ask your nurse or doctor.          albuterol 108 (90 Base) MCG/ACT inhaler Commonly known as: VENTOLIN  HFA Inhale 2 puffs into the lungs every 6 (six) hours as needed for wheezing or shortness of breath.   FeroSul 325 (65 Fe) MG tablet Generic drug: ferrous sulfate Take 1 tablet (325 mg total) by mouth 2 (two) times daily with a meal.   FreeStyle Libre 3 Sensor Misc PLACE 1 SENSOR ON THE SKIN EVERY 14 DAYS. USE TO CHECK GLUCOSE CONTINUOUSLY   Gemtesa 75 MG Tabs Generic drug: Vibegron Take 1 tablet (75 mg total) by mouth daily.   lidocaine-prilocaine cream Commonly known as: EMLA Apply 1 Application topically as needed.   metFORMIN 500 MG 24 hr tablet Commonly known as: GLUCOPHAGE-XR TAKE 1 TABLET BY MOUTH EVERY DAY WITH BREAKFAST   ondansetron 8 MG tablet Commonly known as: ZOFRAN One pill every 8 hours as needed for nausea/vomitting.   Trelegy Ellipta 200-62.5-25 MCG/ACT Aepb Generic drug: Fluticasone-Umeclidin-Vilant Inhale 1 Act into the lungs daily.   Trelegy Ellipta 200-62.5-25 MCG/ACT Aepb Generic drug: Fluticasone-Umeclidin-Vilant Inhale 1 Inhalation into the lungs daily in the afternoon.   Trulicity 0.75 MG/0.5ML Soaj Generic drug: Dulaglutide INJECT 0.75 MG SUBCUTANEOUSLY ONE TIME PER WEEK        Allergies: No Known Allergies  Family History: Family History  Problem Relation Age of Onset   Atrial fibrillation Mother    Skin cancer Father        non-melanoma   Colon cancer Sister 76       Lynch Syndrome   Breast cancer Sister 60   Diabetes Brother    Throat cancer Maternal Uncle    Breast cancer Paternal Aunt 31 - 36   Colon cancer Maternal Grandmother 67   Alcohol abuse Maternal Grandfather    Breast cancer Paternal Grandmother 102   Prostate cancer Paternal Grandfather 39 - 7   Other Niece        2 nieces have Lynch Syndrome (brother's daughters)   Diabetes Other    Ovarian cancer Other 85       paternal first cousin once removed   Colon polyps Neg Hx    Stomach cancer Neg Hx    Esophageal cancer Neg Hx    Inflammatory bowel disease Neg Hx     Liver disease Neg Hx    Pancreatic cancer Neg Hx     Social History:  reports that he has been smoking cigarettes. He started smoking about 54 years ago. He has a 81.3 pack-year smoking history. He has never used smokeless tobacco. He reports that he does not currently use alcohol. He reports that he does not use drugs.  ROS: Pertinent ROS in HPI  Physical Exam: There were no vitals taken for this visit.  Constitutional:  Well nourished. Alert and oriented, No acute distress. HEENT: [place] AT, moist mucus membranes.  Trachea midline, no masses. Cardiovascular: No clubbing, cyanosis, or edema. Respiratory: Normal respiratory effort, no increased work of breathing. GI: Abdomen is soft, non tender, non distended, no abdominal masses. Liver and spleen not palpable.  No hernias appreciated.  Stool sample for occult testing is not indicated.   GU: No CVA tenderness.  No bladder fullness or masses.  Patient  with circumcised/uncircumcised phallus. ***Foreskin easily retracted***  Urethral meatus is patent.  No penile discharge. No penile lesions or rashes. Scrotum without lesions, cysts, rashes and/or edema.  Testicles are located scrotally bilaterally. No masses are appreciated in the testicles. Left and right epididymis are normal. Rectal: Patient with  normal sphincter tone. Anus and perineum without scarring or rashes. No rectal masses are appreciated. Prostate is approximately *** grams, *** nodules are appreciated. Seminal vesicles are normal. Skin: No rashes, bruises or suspicious lesions. Lymph: No cervical or inguinal adenopathy. Neurologic: Grossly intact, no focal deficits, moving all 4 extremities. Psychiatric: Normal mood and affect.   Laboratory Data: Lab Results  Component Value Date   WBC 7.5 02/19/2024   HGB 10.5 (L) 02/19/2024   HCT 31.2 (L) 02/19/2024   MCV 102.3 (H) 02/19/2024   PLT 152 02/19/2024   Lab Results  Component Value Date   CREATININE 1.13 02/19/2024   Lab  Results  Component Value Date   AST 19 02/19/2024   Lab Results  Component Value Date   ALT 15 02/19/2024   Component     Latest Ref Rng 02/19/2024  Prostatic Specific Antigen     0.00 - 4.00 ng/mL 0.07     Urinalysis See EPIC and HPI  I have reviewed the labs.   Pertinent Imaging: ***  Assessment & Plan:    1. Urgency/urge incontinence  -UA benign  -Urine culture pending -Explained that this may be a side effect of his radiation and chemotherapy and give him samples of Gemtesa 75 mg daily -If urine culture is positive we will prescribe culture appropriate antibiotics  2. Prostate cancer -recent PSA demonstrates good biochemical control  3. Microscopic hematuria -previous UA with micro heme, but with  infected urine -UA ***   No follow-ups on file.  These notes generated with voice recognition software. I apologize for typographical errors.  Cloretta Ned  [facility] [address]  [address] [place], [place] [address] [phone]

## 2024-02-19 NOTE — Progress Notes (Signed)
 [name] CONSULT NOTE  Patient Care Team: Ronnald Ramp, MD as PCP - General (Family Medicine) Benita Gutter, RN as Oncology Nurse Navigator Earna Coder, MD as Consulting Physician (Oncology)  CHIEF COMPLAINTS/PURPOSE OF CONSULTATION: Colon cancer  Oncology History Overview Note     Colon cancer, status post a partial left colectomy 09/04/2023, stage IIIa (pT2 pN1b) CT abdomen/pelvis 03/01/2023-prostate/pelvic abscess, large amount of colonic stool CT chest 03/30/2023-bilateral solid pulmonary nodules-likely benign, 76-month follow-up CT recommended Colonoscopy 04/12/2023-multiple polyps removed, greater than 50 mm polyp in the distal ascending colon and sigmoid colon not removed Colonoscopy 06/29/2023 greater than 50 mm polyps removed from the ascending, transverse, and descending colon-tubular adenomas, sigmoid lesion at 35 cm incompletely resected with biopsy revealing an ulcerated tubular adenoma with at least high-grade dysplasia and suspicious for invasive or [place] Descending colon resection 09/04/2023, pT2, PN 1B moderately differentiated adenocarcinoma, negative resection margins, lymphovascular invasion present, 3/11 lymph nodes, 2 tumor deposits, mismatch repair protein expression intact, MSS  Multiple colon polyps on colonoscopy 04/12/2023 and 06/29/2023-tubular adenomas Prostate cancer-Gleason 7 acinar adenocarcinoma involving 5-10% of submitted tissue from a TUR specimen 03/03/2023 Prostate 03/20/2023-mild prostamegaly- PI-RADS category 5 lesion in the left peripheral zone and category 4 lesion in the left anterior peripheral zone and left anterior fibromuscular stroma, reduced size of prostate abscess with continued surrounding prostatitis and periprostatic stranding IMRT 20 6/24 - 07/20/2023  4.  Diabetes 5.  History of colon cancer-negative CancerNext expanded panel 08/29/2023 6.  History of lung nodules, ongoing tobacco use, followed in the lung  cancer screening clinic# prostate/pelvic abscess.  He was admitted for antibiotics and drainage of the pelvic abscess 03/01/2023. A TUR and was found to have a Gleason 7 prostate cancer involving 5-10% of the submitted tissue.  He was referred to Dr. Rushie Chestnut and completed external beam radiation to the prostate.  # MAY 2024 [screening colo]- A greater than 50 mm polyp was removed from the proximal ascending colon.  Additional greater than 50 m polyps were not removed from the distal ascending and sigmoid colon.  Additional smaller polyps were removed from the colon. The pathology revealed multiple fragments of tubular adenomas. Dr. Baird Lyons than 50 mm polyps were removed from the ascending, transverse, and descending colon.  A polypoid lesion at 35 cm was incompletely resected and tattooed.  The pathology from multiple polyps returned as tubular adenomas without high-grade dysplasia or carcinoma.  # OCTOBER 2024- STAGE III SIGMOID COLON CANCER [Dr.Sherril/Dr.White- [place]] pathology from the sigmoid resection returned as invasive moderately differentiated adenocarcinoma involving the descending colon.  Carcinoma invades into but not through the muscularis propria.  The resection margins are negative.  Lymphovascular invasion is present.  Metastatic carcinoma was identified in 3 of 11 lymph nodes and there were 2 tumor deposits.  Mismatch repair protein expression is intact.   # Multiple colon polyps on colonoscopy 04/12/2023 and 06/29/2023-tubular adenomas- s/p  Genetics counseling: negative CancerNext expanded panel 08/29/2023  # NOV 4th, 2024- FOLFOX q 2 w x 6 months.   # APRIL 2024-  Prostate cancer [s/p EBRT- Dr.Chrystal]-    Cancer of descending colon (HCC)  09/19/2023 Initial Diagnosis   Cancer of descending colon (HCC)   09/19/2023 Cancer Staging   Staging form: Colon and Rectum, AJCC 8th Edition - Pathologic: pT2, cM0 - Signed by Ladene Artist, MD on 09/19/2023 Histologic grading  system: 4 grade system Histologic grade (G): G2 Residual tumor (R): R0 - None Laterality: Left Lymph-vascular invasion (LVI): LVI  present/identified, NOS Tumor deposits (TD): Present Perineural invasion (PNI): Absent Microsatellite instability (MSI): Stable   09/19/2023 Cancer Staging   Staging form: Colon and Rectum, AJCC 8th Edition - Pathologic: Stage IIIA (pT2, pN1b, cM0) - Signed by Ladene Artist, MD on 09/19/2023   10/11/2023 -  Chemotherapy   Patient is on Treatment Plan : COLORECTAL FOLFOX q14d x 6 months      HISTORY OF PRESENTING ILLNESS: Patient ambulating-independently.  Alone.  Colin Griffin 73 y.o.  male pleasant patient with  stage III colon cancer s/p left hemicolectomy currently on adjuvant chemotherapy with FOLFOX is here for a follow up.  Patient was recently diagnosed with a bladder infection-status post treatment with antibiotic.  2 weeks ago HELD chemo FOLFOX # 9 of planned 12- sec to UTI. Symptoms improved.    Cough improved after the inhalers.  Mild tingling and numbness of the chemotherapy in his hands.    Denies any blood in stools or black-colored stools.  No nausea no vomiting.   Review of Systems  Constitutional:  Negative for chills, diaphoresis, fever, malaise/fatigue and weight loss.  HENT:  Negative for nosebleeds and sore throat.   Eyes:  Negative for double vision.  Respiratory:  Positive for cough. Negative for hemoptysis, sputum production, shortness of breath and wheezing.   Cardiovascular:  Negative for chest pain, palpitations, orthopnea and leg swelling.  Gastrointestinal:  Positive for nausea. Negative for abdominal pain, blood in stool, constipation, diarrhea, heartburn, melena and vomiting.  Genitourinary:  Negative for dysuria, frequency and urgency.  Musculoskeletal:  Negative for back pain and joint pain.  Skin: Negative.  Negative for itching and rash.  Neurological:  Negative for dizziness, tingling, focal weakness, weakness  and headaches.  Endo/Heme/Allergies:  Does not bruise/bleed easily.  Psychiatric/Behavioral:  Negative for depression. The patient is not nervous/anxious and does not have insomnia.     MEDICAL HISTORY:  Past Medical History:  Diagnosis Date   Cancer [facility]) April 2024   Constipation 03/04/2023   Diabetes mellitus without complication [facility]) March 2024    SURGICAL HISTORY: Past Surgical History:  Procedure Laterality Date   APPENDECTOMY     BIOPSY  06/29/2023   Procedure: BIOPSY;  Surgeon: Lemar Lofty., MD;  Location: [facility];  Service: Gastroenterology;;   COLONOSCOPY WITH PROPOFOL N/A 04/12/2023   Procedure: COLONOSCOPY WITH PROPOFOL;  Surgeon: [name], MD;  Location: [facility];  Service: Gastroenterology;  Laterality: N/A;   COLONOSCOPY WITH PROPOFOL N/A 06/29/2023   Procedure: COLONOSCOPY WITH PROPOFOL;  Surgeon: [name] Netty Starring., MD;  Location: [facility];  Service: Gastroenterology;  Laterality: N/A;   ENDOSCOPIC MUCOSAL RESECTION N/A 06/29/2023   Procedure: ENDOSCOPIC MUCOSAL RESECTION;  Surgeon: [name] Netty Starring., MD;  Location: [facility];  Service: Gastroenterology;  Laterality: N/A;   FLEXIBLE SIGMOIDOSCOPY N/A 09/04/2023   Procedure: FLEXIBLE SIGMOIDOSCOPY;  Surgeon: Andria Meuse, MD;  Location: [facility];  Service: General;  Laterality: N/A;   HEMOSTASIS CONTROL  06/29/2023   Procedure: HEMOSTASIS CONTROL;  Surgeon: Lemar Lofty., MD;  Location: [facility];  Service: Gastroenterology;;   IR IMAGING GUIDED PORT INSERTION  09/26/2023   IR RADIOLOGIST EVAL & MGMT  03/16/2023   PELVIC ABCESS DRAINAGE     March 2024   POLYPECTOMY  06/29/2023   Procedure: POLYPECTOMY;  Surgeon: [name] Netty Starring., MD;  Location: [facility];  Service: Gastroenterology;;   SUBMUCOSAL LIFTING INJECTION  06/29/2023   Procedure: SUBMUCOSAL LIFTING INJECTION;  Surgeon: Lemar Lofty., MD;  Location: [facility];  Service:  Gastroenterology;;   SUBMUCOSAL TATTOO INJECTION  06/29/2023   Procedure: SUBMUCOSAL TATTOO INJECTION;  Surgeon: Lemar Lofty., MD;  Location: [facility];  Service: Gastroenterology;;   TRANSURETHRAL RESECTION OF PROSTATE N/A 03/03/2023   Procedure: TRANSURETHRAL RESECTION OF THE PROSTATE (TURP)/ UNROOFING OF PROSTATE ABSCESS;  Surgeon: Riki Altes, MD;  Location: [facility];  Service: Urology;  Laterality: N/A;    SOCIAL HISTORY: Social History   Socioeconomic History   Marital status: Married    Spouse name: Editor, commissioning   Number of children: 1   Years of education: Not on file   Highest education level: Master's degree (e.g., MA, MS, MEng, MEd, MSW, MBA)  Occupational History   Not on file  Tobacco Use   Smoking status: Every Day    Current packs/day: 1.50    Average packs/day: 1.5 packs/day for 54.2 years (81.3 ttl pk-yrs)    Types: Cigarettes    Start date: 12   Smokeless tobacco: Never   Tobacco comments:    Reports that at one time he smoked 3 packs per day while in the [employer]  Vaping Use   Vaping status: Every Day   Start date: 08/08/2022   Substances: Flavoring  Substance and Sexual Activity   Alcohol use: Not Currently   Drug use: Never   Sexual activity: Not Currently    Birth control/protection: None  Other Topics Concern   Not on file  Social History Narrative   ** Merged History Encounter **       Retired from Holiday representative for 30 years    Social Drivers of Corporate investment banker Strain: Low Risk  (05/01/2023)   Overall Financial Resource Strain (CARDIA)    Difficulty of Paying Living Expenses: Not hard at all  Food Insecurity: No Food Insecurity (09/19/2023)   Hunger Vital Sign    Worried About Running Out of Food in the Last Year: Never true    Ran Out of Food in the Last Year: Never true  Transportation Needs: No Transportation Needs (09/19/2023)   PRAPARE - Administrator, Civil Service (Medical): No     Lack of Transportation (Non-Medical): No  Physical Activity: Sufficiently Active (05/01/2023)   Exercise Vital Sign    Days of Exercise per Week: 6 days    Minutes of Exercise per Session: 150+ min  Stress: No Stress Concern Present (05/01/2023)   [employer] of Occupational Health - Occupational Stress Questionnaire    Feeling of Stress : Not at all  Social Connections: Moderately Integrated (05/01/2023)   Social Connection and Isolation Panel [NHANES]    Frequency of Communication with Friends and Family: Three times a week    Frequency of Social Gatherings with Friends and Family: Twice a week    Attends Religious Services: More than 4 times per year    Active Member of [employer] or Organizations: No    Attends Banker Meetings: Patient declined    Marital Status: Married  Catering manager Violence: Not At Risk (09/19/2023)   Humiliation, Afraid, Rape, and Kick questionnaire    Fear of Current or Ex-Partner: No    Emotionally Abused: No    Physically Abused: No    Sexually Abused: No    FAMILY HISTORY: Family History  Problem Relation Age of Onset   Atrial fibrillation Mother    Skin cancer Father        non-melanoma   Colon cancer Sister 30  Lynch Syndrome   Breast cancer Sister 58   Diabetes Brother    Throat cancer Maternal Uncle    Breast cancer Paternal Aunt 36 - 36   Colon cancer Maternal Grandmother 5   Alcohol abuse Maternal Grandfather    Breast cancer Paternal Grandmother 51   Prostate cancer Paternal Grandfather 16 - 106   Other Niece        2 nieces have Lynch Syndrome (brother's daughters)   Diabetes Other    Ovarian cancer Other 98       paternal first cousin once removed   Colon polyps Neg Hx    Stomach cancer Neg Hx    Esophageal cancer Neg Hx    Inflammatory bowel disease Neg Hx    Liver disease Neg Hx    Pancreatic cancer Neg Hx     ALLERGIES:  has no known allergies.  MEDICATIONS:  Current Outpatient Medications  Medication  Sig Dispense Refill   albuterol (VENTOLIN HFA) 108 (90 Base) MCG/ACT inhaler Inhale 2 puffs into the lungs every 6 (six) hours as needed for wheezing or shortness of breath. 8 g 2   Continuous Glucose Sensor (FREESTYLE LIBRE 3 SENSOR) MISC PLACE 1 SENSOR ON THE SKIN EVERY 14 DAYS. USE TO CHECK GLUCOSE CONTINUOUSLY 2 each 6   Dulaglutide (TRULICITY) 0.75 MG/0.5ML SOAJ INJECT 0.75 MG SUBCUTANEOUSLY ONE TIME PER WEEK 6 mL 3   Fluticasone-Umeclidin-Vilant (TRELEGY ELLIPTA) 200-62.5-25 MCG/ACT AEPB Inhale 1 Act into the lungs daily. 1 each 6   Fluticasone-Umeclidin-Vilant (TRELEGY ELLIPTA) 200-62.5-25 MCG/ACT AEPB Inhale 1 Inhalation into the lungs daily in the afternoon. 1 each    lidocaine-prilocaine (EMLA) cream Apply 1 Application topically as needed. 30 g 1   metFORMIN (GLUCOPHAGE-XR) 500 MG 24 hr tablet TAKE 1 TABLET BY MOUTH EVERY DAY WITH BREAKFAST 90 tablet 1   ondansetron (ZOFRAN) 8 MG tablet One pill every 8 hours as needed for nausea/vomitting. 40 tablet 1   Vibegron (GEMTESA) 75 MG TABS Take 1 tablet (75 mg total) by mouth daily.     ferrous sulfate 325 (65 FE) MG tablet Take 1 tablet (325 mg total) by mouth 2 (two) times daily with a meal. 60 tablet 0   No current facility-administered medications for this visit.   Facility-Administered Medications Ordered in Other Visits  Medication Dose Route Frequency Provider Last Rate Last Admin   dexamethasone (DECADRON) injection 10 mg  10 mg Intravenous Once Louretta Shorten R, MD       dextrose 5 % solution   Intravenous Continuous Donneta Romberg, Worthy Flank, MD       fluorouracil (ADRUCIL) 5,000 mg in sodium chloride 0.9 % 150 mL chemo infusion  2,400 mg/m2 (Treatment Plan Recorded) Intravenous 1 day or 1 dose Earna Coder, MD       fluorouracil (ADRUCIL) chemo injection 800 mg  400 mg/m2 (Treatment Plan Recorded) Intravenous Once Earna Coder, MD       heparin lock flush 100 unit/mL  500 Units Intracatheter Once PRN Earna Coder, MD       leucovorin 796 mg in dextrose 5 % 250 mL infusion  400 mg/m2 (Treatment Plan Recorded) Intravenous Once Earna Coder, MD       oxaliplatin (ELOXATIN) 170 mg in dextrose 5 % 500 mL chemo infusion  85 mg/m2 (Treatment Plan Recorded) Intravenous Once Earna Coder, MD       palonosetron (ALOXI) injection 0.25 mg  0.25 mg Intravenous Once Earna Coder, MD  sodium chloride flush (NS) 0.9 % injection 10 mL  10 mL Intracatheter PRN Earna Coder, MD        PHYSICAL EXAMINATION:   Vitals:   02/19/24 0831  BP: 135/83  Pulse: 73  Resp: 16  Temp: 97.7 F (36.5 C)   Filed Weights   02/19/24 0831  Weight: 180 lb 9.6 oz (81.9 kg)    Physical Exam Vitals and nursing note reviewed.  HENT:     Head: Normocephalic and atraumatic.     Mouth/Throat:     Pharynx: Oropharynx is clear.  Eyes:     Extraocular Movements: Extraocular movements intact.     Pupils: Pupils are equal, round, and reactive to light.  Cardiovascular:     Rate and Rhythm: Normal rate and regular rhythm.  Pulmonary:     Comments: Decreased breath sounds bilaterally.  Abdominal:     Palpations: Abdomen is soft.  Musculoskeletal:        General: Normal range of motion.     Cervical back: Normal range of motion.  Skin:    General: Skin is warm.  Neurological:     General: No focal deficit present.     Mental Status: He is alert and oriented to person, place, and time.  Psychiatric:        Behavior: Behavior normal.        Judgment: Judgment normal.     LABORATORY DATA:  I have reviewed the data as listed Lab Results  Component Value Date   WBC 7.5 02/19/2024   HGB 10.5 (L) 02/19/2024   HCT 31.2 (L) 02/19/2024   MCV 102.3 (H) 02/19/2024   PLT 152 02/19/2024   Recent Labs    03/01/23 1954 03/02/23 0504 01/22/24 0821 02/05/24 0814 02/19/24 0808  NA  --    < > 137 137 137  K  --    < > 3.4* 3.6 3.6  CL  --    < > 109 105 107  CO2  --    < > 19*  22 21*  GLUCOSE  --    < > 191* 197* 208*  BUN  --    < > 19 31* 15  CREATININE  --    < > 0.95 1.34* 1.13  CALCIUM  --    < > 8.9 9.0 8.8*  GFRNONAA  --    < > >60 57* >60  PROT 8.4*   < > 6.3* 6.9 6.5  ALBUMIN 3.2*   < > 3.4* 3.8 3.4*  AST 40   < > 20 21 19   ALT 48*   < > 15 19 15   ALKPHOS 186*   < > 50 57 55  BILITOT 0.8   < > 0.5 0.9 0.5  BILIDIR 0.2  --   --   --   --   IBILI 0.6  --   --   --   --    < > = values in this interval not displayed.    RADIOGRAPHIC STUDIES: I have personally reviewed the radiological images as listed and agreed with the findings in the report. No results found.   Cancer of descending colon (HCC) # OCT 2024-Colon cancer, status post a partial left colectomy 09/04/2023, stage IIIa (pT2 pN1b)-negative resection margins, lymphovascular invasion present, 3/11 lymph nodes, 2 tumor deposits. [Drs.Sherrill; White;Masarouty]. mismatch repair protein expression intact. FOLFOX chemotherapy is given every 2 weeks. Awaiting colonoscopy < 1 year.    #  Proceed with chemo FOLFOX #  9 of planned 12.   Labs-CBC/chemistries were reviewed with the patient- Stable.   # Thrombocytopenia- > 100- sec to chemo- monitor ro for now. Stable.   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 Stable.    # COPD/ albuterol prn; s/p steroids [Dr.Purlmonary] on inhalers- stable.   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 UTI- on cipro- [Ms.McGowan]- entercoccus fecalis- s/p Amoxcicillin 500 mg TID- Resolved- monitor for now.  # Diabetes- Trulicity/metformin- CGM-monitor closely on chemotherapy/steroids- stable  # IDA- Hb 9-10- ferritin- on PO iron; hold off venofer for now. NOv 2024- ferritni-98; sat- 30%-  stable  # Acitive smoker: Recommend quitting smoking.  Continue lung cancer screening-   # IV access port   #  DISPOSITION:  # IV access  # Chemo today- FOLFOX-chemo today; cancel- d-3 pump off;   # follow up in 2 weeks- MD; port/labs- cbc/cmp; FOLFOX; d-3 pump off;    # follow up in 4  weeks- MD; port/labs-- cbc/cmp; FOLFOX;possible   d-3 pump off;  Dr.B   Above plan of care was discussed with patient/family in detail.  My contact information was given to the patient/family.     Earna Coder, MD 02/19/2024 9:09 AM

## 2024-02-20 ENCOUNTER — Other Ambulatory Visit: Payer: Self-pay

## 2024-02-20 ENCOUNTER — Encounter: Payer: Self-pay | Admitting: Urology

## 2024-02-20 ENCOUNTER — Ambulatory Visit: Payer: Medicare PPO | Admitting: Urology

## 2024-02-20 VITALS — BP 149/80 | HR 87 | Ht 73.0 in | Wt 180.0 lb

## 2024-02-20 DIAGNOSIS — R32 Unspecified urinary incontinence: Secondary | ICD-10-CM | POA: Diagnosis not present

## 2024-02-20 DIAGNOSIS — R3915 Urgency of urination: Secondary | ICD-10-CM | POA: Diagnosis not present

## 2024-02-20 DIAGNOSIS — C186 Malignant neoplasm of descending colon: Secondary | ICD-10-CM

## 2024-02-20 DIAGNOSIS — R3129 Other microscopic hematuria: Secondary | ICD-10-CM

## 2024-02-20 DIAGNOSIS — C61 Malignant neoplasm of prostate: Secondary | ICD-10-CM

## 2024-02-20 LAB — URINALYSIS, COMPLETE
Bilirubin, UA: NEGATIVE
Ketones, UA: NEGATIVE
Leukocytes,UA: NEGATIVE
Nitrite, UA: NEGATIVE
RBC, UA: NEGATIVE
Specific Gravity, UA: 1.025 (ref 1.005–1.030)
Urobilinogen, Ur: 0.2 mg/dL (ref 0.2–1.0)
pH, UA: 6 (ref 5.0–7.5)

## 2024-02-20 LAB — MICROSCOPIC EXAMINATION: Bacteria, UA: NONE SEEN

## 2024-02-20 LAB — BLADDER SCAN AMB NON-IMAGING

## 2024-02-21 ENCOUNTER — Inpatient Hospital Stay

## 2024-02-21 VITALS — BP 143/75 | HR 83 | Temp 97.5°F | Resp 16

## 2024-02-21 DIAGNOSIS — Z5111 Encounter for antineoplastic chemotherapy: Secondary | ICD-10-CM | POA: Diagnosis not present

## 2024-02-21 DIAGNOSIS — C186 Malignant neoplasm of descending colon: Secondary | ICD-10-CM

## 2024-02-21 MED ORDER — HEPARIN SOD (PORK) LOCK FLUSH 100 UNIT/ML IV SOLN
500.0000 [IU] | Freq: Once | INTRAVENOUS | Status: AC | PRN
Start: 2024-02-21 — End: 2024-02-21
  Administered 2024-02-21: 500 [IU]
  Filled 2024-02-21: qty 5

## 2024-02-21 MED ORDER — SODIUM CHLORIDE 0.9% FLUSH
10.0000 mL | INTRAVENOUS | Status: DC | PRN
  Administered 2024-02-21: 10 mL
  Filled 2024-02-21: qty 10

## 2024-02-21 NOTE — Patient Instructions (Signed)

## 2024-02-29 ENCOUNTER — Telehealth: Payer: Self-pay | Admitting: Internal Medicine

## 2024-02-29 NOTE — Telephone Encounter (Signed)
 Copied from CRM [id number]. Topic: Clinical - Medication Question >> Feb 29, 2024 11:37 AM Payton Doughty wrote: Reason for CRM: pt states he was supposed to get an O2 overnight reader and he never received.  It wa supposed to come in the mail, and it never did.

## 2024-02-29 NOTE — Telephone Encounter (Signed)
 I received a message from [place] with [employer] [facility]   I have messaged the [place] office to ask for an update.

## 2024-02-29 NOTE — Telephone Encounter (Signed)
 I have sent urgent message to [employer] about this issue

## 2024-03-01 ENCOUNTER — Ambulatory Visit: Payer: Self-pay | Admitting: Family Medicine

## 2024-03-04 ENCOUNTER — Inpatient Hospital Stay: Admitting: Internal Medicine

## 2024-03-04 ENCOUNTER — Inpatient Hospital Stay: Attending: Oncology

## 2024-03-04 ENCOUNTER — Inpatient Hospital Stay

## 2024-03-04 ENCOUNTER — Encounter: Payer: Self-pay | Admitting: Internal Medicine

## 2024-03-04 VITALS — BP 129/80 | HR 77 | Temp 97.5°F | Resp 16 | Wt 178.0 lb

## 2024-03-04 DIAGNOSIS — Z8546 Personal history of malignant neoplasm of prostate: Secondary | ICD-10-CM | POA: Insufficient documentation

## 2024-03-04 DIAGNOSIS — F17210 Nicotine dependence, cigarettes, uncomplicated: Secondary | ICD-10-CM | POA: Insufficient documentation

## 2024-03-04 DIAGNOSIS — Z9079 Acquired absence of other genital organ(s): Secondary | ICD-10-CM | POA: Insufficient documentation

## 2024-03-04 DIAGNOSIS — C186 Malignant neoplasm of descending colon: Secondary | ICD-10-CM | POA: Insufficient documentation

## 2024-03-04 DIAGNOSIS — Z7984 Long term (current) use of oral hypoglycemic drugs: Secondary | ICD-10-CM | POA: Diagnosis not present

## 2024-03-04 DIAGNOSIS — Z7985 Long-term (current) use of injectable non-insulin antidiabetic drugs: Secondary | ICD-10-CM | POA: Diagnosis not present

## 2024-03-04 DIAGNOSIS — E119 Type 2 diabetes mellitus without complications: Secondary | ICD-10-CM | POA: Diagnosis not present

## 2024-03-04 DIAGNOSIS — Z5111 Encounter for antineoplastic chemotherapy: Secondary | ICD-10-CM | POA: Insufficient documentation

## 2024-03-04 LAB — CMP (CANCER CENTER ONLY)
ALT: 12 U/L (ref 0–44)
AST: 17 U/L (ref 15–41)
Albumin: 3.6 g/dL (ref 3.5–5.0)
Alkaline Phosphatase: 80 U/L (ref 38–126)
Anion gap: 4 — ABNORMAL LOW (ref 5–15)
BUN: 19 mg/dL (ref 8–23)
CO2: 20 mmol/L — ABNORMAL LOW (ref 22–32)
Calcium: 8.8 mg/dL — ABNORMAL LOW (ref 8.9–10.3)
Chloride: 109 mmol/L (ref 98–111)
Creatinine: 1.04 mg/dL (ref 0.61–1.24)
GFR, Estimated: >60 mL/min (ref >60)
Glucose, Bld: 179 mg/dL — ABNORMAL HIGH (ref 70–99)
Potassium: 3.5 mmol/L (ref 3.5–5.1)
Sodium: 133 mmol/L — ABNORMAL LOW (ref 135–145)
Total Bilirubin: 0.5 mg/dL (ref 0.0–1.2)
Total Protein: 6.7 g/dL (ref 6.5–8.1)

## 2024-03-04 LAB — CBC WITH DIFFERENTIAL (CANCER CENTER ONLY)
Abs Immature Granulocytes: 0.02 10*3/uL (ref 0.00–0.07)
Basophils Absolute: 0 10*3/uL (ref 0.0–0.1)
Basophils Relative: 0 %
Eosinophils Absolute: 0.5 10*3/uL (ref 0.0–0.5)
Eosinophils Relative: 8 %
HCT: 31.4 % — ABNORMAL LOW (ref 39.0–52.0)
Hemoglobin: 10.9 g/dL — ABNORMAL LOW (ref 13.0–17.0)
Immature Granulocytes: 0 %
Lymphocytes Relative: 22 %
Lymphs Abs: 1.2 10*3/uL (ref 0.7–4.0)
MCH: 34.8 pg — ABNORMAL HIGH (ref 26.0–34.0)
MCHC: 34.7 g/dL (ref 30.0–36.0)
MCV: 100.3 fL — ABNORMAL HIGH (ref 80.0–100.0)
Monocytes Absolute: 0.8 10*3/uL (ref 0.1–1.0)
Monocytes Relative: 14 %
Neutro Abs: 2.9 10*3/uL (ref 1.7–7.7)
Neutrophils Relative %: 56 %
Platelet Count: 152 10*3/uL (ref 150–400)
RBC: 3.13 MIL/uL — ABNORMAL LOW (ref 4.22–5.81)
RDW: 12.6 % (ref 11.5–15.5)
WBC Count: 5.3 10*3/uL (ref 4.0–10.5)
nRBC: 0 % (ref 0.0–0.2)

## 2024-03-04 MED ORDER — FLUOROURACIL CHEMO INJECTION 2.5 GM/50ML
400.0000 mg/m2 | Freq: Once | INTRAVENOUS | Status: AC
  Administered 2024-03-04: 800 mg via INTRAVENOUS
  Filled 2024-03-04: qty 16

## 2024-03-04 MED ORDER — PALONOSETRON HCL INJECTION 0.25 MG/5ML
0.2500 mg | Freq: Once | INTRAVENOUS | Status: AC
Start: 2024-03-04 — End: 2024-03-04
  Administered 2024-03-04: 0.25 mg via INTRAVENOUS
  Filled 2024-03-04: qty 5

## 2024-03-04 MED ORDER — DEXAMETHASONE SODIUM PHOSPHATE 10 MG/ML IJ SOLN
10.0000 mg | Freq: Once | INTRAMUSCULAR | Status: AC
  Administered 2024-03-04: 10 mg via INTRAVENOUS
  Filled 2024-03-04: qty 1

## 2024-03-04 MED ORDER — OXALIPLATIN CHEMO INJECTION 100 MG/20ML
85.0000 mg/m2 | Freq: Once | INTRAVENOUS | Status: AC
  Administered 2024-03-04: 170 mg via INTRAVENOUS
  Filled 2024-03-04: qty 14

## 2024-03-04 MED ORDER — SODIUM CHLORIDE 0.9 % IV SOLN
2400.0000 mg/m2 | INTRAVENOUS | Status: AC
  Administered 2024-03-04: 5000 mg via INTRAVENOUS
  Filled 2024-03-04: qty 100

## 2024-03-04 MED ORDER — DEXTROSE 5 % IV SOLN
400.0000 mg/m2 | Freq: Once | INTRAVENOUS | Status: AC
  Administered 2024-03-04: 796 mg via INTRAVENOUS
  Filled 2024-03-04: qty 22.3

## 2024-03-04 MED ORDER — DEXTROSE 5 % IV SOLN
INTRAVENOUS | Status: DC
  Filled 2024-03-04 (×2): qty 250

## 2024-03-04 NOTE — Patient Instructions (Signed)
 [facility] - A DEPT OF [facility]  Discharge Instructions: Thank you for choosing [facility] to provide your oncology and hematology care.  If you have a lab appointment with the Cancer Center, please go directly to the Cancer Center and check in at the registration area.  Wear comfortable clothing and clothing appropriate for easy access to any Portacath or PICC line.   We strive to give you quality time with your provider. You may need to reschedule your appointment if you arrive late (15 or more minutes).  Arriving late affects you and other patients whose appointments are after yours.  Also, if you miss three or more appointments without notifying the office, you may be dismissed from the clinic at the provider's discretion.      For prescription refill requests, have your pharmacy contact our office and allow 72 hours for refills to be completed.    Today you received the following chemotherapy and/or immunotherapy agents Adrucil, Leucovorin and Oxaliplatin.      To help prevent nausea and vomiting after your treatment, we encourage you to take your nausea medication as directed.  BELOW ARE SYMPTOMS THAT SHOULD BE REPORTED IMMEDIATELY: *FEVER GREATER THAN 100.4 F (38 C) OR HIGHER *CHILLS OR SWEATING *NAUSEA AND VOMITING THAT IS NOT CONTROLLED WITH YOUR NAUSEA MEDICATION *UNUSUAL SHORTNESS OF BREATH *UNUSUAL BRUISING OR BLEEDING *URINARY PROBLEMS (pain or burning when urinating, or frequent urination) *BOWEL PROBLEMS (unusual diarrhea, constipation, pain near the anus) TENDERNESS IN MOUTH AND THROAT WITH OR WITHOUT PRESENCE OF ULCERS (sore throat, sores in mouth, or a toothache) UNUSUAL RASH, SWELLING OR PAIN  UNUSUAL VAGINAL DISCHARGE OR ITCHING   Items with * indicate a potential emergency and should be followed up as soon as possible or go to the Emergency Department if any problems should occur.  Please show the CHEMOTHERAPY  ALERT CARD or IMMUNOTHERAPY ALERT CARD at check-in to the Emergency Department and triage nurse.  Should you have questions after your visit or need to cancel or reschedule your appointment, please contact [facility] - A DEPT OF [facility]  [phone] and follow the prompts.  Office hours are 8:00 a.m. to 4:30 p.m. Monday - Friday. Please note that voicemails left after 4:00 p.m. may not be returned until the following business day.  We are closed weekends and major holidays. You have access to a nurse at all times for urgent questions. Please call the main number to the clinic [phone] and follow the prompts.  For any non-urgent questions, you may also contact your provider using MyChart. We now offer e-Visits for anyone 61 and older to request care online for non-urgent symptoms. For details visit [email or web address].   Also download the MyChart app! Go to the app store, search "MyChart", open the app, select [place], and log in with your MyChart username and password.

## 2024-03-04 NOTE — Progress Notes (Signed)
 Patient is doing well, no new questions or concerns for the doctor today. He told me that his urination issue has got better. Patient will be out of town on 03/31/2024.

## 2024-03-04 NOTE — Assessment & Plan Note (Addendum)
#   OCT 2024-Colon cancer, status post a partial left colectomy 09/04/2023, stage IIIa (pT2 pN1b)-negative resection margins, lymphovascular invasion present, 3/11 lymph nodes, 2 tumor deposits. [Drs.Sherrill; White;Masarouty]. mismatch repair protein expression intact. FOLFOX chemotherapy is given every 2 weeks. Awaiting colonoscopy < 1 year.    #  Proceed with chemo FOLFOX # 10 of planned 12.   Labs-CBC/chemistries were reviewed with the patient-Stable.   # Thrombocytopenia- > 100- sec to chemo- monitor ro for now. Stable.   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 Stable.    # COPD/ albuterol prn; s/p steroids [Dr.Purlmonary] on inhalers- Stable.   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 UTI- on cipro- [Ms.McGowan]- entercoccus fecalis- s/p Amoxcicillin 500 mg TID-Stable/improved.   # Diabetes- Trulicity/metformin- CGM-monitor closely on chemotherapy/steroids- stable  # IDA- Hb 9-10- ferritin- on PO iron; hold off venofer for now. NOv 2024- ferritni-98; sat- 30%-  stable  # Acitive smoker: Recommend quitting smoking.  Continue lung cancer screening-   # IV access port  Last [place] 1 [place]  # DISPOSITION:   # Chemo today- FOLFOX-chemo today; cancel- d-3 pump off;   # follow up in 2 weeks- MD; port/labs- cbc/cmp; FOLFOX; d-3 pump off;    # follow up in 5  weeks- MD; port/labs-- cbc/cmp; FOLFOX;possible   d-3 pump off;  Dr.B

## 2024-03-04 NOTE — Progress Notes (Signed)
 [facility] CONSULT NOTE  Patient Care Team: Ronnald Ramp, MD as PCP - General (Family Medicine) Benita Gutter, RN as Oncology Nurse Navigator Earna Coder, MD as Consulting Physician (Oncology)  CHIEF COMPLAINTS/PURPOSE OF CONSULTATION: Colon cancer  Oncology History Overview Note     Colon cancer, status post a partial left colectomy 09/04/2023, stage IIIa (pT2 pN1b) CT abdomen/pelvis 03/01/2023-prostate/pelvic abscess, large amount of colonic stool CT chest 03/30/2023-bilateral solid pulmonary nodules-likely benign, 55-month follow-up CT recommended Colonoscopy 04/12/2023-multiple polyps removed, greater than 50 mm polyp in the distal ascending colon and sigmoid colon not removed Colonoscopy 06/29/2023 greater than 50 mm polyps removed from the ascending, transverse, and descending colon-tubular adenomas, sigmoid lesion at 35 cm incompletely resected with biopsy revealing an ulcerated tubular adenoma with at least high-grade dysplasia and suspicious for invasive or [place] Descending colon resection 09/04/2023, pT2, PN 1B moderately differentiated adenocarcinoma, negative resection margins, lymphovascular invasion present, 3/11 lymph nodes, 2 tumor deposits, mismatch repair protein expression intact, MSS  Multiple colon polyps on colonoscopy 04/12/2023 and 06/29/2023-tubular adenomas Prostate cancer-Gleason 7 acinar adenocarcinoma involving 5-10% of submitted tissue from a TUR specimen 03/03/2023 Prostate 03/20/2023-mild prostamegaly- PI-RADS category 5 lesion in the left peripheral zone and category 4 lesion in the left anterior peripheral zone and left anterior fibromuscular stroma, reduced size of prostate abscess with continued surrounding prostatitis and periprostatic stranding IMRT 20 6/24 - 07/20/2023  4.  Diabetes 5.  History of colon cancer-negative CancerNext expanded panel 08/29/2023 6.  History of lung nodules, ongoing tobacco use, followed in the lung  cancer screening clinic# prostate/pelvic abscess.  He was admitted for antibiotics and drainage of the pelvic abscess 03/01/2023. A TUR and was found to have a Gleason 7 prostate cancer involving 5-10% of the submitted tissue.  He was referred to Dr. Rushie Chestnut and completed external beam radiation to the prostate.  # MAY 2024 [screening colo]- A greater than 50 mm polyp was removed from the proximal ascending colon.  Additional greater than 50 m polyps were not removed from the distal ascending and sigmoid colon.  Additional smaller polyps were removed from the colon. The pathology revealed multiple fragments of tubular adenomas. Dr. Baird Lyons than 50 mm polyps were removed from the ascending, transverse, and descending colon.  A polypoid lesion at 35 cm was incompletely resected and tattooed.  The pathology from multiple polyps returned as tubular adenomas without high-grade dysplasia or carcinoma.  # OCTOBER 2024- STAGE III SIGMOID COLON CANCER [Dr.Sherril/Dr.White- [place]] pathology from the sigmoid resection returned as invasive moderately differentiated adenocarcinoma involving the descending colon.  Carcinoma invades into but not through the muscularis propria.  The resection margins are negative.  Lymphovascular invasion is present.  Metastatic carcinoma was identified in 3 of 11 lymph nodes and there were 2 tumor deposits.  Mismatch repair protein expression is intact.   # Multiple colon polyps on colonoscopy 04/12/2023 and 06/29/2023-tubular adenomas- s/p  Genetics counseling: negative CancerNext expanded panel 08/29/2023  # NOV 4th, 2024- FOLFOX q 2 w x 6 months.   # APRIL 2024-  Prostate cancer [s/p EBRT- Dr.Chrystal]-    Cancer of descending colon (HCC)  09/19/2023 Initial Diagnosis   Cancer of descending colon (HCC)   09/19/2023 Cancer Staging   Staging form: Colon and Rectum, AJCC 8th Edition - Pathologic: pT2, cM0 - Signed by Ladene Artist, MD on 09/19/2023 Histologic grading  system: 4 grade system Histologic grade (G): G2 Residual tumor (R): R0 - None Laterality: Left Lymph-vascular invasion (LVI): LVI  present/identified, NOS Tumor deposits (TD): Present Perineural invasion (PNI): Absent Microsatellite instability (MSI): Stable   09/19/2023 Cancer Staging   Staging form: Colon and Rectum, AJCC 8th Edition - Pathologic: Stage IIIA (pT2, pN1b, cM0) - Signed by Ladene Artist, MD on 09/19/2023   10/11/2023 -  Chemotherapy   Patient is on Treatment Plan : COLORECTAL FOLFOX q14d x 6 months      HISTORY OF PRESENTING ILLNESS: Patient ambulating-independently.  Alone.  Colin Griffin 73 y.o.  male pleasant patient with  stage III colon cancer s/p left hemicolectomy currently on adjuvant chemotherapy with FOLFOX is here for a follow up.  Patient is doing well, no new questions or concerns for the doctor today. He told me that his urination issue has got better. Patient will be out of town on 03/31/2024    Cough improved after the inhalers.  Mild tingling and numbness of the chemotherapy in his hands.    Denies any blood in stools or black-colored stools.  No nausea no vomiting.   Review of Systems  Constitutional:  Negative for chills, diaphoresis, fever, malaise/fatigue and weight loss.  HENT:  Negative for nosebleeds and sore throat.   Eyes:  Negative for double vision.  Respiratory:  Positive for cough. Negative for hemoptysis, sputum production, shortness of breath and wheezing.   Cardiovascular:  Negative for chest pain, palpitations, orthopnea and leg swelling.  Gastrointestinal:  Positive for nausea. Negative for abdominal pain, blood in stool, constipation, diarrhea, heartburn, melena and vomiting.  Genitourinary:  Negative for dysuria, frequency and urgency.  Musculoskeletal:  Negative for back pain and joint pain.  Skin: Negative.  Negative for itching and rash.  Neurological:  Negative for dizziness, tingling, focal weakness, weakness and  headaches.  Endo/Heme/Allergies:  Does not bruise/bleed easily.  Psychiatric/Behavioral:  Negative for depression. The patient is not nervous/anxious and does not have insomnia.     MEDICAL HISTORY:  Past Medical History:  Diagnosis Date   Cancer [facility]) April 2024   Constipation 03/04/2023   Diabetes mellitus without complication [facility]) March 2024    SURGICAL HISTORY: Past Surgical History:  Procedure Laterality Date   APPENDECTOMY     BIOPSY  06/29/2023   Procedure: BIOPSY;  Surgeon: Lemar Lofty., MD;  Location: [facility];  Service: Gastroenterology;;   COLONOSCOPY WITH PROPOFOL N/A 04/12/2023   Procedure: COLONOSCOPY WITH PROPOFOL;  Surgeon: [name], MD;  Location: [facility];  Service: Gastroenterology;  Laterality: N/A;   COLONOSCOPY WITH PROPOFOL N/A 06/29/2023   Procedure: COLONOSCOPY WITH PROPOFOL;  Surgeon: [name] Netty Starring., MD;  Location: [facility];  Service: Gastroenterology;  Laterality: N/A;   ENDOSCOPIC MUCOSAL RESECTION N/A 06/29/2023   Procedure: ENDOSCOPIC MUCOSAL RESECTION;  Surgeon: [name] Netty Starring., MD;  Location: [facility];  Service: Gastroenterology;  Laterality: N/A;   FLEXIBLE SIGMOIDOSCOPY N/A 09/04/2023   Procedure: FLEXIBLE SIGMOIDOSCOPY;  Surgeon: Andria Meuse, MD;  Location: [facility];  Service: General;  Laterality: N/A;   HEMOSTASIS CONTROL  06/29/2023   Procedure: HEMOSTASIS CONTROL;  Surgeon: Lemar Lofty., MD;  Location: [facility];  Service: Gastroenterology;;   IR IMAGING GUIDED PORT INSERTION  09/26/2023   IR RADIOLOGIST EVAL & MGMT  03/16/2023   PELVIC ABCESS DRAINAGE     March 2024   POLYPECTOMY  06/29/2023   Procedure: POLYPECTOMY;  Surgeon: [name] Netty Starring., MD;  Location: [facility];  Service: Gastroenterology;;   SUBMUCOSAL LIFTING INJECTION  06/29/2023   Procedure: SUBMUCOSAL LIFTING INJECTION;  Surgeon: Lemar Lofty.,  MD;  Location: [facility];  Service:  Gastroenterology;;   SUBMUCOSAL TATTOO INJECTION  06/29/2023   Procedure: SUBMUCOSAL TATTOO INJECTION;  Surgeon: Lemar Lofty., MD;  Location: [facility];  Service: Gastroenterology;;   TRANSURETHRAL RESECTION OF PROSTATE N/A 03/03/2023   Procedure: TRANSURETHRAL RESECTION OF THE PROSTATE (TURP)/ UNROOFING OF PROSTATE ABSCESS;  Surgeon: Riki Altes, MD;  Location: [facility] ORS;  Service: Urology;  Laterality: N/A;    SOCIAL HISTORY: Social History   Socioeconomic History   Marital status: Married    Spouse name: Editor, commissioning   Number of children: 1   Years of education: Not on file   Highest education level: Master's degree (e.g., MA, MS, MEng, MEd, MSW, MBA)  Occupational History   Not on file  Tobacco Use   Smoking status: Every Day    Current packs/day: 1.50    Average packs/day: 1.5 packs/day for 54.3 years (81.4 ttl pk-yrs)    Types: Cigarettes    Start date: 77   Smokeless tobacco: Never   Tobacco comments:    Reports that at one time he smoked 3 packs per day while in the [employer]  Vaping Use   Vaping status: Every Day   Start date: 08/08/2022   Substances: Flavoring  Substance and Sexual Activity   Alcohol use: Not Currently   Drug use: Never   Sexual activity: Not Currently    Birth control/protection: None  Other Topics Concern   Not on file  Social History Narrative   ** Merged History Encounter **       Retired from Holiday representative for 30 years    Social Drivers of Corporate investment banker Strain: Low Risk  (05/01/2023)   Overall Financial Resource Strain (CARDIA)    Difficulty of Paying Living Expenses: Not hard at all  Food Insecurity: No Food Insecurity (09/19/2023)   Hunger Vital Sign    Worried About Running Out of Food in the Last Year: Never true    Ran Out of Food in the Last Year: Never true  Transportation Needs: No Transportation Needs (09/19/2023)   PRAPARE - Administrator, Civil Service (Medical): No     Lack of Transportation (Non-Medical): No  Physical Activity: Sufficiently Active (05/01/2023)   Exercise Vital Sign    Days of Exercise per Week: 6 days    Minutes of Exercise per Session: 150+ min  Stress: No Stress Concern Present (05/01/2023)   [employer] of Occupational Health - Occupational Stress Questionnaire    Feeling of Stress : Not at all  Social Connections: Moderately Integrated (05/01/2023)   Social Connection and Isolation Panel [NHANES]    Frequency of Communication with Friends and Family: Three times a week    Frequency of Social Gatherings with Friends and Family: Twice a week    Attends Religious Services: More than 4 times per year    Active Member of [employer] or Organizations: No    Attends Banker Meetings: Patient declined    Marital Status: Married  Catering manager Violence: Not At Risk (09/19/2023)   Humiliation, Afraid, Rape, and Kick questionnaire    Fear of Current or Ex-Partner: No    Emotionally Abused: No    Physically Abused: No    Sexually Abused: No    FAMILY HISTORY: Family History  Problem Relation Age of Onset   Atrial fibrillation Mother    Skin cancer Father        non-melanoma   Colon cancer Sister 49  Lynch Syndrome   Breast cancer Sister 2   Diabetes Brother    Throat cancer Maternal Uncle    Breast cancer Paternal Aunt 33 - 36   Colon cancer Maternal Grandmother 56   Alcohol abuse Maternal Grandfather    Breast cancer Paternal Grandmother 7   Prostate cancer Paternal Grandfather 19 - 74   Other Niece        2 nieces have Lynch Syndrome (brother's daughters)   Diabetes Other    Ovarian cancer Other 25       paternal first cousin once removed   Colon polyps Neg Hx    Stomach cancer Neg Hx    Esophageal cancer Neg Hx    Inflammatory bowel disease Neg Hx    Liver disease Neg Hx    Pancreatic cancer Neg Hx     ALLERGIES:  has no known allergies.  MEDICATIONS:  Current Outpatient Medications  Medication  Sig Dispense Refill   albuterol (VENTOLIN HFA) 108 (90 Base) MCG/ACT inhaler Inhale 2 puffs into the lungs every 6 (six) hours as needed for wheezing or shortness of breath. 8 g 2   Continuous Glucose Sensor (FREESTYLE LIBRE 3 SENSOR) MISC PLACE 1 SENSOR ON THE SKIN EVERY 14 DAYS. USE TO CHECK GLUCOSE CONTINUOUSLY 2 each 6   Dulaglutide (TRULICITY) 0.75 MG/0.5ML SOAJ INJECT 0.75 MG SUBCUTANEOUSLY ONE TIME PER WEEK 6 mL 3   ferrous sulfate 325 (65 FE) MG tablet Take 1 tablet (325 mg total) by mouth 2 (two) times daily with a meal. 60 tablet 0   Fluticasone-Umeclidin-Vilant (TRELEGY ELLIPTA) 200-62.5-25 MCG/ACT AEPB Inhale 1 Act into the lungs daily. 1 each 6   lidocaine-prilocaine (EMLA) cream Apply 1 Application topically as needed. 30 g 1   metFORMIN (GLUCOPHAGE-XR) 500 MG 24 hr tablet TAKE 1 TABLET BY MOUTH EVERY DAY WITH BREAKFAST 90 tablet 1   ondansetron (ZOFRAN) 8 MG tablet One pill every 8 hours as needed for nausea/vomitting. 40 tablet 1   Vibegron (GEMTESA) 75 MG TABS Take 1 tablet (75 mg total) by mouth daily.     No current facility-administered medications for this visit.   Facility-Administered Medications Ordered in Other Visits  Medication Dose Route Frequency Provider Last Rate Last Admin   dextrose 5 % solution   Intravenous Continuous Earna Coder, MD 10 mL/hr at 03/04/24 0923 New Bag at 03/04/24 0923   fluorouracil (ADRUCIL) 5,000 mg in sodium chloride 0.9 % 150 mL chemo infusion  2,400 mg/m2 (Treatment Plan Recorded) Intravenous 1 day or 1 dose Earna Coder, MD       fluorouracil (ADRUCIL) chemo injection 800 mg  400 mg/m2 (Treatment Plan Recorded) Intravenous Once Earna Coder, MD       leucovorin 796 mg in dextrose 5 % 250 mL infusion  400 mg/m2 (Treatment Plan Recorded) Intravenous Once Earna Coder, MD       oxaliplatin (ELOXATIN) 170 mg in dextrose 5 % 500 mL chemo infusion  85 mg/m2 (Treatment Plan Recorded) Intravenous Once Earna Coder, MD        PHYSICAL EXAMINATION:   Vitals:   03/04/24 0837  BP: 129/80  Pulse: 77  Resp: 16  Temp: (!) 97.5 F (36.4 C)  SpO2: 100%   Filed Weights   03/04/24 0837  Weight: 178 lb (80.7 kg)    Physical Exam Vitals and nursing note reviewed.  HENT:     Head: Normocephalic and atraumatic.     Mouth/Throat:     Pharynx:  Oropharynx is clear.  Eyes:     Extraocular Movements: Extraocular movements intact.     Pupils: Pupils are equal, round, and reactive to light.  Cardiovascular:     Rate and Rhythm: Normal rate and regular rhythm.  Pulmonary:     Comments: Decreased breath sounds bilaterally.  Abdominal:     Palpations: Abdomen is soft.  Musculoskeletal:        General: Normal range of motion.     Cervical back: Normal range of motion.  Skin:    General: Skin is warm.  Neurological:     General: No focal deficit present.     Mental Status: He is alert and oriented to person, place, and time.  Psychiatric:        Behavior: Behavior normal.        Judgment: Judgment normal.     LABORATORY DATA:  I have reviewed the data as listed Lab Results  Component Value Date   WBC 5.3 03/04/2024   HGB 10.9 (L) 03/04/2024   HCT 31.4 (L) 03/04/2024   MCV 100.3 (H) 03/04/2024   PLT 152 03/04/2024   Recent Labs    02/05/24 0814 02/19/24 0808 03/04/24 0823  NA 137 137 133*  K 3.6 3.6 3.5  CL 105 107 109  CO2 22 21* 20*  GLUCOSE 197* 208* 179*  BUN 31* 15 19  CREATININE 1.34* 1.13 1.04  CALCIUM 9.0 8.8* 8.8*  GFRNONAA 57* >60 >60  PROT 6.9 6.5 6.7  ALBUMIN 3.8 3.4* 3.6  AST 21 19 17   ALT 19 15 12   ALKPHOS 57 55 80  BILITOT 0.9 0.5 0.5    RADIOGRAPHIC STUDIES: I have personally reviewed the radiological images as listed and agreed with the findings in the report. No results found.   Cancer of descending colon (HCC) # OCT 2024-Colon cancer, status post a partial left colectomy 09/04/2023, stage IIIa (pT2 pN1b)-negative resection margins,  lymphovascular invasion present, 3/11 lymph nodes, 2 tumor deposits. [Drs.Sherrill; White;Masarouty]. mismatch repair protein expression intact. FOLFOX chemotherapy is given every 2 weeks. Awaiting colonoscopy < 1 year.    #  Proceed with chemo FOLFOX # 10 of planned 12.   Labs-CBC/chemistries were reviewed with the patient-Stable.   # Thrombocytopenia- > 100- sec to chemo- monitor ro for now. Stable.   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 Stable.    # COPD/ albuterol prn; s/p steroids [Dr.Purlmonary] on inhalers- Stable.   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 UTI- on cipro- [Ms.McGowan]- entercoccus fecalis- s/p Amoxcicillin 500 mg TID-Stable/improved.   # Diabetes- Trulicity/metformin- CGM-monitor closely on chemotherapy/steroids- stable  # IDA- Hb 9-10- ferritin- on PO iron; hold off venofer for now. NOv 2024- ferritni-98; sat- 30%-  stable  # Acitive smoker: Recommend quitting smoking.  Continue lung cancer screening-   # IV access port  Last cycle -[place]oved [place] 1 [place]  # DISPOSITION:   # Chemo today- FOLFOX-chemo today; cancel- d-3 pump off;   # follow up in 2 weeks- MD; port/labs- cbc/cmp; FOLFOX; d-3 pump off;    # follow up in 5  weeks- MD; port/labs-- cbc/cmp; FOLFOX;possible   d-3 pump off;  Dr.B   Above plan of care was discussed with patient/family in detail.  My contact information was given to the patient/family.     Earna Coder, MD 03/04/2024  9:32 AM

## 2024-03-05 ENCOUNTER — Ambulatory Visit: Admitting: Family Medicine

## 2024-03-06 ENCOUNTER — Inpatient Hospital Stay

## 2024-03-06 DIAGNOSIS — Z5111 Encounter for antineoplastic chemotherapy: Secondary | ICD-10-CM | POA: Diagnosis not present

## 2024-03-06 DIAGNOSIS — C186 Malignant neoplasm of descending colon: Secondary | ICD-10-CM

## 2024-03-06 MED ORDER — HEPARIN SOD (PORK) LOCK FLUSH 100 UNIT/ML IV SOLN
500.0000 [IU] | Freq: Once | INTRAVENOUS | Status: AC | PRN
  Administered 2024-03-06: 500 [IU]
  Filled 2024-03-06: qty 5

## 2024-03-18 ENCOUNTER — Inpatient Hospital Stay

## 2024-03-18 ENCOUNTER — Inpatient Hospital Stay: Admitting: Internal Medicine

## 2024-03-18 ENCOUNTER — Encounter: Payer: Self-pay | Admitting: Internal Medicine

## 2024-03-18 ENCOUNTER — Other Ambulatory Visit: Payer: Self-pay | Admitting: *Deleted

## 2024-03-18 ENCOUNTER — Other Ambulatory Visit: Payer: Self-pay | Admitting: Internal Medicine

## 2024-03-18 DIAGNOSIS — C186 Malignant neoplasm of descending colon: Secondary | ICD-10-CM

## 2024-03-18 DIAGNOSIS — Z5111 Encounter for antineoplastic chemotherapy: Secondary | ICD-10-CM | POA: Diagnosis not present

## 2024-03-18 LAB — CMP (CANCER CENTER ONLY)
ALT: 13 U/L (ref 0–44)
AST: 17 U/L (ref 15–41)
Albumin: 3.3 g/dL — ABNORMAL LOW (ref 3.5–5.0)
Alkaline Phosphatase: 90 U/L (ref 38–126)
Anion gap: 8 (ref 5–15)
BUN: 18 mg/dL (ref 8–23)
CO2: 21 mmol/L — ABNORMAL LOW (ref 22–32)
Calcium: 8.9 mg/dL (ref 8.9–10.3)
Chloride: 109 mmol/L (ref 98–111)
Creatinine: 0.88 mg/dL (ref 0.61–1.24)
GFR, Estimated: >60 mL/min (ref >60)
Glucose, Bld: 203 mg/dL — ABNORMAL HIGH (ref 70–99)
Potassium: 3.6 mmol/L (ref 3.5–5.1)
Sodium: 138 mmol/L (ref 135–145)
Total Bilirubin: 0.2 mg/dL (ref 0.0–1.2)
Total Protein: 6.6 g/dL (ref 6.5–8.1)

## 2024-03-18 LAB — CBC WITH DIFFERENTIAL (CANCER CENTER ONLY)
Abs Immature Granulocytes: 0.03 10*3/uL (ref 0.00–0.07)
Basophils Absolute: 0 10*3/uL (ref 0.0–0.1)
Basophils Relative: 1 %
Eosinophils Absolute: 0.2 10*3/uL (ref 0.0–0.5)
Eosinophils Relative: 4 %
HCT: 31.9 % — ABNORMAL LOW (ref 39.0–52.0)
Hemoglobin: 10.8 g/dL — ABNORMAL LOW (ref 13.0–17.0)
Immature Granulocytes: 1 %
Lymphocytes Relative: 14 %
Lymphs Abs: 0.7 10*3/uL (ref 0.7–4.0)
MCH: 33.6 pg (ref 26.0–34.0)
MCHC: 33.9 g/dL (ref 30.0–36.0)
MCV: 99.4 fL (ref 80.0–100.0)
Monocytes Absolute: 1 10*3/uL (ref 0.1–1.0)
Monocytes Relative: 19 %
Neutro Abs: 3.2 10*3/uL (ref 1.7–7.7)
Neutrophils Relative %: 61 %
Platelet Count: 137 10*3/uL — ABNORMAL LOW (ref 150–400)
RBC: 3.21 MIL/uL — ABNORMAL LOW (ref 4.22–5.81)
RDW: 12.4 % (ref 11.5–15.5)
WBC Count: 5.2 10*3/uL (ref 4.0–10.5)
nRBC: 0 % (ref 0.0–0.2)

## 2024-03-18 MED ORDER — LEUCOVORIN CALCIUM INJECTION 350 MG
400.0000 mg/m2 | Freq: Once | INTRAMUSCULAR | Status: AC
  Administered 2024-03-18: 796 mg via INTRAVENOUS
  Filled 2024-03-18: qty 39.8

## 2024-03-18 MED ORDER — SODIUM CHLORIDE 0.9 % IV SOLN
2400.0000 mg/m2 | INTRAVENOUS | Status: DC
  Administered 2024-03-18: 5000 mg via INTRAVENOUS
  Filled 2024-03-18: qty 100

## 2024-03-18 MED ORDER — FLUOROURACIL CHEMO INJECTION 2.5 GM/50ML
400.0000 mg/m2 | Freq: Once | INTRAVENOUS | Status: AC
  Administered 2024-03-18: 800 mg via INTRAVENOUS
  Filled 2024-03-18: qty 16

## 2024-03-18 MED ORDER — DEXTROSE 5 % IV SOLN
INTRAVENOUS | Status: DC
  Filled 2024-03-18: qty 250

## 2024-03-18 MED ORDER — PALONOSETRON HCL INJECTION 0.25 MG/5ML
0.2500 mg | Freq: Once | INTRAVENOUS | Status: AC
  Administered 2024-03-18: 0.25 mg via INTRAVENOUS
  Filled 2024-03-18: qty 5

## 2024-03-18 MED ORDER — OXALIPLATIN CHEMO INJECTION 100 MG/20ML
85.0000 mg/m2 | Freq: Once | INTRAVENOUS | Status: AC
  Administered 2024-03-18: 170 mg via INTRAVENOUS
  Filled 2024-03-18: qty 34

## 2024-03-18 MED ORDER — DEXAMETHASONE SODIUM PHOSPHATE 10 MG/ML IJ SOLN
10.0000 mg | Freq: Once | INTRAMUSCULAR | Status: AC
  Administered 2024-03-18: 10 mg via INTRAVENOUS
  Filled 2024-03-18: qty 1

## 2024-03-18 NOTE — Telephone Encounter (Signed)
 Dr. Auston Left has received the ONO results and he wants to start 02 at night 1 liter

## 2024-03-18 NOTE — Patient Instructions (Signed)
 [facility] - A DEPT OF [facility]  Discharge Instructions: Thank you for choosing [facility] to provide your oncology and hematology care.  If you have a lab appointment with the Cancer Center, please go directly to the Cancer Center and check in at the registration area.  Wear comfortable clothing and clothing appropriate for easy access to any Portacath or PICC line.   We strive to give you quality time with your provider. You may need to reschedule your appointment if you arrive late (15 or more minutes).  Arriving late affects you and other patients whose appointments are after yours.  Also, if you miss three or more appointments without notifying the office, you may be dismissed from the clinic at the provider's discretion.      For prescription refill requests, have your pharmacy contact our office and allow 72 hours for refills to be completed.    Today you received the following chemotherapy and/or immunotherapy agents- oxaliplatin , leucovorin , 84fu      To help prevent nausea and vomiting after your treatment, we encourage you to take your nausea medication as directed.  BELOW ARE SYMPTOMS THAT SHOULD BE REPORTED IMMEDIATELY: *FEVER GREATER THAN 100.4 F (38 C) OR HIGHER *CHILLS OR SWEATING *NAUSEA AND VOMITING THAT IS NOT CONTROLLED WITH YOUR NAUSEA MEDICATION *UNUSUAL SHORTNESS OF BREATH *UNUSUAL BRUISING OR BLEEDING *URINARY PROBLEMS (pain or burning when urinating, or frequent urination) *BOWEL PROBLEMS (unusual diarrhea, constipation, pain near the anus) TENDERNESS IN MOUTH AND THROAT WITH OR WITHOUT PRESENCE OF ULCERS (sore throat, sores in mouth, or a toothache) UNUSUAL RASH, SWELLING OR PAIN  UNUSUAL VAGINAL DISCHARGE OR ITCHING   Items with * indicate a potential emergency and should be followed up as soon as possible or go to the Emergency Department if any problems should occur.  Please show the CHEMOTHERAPY ALERT CARD  or IMMUNOTHERAPY ALERT CARD at check-in to the Emergency Department and triage nurse.  Should you have questions after your visit or need to cancel or reschedule your appointment, please contact [facility] - A DEPT OF [facility]  [phone] and follow the prompts.  Office hours are 8:00 a.m. to 4:30 p.m. Monday - Friday. Please note that voicemails left after 4:00 p.m. may not be returned until the following business day.  We are closed weekends and major holidays. You have access to a nurse at all times for urgent questions. Please call the main number to the clinic [phone] and follow the prompts.  For any non-urgent questions, you may also contact your provider using MyChart. We now offer e-Visits for anyone 66 and older to request care online for non-urgent symptoms. For details visit [email or web address].   Also download the MyChart app! Go to the app store, search "MyChart", open the app, select [facility], and log in with your MyChart username and password.

## 2024-03-18 NOTE — Progress Notes (Signed)
 [facility] CONSULT NOTE  Patient Care Team: Mimi Alt, MD as PCP - General (Family Medicine) Rochell Chroman, RN as Oncology Nurse Navigator Gwyn Leos, MD as Consulting Physician (Oncology)  CHIEF COMPLAINTS/PURPOSE OF CONSULTATION: Colon cancer  Oncology History Overview Note     Colon cancer, status post a partial left colectomy 09/04/2023, stage IIIa (pT2 pN1b) CT abdomen/pelvis 03/01/2023-prostate/pelvic abscess, large amount of colonic stool CT chest 03/30/2023-bilateral solid pulmonary nodules-likely benign, 11-month follow-up CT recommended Colonoscopy 04/12/2023-multiple polyps removed, greater than 50 mm polyp in the distal ascending colon and sigmoid colon not removed Colonoscopy 06/29/2023 greater than 50 mm polyps removed from the ascending, transverse, and descending colon-tubular adenomas, sigmoid lesion at 35 cm incompletely resected with biopsy revealing an ulcerated tubular adenoma with at least high-grade dysplasia and suspicious for invasive or [place] Descending colon resection 09/04/2023, pT2, PN 1B moderately differentiated adenocarcinoma, negative resection margins, lymphovascular invasion present, 3/11 lymph nodes, 2 tumor deposits, mismatch repair protein expression intact, MSS  Multiple colon polyps on colonoscopy 04/12/2023 and 06/29/2023-tubular adenomas Prostate cancer-Gleason 7 acinar adenocarcinoma involving 5-10% of submitted tissue from a TUR specimen 03/03/2023 Prostate 03/20/2023-mild prostamegaly- PI-RADS category 5 lesion in the left peripheral zone and category 4 lesion in the left anterior peripheral zone and left anterior fibromuscular stroma, reduced size of prostate abscess with continued surrounding prostatitis and periprostatic stranding IMRT 20 6/24 - 07/20/2023  4.  Diabetes 5.  History of colon cancer-negative CancerNext expanded panel 08/29/2023 6.  History of lung nodules, ongoing tobacco use, followed in the lung  cancer screening clinic# prostate/pelvic abscess.  He was admitted for antibiotics and drainage of the pelvic abscess 03/01/2023. A TUR and was found to have a Gleason 7 prostate cancer involving 5-10% of the submitted tissue.  He was referred to Dr. Jacalyn Martin and completed external beam radiation to the prostate.  # MAY 2024 [screening colo]- A greater than 50 mm polyp was removed from the proximal ascending colon.  Additional greater than 50 m polyps were not removed from the distal ascending and sigmoid colon.  Additional smaller polyps were removed from the colon. The pathology revealed multiple fragments of tubular adenomas. Dr. Ignacia Maki than 50 mm polyps were removed from the ascending, transverse, and descending colon.  A polypoid lesion at 35 cm was incompletely resected and tattooed.  The pathology from multiple polyps returned as tubular adenomas without high-grade dysplasia or carcinoma.  # OCTOBER 2024- STAGE III SIGMOID COLON CANCER [Dr.Sherril/Dr.White- [place]] pathology from the sigmoid resection returned as invasive moderately differentiated adenocarcinoma involving the descending colon.  Carcinoma invades into but not through the muscularis propria.  The resection margins are negative.  Lymphovascular invasion is present.  Metastatic carcinoma was identified in 3 of 11 lymph nodes and there were 2 tumor deposits.  Mismatch repair protein expression is intact.   # Multiple colon polyps on colonoscopy 04/12/2023 and 06/29/2023-tubular adenomas- s/p  Genetics counseling: negative CancerNext expanded panel 08/29/2023  # NOV 4th, 2024- FOLFOX q 2 w x 6 months.   # APRIL 2024-  Prostate cancer [s/p EBRT- Dr.Chrystal]-    Cancer of descending colon (HCC)  09/19/2023 Initial Diagnosis   Cancer of descending colon (HCC)   09/19/2023 Cancer Staging   Staging form: Colon and Rectum, AJCC 8th Edition - Pathologic: pT2, cM0 - Signed by Sumner Ends, MD on 09/19/2023 Histologic grading  system: 4 grade system Histologic grade (G): G2 Residual tumor (R): R0 - None Laterality: Left Lymph-vascular invasion (LVI): LVI  present/identified, NOS Tumor deposits (TD): Present Perineural invasion (PNI): Absent Microsatellite instability (MSI): Stable   09/19/2023 Cancer Staging   Staging form: Colon and Rectum, AJCC 8th Edition - Pathologic: Stage IIIA (pT2, pN1b, cM0) - Signed by Sumner Ends, MD on 09/19/2023   10/11/2023 -  Chemotherapy   Patient is on Treatment Plan : COLORECTAL FOLFOX q14d x 6 months      HISTORY OF PRESENTING ILLNESS: Patient ambulating-independently.  Alone.  Colin Griffin 73 y.o.  male pleasant patient with  stage III colon cancer s/p left hemicolectomy currently on adjuvant chemotherapy with FOLFOX is here for a follow up.  Mild tingling and numbness of the chemotherapy in his hands- slightly worse. But no issues with ADLs.   Cough improved after the inhalers.Denies any blood in stools or black-colored stools.  No nausea no vomiting.   Review of Systems  Constitutional:  Negative for chills, diaphoresis, fever, malaise/fatigue and weight loss.  HENT:  Negative for nosebleeds and sore throat.   Eyes:  Negative for double vision.  Respiratory:  Positive for cough. Negative for hemoptysis, sputum production, shortness of breath and wheezing.   Cardiovascular:  Negative for chest pain, palpitations, orthopnea and leg swelling.  Gastrointestinal:  Positive for nausea. Negative for abdominal pain, blood in stool, constipation, diarrhea, heartburn, melena and vomiting.  Genitourinary:  Negative for dysuria, frequency and urgency.  Musculoskeletal:  Negative for back pain and joint pain.  Skin: Negative.  Negative for itching and rash.  Neurological:  Negative for dizziness, tingling, focal weakness, weakness and headaches.  Endo/Heme/Allergies:  Does not bruise/bleed easily.  Psychiatric/Behavioral:  Negative for depression. The patient is not  nervous/anxious and does not have insomnia.     MEDICAL HISTORY:  Past Medical History:  Diagnosis Date   Cancer [facility]) April 2024   Constipation 03/04/2023   Diabetes mellitus without complication [facility]) March 2024    SURGICAL HISTORY: Past Surgical History:  Procedure Laterality Date   APPENDECTOMY     BIOPSY  06/29/2023   Procedure: BIOPSY;  Surgeon: Normie Becton., MD;  Location: [facility];  Service: Gastroenterology;;   COLONOSCOPY WITH PROPOFOL  N/A 04/12/2023   Procedure: COLONOSCOPY WITH PROPOFOL ;  Surgeon: Selena Daily, MD;  Location: [facility];  Service: Gastroenterology;  Laterality: N/A;   COLONOSCOPY WITH PROPOFOL  N/A 06/29/2023   Procedure: COLONOSCOPY WITH PROPOFOL ;  Surgeon: Mansouraty, Albino Alu., MD;  Location: [facility];  Service: Gastroenterology;  Laterality: N/A;   ENDOSCOPIC MUCOSAL RESECTION N/A 06/29/2023   Procedure: ENDOSCOPIC MUCOSAL RESECTION;  Surgeon: Brice Campi Albino Alu., MD;  Location: [facility];  Service: Gastroenterology;  Laterality: N/A;   FLEXIBLE SIGMOIDOSCOPY N/A 09/04/2023   Procedure: FLEXIBLE SIGMOIDOSCOPY;  Surgeon: Melvenia Stabs, MD;  Location: [facility];  Service: General;  Laterality: N/A;   HEMOSTASIS CONTROL  06/29/2023   Procedure: HEMOSTASIS CONTROL;  Surgeon: Normie Becton., MD;  Location: [facility];  Service: Gastroenterology;;   IR IMAGING GUIDED PORT INSERTION  09/26/2023   IR RADIOLOGIST EVAL & MGMT  03/16/2023   PELVIC ABCESS DRAINAGE     March 2024   POLYPECTOMY  06/29/2023   Procedure: POLYPECTOMY;  Surgeon: Brice Campi Albino Alu., MD;  Location: [facility];  Service: Gastroenterology;;   [place] LIFTING INJECTION  06/29/2023   Procedure: SUBMUCOSAL LIFTING INJECTION;  Surgeon: Normie Becton., MD;  Location: [facility];  Service: Gastroenterology;;   SUBMUCOSAL TATTOO INJECTION  06/29/2023   Procedure: SUBMUCOSAL TATTOO INJECTION;  Surgeon: Normie Becton., MD;   Location:  [facility];  Service: Gastroenterology;;   TRANSURETHRAL RESECTION OF PROSTATE N/A 03/03/2023   Procedure: TRANSURETHRAL RESECTION OF THE PROSTATE (TURP)/ UNROOFING OF PROSTATE ABSCESS;  Surgeon: Geraline Knapp, MD;  Location: [facility];  Service: Urology;  Laterality: N/A;    SOCIAL HISTORY: Social History   Socioeconomic History   Marital status: Married    Spouse name: Editor, commissioning   Number of children: 1   Years of education: Not on file   Highest education level: Master's degree (e.g., MA, MS, MEng, MEd, MSW, MBA)  Occupational History   Not on file  Tobacco Use   Smoking status: Every Day    Current packs/day: 1.50    Average packs/day: 1.5 packs/day for 54.3 years (81.5 ttl pk-yrs)    Types: Cigarettes    Start date: 70   Smokeless tobacco: Never   Tobacco comments:    Reports that at one time he smoked 3 packs per day while in the [employer]  Vaping Use   Vaping status: Every Day   Start date: 08/08/2022   Substances: Flavoring  Substance and Sexual Activity   Alcohol use: Not Currently   Drug use: Never   Sexual activity: Not Currently    Birth control/protection: None  Other Topics Concern   Not on file  Social History Narrative   ** Merged History Encounter **       Retired from Holiday representative for 30 years    Social Drivers of Corporate investment banker Strain: Low Risk  (05/01/2023)   Overall Financial Resource Strain (CARDIA)    Difficulty of Paying Living Expenses: Not hard at all  Food Insecurity: No Food Insecurity (09/19/2023)   Hunger Vital Sign    Worried About Running Out of Food in the Last Year: Never true    Ran Out of Food in the Last Year: Never true  Transportation Needs: No Transportation Needs (09/19/2023)   PRAPARE - Administrator, Civil Service (Medical): No    Lack of Transportation (Non-Medical): No  Physical Activity: Sufficiently Active (05/01/2023)   Exercise Vital Sign    Days of Exercise per  Week: 6 days    Minutes of Exercise per Session: 150+ min  Stress: No Stress Concern Present (05/01/2023)   [employer] of Occupational Health - Occupational Stress Questionnaire    Feeling of Stress : Not at all  Social Connections: Unknown (05/01/2023)   Social Connection and Isolation Panel [NHANES]    Frequency of Communication with Friends and Family: Three times a week    Frequency of Social Gatherings with Friends and Family: Twice a week    Attends Religious Services: Not on Marketing executive or Organizations: No    Attends Banker Meetings: Patient declined    Marital Status: Married  Catering manager Violence: Not At Risk (09/19/2023)   Humiliation, Afraid, Rape, and Kick questionnaire    Fear of Current or Ex-Partner: No    Emotionally Abused: No    Physically Abused: No    Sexually Abused: No    FAMILY HISTORY: Family History  Problem Relation Age of Onset   Atrial fibrillation Mother    Skin cancer Father        non-melanoma   Colon cancer Sister 38       Lynch Syndrome   Breast cancer Sister 11   Diabetes Brother    Throat cancer Maternal Uncle    Breast cancer Paternal Aunt 58 -  36   Colon cancer Maternal Grandmother 70   Alcohol abuse Maternal Grandfather    Breast cancer Paternal Grandmother 102   Prostate cancer Paternal Grandfather 49 - 68   Other Niece        2 nieces have Lynch Syndrome (brother's daughters)   Diabetes Other    Ovarian cancer Other 55       paternal first cousin once removed   Colon polyps Neg Hx    Stomach cancer Neg Hx    Esophageal cancer Neg Hx    Inflammatory bowel disease Neg Hx    Liver disease Neg Hx    Pancreatic cancer Neg Hx     ALLERGIES:  has no known allergies.  MEDICATIONS:  Current Outpatient Medications  Medication Sig Dispense Refill   albuterol  (VENTOLIN  HFA) 108 (90 Base) MCG/ACT inhaler Inhale 2 puffs into the lungs every 6 (six) hours as needed for wheezing or shortness of  breath. 8 g 2   Continuous Glucose Sensor (FREESTYLE LIBRE 3 SENSOR) MISC PLACE 1 SENSOR ON THE SKIN EVERY 14 DAYS. USE TO CHECK GLUCOSE CONTINUOUSLY 2 each 6   Dulaglutide  (TRULICITY ) 0.75 MG/0.5ML SOAJ INJECT 0.75 MG SUBCUTANEOUSLY ONE TIME PER WEEK 6 mL 3   Fluticasone-Umeclidin-Vilant (TRELEGY ELLIPTA ) 200-62.5-25 MCG/ACT AEPB Inhale 1 Act into the lungs daily. 1 each 6   lidocaine -prilocaine  (EMLA ) cream Apply 1 Application topically as needed. 30 g 1   metFORMIN  (GLUCOPHAGE -XR) 500 MG 24 hr tablet TAKE 1 TABLET BY MOUTH EVERY DAY WITH BREAKFAST 90 tablet 1   ondansetron  (ZOFRAN ) 8 MG tablet One pill every 8 hours as needed for nausea/vomitting. 40 tablet 1   Vibegron  (GEMTESA ) 75 MG TABS Take 1 tablet (75 mg total) by mouth daily.     ferrous sulfate  325 (65 FE) MG tablet Take 1 tablet (325 mg total) by mouth 2 (two) times daily with a meal. 60 tablet 0   No current facility-administered medications for this visit.   Facility-Administered Medications Ordered in Other Visits  Medication Dose Route Frequency Provider Last Rate Last Admin   dextrose  5 % solution   Intravenous Continuous Clarke Amburn R, MD 10 mL/hr at 03/04/24 0923 New Bag at 03/04/24 0923    PHYSICAL EXAMINATION:   Vitals:   03/18/24 0832  BP: 120/74  Pulse: 87  Resp: 12  Temp: 98 F (36.7 C)  SpO2: 96%   Filed Weights   03/18/24 0832  Weight: 177 lb 9.6 oz (80.6 kg)    Physical Exam Vitals and nursing note reviewed.  HENT:     Head: Normocephalic and atraumatic.     Mouth/Throat:     Pharynx: Oropharynx is clear.  Eyes:     Extraocular Movements: Extraocular movements intact.     Pupils: Pupils are equal, round, and reactive to light.  Cardiovascular:     Rate and Rhythm: Normal rate and regular rhythm.  Pulmonary:     Comments: Decreased breath sounds bilaterally.  Abdominal:     Palpations: Abdomen is soft.  Musculoskeletal:        General: Normal range of motion.     Cervical back:  Normal range of motion.  Skin:    General: Skin is warm.  Neurological:     General: No focal deficit present.     Mental Status: He is alert and oriented to person, place, and time.  Psychiatric:        Behavior: Behavior normal.        Judgment: Judgment normal.  LABORATORY DATA:  I have reviewed the data as listed Lab Results  Component Value Date   WBC 5.2 03/18/2024   HGB 10.8 (L) 03/18/2024   HCT 31.9 (L) 03/18/2024   MCV 99.4 03/18/2024   PLT 137 (L) 03/18/2024   Recent Labs    02/19/24 0808 03/04/24 0823 03/18/24 0812  NA 137 133* 138  K 3.6 3.5 3.6  CL 107 109 109  CO2 21* 20* 21*  GLUCOSE 208* 179* 203*  BUN 15 19 18   CREATININE 1.13 1.04 0.88  CALCIUM  8.8* 8.8* 8.9  GFRNONAA >60 >60 >60  PROT 6.5 6.7 6.6  ALBUMIN 3.4* 3.6 3.3*  AST 19 17 17   ALT 15 12 13   ALKPHOS 55 80 90  BILITOT 0.5 0.5 0.2    RADIOGRAPHIC STUDIES: I have personally reviewed the radiological images as listed and agreed with the findings in the report. No results found.   Cancer of descending colon (HCC) # OCT 2024-Colon cancer, status post a partial left colectomy 09/04/2023, stage IIIa (pT2 pN1b)-negative resection margins, lymphovascular invasion present, 3/11 lymph nodes, 2 tumor deposits. [Drs.Sherrill; White;Masarouty]. mismatch repair protein expression intact. FOLFOX chemotherapy is given every 2 weeks. Awaiting colonoscopy < 1 year.    #  Proceed with chemo FOLFOX # 11 of planned 12.   Labs-CBC/chemistries were reviewed with the patient-Stable.  Will order Signetera at next visit- also order baseline CT scan at next visit.   # Thrombocytopenia- > 100- sec to chemo- monitor  for now. Stable.   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  Stable.  # COPD/ albuterol  prn; s/p steroids [Dr.Purlmonary] on inhalers- Stable.   # 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 involving 5-10% of submitted tissue from a TUR specimen 03/03/2023; s/p EBRT- IMRT 20  6/24 - UTI- /improved. Stable.   # Diabetes-FBG 203- Trulicity /metformin - CGM-monitor closely on chemotherapy/steroids- stable  # IDA- Hb 9-10- ferritin- on PO iron; hold off venofer for now. NOv 2024- ferritni-98; sat- 30%-   stable  # Acitive smoker: Recommend quitting smoking.  Continue lung cancer screening-   # IV access port  Last cycle -moved out [place] 1 week- [place]  PS # DISPOSITION:  # Chemo today- FOLFOX-chemo today; cancel- d-3 pump off;   # follow up in 3  weeks- MD; port/labs-- cbc/cmp; FOLFOX;possible   d-3 pump off;  Dr.B   Above plan of care was discussed with patient/family in detail.  My contact information was given to the patient/family.     Gwyn Leos, MD 03/18/2024 9:18 AM

## 2024-03-18 NOTE — Progress Notes (Signed)
 C/o finger tips getting worse with the numbness and tingling.

## 2024-03-18 NOTE — Assessment & Plan Note (Addendum)
#   OCT 2024-Colon cancer, status post a partial left colectomy 09/04/2023, stage IIIa (pT2 pN1b)-negative resection margins, lymphovascular invasion present, 3/11 lymph nodes, 2 tumor deposits. [Drs.Sherrill; White;Masarouty]. mismatch repair protein expression intact. FOLFOX chemotherapy is given every 2 weeks. Awaiting colonoscopy < 1 year.    #  Proceed with chemo FOLFOX # 11 of planned 12.   Labs-CBC/chemistries were reviewed with the patient-Stable.  Will order Signetera at next visit- also order baseline CT scan at next visit.   # Thrombocytopenia- > 100- sec to chemo- monitor  for now. Stable.   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  Stable.  # COPD/ albuterol  prn; s/p steroids [Dr.Purlmonary] on inhalers- Stable.   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 Stable.   # Diabetes-FBG 203- Trulicity /metformin - CGM-monitor closely on chemotherapy/steroids- stable  # IDA- Hb 9-10- ferritin- on PO iron; hold off venofer for now. NOv 2024- ferritni-98; sat- 30%-   stable  # Acitive smoker: Recommend quitting smoking.  Continue lung cancer screening-   # IV access port  Last cycle -moved out by 1 week- beach  PS # DISPOSITION:  # Chemo today- FOLFOX-chemo today; cancel- d-3 pump off;   # follow up in 3  weeks- MD; port/labs-- cbc/cmp; FOLFOX;possible   d-3 pump off;  Dr.B

## 2024-03-19 ENCOUNTER — Telehealth: Payer: Self-pay

## 2024-03-19 DIAGNOSIS — J449 Chronic obstructive pulmonary disease, unspecified: Secondary | ICD-10-CM

## 2024-03-19 DIAGNOSIS — J849 Interstitial pulmonary disease, unspecified: Secondary | ICD-10-CM

## 2024-03-19 NOTE — Telephone Encounter (Signed)
 ONO reviewed by Dr. Auston Left- Low SpO2 81%. Start Oxygen 1L [place] at night.

## 2024-03-19 NOTE — Telephone Encounter (Signed)
 Lm x1 for the patient.

## 2024-03-20 ENCOUNTER — Encounter

## 2024-03-20 ENCOUNTER — Inpatient Hospital Stay

## 2024-03-20 VITALS — BP 114/80 | HR 102 | Temp 99.0°F | Resp 15

## 2024-03-20 DIAGNOSIS — Z5111 Encounter for antineoplastic chemotherapy: Secondary | ICD-10-CM | POA: Diagnosis not present

## 2024-03-20 DIAGNOSIS — C186 Malignant neoplasm of descending colon: Secondary | ICD-10-CM

## 2024-03-20 MED ORDER — SODIUM CHLORIDE 0.9% FLUSH
10.0000 mL | INTRAVENOUS | Status: DC | PRN
  Administered 2024-03-20: 10 mL
  Filled 2024-03-20: qty 10

## 2024-03-20 MED ORDER — HEPARIN SOD (PORK) LOCK FLUSH 100 UNIT/ML IV SOLN
500.0000 [IU] | Freq: Once | INTRAVENOUS | Status: AC | PRN
  Administered 2024-03-20: 500 [IU]
  Filled 2024-03-20: qty 5

## 2024-03-20 NOTE — Telephone Encounter (Signed)
 I spoke with the patient. He said he thinks the test was faulty because he is doing Chemotherapy and his fingers go numb. He said he had problems getting the device to work when he put it on.  Please advise.

## 2024-03-20 NOTE — Telephone Encounter (Signed)
 Lm x1 for the patient.

## 2024-03-20 NOTE — Telephone Encounter (Signed)
 Lm x2 for the patient.

## 2024-03-21 NOTE — Telephone Encounter (Signed)
 I notified the patinet. He feels like he should be able to do an accurate test in 2 weeks. He said after his Chemo they tried getting his O2 on his finger and it was low. When they put it on his ear it was 100%.   Dr. Auston Left, ok to place order for ONO in 2 weeks?

## 2024-03-21 NOTE — Telephone Encounter (Signed)
 Order has been placed and the patient is aware.  .Nothing further needed.

## 2024-04-01 ENCOUNTER — Encounter: Payer: Self-pay | Admitting: Internal Medicine

## 2024-04-03 ENCOUNTER — Other Ambulatory Visit: Payer: Self-pay | Admitting: *Deleted

## 2024-04-03 ENCOUNTER — Ambulatory Visit
Admission: RE | Admit: 2024-04-03 | Discharge: 2024-04-03 | Disposition: A | Discharge status: Home / Self Care | Source: Ambulatory Visit | Attending: Hospice and Palliative Medicine | Admitting: Hospice and Palliative Medicine

## 2024-04-03 ENCOUNTER — Other Ambulatory Visit: Payer: Self-pay

## 2024-04-03 ENCOUNTER — Other Ambulatory Visit: Payer: Self-pay | Admitting: Internal Medicine

## 2024-04-03 ENCOUNTER — Encounter: Payer: Self-pay | Admitting: Internal Medicine

## 2024-04-03 ENCOUNTER — Encounter: Payer: Self-pay | Admitting: Hospice and Palliative Medicine

## 2024-04-03 ENCOUNTER — Inpatient Hospital Stay (HOSPITAL_BASED_OUTPATIENT_CLINIC_OR_DEPARTMENT_OTHER): Admitting: Hospice and Palliative Medicine

## 2024-04-03 ENCOUNTER — Inpatient Hospital Stay

## 2024-04-03 ENCOUNTER — Inpatient Hospital Stay: Attending: Oncology

## 2024-04-03 ENCOUNTER — Ambulatory Visit
Admission: RE | Admit: 2024-04-03 | Discharge: 2024-04-03 | Disposition: A | Discharge status: Home / Self Care | Attending: Hospice and Palliative Medicine | Admitting: Hospice and Palliative Medicine

## 2024-04-03 VITALS — BP 122/72

## 2024-04-03 VITALS — BP 107/65 | HR 105 | Temp 98.4°F | Resp 22 | Ht 73.0 in | Wt 174.0 lb

## 2024-04-03 DIAGNOSIS — E860 Dehydration: Secondary | ICD-10-CM | POA: Diagnosis present

## 2024-04-03 DIAGNOSIS — B370 Candidal stomatitis: Secondary | ICD-10-CM | POA: Diagnosis not present

## 2024-04-03 DIAGNOSIS — R0602 Shortness of breath: Secondary | ICD-10-CM

## 2024-04-03 DIAGNOSIS — C186 Malignant neoplasm of descending colon: Secondary | ICD-10-CM

## 2024-04-03 DIAGNOSIS — J069 Acute upper respiratory infection, unspecified: Secondary | ICD-10-CM | POA: Insufficient documentation

## 2024-04-03 DIAGNOSIS — R051 Acute cough: Secondary | ICD-10-CM | POA: Diagnosis not present

## 2024-04-03 DIAGNOSIS — R21 Rash and other nonspecific skin eruption: Secondary | ICD-10-CM | POA: Insufficient documentation

## 2024-04-03 DIAGNOSIS — Z95828 Presence of other vascular implants and grafts: Secondary | ICD-10-CM

## 2024-04-03 LAB — CBC WITH DIFFERENTIAL (CANCER CENTER ONLY)
Abs Immature Granulocytes: 0.25 10*3/uL — ABNORMAL HIGH (ref 0.00–0.07)
Basophils Absolute: 0.1 10*3/uL (ref 0.0–0.1)
Basophils Relative: 1 %
Eosinophils Absolute: 0.3 10*3/uL (ref 0.0–0.5)
Eosinophils Relative: 6 %
HCT: 32.4 % — ABNORMAL LOW (ref 39.0–52.0)
Hemoglobin: 11.2 g/dL — ABNORMAL LOW (ref 13.0–17.0)
Immature Granulocytes: 4 %
Lymphocytes Relative: 20 %
Lymphs Abs: 1.2 10*3/uL (ref 0.7–4.0)
MCH: 32.7 pg (ref 26.0–34.0)
MCHC: 34.6 g/dL (ref 30.0–36.0)
MCV: 94.5 fL (ref 80.0–100.0)
Monocytes Absolute: 1.2 10*3/uL — ABNORMAL HIGH (ref 0.1–1.0)
Monocytes Relative: 21 %
Neutro Abs: 2.8 10*3/uL (ref 1.7–7.7)
Neutrophils Relative %: 48 %
Platelet Count: 192 10*3/uL (ref 150–400)
RBC: 3.43 MIL/uL — ABNORMAL LOW (ref 4.22–5.81)
RDW: 12 % (ref 11.5–15.5)
WBC Count: 5.8 10*3/uL (ref 4.0–10.5)
nRBC: 0 % (ref 0.0–0.2)

## 2024-04-03 LAB — CMP (CANCER CENTER ONLY)
ALT: 17 U/L (ref 0–44)
AST: 13 U/L — ABNORMAL LOW (ref 15–41)
Albumin: 3.1 g/dL — ABNORMAL LOW (ref 3.5–5.0)
Alkaline Phosphatase: 100 U/L (ref 38–126)
Anion gap: 10 (ref 5–15)
BUN: 22 mg/dL (ref 8–23)
CO2: 21 mmol/L — ABNORMAL LOW (ref 22–32)
Calcium: 8.5 mg/dL — ABNORMAL LOW (ref 8.9–10.3)
Chloride: 102 mmol/L (ref 98–111)
Creatinine: 1.07 mg/dL (ref 0.61–1.24)
GFR, Estimated: >60 mL/min (ref >60)
Glucose, Bld: 171 mg/dL — ABNORMAL HIGH (ref 70–99)
Potassium: 3.5 mmol/L (ref 3.5–5.1)
Sodium: 133 mmol/L — ABNORMAL LOW (ref 135–145)
Total Bilirubin: 0.2 mg/dL (ref 0.0–1.2)
Total Protein: 7.2 g/dL (ref 6.5–8.1)

## 2024-04-03 MED ORDER — HEPARIN SOD (PORK) LOCK FLUSH 100 UNIT/ML IV SOLN
500.0000 [IU] | Freq: Once | INTRAVENOUS | Status: AC
  Administered 2024-04-03: 500 [IU]
  Filled 2024-04-03: qty 5

## 2024-04-03 MED ORDER — SODIUM CHLORIDE 0.9 % IV SOLN
INTRAVENOUS | Status: DC
  Filled 2024-04-03 (×2): qty 250

## 2024-04-03 MED ORDER — AMOXICILLIN-POT CLAVULANATE 875-125 MG PO TABS: 1.0000 | ORAL_TABLET | Freq: Two times a day (BID) | ORAL | 0 refills | Status: DC

## 2024-04-03 MED ORDER — PREDNISONE 20 MG PO TABS: ORAL_TABLET | ORAL | 0 refills | Status: DC

## 2024-04-03 MED ORDER — NYSTATIN 100000 UNIT/ML MT SUSP: 5.0000 mL | Freq: Four times a day (QID) | OROMUCOSAL | 0 refills | Status: DC

## 2024-04-03 NOTE — Progress Notes (Signed)
 Symptom Management Clinic [facility] at [facility] [phone] [phone]  Patient Care Team: Mimi Alt, MD as PCP - General (Family Medicine) Rochell Chroman, RN as Oncology Nurse Navigator Gwyn Leos, MD as Consulting Physician (Oncology)   NAME OF PATIENT: Colin Griffin  [id number]  [birth year]   DATE OF VISIT: 04/03/24  REASON FOR CONSULT: Jamaari Crull is a 73 y.o. male with multiple medical problems including stage III colon cancer status post left hemicolectomy currently on adjuvant chemotherapy with FOLFOX.   INTERVAL HISTORY: Patient last saw Dr. Valentine Gasmen on 03/18/2024 for cycle 11 FOLFOX (of planned 12).  Patient presents [facility] for evaluation of weakness and fatigue.  Patient states that since he last received chemotherapy, he has had shortness of breath, fatigue, cough and respiratory congestion.  Denies fever or chills.  Denies chest pain.  States that he is occasionally had dizziness when he stands or ambulates.  He did have a recent fall.  He feels like his oral intake is poor.  He also endorses sore throat and a pruritic rash on his legs and torso.  Denies chest pain. Denies any nausea, vomiting, constipation, or diarrhea. Denies urinary complaints. Patient offers no further specific complaints today.   PAST MEDICAL HISTORY: Past Medical History:  Diagnosis Date   Cancer [facility]) April 2024   Constipation 03/04/2023   Diabetes mellitus without complication [facility]) March 2024    PAST SURGICAL HISTORY:  Past Surgical History:  Procedure Laterality Date   APPENDECTOMY     BIOPSY  06/29/2023   Procedure: BIOPSY;  Surgeon: Normie Becton., MD;  Location: [facility];  Service: Gastroenterology;;   COLONOSCOPY WITH PROPOFOL  N/A 04/12/2023   Procedure: COLONOSCOPY WITH PROPOFOL ;  Surgeon: Selena Daily, MD;  Location: [facility] ENDOSCOPY;  Service: Gastroenterology;  Laterality: N/A;    COLONOSCOPY WITH PROPOFOL  N/A 06/29/2023   Procedure: COLONOSCOPY WITH PROPOFOL ;  Surgeon: Brice Campi Albino Alu., MD;  Location: [facility] ENDOSCOPY;  Service: Gastroenterology;  Laterality: N/A;   ENDOSCOPIC MUCOSAL RESECTION N/A 06/29/2023   Procedure: ENDOSCOPIC MUCOSAL RESECTION;  Surgeon: Brice Campi Albino Alu., MD;  Location: [facility] ENDOSCOPY;  Service: Gastroenterology;  Laterality: N/A;   FLEXIBLE SIGMOIDOSCOPY N/A 09/04/2023   Procedure: FLEXIBLE SIGMOIDOSCOPY;  Surgeon: Melvenia Stabs, MD;  Location: [facility] ORS;  Service: General;  Laterality: N/A;   HEMOSTASIS CONTROL  06/29/2023   Procedure: HEMOSTASIS CONTROL;  Surgeon: Normie Becton., MD;  Location: [facility] ENDOSCOPY;  Service: Gastroenterology;;   IR IMAGING GUIDED PORT INSERTION  09/26/2023   IR RADIOLOGIST EVAL & MGMT  03/16/2023   PELVIC ABCESS DRAINAGE     March 2024   POLYPECTOMY  06/29/2023   Procedure: POLYPECTOMY;  Surgeon: Mansouraty, Albino Alu., MD;  Location: [facility];  Service: Gastroenterology;;   [place] LIFTING INJECTION  06/29/2023   Procedure: SUBMUCOSAL LIFTING INJECTION;  Surgeon: Normie Becton., MD;  Location: [facility];  Service: Gastroenterology;;   SUBMUCOSAL TATTOO INJECTION  06/29/2023   Procedure: SUBMUCOSAL TATTOO INJECTION;  Surgeon: Normie Becton., MD;  Location: [facility];  Service: Gastroenterology;;   TRANSURETHRAL RESECTION OF PROSTATE N/A 03/03/2023   Procedure: TRANSURETHRAL RESECTION OF THE PROSTATE (TURP)/ UNROOFING OF PROSTATE ABSCESS;  Surgeon: Geraline Knapp, MD;  Location: [facility] ORS;  Service: Urology;  Laterality: N/A;    HEMATOLOGY/ONCOLOGY HISTORY:  Oncology History Overview Note     Colon cancer, status post a partial left colectomy 09/04/2023, stage IIIa (pT2 pN1b) CT abdomen/pelvis 03/01/2023-prostate/pelvic abscess, large amount of colonic stool  CT chest 03/30/2023-bilateral solid pulmonary nodules-likely benign, 45-month follow-up CT recommended Colonoscopy  04/12/2023-multiple polyps removed, greater than 50 mm polyp in the distal ascending colon and sigmoid colon not removed Colonoscopy 06/29/2023 greater than 50 mm polyps removed from the ascending, transverse, and descending colon-tubular adenomas, sigmoid lesion at 35 cm incompletely resected with biopsy revealing an ulcerated tubular adenoma with at least high-grade dysplasia and suspicious for invasive or [place] Descending colon resection 09/04/2023, pT2, PN 1B moderately differentiated adenocarcinoma, negative resection margins, lymphovascular invasion present, 3/11 lymph nodes, 2 tumor deposits, mismatch repair protein expression intact, MSS  Multiple colon polyps on colonoscopy 04/12/2023 and 06/29/2023-tubular adenomas Prostate cancer-Gleason 7 acinar adenocarcinoma involving 5-10% of submitted tissue from a TUR specimen 03/03/2023 Prostate 03/20/2023-mild prostamegaly- PI-RADS category 5 lesion in the left peripheral zone and category 4 lesion in the left anterior peripheral zone and left anterior fibromuscular stroma, reduced size of prostate abscess with continued surrounding prostatitis and periprostatic stranding IMRT 20 6/24 - 07/20/2023  4.  Diabetes 5.  History of colon cancer-negative CancerNext expanded panel 08/29/2023 6.  History of lung nodules, ongoing tobacco use, followed in the lung cancer screening clinic# prostate/pelvic abscess.  He was admitted for antibiotics and drainage of the pelvic abscess 03/01/2023. A TUR and was found to have a Gleason 7 prostate cancer involving 5-10% of the submitted tissue.  He was referred to Dr. Jacalyn Martin and completed external beam radiation to the prostate.  # MAY 2024 [screening colo]- A greater than 50 mm polyp was removed from the proximal ascending colon.  Additional greater than 50 m polyps were not removed from the distal ascending and sigmoid colon.  Additional smaller polyps were removed from the colon. The pathology revealed multiple fragments of  tubular adenomas. Dr. Ignacia Maki than 50 mm polyps were removed from the ascending, transverse, and descending colon.  A polypoid lesion at 35 cm was incompletely resected and tattooed.  The pathology from multiple polyps returned as tubular adenomas without high-grade dysplasia or carcinoma.  # OCTOBER 2024- STAGE III SIGMOID COLON CANCER [Dr.Sherril/Dr.White- [place]] pathology from the sigmoid resection returned as invasive moderately differentiated adenocarcinoma involving the descending colon.  Carcinoma invades into but not through the muscularis propria.  The resection margins are negative.  Lymphovascular invasion is present.  Metastatic carcinoma was identified in 3 of 11 lymph nodes and there were 2 tumor deposits.  Mismatch repair protein expression is intact.   # Multiple colon polyps on colonoscopy 04/12/2023 and 06/29/2023-tubular adenomas- s/p  Genetics counseling: negative CancerNext expanded panel 08/29/2023  # NOV 4th, 2024- FOLFOX q 2 w x 6 months.   # APRIL 2024-  Prostate cancer [s/p EBRT- Dr.Chrystal]-    Cancer of descending colon (HCC)  09/19/2023 Initial Diagnosis   Cancer of descending colon (HCC)   09/19/2023 Cancer Staging   Staging form: Colon and Rectum, AJCC 8th Edition - Pathologic: pT2, cM0 - Signed by Sumner Ends, MD on 09/19/2023 Histologic grading system: 4 grade system Histologic grade (G): G2 Residual tumor (R): R0 - None Laterality: Left Lymph-vascular invasion (LVI): LVI present/identified, NOS Tumor deposits (TD): Present Perineural invasion (PNI): Absent Microsatellite instability (MSI): Stable   09/19/2023 Cancer Staging   Staging form: Colon and Rectum, AJCC 8th Edition - Pathologic: Stage IIIA (pT2, pN1b, cM0) - Signed by Sumner Ends, MD on 09/19/2023   10/11/2023 -  Chemotherapy   Patient is on Treatment Plan : COLORECTAL FOLFOX q14d x 6 months       ALLERGIES:  has no known allergies.  MEDICATIONS:  Current Outpatient  Medications  Medication Sig Dispense Refill   albuterol  (VENTOLIN  HFA) 108 (90 Base) MCG/ACT inhaler Inhale 2 puffs into the lungs every 6 (six) hours as needed for wheezing or shortness of breath. 8 g 2   Continuous Glucose Sensor (FREESTYLE LIBRE 3 SENSOR) MISC PLACE 1 SENSOR ON THE SKIN EVERY 14 DAYS. USE TO CHECK GLUCOSE CONTINUOUSLY 2 each 6   Dulaglutide  (TRULICITY ) 0.75 MG/0.5ML SOAJ INJECT 0.75 MG SUBCUTANEOUSLY ONE TIME PER WEEK 6 mL 3   ferrous sulfate  325 (65 FE) MG tablet Take 1 tablet (325 mg total) by mouth 2 (two) times daily with a meal. 60 tablet 0   Fluticasone-Umeclidin-Vilant (TRELEGY ELLIPTA ) 200-62.5-25 MCG/ACT AEPB Inhale 1 Act into the lungs daily. 1 each 6   lidocaine -prilocaine  (EMLA ) cream Apply 1 Application topically as needed. 30 g 1   metFORMIN  (GLUCOPHAGE -XR) 500 MG 24 hr tablet TAKE 1 TABLET BY MOUTH EVERY DAY WITH BREAKFAST 90 tablet 1   ondansetron  (ZOFRAN ) 8 MG tablet One pill every 8 hours as needed for nausea/vomitting. 40 tablet 1   Vibegron  (GEMTESA ) 75 MG TABS Take 1 tablet (75 mg total) by mouth daily.     No current facility-administered medications for this visit.   Facility-Administered Medications Ordered in Other Visits  Medication Dose Route Frequency Provider Last Rate Last Admin   dextrose  5 % solution   Intravenous Continuous Brahmanday, Govinda R, MD 10 mL/hr at 03/04/24 0923 New Bag at 03/04/24 8657    VITAL SIGNS: There were no vitals taken for this visit. There were no vitals filed for this visit.  Estimated body mass index is 23.43 kg/m as calculated from the following:   Height as of 03/18/24: 6\' 1"  (1.854 m).   Weight as of 03/18/24: 177 lb 9.6 oz (80.6 kg).  LABS: CBC:    Component Value Date/Time   WBC 5.8 04/03/2024 1312   WBC 9.3 09/06/2023 0424   HGB 11.2 (L) 04/03/2024 1312   HGB 13.3 02/14/2023 1510   HCT 32.4 (L) 04/03/2024 1312   HCT 38.7 02/14/2023 1510   PLT 192 04/03/2024 1312   PLT 218 02/14/2023 1510   MCV  94.5 04/03/2024 1312   MCV 90 02/14/2023 1510   NEUTROABS 2.8 04/03/2024 1312   LYMPHSABS 1.2 04/03/2024 1312   MONOABS 1.2 (H) 04/03/2024 1312   EOSABS 0.3 04/03/2024 1312   BASOSABS 0.1 04/03/2024 1312   Comprehensive Metabolic Panel:    Component Value Date/Time   NA 138 03/18/2024 0812   NA 138 03/22/2023 1030   K 3.6 03/18/2024 0812   CL 109 03/18/2024 0812   CO2 21 (L) 03/18/2024 0812   BUN 18 03/18/2024 0812   BUN 24 03/22/2023 1030   CREATININE 0.88 03/18/2024 0812   GLUCOSE 203 (H) 03/18/2024 0812   CALCIUM  8.9 03/18/2024 0812   AST 17 03/18/2024 0812   ALT 13 03/18/2024 0812   ALKPHOS 90 03/18/2024 0812   BILITOT 0.2 03/18/2024 0812   PROT 6.6 03/18/2024 0812   PROT 7.1 02/14/2023 1510   ALBUMIN 3.3 (L) 03/18/2024 0812   ALBUMIN 4.1 02/14/2023 1510    RADIOGRAPHIC STUDIES: No results found.  PERFORMANCE STATUS (ECOG) : 2 - Symptomatic, <50% confined to bed  Review of Systems Unless otherwise noted, a complete review of systems is negative.  Physical Exam General: NAD HEENT: Thrush Cardiovascular: regular rate and rhythm Pulmonary: Coarse anterior/posterior fields, cough Abdomen: soft, nontender, + bowel sounds GU: no  suprapubic tenderness Extremities: no edema, no joint deformities Skin: Erythematous rash legs and chest Neurological: Weakness but otherwise nonfocal    IMPRESSION/PLAN: Colon cancer -on adjuvant FOLFOX  URI -patient has cough, congestion, and shortness of breath.  Will obtain chest x-ray for further evaluation but suspect infectious etiology.  Low suspicion for PE as patient has no chest pain and is not hypoxic.  No evidence of CHF or volume overload.  Will start empirically on Augmentin  x 10 days.  Dehydration -mildly orthostatic in clinic.  Proceed with IV fluids  Rash -could be secondary to treatment versus viral syndrome.  Will start on prednisone  taper  Thrush -nystatin oral rinse  Case and plan discussed with Dr.  Valentine Gasmen.  Patient will have follow-up next week  Patient expressed understanding and was in agreement with this plan. He also understands that He can call clinic at any time with any questions, concerns, or complaints.   Thank you for allowing me to participate in the care of this very pleasant patient.   Time Total: 25 minutes  Visit consisted of counseling and education dealing with the complex and emotionally intense issues of symptom management in the setting of serious illness.Greater than 50%  of this time was spent counseling and coordinating care related to the above assessment and plan.  Signed by: Gerilyn Kobus, PhD, NP-C

## 2024-04-05 ENCOUNTER — Other Ambulatory Visit: Payer: Self-pay | Admitting: *Deleted

## 2024-04-05 DIAGNOSIS — C186 Malignant neoplasm of descending colon: Secondary | ICD-10-CM

## 2024-04-08 ENCOUNTER — Inpatient Hospital Stay

## 2024-04-08 ENCOUNTER — Inpatient Hospital Stay: Admitting: Internal Medicine

## 2024-04-09 ENCOUNTER — Other Ambulatory Visit: Payer: Self-pay

## 2024-04-09 ENCOUNTER — Inpatient Hospital Stay (HOSPITAL_BASED_OUTPATIENT_CLINIC_OR_DEPARTMENT_OTHER): Admitting: Hospice and Palliative Medicine

## 2024-04-09 ENCOUNTER — Ambulatory Visit

## 2024-04-09 ENCOUNTER — Inpatient Hospital Stay

## 2024-04-09 ENCOUNTER — Encounter: Payer: Self-pay | Admitting: Hospice and Palliative Medicine

## 2024-04-09 ENCOUNTER — Other Ambulatory Visit: Payer: Self-pay | Admitting: *Deleted

## 2024-04-09 VITALS — BP 129/79 | HR 85 | Temp 97.5°F | Resp 18 | Wt 166.0 lb

## 2024-04-09 DIAGNOSIS — C186 Malignant neoplasm of descending colon: Secondary | ICD-10-CM

## 2024-04-09 DIAGNOSIS — Z95828 Presence of other vascular implants and grafts: Secondary | ICD-10-CM

## 2024-04-09 DIAGNOSIS — E860 Dehydration: Secondary | ICD-10-CM | POA: Diagnosis not present

## 2024-04-09 LAB — CMP (CANCER CENTER ONLY)
ALT: 21 U/L (ref 0–44)
AST: 16 U/L (ref 15–41)
Albumin: 3.3 g/dL — ABNORMAL LOW (ref 3.5–5.0)
Alkaline Phosphatase: 97 U/L (ref 38–126)
Anion gap: 9 (ref 5–15)
BUN: 29 mg/dL — ABNORMAL HIGH (ref 8–23)
CO2: 22 mmol/L (ref 22–32)
Calcium: 8.8 mg/dL — ABNORMAL LOW (ref 8.9–10.3)
Chloride: 102 mmol/L (ref 98–111)
Creatinine: 0.97 mg/dL (ref 0.61–1.24)
GFR, Estimated: >60 mL/min (ref >60)
Glucose, Bld: 209 mg/dL — ABNORMAL HIGH (ref 70–99)
Potassium: 3.7 mmol/L (ref 3.5–5.1)
Sodium: 133 mmol/L — ABNORMAL LOW (ref 135–145)
Total Bilirubin: 0.6 mg/dL (ref 0.0–1.2)
Total Protein: 7.5 g/dL (ref 6.5–8.1)

## 2024-04-09 LAB — CBC WITH DIFFERENTIAL (CANCER CENTER ONLY)
Abs Immature Granulocytes: 2.29 10*3/uL — ABNORMAL HIGH (ref 0.00–0.07)
Basophils Absolute: 0.1 10*3/uL (ref 0.0–0.1)
Basophils Relative: 0 %
Eosinophils Absolute: 0.7 10*3/uL — ABNORMAL HIGH (ref 0.0–0.5)
Eosinophils Relative: 4 %
HCT: 33.9 % — ABNORMAL LOW (ref 39.0–52.0)
Hemoglobin: 11.6 g/dL — ABNORMAL LOW (ref 13.0–17.0)
Immature Granulocytes: 12 %
Lymphocytes Relative: 16 %
Lymphs Abs: 3 10*3/uL (ref 0.7–4.0)
MCH: 32.4 pg (ref 26.0–34.0)
MCHC: 34.2 g/dL (ref 30.0–36.0)
MCV: 94.7 fL (ref 80.0–100.0)
Monocytes Absolute: 1.6 10*3/uL — ABNORMAL HIGH (ref 0.1–1.0)
Monocytes Relative: 9 %
Neutro Abs: 11 10*3/uL — ABNORMAL HIGH (ref 1.7–7.7)
Neutrophils Relative %: 59 %
Platelet Count: 182 10*3/uL (ref 150–400)
RBC: 3.58 MIL/uL — ABNORMAL LOW (ref 4.22–5.81)
RDW: 12.1 % (ref 11.5–15.5)
Smear Review: NORMAL
WBC Count: 18.6 10*3/uL — ABNORMAL HIGH (ref 4.0–10.5)
nRBC: 0.3 % — ABNORMAL HIGH (ref 0.0–0.2)

## 2024-04-09 LAB — GENETIC SCREENING ORDER

## 2024-04-09 MED ORDER — HEPARIN SOD (PORK) LOCK FLUSH 100 UNIT/ML IV SOLN
500.0000 [IU] | Freq: Once | INTRAVENOUS | Status: AC
  Administered 2024-04-09: 500 [IU]
  Filled 2024-04-09: qty 5

## 2024-04-09 NOTE — Progress Notes (Signed)
 Symptom Management Clinic [facility] at [facility] [phone] [phone]  Patient Care Team: Mimi Alt, MD as PCP - General (Family Medicine) Rochell Chroman, RN as Oncology Nurse Navigator Gwyn Leos, MD as Consulting Physician (Oncology)   NAME OF PATIENT: Colin Griffin  [id number]  01/16/1951   DATE OF VISIT: 04/09/24  REASON FOR CONSULT: Colin Griffin is a 73 y.o. male with multiple medical problems including stage III colon cancer status post left hemicolectomy currently on adjuvant chemotherapy with FOLFOX.   INTERVAL HISTORY: Patient last saw Dr. Valentine Gasmen on 03/18/2024 for cycle 11 FOLFOX (of planned 12).  Patient seen in [facility] on 04/03/2024 with complaint of cough, respiratory congestion.  Also had sore throat and pruritic rash.  Patient presents to [facility] today for follow-up.  Today, patient states that he is feeling some improved.  He says his thrush has resolved and he is eating and drinking better.  His rash has also resolved.  He denies GI or GU symptoms.  No fever or chills.  He states that his shortness of breath is slightly improved but that he still has nonproductive cough and gets winded with exertion.  Denies chest pain.  Patient is still on antibiotics and steroids.  Denies chest pain. Denies any nausea, vomiting, constipation, or diarrhea. Denies urinary complaints. Patient offers no further specific complaints today.   PAST MEDICAL HISTORY: Past Medical History:  Diagnosis Date   Cancer [facility]) April 2024   Constipation 03/04/2023   Diabetes mellitus without complication [facility]) March 2024    PAST SURGICAL HISTORY:  Past Surgical History:  Procedure Laterality Date   APPENDECTOMY     BIOPSY  06/29/2023   Procedure: BIOPSY;  Surgeon: Normie Becton., MD;  Location: [facility];  Service: Gastroenterology;;   COLONOSCOPY WITH PROPOFOL  N/A 04/12/2023   Procedure: COLONOSCOPY  WITH PROPOFOL ;  Surgeon: Selena Daily, MD;  Location: [facility];  Service: Gastroenterology;  Laterality: N/A;   COLONOSCOPY WITH PROPOFOL  N/A 06/29/2023   Procedure: COLONOSCOPY WITH PROPOFOL ;  Surgeon: Mansouraty, Albino Alu., MD;  Location: [facility];  Service: Gastroenterology;  Laterality: N/A;   ENDOSCOPIC MUCOSAL RESECTION N/A 06/29/2023   Procedure: ENDOSCOPIC MUCOSAL RESECTION;  Surgeon: Brice Campi Albino Alu., MD;  Location: [facility];  Service: Gastroenterology;  Laterality: N/A;   FLEXIBLE SIGMOIDOSCOPY N/A 09/04/2023   Procedure: FLEXIBLE SIGMOIDOSCOPY;  Surgeon: Melvenia Stabs, MD;  Location: [facility];  Service: General;  Laterality: N/A;   HEMOSTASIS CONTROL  06/29/2023   Procedure: HEMOSTASIS CONTROL;  Surgeon: Normie Becton., MD;  Location: [facility];  Service: Gastroenterology;;   IR IMAGING GUIDED PORT INSERTION  09/26/2023   IR RADIOLOGIST EVAL & MGMT  03/16/2023   PELVIC ABCESS DRAINAGE     March 2024   POLYPECTOMY  06/29/2023   Procedure: POLYPECTOMY;  Surgeon: Mansouraty, Albino Alu., MD;  Location: [facility];  Service: Gastroenterology;;   [place] LIFTING INJECTION  06/29/2023   Procedure: SUBMUCOSAL LIFTING INJECTION;  Surgeon: Normie Becton., MD;  Location: [facility];  Service: Gastroenterology;;   SUBMUCOSAL TATTOO INJECTION  06/29/2023   Procedure: SUBMUCOSAL TATTOO INJECTION;  Surgeon: Normie Becton., MD;  Location: [facility];  Service: Gastroenterology;;   TRANSURETHRAL RESECTION OF PROSTATE N/A 03/03/2023   Procedure: TRANSURETHRAL RESECTION OF THE PROSTATE (TURP)/ UNROOFING OF PROSTATE ABSCESS;  Surgeon: Geraline Knapp, MD;  Location: [facility];  Service: Urology;  Laterality: N/A;    HEMATOLOGY/ONCOLOGY HISTORY:  Oncology History Overview Note  Colon cancer, status post a partial left colectomy 09/04/2023, stage IIIa (pT2 pN1b) CT abdomen/pelvis 03/01/2023-prostate/pelvic abscess, large amount of  colonic stool CT chest 03/30/2023-bilateral solid pulmonary nodules-likely benign, 39-month follow-up CT recommended Colonoscopy 04/12/2023-multiple polyps removed, greater than 50 mm polyp in the distal ascending colon and sigmoid colon not removed Colonoscopy 06/29/2023 greater than 50 mm polyps removed from the ascending, transverse, and descending colon-tubular adenomas, sigmoid lesion at 35 cm incompletely resected with biopsy revealing an ulcerated tubular adenoma with at least high-grade dysplasia and suspicious for invasive or [place] Descending colon resection 09/04/2023, pT2, PN 1B moderately differentiated adenocarcinoma, negative resection margins, lymphovascular invasion present, 3/11 lymph nodes, 2 tumor deposits, mismatch repair protein expression intact, MSS  Multiple colon polyps on colonoscopy 04/12/2023 and 06/29/2023-tubular adenomas Prostate cancer-Gleason 7 acinar adenocarcinoma involving 5-10% of submitted tissue from a TUR specimen 03/03/2023 Prostate 03/20/2023-mild prostamegaly- PI-RADS category 5 lesion in the left peripheral zone and category 4 lesion in the left anterior peripheral zone and left anterior fibromuscular stroma, reduced size of prostate abscess with continued surrounding prostatitis and periprostatic stranding IMRT 20 6/24 - 07/20/2023  4.  Diabetes 5.  History of colon cancer-negative CancerNext expanded panel 08/29/2023 6.  History of lung nodules, ongoing tobacco use, followed in the lung cancer screening clinic# prostate/pelvic abscess.  He was admitted for antibiotics and drainage of the pelvic abscess 03/01/2023. A TUR and was found to have a Gleason 7 prostate cancer involving 5-10% of the submitted tissue.  He was referred to Dr. Jacalyn Martin and completed external beam radiation to the prostate.  # MAY 2024 [screening colo]- A greater than 50 mm polyp was removed from the proximal ascending colon.  Additional greater than 50 m polyps were not removed from the distal  ascending and sigmoid colon.  Additional smaller polyps were removed from the colon. The pathology revealed multiple fragments of tubular adenomas. Dr. Ignacia Maki than 50 mm polyps were removed from the ascending, transverse, and descending colon.  A polypoid lesion at 35 cm was incompletely resected and tattooed.  The pathology from multiple polyps returned as tubular adenomas without high-grade dysplasia or carcinoma.  # OCTOBER 2024- STAGE III SIGMOID COLON CANCER [Dr.Sherril/Dr.White- [place]] pathology from the sigmoid resection returned as invasive moderately differentiated adenocarcinoma involving the descending colon.  Carcinoma invades into but not through the muscularis propria.  The resection margins are negative.  Lymphovascular invasion is present.  Metastatic carcinoma was identified in 3 of 11 lymph nodes and there were 2 tumor deposits.  Mismatch repair protein expression is intact.   # Multiple colon polyps on colonoscopy 04/12/2023 and 06/29/2023-tubular adenomas- s/p  Genetics counseling: negative CancerNext expanded panel 08/29/2023  # NOV 4th, 2024- FOLFOX q 2 w x 6 months.   # APRIL 2024-  Prostate cancer [s/p EBRT- Dr.Chrystal]-    Cancer of descending colon (HCC)  09/19/2023 Initial Diagnosis   Cancer of descending colon (HCC)   09/19/2023 Cancer Staging   Staging form: Colon and Rectum, AJCC 8th Edition - Pathologic: pT2, cM0 - Signed by Sumner Ends, MD on 09/19/2023 Histologic grading system: 4 grade system Histologic grade (G): G2 Residual tumor (R): R0 - None Laterality: Left Lymph-vascular invasion (LVI): LVI present/identified, NOS Tumor deposits (TD): Present Perineural invasion (PNI): Absent Microsatellite instability (MSI): Stable   09/19/2023 Cancer Staging   Staging form: Colon and Rectum, AJCC 8th Edition - Pathologic: Stage IIIA (pT2, pN1b, cM0) - Signed by Sumner Ends, MD on 09/19/2023   10/11/2023 -  Chemotherapy  Patient is on  Treatment Plan : COLORECTAL FOLFOX q14d x 6 months       ALLERGIES:  has no known allergies.  MEDICATIONS:  Current Outpatient Medications  Medication Sig Dispense Refill   albuterol  (VENTOLIN  HFA) 108 (90 Base) MCG/ACT inhaler Inhale 2 puffs into the lungs every 6 (six) hours as needed for wheezing or shortness of breath. 8 g 2   amoxicillin -clavulanate (AUGMENTIN ) 875-125 MG tablet Take 1 tablet by mouth 2 (two) times daily. 20 tablet 0   Continuous Glucose Sensor (FREESTYLE LIBRE 3 SENSOR) MISC PLACE 1 SENSOR ON THE SKIN EVERY 14 DAYS. USE TO CHECK GLUCOSE CONTINUOUSLY 2 each 6   Dulaglutide  (TRULICITY ) 0.75 MG/0.5ML SOAJ INJECT 0.75 MG SUBCUTANEOUSLY ONE TIME PER WEEK 6 mL 3   Fluticasone-Umeclidin-Vilant (TRELEGY ELLIPTA ) 200-62.5-25 MCG/ACT AEPB Inhale 1 Act into the lungs daily. 1 each 6   lidocaine -prilocaine  (EMLA ) cream Apply 1 Application topically as needed. 30 g 1   metFORMIN  (GLUCOPHAGE -XR) 500 MG 24 hr tablet TAKE 1 TABLET BY MOUTH EVERY DAY WITH BREAKFAST 90 tablet 1   nystatin  (MYCOSTATIN ) 100000 UNIT/ML suspension Take 5 mLs (500,000 Units total) by mouth 4 (four) times daily. 60 mL 0   predniSONE  (DELTASONE ) 20 MG tablet Take 3 tablets (60 mg) x 3 days, then take 2 tablets (40 mg) x 3 days, then take 1 tablet) 20 mg) x 3 days 20 tablet 0   ferrous sulfate  325 (65 FE) MG tablet Take 1 tablet (325 mg total) by mouth 2 (two) times daily with a meal. (Patient not taking: Reported on 04/03/2024) 60 tablet 0   ondansetron  (ZOFRAN ) 8 MG tablet One pill every 8 hours as needed for nausea/vomitting. (Patient not taking: Reported on 04/09/2024) 40 tablet 1   No current facility-administered medications for this visit.   Facility-Administered Medications Ordered in Other Visits  Medication Dose Route Frequency Provider Last Rate Last Admin   dextrose  5 % solution   Intravenous Continuous Brahmanday, Govinda R, MD 10 mL/hr at 03/04/24 0923 New Bag at 03/04/24 0923    VITAL SIGNS: BP  129/79 (BP Location: Left Arm, Patient Position: Sitting, Cuff Size: Normal)   Pulse 85   Temp (!) 97.5 F (36.4 C) (Tympanic)   Resp 18   Wt 166 lb (75.3 kg)   SpO2 95%   BMI 21.90 kg/m  Filed Weights   04/09/24 1121  Weight: 166 lb (75.3 kg)    Estimated body mass index is 21.9 kg/m as calculated from the following:   Height as of 04/03/24: 6\' 1"  (1.854 m).   Weight as of this encounter: 166 lb (75.3 kg).  LABS: CBC:    Component Value Date/Time   WBC 18.6 (H) 04/09/2024 1059   WBC 9.3 09/06/2023 0424   HGB 11.6 (L) 04/09/2024 1059   HGB 13.3 02/14/2023 1510   HCT 33.9 (L) 04/09/2024 1059   HCT 38.7 02/14/2023 1510   PLT 182 04/09/2024 1059   PLT 218 02/14/2023 1510   MCV 94.7 04/09/2024 1059   MCV 90 02/14/2023 1510   NEUTROABS PENDING 04/09/2024 1059   LYMPHSABS PENDING 04/09/2024 1059   MONOABS PENDING 04/09/2024 1059   EOSABS PENDING 04/09/2024 1059   BASOSABS PENDING 04/09/2024 1059   Comprehensive Metabolic Panel:    Component Value Date/Time   NA 133 (L) 04/03/2024 1312   NA 138 03/22/2023 1030   K 3.5 04/03/2024 1312   CL 102 04/03/2024 1312   CO2 21 (L) 04/03/2024 1312   BUN 22  04/03/2024 1312   BUN 24 03/22/2023 1030   CREATININE 1.07 04/03/2024 1312   GLUCOSE 171 (H) 04/03/2024 1312   CALCIUM  8.5 (L) 04/03/2024 1312   AST 13 (L) 04/03/2024 1312   ALT 17 04/03/2024 1312   ALKPHOS 100 04/03/2024 1312   BILITOT 0.2 04/03/2024 1312   PROT 7.2 04/03/2024 1312   PROT 7.1 02/14/2023 1510   ALBUMIN 3.1 (L) 04/03/2024 1312   ALBUMIN 4.1 02/14/2023 1510    RADIOGRAPHIC STUDIES: DG Chest 2 View Result Date: 04/03/2024 CLINICAL DATA:  Shortness of breath and cough. EXAM: CHEST - 2 VIEW COMPARISON:  March 01, 2023 FINDINGS: A right-sided venous Port-A-Cath is seen with its distal tip noted near the junction of the superior vena cava and right atrium. The heart size and mediastinal contours are within normal limits. There is evidence of emphysematous lung  disease with moderate severity pulmonary fibrotic changes seen along the periphery of the mid lung fields and bilateral lung bases. Moderate severity superimposed areas of atelectasis and/or infiltrate are also seen within these regions. No pleural effusion or pneumothorax is identified. The visualized skeletal structures are unremarkable. IMPRESSION: 1. Emphysematous lung disease with moderate severity pulmonary fibrotic changes. 2. Moderate severity superimposed areas of atelectasis and/or infiltrate within the mid lung fields and bilateral lung bases. Electronically Signed   By: Virgle Grime M.D.   On: 04/03/2024 16:58    PERFORMANCE STATUS (ECOG) : 2 - Symptomatic, <50% confined to bed  Review of Systems Unless otherwise noted, a complete review of systems is negative.  Physical Exam General: NAD HEENT: No thrush Cardiovascular: regular rate and rhythm Pulmonary: Expiratory wheezing right side but otherwise clear Abdomen: soft, nontender, + bowel sounds GU: no suprapubic tenderness Extremities: no edema, no joint deformities Skin: No rash Neurological: Weakness but otherwise nonfocal  IMPRESSION/PLAN: Colon cancer -status post adjuvant FOLFOX.  Patient would like to forego further chemotherapy.  Will plan follow-up with Dr. Valentine Gasmen in 6 weeks.  URI -symptoms appear to be improving.  Chest x-ray showed possible fibrotic changes in addition to atelectasis versus infiltrate bilaterally.  Recommend completing course of Augmentin  and prednisone .  Recommend follow-up with pulmonology.  Case and plan discussed with Dr. Valentine Gasmen.  Follow-up in 6 weeks for labs/MD.  Patient expressed understanding and was in agreement with this plan. He also understands that He can call clinic at any time with any questions, concerns, or complaints.   Thank you for allowing me to participate in the care of this very pleasant patient.   Time Total: 15 minutes  Visit consisted of counseling and  education dealing with the complex and emotionally intense issues of symptom management in the setting of serious illness.Greater than 50%  of this time was spent counseling and coordinating care related to the above assessment and plan.  Signed by: Gerilyn Kobus, PhD, NP-C

## 2024-04-10 ENCOUNTER — Encounter

## 2024-04-15 ENCOUNTER — Ambulatory Visit: Payer: Self-pay | Admitting: Internal Medicine

## 2024-04-15 DIAGNOSIS — J441 Chronic obstructive pulmonary disease with (acute) exacerbation: Secondary | ICD-10-CM

## 2024-04-15 MED ORDER — METHYLPREDNISOLONE 4 MG PO TBPK: ORAL_TABLET | ORAL | 0 refills | Status: DC

## 2024-04-15 MED ORDER — AZITHROMYCIN 500 MG PO TABS: 500.0000 mg | ORAL_TABLET | Freq: Every day | ORAL | 0 refills | Status: AC

## 2024-04-15 NOTE — Telephone Encounter (Signed)
 Copied from CRM [id number]. Topic: Clinical - Red Word Triage >> Apr 15, 2024  2:30 PM Ilene Malling wrote: Red Word that prompted transfer to Nurse Triage: Patient [phone] states symptoms are worsening, cannot get deep breaths in, shortness of breath, and wheezing. Patient states still having sore and pain in the chest area, due to passing out and hitting his chest on the stairs about 2 weeks ago. Patient denies dizziness, headaches, nor fever. Patient finished chemo treatment 2 weeks ago. Patient has an appointment with Dr. Auston Left for 04/17/24 but wants to see if something can be prescribe since symptoms have worsen. Please advise.   [id number] - [place], [place] -  [address] [place] [address] Phone: [phone] Fax: [phone]  TRIAGE SUMMARY NOTE: Pt reporting that he finished prednisone  and amoxicillin  3 days ago, had been clearing up but now symptoms returning, including more SOB than usual with exertion, post-nasal drip, coughing up bits of yellow phlegm but difficult to cough anything up, wheezing and rattling intermittently but can "cough it loose." Pt confirms that his albuterol  inhaler helps him with use 2x/day. Advised pt be examined in next 4 hours, offered appts at other pulm office today, pt declines due to proximity, advised that sending HP message to office for call back with further recommendations, placed Wednesday appt on waitlist but likely needs in-person exam. Advised call back if worsening. Pt verbalized understanding. Please advise.  E2C2 Pulmonary Triage - Initial Assessment Questions "Chief Complaint (e.g., cough, sob, wheezing, fever, chills, sweat or additional symptoms) *Go to specific symptom protocol after initial questions. Dr. 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 B could hear in right lower lobe some fluid last week  "Have you used your inhalers/maintenance medication?" Yes If yes, "What medications?" Rescue inhaler 2x/day trelegy  OXYGEN: "Do you wear supplemental oxygen?" No  "Do you monitor your oxygen levels?" No  Reason for Disposition  [1] MILD difficulty breathing (e.g., minimal/no SOB at rest, SOB with walking, pulse <100) AND [2] NEW-onset or WORSE than normal  Protocols used: Breathing Difficulty-A-AH

## 2024-04-15 NOTE — Telephone Encounter (Signed)
 I have notified the patient. Nothing further needed.

## 2024-04-15 NOTE — Telephone Encounter (Signed)
 Dr. Auston Left is out of the office. Dr. Viva Grise please advise.

## 2024-04-15 NOTE — Telephone Encounter (Signed)
 He has upcoming appointment with Dr. Auston Left on Wednesday.  Recommend using his albuterol  at least 4 times a day while he has these issues.  We will call in a prescriptions to his pharmacy of Azithromycin  and Medrol  Dosepak.  He needs to quit smoking as the symptoms will continue to recur as long as he is smoking.  If symptoms are not any better with the above medications he needs to be seen in the emergency room.

## 2024-04-17 ENCOUNTER — Encounter: Payer: Self-pay | Admitting: Internal Medicine

## 2024-04-17 ENCOUNTER — Ambulatory Visit: Admitting: Internal Medicine

## 2024-04-17 VITALS — BP 118/70 | HR 86 | Temp 97.8°F | Ht 73.0 in | Wt 167.6 lb

## 2024-04-17 DIAGNOSIS — J479 Bronchiectasis, uncomplicated: Secondary | ICD-10-CM | POA: Diagnosis not present

## 2024-04-17 DIAGNOSIS — J449 Chronic obstructive pulmonary disease, unspecified: Secondary | ICD-10-CM

## 2024-04-17 DIAGNOSIS — F17210 Nicotine dependence, cigarettes, uncomplicated: Secondary | ICD-10-CM

## 2024-04-17 DIAGNOSIS — J439 Emphysema, unspecified: Secondary | ICD-10-CM | POA: Diagnosis not present

## 2024-04-17 MED ORDER — TRELEGY ELLIPTA 200-62.5-25 MCG/ACT IN AEPB: 1.0000 | INHALATION_SPRAY | Freq: Every day | RESPIRATORY_TRACT | Status: DC

## 2024-04-17 MED ORDER — NICOTINE 21 MG/24HR TD PT24: 21.0000 mg | MEDICATED_PATCH | TRANSDERMAL | 1 refills | Status: DC

## 2024-04-17 NOTE — Patient Instructions (Addendum)
 Continue Trelegy inhaler as prescribed Please rinse mouth after use  Avoid Allergens and Irritants Avoid secondhand smoke Avoid SICK contacts Recommend  Masking  when appropriate Recommend Keep up-to-date with vaccinations  Start using Nicotine patches Please stop Smoking and Vaping  Please call us  back when you are feeling better so we can obtain pulmonary function testing and overnight oxygen testing

## 2024-04-17 NOTE — Progress Notes (Signed)
 [facility] Pulmonary Medicine Consultation      Date: 04/17/2024,   MRN# [id number] Colin Griffin 1951-02-27    CHIEF COMPLAINT:   Assessment of COPD   HISTORY OF PRESENT ILLNESS   73 year old pleasant white male seen today for ongoing symptoms of shortness of breath increased cough and wheezing Continues to smoke  Patient has increased work of breathing and shortness of breath with exertion Also associated with productive cough with wheezing This has been going on for last couple of months   Patient has history of prostate cancer Patient has a history of colon cancer  CT of the chest reviewed  Patient is enrolled in lung cancer screening program Patient does have interstitial lung disease as well as areas of emphysema I will need pulmonary function test to assess lung function  Ambulating pulse oximetry in the office last visit did not show any significant hypoxia We started Trelegy inhaler therapy   No exacerbation at this time No evidence of heart failure at this time No evidence or signs of infection at this time No respiratory distress No fevers, chills, nausea, vomiting, diarrhea No evidence of lower extremity edema No evidence hemoptysis    Oncological history reviewed Cancer of descending colon (HCC) # OCT 2024-Colon cancer, status post a partial left colectomy 09/04/2023, stage IIIa   # 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 involving 5-10% of submitted tissue from a TUR specimen 03/03/2023  History of Lung Nodules CME   PAST MEDICAL HISTORY   Past Medical History:  Diagnosis Date   Cancer [facility]) April 2024   Constipation 03/04/2023   Diabetes mellitus without complication [facility]) March 2024     SURGICAL HISTORY   Past Surgical History:  Procedure Laterality Date   APPENDECTOMY     BIOPSY  06/29/2023   Procedure: BIOPSY;  Surgeon: Normie Becton., MD;  Location: [facility];  Service: Gastroenterology;;    COLONOSCOPY WITH PROPOFOL  N/A 04/12/2023   Procedure: COLONOSCOPY WITH PROPOFOL ;  Surgeon: Selena Daily, MD;  Location: [facility];  Service: Gastroenterology;  Laterality: N/A;   COLONOSCOPY WITH PROPOFOL  N/A 06/29/2023   Procedure: COLONOSCOPY WITH PROPOFOL ;  Surgeon: Mansouraty, Albino Alu., MD;  Location: [facility];  Service: Gastroenterology;  Laterality: N/A;   ENDOSCOPIC MUCOSAL RESECTION N/A 06/29/2023   Procedure: ENDOSCOPIC MUCOSAL RESECTION;  Surgeon: Brice Campi Albino Alu., MD;  Location: [facility];  Service: Gastroenterology;  Laterality: N/A;   FLEXIBLE SIGMOIDOSCOPY N/A 09/04/2023   Procedure: FLEXIBLE SIGMOIDOSCOPY;  Surgeon: Melvenia Stabs, MD;  Location: [facility];  Service: General;  Laterality: N/A;   HEMOSTASIS CONTROL  06/29/2023   Procedure: HEMOSTASIS CONTROL;  Surgeon: Normie Becton., MD;  Location: [facility];  Service: Gastroenterology;;   IR IMAGING GUIDED PORT INSERTION  09/26/2023   IR RADIOLOGIST EVAL & MGMT  03/16/2023   PELVIC ABCESS DRAINAGE     March 2024   POLYPECTOMY  06/29/2023   Procedure: POLYPECTOMY;  Surgeon: Mansouraty, Albino Alu., MD;  Location: [facility];  Service: Gastroenterology;;   [place] LIFTING INJECTION  06/29/2023   Procedure: SUBMUCOSAL LIFTING INJECTION;  Surgeon: Normie Becton., MD;  Location: [facility];  Service: Gastroenterology;;   SUBMUCOSAL TATTOO INJECTION  06/29/2023   Procedure: SUBMUCOSAL TATTOO INJECTION;  Surgeon: Normie Becton., MD;  Location: [facility];  Service: Gastroenterology;;   TRANSURETHRAL RESECTION OF PROSTATE N/A 03/03/2023   Procedure: TRANSURETHRAL RESECTION OF THE PROSTATE (TURP)/ UNROOFING OF PROSTATE ABSCESS;  Surgeon: Geraline Knapp, MD;  Location: [facility];  Service:  Urology;  Laterality: N/A;     FAMILY HISTORY   Family History  Problem Relation Age of Onset   Atrial fibrillation Mother    Skin cancer Father        non-melanoma   Colon cancer  Sister 32       Lynch Syndrome   Breast cancer Sister 21   Diabetes Brother    Throat cancer Maternal Uncle    Breast cancer Paternal Aunt 36 - 36   Colon cancer Maternal Grandmother 86   Alcohol abuse Maternal Grandfather    Breast cancer Paternal Grandmother 24   Prostate cancer Paternal Grandfather 32 - 42   Other Niece        2 nieces have Lynch Syndrome (brother's daughters)   Diabetes Other    Ovarian cancer Other 49       paternal first cousin once removed   Colon polyps Neg Hx    Stomach cancer Neg Hx    Esophageal cancer Neg Hx    Inflammatory bowel disease Neg Hx    Liver disease Neg Hx    Pancreatic cancer Neg Hx      SOCIAL HISTORY   Social History   Tobacco Use   Smoking status: Every Day    Current packs/day: 1.50    Average packs/day: 1.5 packs/day for 54.4 years (81.6 ttl pk-yrs)    Types: Cigarettes    Start date: 54   Smokeless tobacco: Never   Tobacco comments:    Reports that at one time he smoked 3 packs per day while in the [employer]  Vaping Use   Vaping status: Every Day   Start date: 08/08/2022   Substances: Flavoring  Substance Use Topics   Alcohol use: Not Currently   Drug use: Never     MEDICATIONS    Home Medication:  Current Outpatient Rx   Order #: [id number] Class: Normal   Order #: [id number] Class: Normal   Order #: [id number] Class: Normal   Order #: [id number] Class: Normal   Order #: [id number] Class: Normal   Order #: [id number] Class: Normal   Order #: [id number] Class: Normal   Order #: [id number] Class: Normal   Order #: [id number] Class: Normal   Order #: [id number] Class: Normal   Order #: [id number] Class: Normal   Order #: [id number] Class: Normal    Current Medication:  Current Outpatient Medications:    albuterol  (VENTOLIN  HFA) 108 (90 Base) MCG/ACT inhaler, Inhale 2 puffs into the lungs every 6 (six) hours as needed for wheezing or shortness of breath., Disp: 8 g, Rfl: 2   amoxicillin -clavulanate (AUGMENTIN ) 875-125 MG  tablet, Take 1 tablet by mouth 2 (two) times daily., Disp: 20 tablet, Rfl: 0   azithromycin  (ZITHROMAX ) 500 MG tablet, Take 1 tablet (500 mg total) by mouth daily for 3 days. Take 1 tablet daily for 3 days., Disp: 3 tablet, Rfl: 0   Continuous Glucose Sensor (FREESTYLE LIBRE 3 SENSOR) MISC, PLACE 1 SENSOR ON THE SKIN EVERY 14 DAYS. USE TO CHECK GLUCOSE CONTINUOUSLY, Disp: 2 each, Rfl: 6   Dulaglutide  (TRULICITY ) 0.75 MG/0.5ML SOAJ, INJECT 0.75 MG SUBCUTANEOUSLY ONE TIME PER WEEK, Disp: 6 mL, Rfl: 3   ferrous sulfate  325 (65 FE) MG tablet, Take 1 tablet (325 mg total) by mouth 2 (two) times daily with a meal. (Patient not taking: Reported on 04/03/2024), Disp: 60 tablet, Rfl: 0   Fluticasone-Umeclidin-Vilant (TRELEGY ELLIPTA ) 200-62.5-25 MCG/ACT AEPB, Inhale 1 Act into the lungs daily., Disp: 1 each, Rfl: 6   lidocaine -prilocaine  (EMLA ) cream, Apply 1 Application topically as  needed., Disp: 30 g, Rfl: 1   metFORMIN  (GLUCOPHAGE -XR) 500 MG 24 hr tablet, TAKE 1 TABLET BY MOUTH EVERY DAY WITH BREAKFAST, Disp: 90 tablet, Rfl: 1   methylPREDNISolone  (MEDROL  DOSEPAK) 4 MG TBPK tablet, Take as directed in the package, this is a taper pack., Disp: 21 tablet, Rfl: 0   nystatin  (MYCOSTATIN ) 100000 UNIT/ML suspension, Take 5 mLs (500,000 Units total) by mouth 4 (four) times daily., Disp: 60 mL, Rfl: 0   ondansetron  (ZOFRAN ) 8 MG tablet, One pill every 8 hours as needed for nausea/vomitting. (Patient not taking: Reported on 04/09/2024), Disp: 40 tablet, Rfl: 1 No current facility-administered medications for this visit.  Facility-Administered Medications Ordered in Other Visits:    dextrose  5 % solution, , Intravenous, Continuous, Gwyn Leos, MD, Last Rate: 10 mL/hr at 03/04/24 0923, New Bag at 03/04/24 0923   BP 118/70 (BP Location: Right Arm, Patient Position: Sitting, Cuff Size: Normal)   Pulse 86   Temp 97.8 F (36.6 C) (Oral)   Ht 6\' 1"  (1.854 m)   Wt 167 lb 9.6 oz (76 kg)   SpO2 97%   BMI  22.11 kg/m      Review of Systems: Gen:  Denies  fever, sweats, chills weight loss  HEENT: Denies blurred vision, double vision, ear pain, eye pain, hearing loss, nose bleeds, sore throat Cardiac:  No dizziness, chest pain or heaviness, chest tightness,edema, No JVD Resp:   No cough, -sputum production, -shortness of breath,-wheezing, -hemoptysis,  Other:  All other systems negative   Physical Examination:   General Appearance: No distress  EYES PERRLA, EOM intact.   NECK Supple, No JVD Pulmonary: normal breath sounds, No wheezing.  CardiovascularNormal S1,S2.  No m/r/g.   Abdomen: Benign, Soft, non-tender. Neurology UE/LE 5/5 strength, no focal deficits Ext pulses intact, cap refill intact ALL OTHER ROS ARE NEGATIVE     IMAGING   CT of the chest 2023 reviewed in detail with patient today Areas of bronchiectasis areas of interstitial lung disease b/l  LLL Nodules 7.2MM, no masses seen     ASSESSMENT/PLAN  73 year old pleasant white male seen today for assessment of COPD in the setting of extensive smoking history 1 pack a day for the last 50 years with abnormal CT chest findings with interstitial lung disease bronchiectasis and then some emphysematous changes with ongoing chemotherapy with colon cancer and deconditioned state  Assessment of COPD Pulmonary function testing are pending Continue Trelegy inhaler 201 puff daily Rinse mouth after use Continue albuterol  as needed Avoid Allergens and Irritants Avoid secondhand smoke Avoid SICK contacts Recommend  Masking  when appropriate Recommend Keep up-to-date with vaccinations Patient was given prednisone  and antibiotics last visit    Shortness of breath and dyspnea exertion Ambulatory pulse ox in the office did not reveal any significant hypoxia Will obtain overnight pulse oximetry to assess for nocturnal hypoxia-PATIENT STATES THAT SINCE HE FELL THAT OXYGEN TEST WILL BE INACCUARTE REFUSING TESTING AT THIS  TIME   Abnormal CT chest Areas of interstitial lung disease emphysema and bronchiectasis Most of his disease is caused by smoking  Bronchiectasis with chronic bronchitis Recommend exercise as tolerated Will discuss incentive spirometry and flutter valve at next office visit if inhaler therapy is suboptimal   Smoking history-follow-up lung cancer screening protocol CT scans of the chest reviewed in detail with patient Follow-up annually   Smoking Assessment and Cessation Counseling Upon further questioning, Patient smokes 1/2 ppd I have advised patient to quit/stop smoking as soon as possible due  to high risk for multiple medical problems Patient is willing to quit smoking I have advised patient that we can assist and have options of Nicotine replacement therapy. I also advised patient on behavioral therapy and can provide oral medication therapy in conjunction with the other therapies Follow up next Office visit  for assessment of smoking cessation Smoking cessation counseling advised for >10 minutes     There are several tests that I have recommended and have not been completed. Patient is at MODERATE risk at this time for infections, pneumonia, lung collapse, increased levels of oxygen requirements Patient continues to smoke  Probable risk factors include smoking within 8 weeks General Risk Reduction Strategies: - All patients warrant post-operative incentive spirometry. For those with obstruction, also consider flutter valve. - Early ambulation, PT/OT - DVT prophylaxis where appropriate - Adequate pain control without oversedation   MEDICATION ADJUSTMENTS/LABS AND TESTS ORDERED: Recommend stop smoking Trelegy one  puff daily Check overnight pulse oximetry-Call us  when ready pulmonary function test -call us  when ready Avoid Allergens and Irritants Avoid secondhand smoke Avoid SICK contacts Recommend  Masking  when appropriate Recommend Keep up-to-date with  vaccinations Follow-up lung cancer screening protocol  CURRENT MEDICATIONS REVIEWED AT LENGTH WITH PATIENT TODAY   Patient  satisfied with Plan of action and management. All questions answered  Follow up  6 months  I spent a total of 42 minutes reviewing chart data, face-to-face evaluation with the patient, counseling and coordination of care as detailed above.     Lady Pier, M.D.  [facility]    Medical Director [facility] Medical Director [facility] Cardio-Pulmonary Department

## 2024-04-23 ENCOUNTER — Telehealth: Payer: Self-pay

## 2024-04-23 ENCOUNTER — Other Ambulatory Visit: Payer: Self-pay

## 2024-04-23 DIAGNOSIS — D125 Benign neoplasm of sigmoid colon: Secondary | ICD-10-CM

## 2024-04-23 DIAGNOSIS — Z800 Family history of malignant neoplasm of digestive organs: Secondary | ICD-10-CM

## 2024-04-23 DIAGNOSIS — D122 Benign neoplasm of ascending colon: Secondary | ICD-10-CM

## 2024-04-23 DIAGNOSIS — C186 Malignant neoplasm of descending colon: Secondary | ICD-10-CM

## 2024-04-23 DIAGNOSIS — Z860101 Personal history of adenomatous and serrated colon polyps: Secondary | ICD-10-CM

## 2024-04-23 DIAGNOSIS — J449 Chronic obstructive pulmonary disease, unspecified: Secondary | ICD-10-CM

## 2024-04-23 MED ORDER — NA SULFATE-K SULFATE-MG SULF 17.5-3.13-1.6 GM/177ML PO SOLN: 1.0000 | ORAL | 0 refills | Status: DC

## 2024-04-23 NOTE — Telephone Encounter (Signed)
 I have called the patient. I did scheduled him to have PFTs done on 6/3.   Holding this message until his PFT is complete.

## 2024-04-23 NOTE — Telephone Encounter (Signed)
-----   Message from [facility] sent at 03/04/2024  3:01 PM EDT ----- Regarding: RE: Follow-up [facility], Thanks for the update. Will plan to get him in this summer.  Kerrigan Glendening, Colonoscopy with EMR follow-up 90-minute case to be pursued in July would work. Thanks. GM ----- Message ----- From: Gwyn Leos, MD Sent: 03/04/2024   2:45 PM EDT To: Normie Becton., MD Subject: RE: Follow-up                                  Thank you for reaching out to me.  Patient will need surveillance colonoscopies.  He is finishing his adjuvant therapy in May 2 week.  Hopefully a month or so later he can be scheduled for colonoscopy.   GB ----- Message ----- From: Normie Becton., MD Sent: 03/04/2024   2:43 PM EDT To: Gwyn Leos, MD Subject: Follow-up                                      GR[facility], I want to confirm that the patient is still a candidate for colonoscopy for surveillance of his previous large polyp resections but also history of the previously resected colon cancer via surgery. If you believe that he still is, then I can schedule him for colonoscopy follow-up, or with the rest of his other medical issues, if he is felt not to be a candidate for that then please let me know. Thanks. GM

## 2024-04-23 NOTE — Telephone Encounter (Signed)
 Patient is scheduled for 06/25/24 @ [facility]. Patient request that all instructions be mailed to him and he will call with any questions. Pulmonary Clearance also faxed to [facility]. Pre-visit telephone visit scheduled for 06/11/24 at 10:00 am with pre-visit nurse.

## 2024-04-23 NOTE — Telephone Encounter (Signed)
 Patient is schedule for a colonoscopy on 06/25/2024 at [facility]. He will need a surgical clearance. He was last seen on 04/17/2024.

## 2024-04-24 ENCOUNTER — Other Ambulatory Visit: Payer: Self-pay

## 2024-04-24 ENCOUNTER — Encounter: Payer: Self-pay | Admitting: Internal Medicine

## 2024-04-30 ENCOUNTER — Ambulatory Visit: Admitting: Internal Medicine

## 2024-04-30 DIAGNOSIS — J449 Chronic obstructive pulmonary disease, unspecified: Secondary | ICD-10-CM | POA: Diagnosis not present

## 2024-04-30 LAB — PULMONARY FUNCTION TEST
DL/VA % pred: 65 %
DL/VA: 2.6 ml/min/mmHg/L
DLCO unc % pred: 40 %
DLCO unc: 11.47 ml/min/mmHg
FEF 25-75 Pre: 1.2 L/s
FEF2575-%Pred-Pre: 43 %
FEV1-%Pred-Pre: 48 %
FEV1-Pre: 1.77 L
FEV1FVC-%Pred-Pre: 94 %
FEV6-%Pred-Pre: 54 %
FEV6-Pre: 2.54 L
FEV6FVC-%Pred-Pre: 104 %
FVC-%Pred-Pre: 51 %
FVC-Pre: 2.55 L
Pre FEV1/FVC ratio: 69 %
Pre FEV6/FVC Ratio: 99 %
RV % pred: 82 %
RV: 2.15 L
TLC % pred: 63 %
TLC: 4.85 L

## 2024-04-30 NOTE — Patient Instructions (Signed)
 Full PFT completed without post.

## 2024-04-30 NOTE — Progress Notes (Signed)
 Full PFT completed without post due to pt not able to get ATS on FVC because of coughing.

## 2024-05-01 ENCOUNTER — Ambulatory Visit: Payer: Self-pay | Admitting: Internal Medicine

## 2024-05-01 LAB — SIGNATERA
SIGNATERA MTM READOUT: 0 MTM/ml
SIGNATERA TEST RESULT: NEGATIVE

## 2024-05-01 NOTE — Telephone Encounter (Signed)
 Dr. Auston Left, the patient had his PFT yesterday. Can you look and them and addend your last office note if needed for his surgical clearance?

## 2024-05-02 ENCOUNTER — Encounter: Payer: Self-pay | Admitting: Internal Medicine

## 2024-05-02 ENCOUNTER — Ambulatory Visit: Admitting: Internal Medicine

## 2024-05-03 ENCOUNTER — Encounter: Payer: Self-pay | Admitting: Internal Medicine

## 2024-05-03 ENCOUNTER — Other Ambulatory Visit: Payer: Self-pay | Admitting: Internal Medicine

## 2024-05-03 DIAGNOSIS — J441 Chronic obstructive pulmonary disease with (acute) exacerbation: Secondary | ICD-10-CM

## 2024-05-03 MED ORDER — PREDNISONE 20 MG PO TABS: 20.0000 mg | ORAL_TABLET | Freq: Every day | ORAL | 1 refills | Status: DC

## 2024-05-03 NOTE — Progress Notes (Signed)
Pred 20 mg daily for 10 days

## 2024-05-06 ENCOUNTER — Encounter (HOSPITAL_COMMUNITY): Payer: Self-pay

## 2024-05-06 NOTE — Telephone Encounter (Signed)
 Clearance has been faxed.  Nothing further needed.

## 2024-05-15 ENCOUNTER — Ambulatory Visit: Admitting: Internal Medicine

## 2024-05-16 ENCOUNTER — Other Ambulatory Visit: Payer: Self-pay | Admitting: *Deleted

## 2024-05-16 ENCOUNTER — Encounter: Payer: Self-pay | Admitting: Radiation Oncology

## 2024-05-16 ENCOUNTER — Ambulatory Visit
Admission: RE | Admit: 2024-05-16 | Discharge: 2024-05-16 | Disposition: A | Discharge status: Home / Self Care | Payer: Medicare PPO | Source: Ambulatory Visit | Attending: Radiation Oncology | Admitting: Radiation Oncology

## 2024-05-16 ENCOUNTER — Inpatient Hospital Stay: Attending: Oncology

## 2024-05-16 VITALS — BP 128/83 | Temp 97.4°F | Resp 20 | Wt 167.0 lb

## 2024-05-16 DIAGNOSIS — Z9049 Acquired absence of other specified parts of digestive tract: Secondary | ICD-10-CM | POA: Insufficient documentation

## 2024-05-16 DIAGNOSIS — R0989 Other specified symptoms and signs involving the circulatory and respiratory systems: Secondary | ICD-10-CM | POA: Diagnosis not present

## 2024-05-16 DIAGNOSIS — T451X5A Adverse effect of antineoplastic and immunosuppressive drugs, initial encounter: Secondary | ICD-10-CM | POA: Insufficient documentation

## 2024-05-16 DIAGNOSIS — C61 Malignant neoplasm of prostate: Secondary | ICD-10-CM

## 2024-05-16 DIAGNOSIS — C189 Malignant neoplasm of colon, unspecified: Secondary | ICD-10-CM | POA: Insufficient documentation

## 2024-05-16 DIAGNOSIS — J449 Chronic obstructive pulmonary disease, unspecified: Secondary | ICD-10-CM | POA: Insufficient documentation

## 2024-05-16 DIAGNOSIS — Z9221 Personal history of antineoplastic chemotherapy: Secondary | ICD-10-CM | POA: Diagnosis not present

## 2024-05-16 DIAGNOSIS — C186 Malignant neoplasm of descending colon: Secondary | ICD-10-CM | POA: Diagnosis present

## 2024-05-16 DIAGNOSIS — F17210 Nicotine dependence, cigarettes, uncomplicated: Secondary | ICD-10-CM | POA: Insufficient documentation

## 2024-05-16 DIAGNOSIS — Z923 Personal history of irradiation: Secondary | ICD-10-CM | POA: Insufficient documentation

## 2024-05-16 DIAGNOSIS — Z8546 Personal history of malignant neoplasm of prostate: Secondary | ICD-10-CM | POA: Insufficient documentation

## 2024-05-16 DIAGNOSIS — G620 Drug-induced polyneuropathy: Secondary | ICD-10-CM | POA: Insufficient documentation

## 2024-05-16 LAB — PSA: Prostatic Specific Antigen: 0.11 ng/mL (ref 0.00–4.00)

## 2024-05-16 NOTE — Progress Notes (Signed)
 Radiation Oncology Follow up Note  Name: Colin Griffin   Date:   05/16/2024 MRN:  [id number] DOB: Jun 08, 1951    This 73 y.o. male presents to the clinic today for 66-month follow-up status post image guided IMRT radiation therapy to his prostate for Gleason 7 adenocarcinoma patient is currently under treatment for colon cancer with chemotherapy.  REFERRING PROVIDER: Renea Carrion*  HPI: Patient is a 73 year old male now out 10 months having completed image guided IMRT radiation therapy for Gleason 7 adenocarcinoma.  He is seen today in routine follow-up is doing well.  He has not had a recent PSA I have sent him to the lab today for that.  He also is currently being treated with FOLFOX chemotherapy.  He is tolerating his chemotherapy well.  He is status post left hemicolectomy.  He specifically denies any increased lower urinary tract symptoms or bowel problems.  He was recently put on prednisone  for some pulmonary congestion from his pulmonologist Dr. Saundra Curl.  COMPLICATIONS OF TREATMENT: none  FOLLOW UP COMPLIANCE: keeps appointments   PHYSICAL EXAM:  BP 128/83   Temp (!) 97.4 F (36.3 C) (Tympanic)   Resp 20   Wt 167 lb (75.8 kg)   BMI 22.03 kg/m  Well-developed well-nourished patient in NAD. HEENT reveals PERLA, EOMI, discs not visualized.  Oral cavity is clear. No oral mucosal lesions are identified. Neck is clear without evidence of cervical or supraclavicular adenopathy. Lungs are clear to A&P. Cardiac examination is essentially unremarkable with regular rate and rhythm without murmur rub or thrill. Abdomen is benign with no organomegaly or masses noted. Motor sensory and DTR levels are equal and symmetric in the upper and lower extremities. Cranial nerves II through XII are grossly intact. Proprioception is intact. No peripheral adenopathy or edema is identified. No motor or sensory levels are noted. Crude visual fields are within normal range.  RADIOLOGY RESULTS: No  current films to review  PLAN: Present time patient is stable from a prostate standpoint.  I sent him to the lab today for PSA we will see him back in 6 months for another PSA assessment.  He continues treatment for his stage III colon cancer.  Patient knows to call with any concerns at any time.  I have asked him to forward me any imaging that he might have in the interval till his next visit.  I would like to take this opportunity to thank you for allowing me to participate in the care of your patient.Glenis Langdon, MD

## 2024-05-17 ENCOUNTER — Other Ambulatory Visit: Payer: Self-pay

## 2024-05-21 ENCOUNTER — Inpatient Hospital Stay: Admitting: Internal Medicine

## 2024-05-21 ENCOUNTER — Encounter: Payer: Self-pay | Admitting: Internal Medicine

## 2024-05-21 ENCOUNTER — Telehealth: Payer: Self-pay | Admitting: Internal Medicine

## 2024-05-21 ENCOUNTER — Inpatient Hospital Stay

## 2024-05-21 VITALS — BP 117/78 | HR 95 | Temp 98.3°F | Resp 28 | Ht 73.0 in | Wt 173.0 lb

## 2024-05-21 DIAGNOSIS — C186 Malignant neoplasm of descending colon: Secondary | ICD-10-CM

## 2024-05-21 DIAGNOSIS — Z95828 Presence of other vascular implants and grafts: Secondary | ICD-10-CM

## 2024-05-21 DIAGNOSIS — C61 Malignant neoplasm of prostate: Secondary | ICD-10-CM

## 2024-05-21 LAB — CMP (CANCER CENTER ONLY)
ALT: 18 U/L (ref 0–44)
AST: 21 U/L (ref 15–41)
Albumin: 3.9 g/dL (ref 3.5–5.0)
Alkaline Phosphatase: 53 U/L (ref 38–126)
Anion gap: 8 (ref 5–15)
BUN: 11 mg/dL (ref 8–23)
CO2: 21 mmol/L — ABNORMAL LOW (ref 22–32)
Calcium: 9 mg/dL (ref 8.9–10.3)
Chloride: 104 mmol/L (ref 98–111)
Creatinine: 1.04 mg/dL (ref 0.61–1.24)
GFR, Estimated: >60 mL/min (ref >60)
Glucose, Bld: 234 mg/dL — ABNORMAL HIGH (ref 70–99)
Potassium: 3.9 mmol/L (ref 3.5–5.1)
Sodium: 133 mmol/L — ABNORMAL LOW (ref 135–145)
Total Bilirubin: 0.6 mg/dL (ref 0.0–1.2)
Total Protein: 7.2 g/dL (ref 6.5–8.1)

## 2024-05-21 LAB — CBC WITH DIFFERENTIAL (CANCER CENTER ONLY)
Abs Immature Granulocytes: 0.06 10*3/uL (ref 0.00–0.07)
Basophils Absolute: 0.1 10*3/uL (ref 0.0–0.1)
Basophils Relative: 1 %
Eosinophils Absolute: 0.2 10*3/uL (ref 0.0–0.5)
Eosinophils Relative: 3 %
HCT: 35.9 % — ABNORMAL LOW (ref 39.0–52.0)
Hemoglobin: 12.1 g/dL — ABNORMAL LOW (ref 13.0–17.0)
Immature Granulocytes: 1 %
Lymphocytes Relative: 28 %
Lymphs Abs: 1.9 10*3/uL (ref 0.7–4.0)
MCH: 31.6 pg (ref 26.0–34.0)
MCHC: 33.7 g/dL (ref 30.0–36.0)
MCV: 93.7 fL (ref 80.0–100.0)
Monocytes Absolute: 0.7 10*3/uL (ref 0.1–1.0)
Monocytes Relative: 11 %
Neutro Abs: 3.9 10*3/uL (ref 1.7–7.7)
Neutrophils Relative %: 56 %
Platelet Count: 183 10*3/uL (ref 150–400)
RBC: 3.83 MIL/uL — ABNORMAL LOW (ref 4.22–5.81)
RDW: 12.7 % (ref 11.5–15.5)
WBC Count: 6.9 10*3/uL (ref 4.0–10.5)
nRBC: 0 % (ref 0.0–0.2)

## 2024-05-21 LAB — PSA: Prostatic Specific Antigen: 0.1 ng/mL (ref 0.00–4.00)

## 2024-05-21 MED ORDER — HEPARIN SOD (PORK) LOCK FLUSH 100 UNIT/ML IV SOLN
500.0000 [IU] | Freq: Once | INTRAVENOUS | Status: AC
  Administered 2024-05-21: 500 [IU] via INTRAVENOUS
  Filled 2024-05-21: qty 5

## 2024-05-21 MED ORDER — SODIUM CHLORIDE 0.9% FLUSH
10.0000 mL | INTRAVENOUS | Status: DC | PRN
  Administered 2024-05-21: 10 mL via INTRAVENOUS
  Filled 2024-05-21: qty 10

## 2024-05-21 NOTE — Telephone Encounter (Signed)
 Pt needs to be seen by Kasa. Please call office when/if patient calls back

## 2024-05-21 NOTE — Patient Instructions (Signed)

## 2024-05-21 NOTE — Progress Notes (Signed)
 C/o still having cough and congestion. He finished prednisone , sx coming back. PCP will not call him back x2 weeks. SOB is a little better, sees Dr. Francis. Asking for another round of antibiotic and prednisone .  C/o pulse being elevated. At bedtime 115 at times 126. Noticed x4 days.

## 2024-05-21 NOTE — Assessment & Plan Note (Addendum)
#   OCT 2024-Colon cancer, status post a partial left colectomy 09/04/2023, stage IIIa (pT2 pN1b)-negative resection margins, lymphovascular invasion present, 3/11 lymph nodes, 2 tumor deposits. [Drs.Sherrill; White;Masarouty]. mismatch repair protein expression intact. FOLFOX chemotherapy is given every 2 weeks. Awaiting colonoscopy < 1 year.  #  s/p  FOLFOX # 11 of planned 12-discontinued because of poor tolerance/COPD exacerbation. Last FOLFOX on 03/18/2024.   # JUNE 2025-  Signatera- negative-   ordered baseline CT scan today/ in 2 months.   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  Stable.  # COPD/ albuterol  prn; s/p steroids [Dr.Kasa] not well controlled-discussed with Dr.Kasa-informed patient to call pulmonary office for further evaluation.  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 Stable.   # Diabetes-FBG 233- Trulicity /metformin - CGM-monitor closely on chemotherapy/steroids- stable  # Acitive smoker: Recommend quitting smoking- currently 2 cig/day- .  Continue lung cancer screening-   # IV access port: # port/IV access-  discussed re: pro and cons of keeping the port vs. Explantation. Pt in agreement for port explantation-  Referral to IR re: mediport explantation.   # DISPOSITION:  # Referral to IR re: mediport explantation. # follow up in 2 months MD; port/labs-- cbc/cmp; CEA; CT AP-DRI  Dr.B

## 2024-05-21 NOTE — Progress Notes (Signed)
 [facility] CONSULT NOTE  Patient Care Team: Sharma Coyer, MD as PCP - General (Family Medicine) Maurie Rayfield BIRCH, RN as Oncology Nurse Navigator Rennie Cindy SAUNDERS, MD as Consulting Physician (Oncology) Isaiah Scrivener, MD as Consulting Physician (Pulmonary Disease)  CHIEF COMPLAINTS/PURPOSE OF CONSULTATION: Colon cancer  Oncology History Overview Note     Colon cancer, status post a partial left colectomy 09/04/2023, stage IIIa (pT2 pN1b) CT abdomen/pelvis 03/01/2023-prostate/pelvic abscess, large amount of colonic stool CT chest 03/30/2023-bilateral solid pulmonary nodules-likely benign, 24-month follow-up CT recommended Colonoscopy 04/12/2023-multiple polyps removed, greater than 50 mm polyp in the distal ascending colon and sigmoid colon not removed Colonoscopy 06/29/2023 greater than 50 mm polyps removed from the ascending, transverse, and descending colon-tubular adenomas, sigmoid lesion at 35 cm incompletely resected with biopsy revealing an ulcerated tubular adenoma with at least high-grade dysplasia and suspicious for invasive or [place] Descending colon resection 09/04/2023, pT2, PN 1B moderately differentiated adenocarcinoma, negative resection margins, lymphovascular invasion present, 3/11 lymph nodes, 2 tumor deposits, mismatch repair protein expression intact, MSS  Multiple colon polyps on colonoscopy 04/12/2023 and 06/29/2023-tubular adenomas Prostate cancer-Gleason 7 acinar adenocarcinoma involving 5-10% of submitted tissue from a TUR specimen 03/03/2023 Prostate 03/20/2023-mild prostamegaly- PI-RADS category 5 lesion in the left peripheral zone and category 4 lesion in the left anterior peripheral zone and left anterior fibromuscular stroma, reduced size of prostate abscess with continued surrounding prostatitis and periprostatic stranding IMRT 20 6/24 - 07/20/2023  4.  Diabetes 5.  History of colon cancer-negative CancerNext expanded panel 08/29/2023 6.   History of lung nodules, ongoing tobacco use, followed in the lung cancer screening clinic# prostate/pelvic abscess.  He was admitted for antibiotics and drainage of the pelvic abscess 03/01/2023. A TUR and was found to have a Gleason 7 prostate cancer involving 5-10% of the submitted tissue.  He was referred to Dr. Lenn and completed external beam radiation to the prostate.  # MAY 2024 [screening colo]- A greater than 50 mm polyp was removed from the proximal ascending colon.  Additional greater than 50 m polyps were not removed from the distal ascending and sigmoid colon.  Additional smaller polyps were removed from the colon. The pathology revealed multiple fragments of tubular adenomas. Dr. Marijean than 50 mm polyps were removed from the ascending, transverse, and descending colon.  A polypoid lesion at 35 cm was incompletely resected and tattooed.  The pathology from multiple polyps returned as tubular adenomas without high-grade dysplasia or carcinoma.  # OCTOBER 2024- STAGE III SIGMOID COLON CANCER [Dr.Sherril/Dr.White- [place]] pathology from the sigmoid resection returned as invasive moderately differentiated adenocarcinoma involving the descending colon.  Carcinoma invades into but not through the muscularis propria.  The resection margins are negative.  Lymphovascular invasion is present.  Metastatic carcinoma was identified in 3 of 11 lymph nodes and there were 2 tumor deposits.  Mismatch repair protein expression is intact.   # Multiple colon polyps on colonoscopy 04/12/2023 and 06/29/2023-tubular adenomas- s/p  Genetics counseling: negative CancerNext expanded panel 08/29/2023  # NOV 4th, 2024- FOLFOX q 2 w x 6 months- # OCT 2024-Colon cancer, status post a partial left colectomy 09/04/2023, stage IIIa (pT2 pN1b)-negative resection margins, lymphovascular invasion present, 3/11 lymph nodes, 2 tumor deposits. [Drs.Sherrill; White;Masarouty]. mismatch repair protein expression intact.  FOLFOX chemotherapy is given every 2 weeks. Awaiting colonoscopy < 1 year.  #  s/p  FOLFOX # 11 of planned 12-discontinued because of poor tolerance/COPD exacerbation. Last FOLFOX on 03/18/2024.    # APRIL 2024-  Prostate cancer [s/p EBRT- Dr.Chrystal]-    Cancer of descending colon (HCC)  09/19/2023 Initial Diagnosis   Cancer of descending colon (HCC)   09/19/2023 Cancer Staging   Staging form: Colon and Rectum, AJCC 8th Edition - Pathologic: pT2, cM0 - Signed by Cloretta Arley NOVAK, MD on 09/19/2023 Histologic grading system: 4 grade system Histologic grade (G): G2 Residual tumor (R): R0 - None Laterality: Left Lymph-vascular invasion (LVI): LVI present/identified, NOS Tumor deposits (TD): Present Perineural invasion (PNI): Absent Microsatellite instability (MSI): Stable   09/19/2023 Cancer Staging   Staging form: Colon and Rectum, AJCC 8th Edition - Pathologic: Stage IIIA (pT2, pN1b, cM0) - Signed by Cloretta Arley NOVAK, MD on 09/19/2023   10/11/2023 -  Chemotherapy   Patient is on Treatment Plan : COLORECTAL FOLFOX q14d x 6 months      HISTORY OF PRESENTING ILLNESS: Patient ambulating-independently.  Alone.  Colin Griffin 73 y.o.  male pleasant patient with  stage III colon cancer s/p left hemicolectomy currently s/p adjuvant chemotherapy with FOLFOX; COPD- active smoker is here for a follow up.  C/o still having cough and congestion. He recently finished prednisone  and antibiotics. However, noted to have worsening symptoms.   Patient finished chemotherapy approximately 1 month ago. # 12 cycle discontinued sec to COPD exacerbations.   Mild tingling and numbness of the chemotherapy in his hands- slightly worse.   Denies any blood in stools or black-colored stools.  No nausea no vomiting.   Review of Systems  Constitutional:  Negative for chills, diaphoresis, fever, malaise/fatigue and weight loss.  HENT:  Negative for nosebleeds and sore throat.   Eyes:  Negative for double  vision.  Respiratory:  Positive for cough and shortness of breath. Negative for hemoptysis, sputum production and wheezing.   Cardiovascular:  Negative for chest pain, palpitations, orthopnea and leg swelling.  Gastrointestinal:  Positive for nausea. Negative for abdominal pain, blood in stool, constipation, diarrhea, heartburn, melena and vomiting.  Genitourinary:  Negative for dysuria, frequency and urgency.  Musculoskeletal:  Negative for back pain and joint pain.  Skin: Negative.  Negative for itching and rash.  Neurological:  Negative for dizziness, tingling, focal weakness, weakness and headaches.  Endo/Heme/Allergies:  Does not bruise/bleed easily.  Psychiatric/Behavioral:  Negative for depression. The patient is not nervous/anxious and does not have insomnia.     MEDICAL HISTORY:  Past Medical History:  Diagnosis Date   Cancer [facility]) April 2024   Constipation 03/04/2023   Diabetes mellitus without complication [facility]) March 2024    SURGICAL HISTORY: Past Surgical History:  Procedure Laterality Date   APPENDECTOMY     BIOPSY  06/29/2023   Procedure: BIOPSY;  Surgeon: Wilhelmenia Aloha Raddle., MD;  Location: [facility];  Service: Gastroenterology;;   COLONOSCOPY WITH PROPOFOL  N/A 04/12/2023   Procedure: COLONOSCOPY WITH PROPOFOL ;  Surgeon: Unk Corinn Skiff, MD;  Location: [facility];  Service: Gastroenterology;  Laterality: N/A;   COLONOSCOPY WITH PROPOFOL  N/A 06/29/2023   Procedure: COLONOSCOPY WITH PROPOFOL ;  Surgeon: Wilhelmenia Aloha Raddle., MD;  Location: [facility];  Service: Gastroenterology;  Laterality: N/A;   ENDOSCOPIC MUCOSAL RESECTION N/A 06/29/2023   Procedure: ENDOSCOPIC MUCOSAL RESECTION;  Surgeon: Wilhelmenia Aloha Raddle., MD;  Location: [facility];  Service: Gastroenterology;  Laterality: N/A;   FLEXIBLE SIGMOIDOSCOPY N/A 09/04/2023   Procedure: FLEXIBLE SIGMOIDOSCOPY;  Surgeon: Teresa Lonni HERO, MD;  Location: [facility];  Service: General;  Laterality: N/A;    HEMOSTASIS CONTROL  06/29/2023   Procedure: HEMOSTASIS CONTROL;  Surgeon: Wilhelmenia Aloha Raddle.,  MD;  Location: [facility];  Service: Gastroenterology;;   IR IMAGING GUIDED PORT INSERTION  09/26/2023   IR RADIOLOGIST EVAL & MGMT  03/16/2023   PELVIC ABCESS DRAINAGE     March 2024   POLYPECTOMY  06/29/2023   Procedure: POLYPECTOMY;  Surgeon: Mansouraty, Aloha Raddle., MD;  Location: [facility];  Service: Gastroenterology;;   ROBLEY LIFTING INJECTION  06/29/2023   Procedure: SUBMUCOSAL LIFTING INJECTION;  Surgeon: Wilhelmenia Aloha Raddle., MD;  Location: [facility];  Service: Gastroenterology;;   SUBMUCOSAL TATTOO INJECTION  06/29/2023   Procedure: SUBMUCOSAL TATTOO INJECTION;  Surgeon: Wilhelmenia Aloha Raddle., MD;  Location: [facility];  Service: Gastroenterology;;   TRANSURETHRAL RESECTION OF PROSTATE N/A 03/03/2023   Procedure: TRANSURETHRAL RESECTION OF THE PROSTATE (TURP)/ UNROOFING OF PROSTATE ABSCESS;  Surgeon: Twylla Glendia BROCKS, MD;  Location: [facility];  Service: Urology;  Laterality: N/A;    SOCIAL HISTORY: Social History   Socioeconomic History   Marital status: Married    Spouse name: Editor, commissioning   Number of children: 1   Years of education: Not on file   Highest education level: Master's degree (e.g., MA, MS, MEng, MEd, MSW, MBA)  Occupational History   Not on file  Tobacco Use   Smoking status: Every Day    Current packs/day: 0.50    Average packs/day: 1.5 packs/day for 54.5 years (81.5 ttl pk-yrs)    Types: Cigarettes    Start date: 34   Smokeless tobacco: Never   Tobacco comments:    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        Has cut down to half a pack and smoking half of the cigarette a day 04/17/24  Vaping Use   Vaping status: Some Days   Start date: 08/08/2022   Substances: Flavoring  Substance and Sexual Activity   Alcohol use: Not Currently   Drug use: Never   Sexual activity: Not Currently    Birth  control/protection: None  Other Topics Concern   Not on file  Social History Narrative   ** Merged History Encounter **       Retired from Holiday representative for 30 years    Social Drivers of Corporate investment banker Strain: Low Risk  (05/01/2023)   Overall Financial Resource Strain (CARDIA)    Difficulty of Paying Living Expenses: Not hard at all  Food Insecurity: No Food Insecurity (09/19/2023)   Hunger Vital Sign    Worried About Running Out of Food in the Last Year: Never true    Ran Out of Food in the Last Year: Never true  Transportation Needs: No Transportation Needs (09/19/2023)   PRAPARE - Administrator, Civil Service (Medical): No    Lack of Transportation (Non-Medical): No  Physical Activity: Sufficiently Active (05/01/2023)   Exercise Vital Sign    Days of Exercise per Week: 6 days    Minutes of Exercise per Session: 150+ min  Stress: No Stress Concern Present (05/01/2023)   [employer] of Occupational Health - Occupational Stress Questionnaire    Feeling of Stress : Not at all  Social Connections: Unknown (05/01/2023)   Social Connection and Isolation Panel    Frequency of Communication with Friends and Family: Three times a week    Frequency of Social Gatherings with Friends and Family: Twice a week    Attends Religious Services: Not on Marketing executive or Organizations: No  Attends Banker Meetings: Patient declined    Marital Status: Married  Catering manager Violence: Not At Risk (09/19/2023)   Humiliation, Afraid, Rape, and Kick questionnaire    Fear of Current or Ex-Partner: No    Emotionally Abused: No    Physically Abused: No    Sexually Abused: No    FAMILY HISTORY: Family History  Problem Relation Age of Onset   Atrial fibrillation Mother    Skin cancer Father        non-melanoma   Colon cancer Sister 81       Lynch Syndrome   Breast cancer Sister 45   Diabetes Brother    Throat cancer Maternal  Uncle    Breast cancer Paternal Aunt 39 - 36   Colon cancer Maternal Grandmother 12   Alcohol abuse Maternal Grandfather    Breast cancer Paternal Grandmother 102   Prostate cancer Paternal Grandfather 26 - 37   Other Niece        2 nieces have Lynch Syndrome (brother's daughters)   Diabetes Other    Ovarian cancer Other 18       paternal first cousin once removed   Colon polyps Neg Hx    Stomach cancer Neg Hx    Esophageal cancer Neg Hx    Inflammatory bowel disease Neg Hx    Liver disease Neg Hx    Pancreatic cancer Neg Hx     ALLERGIES:  has no known allergies.  MEDICATIONS:  Current Outpatient Medications  Medication Sig Dispense Refill   albuterol  (VENTOLIN  HFA) 108 (90 Base) MCG/ACT inhaler Inhale 2 puffs into the lungs every 6 (six) hours as needed for wheezing or shortness of breath. 8 g 2   Continuous Glucose Sensor (FREESTYLE LIBRE 3 SENSOR) MISC PLACE 1 SENSOR ON THE SKIN EVERY 14 DAYS. USE TO CHECK GLUCOSE CONTINUOUSLY 2 each 6   Dulaglutide  (TRULICITY ) 0.75 MG/0.5ML SOAJ INJECT 0.75 MG SUBCUTANEOUSLY ONE TIME PER WEEK 6 mL 3   Fluticasone-Umeclidin-Vilant (TRELEGY ELLIPTA ) 200-62.5-25 MCG/ACT AEPB Inhale 1 Act into the lungs daily. 1 each 6   Fluticasone-Umeclidin-Vilant (TRELEGY ELLIPTA ) 200-62.5-25 MCG/ACT AEPB Inhale 1 Inhalation into the lungs daily in the afternoon. 1 each    Fluticasone-Umeclidin-Vilant (TRELEGY ELLIPTA ) 200-62.5-25 MCG/ACT AEPB Inhale 1 puff into the lungs daily.     lidocaine -prilocaine  (EMLA ) cream Apply 1 Application topically as needed. 30 g 1   metFORMIN  (GLUCOPHAGE -XR) 500 MG 24 hr tablet TAKE 1 TABLET BY MOUTH EVERY DAY WITH BREAKFAST (Patient taking differently: 500 mg 2 (two) times daily with a meal.) 90 tablet 1   Na Sulfate-K Sulfate-Mg Sulfate concentrate (SUPREP BOWEL PREP KIT) 17.5-3.13-1.6 GM/177ML SOLN Take 1 kit (354 mLs total) by mouth as directed. For colonoscopy prep 354 mL 0   ferrous sulfate  325 (65 FE) MG tablet Take 1  tablet (325 mg total) by mouth 2 (two) times daily with a meal. (Patient not taking: Reported on 05/21/2024) 60 tablet 0   nicotine  (NICODERM CQ  - DOSED IN MG/24 HOURS) 21 mg/24hr patch Place 1 patch (21 mg total) onto the skin daily. (Patient not taking: Reported on 05/21/2024) 30 patch 1   ondansetron  (ZOFRAN ) 8 MG tablet One pill every 8 hours as needed for nausea/vomitting. (Patient not taking: Reported on 05/21/2024) 40 tablet 1   predniSONE  (DELTASONE ) 20 MG tablet Take 1 tablet (20 mg total) by mouth daily with breakfast. 10 days (Patient not taking: Reported on 05/21/2024) 10 tablet 1   No current facility-administered medications for this  visit.    PHYSICAL EXAMINATION:   Vitals:   05/21/24 1258  BP: 117/78  Pulse: 95  Resp: (!) 28  Temp: 98.3 F (36.8 C)  SpO2: 96%   Filed Weights   05/21/24 1258  Weight: 173 lb (78.5 kg)    Physical Exam Vitals and nursing note reviewed.  HENT:     Head: Normocephalic and atraumatic.     Mouth/Throat:     Pharynx: Oropharynx is clear.   Eyes:     Extraocular Movements: Extraocular movements intact.     Pupils: Pupils are equal, round, and reactive to light.    Cardiovascular:     Rate and Rhythm: Normal rate and regular rhythm.  Pulmonary:     Comments: Decreased breath sounds bilaterally.  Abdominal:     Palpations: Abdomen is soft.   Musculoskeletal:        General: Normal range of motion.     Cervical back: Normal range of motion.   Skin:    General: Skin is warm.   Neurological:     General: No focal deficit present.     Mental Status: He is alert and oriented to person, place, and time.   Psychiatric:        Behavior: Behavior normal.        Judgment: Judgment normal.     LABORATORY DATA:  I have reviewed the data as listed Lab Results  Component Value Date   WBC 6.9 05/21/2024   HGB 12.1 (L) 05/21/2024   HCT 35.9 (L) 05/21/2024   MCV 93.7 05/21/2024   PLT 183 05/21/2024   Recent Labs     04/03/24 1312 04/09/24 1059 05/21/24 1241  NA 133* 133* 133*  K 3.5 3.7 3.9  CL 102 102 104  CO2 21* 22 21*  GLUCOSE 171* 209* 234*  BUN 22 29* 11  CREATININE 1.07 0.97 1.04  CALCIUM  8.5* 8.8* 9.0  GFRNONAA >60 >60 >60  PROT 7.2 7.5 7.2  ALBUMIN 3.1* 3.3* 3.9  AST 13* 16 21  ALT 17 21 18   ALKPHOS 100 97 53  BILITOT 0.2 0.6 0.6    RADIOGRAPHIC STUDIES: I have personally reviewed the radiological images as listed and agreed with the findings in the report. No results found.   Cancer of descending colon (HCC) # OCT 2024-Colon cancer, status post a partial left colectomy 09/04/2023, stage IIIa (pT2 pN1b)-negative resection margins, lymphovascular invasion present, 3/11 lymph nodes, 2 tumor deposits. [Drs.Sherrill; White;Masarouty]. mismatch repair protein expression intact. FOLFOX chemotherapy is given every 2 weeks. Awaiting colonoscopy < 1 year.  #  s/p  FOLFOX # 11 of planned 12-discontinued because of poor tolerance/COPD exacerbation. Last FOLFOX on 03/18/2024.   # JUNE 2025-  Signatera- negative-   ordered baseline CT scan today/ in 2 months.   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  Stable.  # COPD/ albuterol  prn; s/p steroids [Dr.Kasa] not well controlled-discussed with Dr.Kasa-informed patient to call pulmonary office for further evaluation.  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 Stable.   # Diabetes-FBG 233- Trulicity /metformin - CGM-monitor closely on chemotherapy/steroids- stable  # Acitive smoker: Recommend quitting smoking- currently 2 cig/day- .  Continue lung cancer screening-   # IV access port: # port/IV access-  discussed re: pro and cons of keeping the port vs. Explantation. Pt in agreement for port explantation-  Referral to IR  re: mediport explantation.   # DISPOSITION:  # Referral to IR re: mediport  explantation. # follow up in 2 months MD; port/labs-- cbc/cmp; CEA; CT [facility]  Dr.B   Above plan of care was discussed with patient/family in detail.  My contact information was given to the patient/family.     Cindy JONELLE Joe, MD 05/21/2024 4:15 PM

## 2024-05-22 LAB — CEA: CEA: 8.5 ng/mL — ABNORMAL HIGH (ref 0.0–4.7)

## 2024-05-24 ENCOUNTER — Encounter: Payer: Self-pay | Admitting: Internal Medicine

## 2024-05-24 ENCOUNTER — Telehealth: Payer: Self-pay | Admitting: Internal Medicine

## 2024-05-24 ENCOUNTER — Telehealth: Payer: Self-pay | Admitting: *Deleted

## 2024-05-24 NOTE — Telephone Encounter (Signed)
 Called pt to schedule port removal. Patient states he is very stressed out regarding recent CEA result of 8.5. Pt thinks we need to schedule a PET scan asap. We decided to put port removal on hold.I told him I would have to speak with Dr B regarding next steps.

## 2024-05-24 NOTE — Telephone Encounter (Signed)
 Called patient-regarding his concerns for elevated CEA.  Clinically less likely malignancy-given signetera was negative; and patient smoker/infections  Please move the scan up to next week or so- will call with results.   Hold off port explantation at this time.  Keep appointments as planned-

## 2024-05-27 ENCOUNTER — Telehealth: Payer: Self-pay

## 2024-05-27 NOTE — Telephone Encounter (Signed)
 Morning received call from [facility] that authorization is complete for [facility]) Auth # [id number] for scan tomorrow  but its incorrect and we need it for [facility]. It has to be done by 1p today. Consuelo is contact person [phone]. Schedulers notified

## 2024-05-28 ENCOUNTER — Inpatient Hospital Stay
Admission: RE | Admit: 2024-05-28 | Discharge: 2024-05-28 | Disposition: A | Discharge status: Home / Self Care | Source: Ambulatory Visit | Attending: Internal Medicine

## 2024-05-28 DIAGNOSIS — C186 Malignant neoplasm of descending colon: Secondary | ICD-10-CM

## 2024-05-28 MED ORDER — HEPARIN SOD (PORK) LOCK FLUSH 100 UNIT/ML IV SOLN: 500.0000 [IU] | Freq: Once | INTRAVENOUS | Status: DC

## 2024-05-28 MED ORDER — SODIUM CHLORIDE 0.9% FLUSH
10.0000 mL | INTRAVENOUS | Status: DC | PRN
Start: 2024-05-28 — End: 2024-05-29

## 2024-05-28 MED ORDER — IOPAMIDOL (ISOVUE-300) INJECTION 61%
100.0000 mL | Freq: Once | INTRAVENOUS | Status: AC | PRN
Start: 2024-05-28 — End: 2024-05-28
  Administered 2024-05-28: 100 mL via INTRAVENOUS

## 2024-05-29 ENCOUNTER — Ambulatory Visit: Admitting: Internal Medicine

## 2024-05-29 ENCOUNTER — Encounter: Payer: Self-pay | Admitting: Internal Medicine

## 2024-05-29 ENCOUNTER — Ambulatory Visit: Payer: Medicare PPO

## 2024-05-29 VITALS — BP 120/80 | HR 87 | Temp 97.8°F | Ht 73.0 in | Wt 167.2 lb

## 2024-05-29 DIAGNOSIS — F17210 Nicotine dependence, cigarettes, uncomplicated: Secondary | ICD-10-CM

## 2024-05-29 DIAGNOSIS — R918 Other nonspecific abnormal finding of lung field: Secondary | ICD-10-CM | POA: Diagnosis not present

## 2024-05-29 DIAGNOSIS — J449 Chronic obstructive pulmonary disease, unspecified: Secondary | ICD-10-CM | POA: Diagnosis not present

## 2024-05-29 DIAGNOSIS — R053 Chronic cough: Secondary | ICD-10-CM

## 2024-05-29 DIAGNOSIS — J479 Bronchiectasis, uncomplicated: Secondary | ICD-10-CM

## 2024-05-29 DIAGNOSIS — G4734 Idiopathic sleep related nonobstructive alveolar hypoventilation: Secondary | ICD-10-CM

## 2024-05-29 DIAGNOSIS — J849 Interstitial pulmonary disease, unspecified: Secondary | ICD-10-CM | POA: Diagnosis not present

## 2024-05-29 DIAGNOSIS — Z0000 Encounter for general adult medical examination without abnormal findings: Secondary | ICD-10-CM

## 2024-05-29 DIAGNOSIS — H579 Unspecified disorder of eye and adnexa: Secondary | ICD-10-CM

## 2024-05-29 MED ORDER — FLUTICASONE PROPIONATE 50 MCG/ACT NA SUSP: 2.0000 | Freq: Every day | NASAL | 10 refills | Status: AC

## 2024-05-29 MED ORDER — ROFLUMILAST 500 MCG PO TABS: 500.0000 ug | ORAL_TABLET | Freq: Every day | ORAL | 10 refills | Status: AC

## 2024-05-29 NOTE — Patient Instructions (Signed)
 Mr. Colin Griffin , Thank you for taking time out of your busy schedule to complete your Annual Wellness Visit with me. I enjoyed our conversation and look forward to speaking with you again next year. I, as well as your care team,  appreciate your ongoing commitment to your health goals. Please review the following plan we discussed and let me know if I can assist you in the future.   Follow up Visits: Next Medicare AWV with our clinical staff:   06/03/25 @ 3:10 PM BY PHONE Have you seen your provider in the last 6 months (3 months if uncontrolled diabetes)? Yes   Clinician Recommendations:  Aim for 30 minutes of exercise or brisk walking, 6-8 glasses of water, and 5 servings of fruits and vegetables each day. TAKE CARE!       This is a list of the screening recommended for you and due dates:  Health Maintenance  Topic Date Due   Eye exam for diabetics  Never done   Hepatitis C Screening  Never done   DTaP/Tdap/Td vaccine (1 - Tdap) Never done   Pneumococcal Vaccine for age over 62 (1 of 2 - PCV) Never done   Zoster (Shingles) Vaccine (1 of 2) Never done   COVID-19 Vaccine (3 - Pfizer risk series) 03/02/2020   Hemoglobin A1C  02/27/2024   Yearly kidney health urinalysis for diabetes  05/14/2024   Complete foot exam   05/14/2024   Colon Cancer Screening  06/28/2024   Flu Shot  06/28/2024   Screening for Lung Cancer  10/08/2024   Yearly kidney function blood test for diabetes  05/21/2025   Medicare Annual Wellness Visit  05/29/2025   Hepatitis B Vaccine  Aged Out   HPV Vaccine  Aged Out   Meningitis B Vaccine  Aged Out    Advanced directives: (ACP Link)Information on Advanced Care Planning can be found at   Secretary of [employer] Advance Health Care Directives Advance Health Care Directives. [email or web address]  Advance Care Planning is important because it:  [x]  Makes sure you receive the medical care that is consistent with your values, goals, and preferences  [x]  It provides guidance to  your family and loved ones and reduces their decisional burden about whether or not they are making the right decisions based on your wishes.  Follow the link provided in your after visit summary or read over the paperwork we have mailed to you to help you started getting your Advance Directives in place. If you need assistance in completing these, please reach out to us  so that we can help you!

## 2024-05-29 NOTE — Progress Notes (Addendum)
 [facility] [place] Pulmonary Medicine Consultation      Date: 05/29/2024,   MRN# [id number] Colin Griffin May 31, 1951    CHIEF COMPLAINT:   Assessment of COPD CHRONIC BRONCHITIS WITH BRONCHIECTASIS   HISTORY OF PRESENT ILLNESS   73 year old pleasant white male seen today for ongoing symptoms of shortness of breath increased cough and wheezing Continues to smoke, patient with severe obstructive airways disease FEV1 48% predicted  Patient has increased work of breathing and shortness of breath with exertion Also associated with productive cough with wheezing This has been going on for last 6 months Patient with chronic bronchitis chronic productive cough   Patient has history of prostate cancer Patient has a history of colon cancer  CT of the chest reviewed  Patient is enrolled in lung cancer screening program Patient does have interstitial lung disease as well as areas of emphysema Patient also has areas of bronchiectasis bilaterally  Patient has had daily productive cough greater than 6 months plus. 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 involving 5-10% of submitted tissue from a TUR specimen 03/03/2023  History of Lung Nodules CME     Pulmonary function testing June 2025 Findings relayed to patient in detail FEV1 FVC ratio was 69% predicted FEV1 48% predicted FVC is 51%  predicted Total lung capacity is 63% predicted RV/TLC ratio is 123% predicted DLCO abnormal Findings are concerning for severe obstructive restrictive and diffusion abnormalities   PAST MEDICAL HISTORY   Past Medical History:  Diagnosis Date   Cancer [facility]) April 2024   Constipation 03/04/2023   Diabetes mellitus without complication [facility]) March 2024     SURGICAL HISTORY   Past Surgical History:  Procedure Laterality Date   APPENDECTOMY     BIOPSY  06/29/2023   Procedure: BIOPSY;  Surgeon: Wilhelmenia Aloha Raddle., MD;  Location: [facility];  Service: Gastroenterology;;   COLONOSCOPY WITH PROPOFOL  N/A 04/12/2023   Procedure: COLONOSCOPY WITH PROPOFOL ;  Surgeon: Unk Corinn Skiff, MD;  Location: [facility];  Service: Gastroenterology;  Laterality: N/A;   COLONOSCOPY WITH PROPOFOL  N/A 06/29/2023   Procedure: COLONOSCOPY WITH PROPOFOL ;  Surgeon: Wilhelmenia Aloha Raddle., MD;  Location: [facility];  Service: Gastroenterology;  Laterality: N/A;   ENDOSCOPIC MUCOSAL RESECTION N/A 06/29/2023   Procedure: ENDOSCOPIC MUCOSAL RESECTION;  Surgeon: Wilhelmenia Aloha Raddle., MD;  Location: [facility];  Service: Gastroenterology;  Laterality: N/A;   FLEXIBLE SIGMOIDOSCOPY N/A 09/04/2023   Procedure: FLEXIBLE SIGMOIDOSCOPY;  Surgeon: Teresa Lonni HERO, MD;  Location: [facility];  Service: General;  Laterality: N/A;   HEMOSTASIS CONTROL  06/29/2023   Procedure: HEMOSTASIS CONTROL;  Surgeon: Wilhelmenia Aloha Raddle., MD;  Location: [facility];  Service: Gastroenterology;;   IR IMAGING GUIDED PORT INSERTION  09/26/2023   IR RADIOLOGIST EVAL & MGMT  03/16/2023   PELVIC ABCESS DRAINAGE     March 2024   POLYPECTOMY  06/29/2023   Procedure: POLYPECTOMY;  Surgeon: Wilhelmenia Aloha Raddle., MD;  Location: [facility];  Service: Gastroenterology;;   ROBLEY LIFTING INJECTION  06/29/2023   Procedure: SUBMUCOSAL LIFTING INJECTION;  Surgeon: Wilhelmenia Aloha Raddle., MD;  Location: [facility];  Service:  Gastroenterology;;   SUBMUCOSAL TATTOO INJECTION  06/29/2023   Procedure: SUBMUCOSAL TATTOO INJECTION;  Surgeon: Wilhelmenia Aloha Raddle., MD;  Location: [facility];  Service: Gastroenterology;;   TRANSURETHRAL RESECTION OF PROSTATE N/A 03/03/2023   Procedure: TRANSURETHRAL RESECTION OF THE PROSTATE (TURP)/ UNROOFING OF PROSTATE ABSCESS;  Surgeon: Twylla Glendia BROCKS, MD;  Location: [facility];  Service: Urology;  Laterality: N/A;     FAMILY HISTORY   Family History  Problem Relation Age of Onset   Atrial fibrillation Mother    Skin cancer Father        non-melanoma   Colon cancer Sister 47       Lynch Syndrome   Breast cancer Sister 5   Diabetes Brother    Throat cancer Maternal Uncle    Breast cancer Paternal Aunt 75 - 36   Colon cancer Maternal Grandmother 49   Alcohol abuse Maternal Grandfather    Breast cancer Paternal Grandmother 66   Prostate cancer Paternal Grandfather 39 - 73   Other Niece        2 nieces have Lynch Syndrome (brother's daughters)   Diabetes Other    Ovarian cancer Other 30       paternal first cousin once removed   Colon polyps Neg Hx    Stomach cancer Neg Hx    Esophageal cancer Neg Hx    Inflammatory bowel disease Neg Hx    Liver disease Neg Hx    Pancreatic cancer Neg Hx      SOCIAL HISTORY   Social History   Tobacco Use   Smoking status: Every Day    Current packs/day: 0.50    Average packs/day: 1.5 packs/day for 54.5 years (81.5 ttl pk-yrs)    Types: Cigarettes    Start date: 38   Smokeless tobacco: Never   Tobacco comments:    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        Has cut down to half a pack and smoking half of the cigarette a day 04/17/24  Vaping Use   Vaping status: Some Days   Start date: 08/08/2022   Substances: Flavoring  Substance Use Topics   Alcohol use: Not Currently   Drug use: Never     MEDICATIONS    Home Medication:  Current Outpatient Rx   Order #: [id number] Class:  Normal   Order #: [id number] Class: Normal   Order #: [id number] Class: Normal   Order #: [id number] Class: Normal   Order #: [id number] Class: Normal   Order #: [id number] Class: Sample   Order #: [id number] Class: Sample   Order #: [id number] Class: Normal   Order #: [id number] Class: Normal   Order #: [id number] Class: Normal   Order #: [id number] Class: Normal   Order #: [id number] Class: Normal   Order #: [id number] Class: Normal    Current Medication:  Current Outpatient Medications:    albuterol  (VENTOLIN  HFA) 108 (90 Base) MCG/ACT inhaler, Inhale 2 puffs into the lungs every 6 (six) hours as needed for wheezing or shortness of breath., Disp: 8 g, Rfl: 2   Continuous Glucose Sensor (FREESTYLE LIBRE 3 SENSOR) MISC, PLACE 1 SENSOR ON THE SKIN EVERY 14 DAYS. USE TO CHECK GLUCOSE CONTINUOUSLY, Disp: 2 each, Rfl: 6   Dulaglutide  (TRULICITY ) 0.75 MG/0.5ML SOAJ, INJECT 0.75 MG SUBCUTANEOUSLY ONE TIME PER WEEK, Disp: 6 mL, Rfl: 3  ferrous sulfate  325 (65 FE) MG tablet, Take 1 tablet (325 mg total) by mouth 2 (two) times daily with a meal. (Patient not taking: Reported on 05/29/2024), Disp: 60 tablet, Rfl: 0   Fluticasone -Umeclidin-Vilant (TRELEGY ELLIPTA ) 200-62.5-25 MCG/ACT AEPB, Inhale 1 Act into the lungs daily., Disp: 1 each, Rfl: 6   Fluticasone -Umeclidin-Vilant (TRELEGY ELLIPTA ) 200-62.5-25 MCG/ACT AEPB, Inhale 1 Inhalation into the lungs daily in the afternoon., Disp: 1 each, Rfl:    Fluticasone -Umeclidin-Vilant (TRELEGY ELLIPTA ) 200-62.5-25 MCG/ACT AEPB, Inhale 1 puff into the lungs daily., Disp: , Rfl:    lidocaine -prilocaine  (EMLA ) cream, Apply 1 Application topically as needed., Disp: 30 g, Rfl: 1   metFORMIN  (GLUCOPHAGE -XR) 500 MG 24 hr tablet, TAKE 1 TABLET BY MOUTH EVERY DAY WITH BREAKFAST, Disp: 90 tablet, Rfl: 1   Na Sulfate-K Sulfate-Mg Sulfate concentrate (SUPREP BOWEL PREP KIT) 17.5-3.13-1.6 GM/177ML SOLN, Take 1 kit (354 mLs total) by mouth as directed. For colonoscopy prep, Disp: 354 mL, Rfl:  0   nicotine  (NICODERM CQ  - DOSED IN MG/24 HOURS) 21 mg/24hr patch, Place 1 patch (21 mg total) onto the skin daily. (Patient not taking: Reported on 05/29/2024), Disp: 30 patch, Rfl: 1   ondansetron  (ZOFRAN ) 8 MG tablet, One pill every 8 hours as needed for nausea/vomitting. (Patient not taking: Reported on 05/29/2024), Disp: 40 tablet, Rfl: 1   predniSONE  (DELTASONE ) 20 MG tablet, Take 1 tablet (20 mg total) by mouth daily with breakfast. 10 days (Patient not taking: Reported on 05/29/2024), Disp: 10 tablet, Rfl: 1  BP 120/80 (BP Location: Right Arm, Patient Position: Sitting, Cuff Size: Normal)   Pulse 87   Temp 97.8 F (36.6 C) (Oral)   Ht 6' 1 (1.854 m)   Wt 167 lb 3.2 oz (75.8 kg)   SpO2 96%   BMI 22.06 kg/m     Review of Systems: Gen:  Denies  fever, sweats, chills weight loss  HEENT: Denies blurred vision, double vision, ear pain, eye pain, hearing loss, nose bleeds, sore throat Cardiac:  No dizziness, chest pain or heaviness, chest tightness,edema, No JVD Resp:  + cough, -+sputum production, +shortness of breath,+wheezing, -hemoptysis,  Other:  All other systems negative   Physical Examination:   General Appearance: No distress  EYES PERRLA, EOM intact.   NECK Supple, No JVD Pulmonary: normal breath sounds, +rhonchi CardiovascularNormal S1,S2.  No m/r/g.   Abdomen: Benign, Soft, non-tender. Neurology UE/LE 5/5 strength, no focal deficits Ext pulses intact, cap refill intact ALL OTHER ROS ARE NEGATIVE     IMAGING   CT of the chest 2023 reviewed in detail with patient today Areas of bronchiectasis areas of interstitial lung disease b/l  LLL Nodules 7.2MM, no masses seen     ASSESSMENT/PLAN  73 year old pleasant white male seen today for assessment of COPD in the setting of extensive smoking history 1 pack a day for the last 50 years with abnormal CT chest findings with interstitial lung disease bronchiectasis and then some emphysematous changes with ongoing  chemotherapy with colon cancer and deconditioned state  Assessment of COPD FEV1 48% predicted Severe COPD Continue Trelegy inhaler Will start Daliresp  medication Continue albuterol  as needed Avoid Allergens and Irritants Avoid secondhand smoke Avoid SICK contacts Recommend  Masking  when appropriate Recommend Keep up-to-date with vaccinations  Chronic bronchitis with chronic bronchiectasis Patient using incentive spirometry Plan to start flutter valve Plan to start Smart vest CT scans show bilateral bronchiectasis Patient has had daily productive cough greater than 6 months plus. Tried and failed airway clearance despite  using incentive spirometry   Shortness of breath and dyspnea exertion Ambulatory pulse ox in the office did not reveal any significant hypoxia Patient had a previous positive overnight pulse oximetry I have explained to him he will need oxygen at nighttime   Abnormal CT chest Areas of interstitial lung disease emphysema and bronchiectasis Most of his disease is caused by smoking     Smoking history-follow-up lung cancer screening protocol CT scans of the chest reviewed in detail with patient Follow-up annually Smoking Assessment and Cessation Counseling Upon further questioning, Patient smokes 1 I have advised patient to quit/stop smoking as soon as possible due to high risk for multiple medical problems Patient  is NOT willing to quit smoking I have advised patient that we can assist and have options of Nicotine  replacement therapy. I also advised patient on behavioral therapy and can provide oral medication therapy in conjunction with the other therapies Follow up next Office visit  for assessment of smoking cessation Smoking cessation counseling advised for >10 minutes    MEDICATION ADJUSTMENTS/LABS AND TESTS ORDERED: Recommend stop smoking Trelegy one puff daily Start oxygen at night Start Flonase  start  Claritin Start Daliresp  Avoid Allergens  and Irritants Avoid secondhand smoke Avoid SICK contacts Recommend  Masking  when appropriate Recommend Keep up-to-date with vaccinations Follow-up lung cancer screening protocol  CURRENT MEDICATIONS REVIEWED AT LENGTH WITH PATIENT TODAY   Patient satisfied with Plan of action and management. All questions answered  Follow up 3 months   I spent a total of 44 minutes dedicated to the care of this patient on the date of this encounter to include pre-visit review of records, face-to-face time with the patient discussing conditions above, post visit ordering of testing, clinical documentation with the electronic health record, making appropriate referrals as documented, and communicating necessary information to the patient's healthcare team.    The Patient requires high complexity decision making for assessment and support, frequent evaluation and titration of therapies, application of advanced monitoring technologies and extensive interpretation of multiple databases.  Patient satisfied with Plan of action and management. All questions answered    Nickolas Alm Cellar, M.D.  [facility]  Medical Director [facility] Medical Director [facility] Cardio-Pulmonary Department

## 2024-05-29 NOTE — Patient Instructions (Signed)
 Please start oxygen at night 2 L [place] Please start FLONASE 2 sprays each nostril twice daily for nasal congestion Start Claritan every night for nasal congestion  Start FLutter Valve 10 times per day Start SMART VEST device and assessment to promote coughing Continue Trelegy as prescribed Start Daliresp 500 mg daily for your breathing ALBUTEROL  AS NEEDED   PLEASE STOP SMOKING!!!

## 2024-05-29 NOTE — Progress Notes (Signed)
 Subjective:   Colin Griffin is a 73 y.o. who presents for a Medicare Wellness preventive visit.  As a reminder, Annual Wellness Visits don't include a physical exam, and some assessments may be limited, especially if this visit is performed virtually. We may recommend an in-person follow-up visit with your provider if needed.  Visit Complete: Virtual I connected with  Colin Griffin on 05/29/24 by a audio enabled telemedicine application and verified that I am speaking with the correct person using two identifiers.  Patient Location: Home  Provider Location: Home Office  I discussed the limitations of evaluation and management by telemedicine. The patient expressed understanding and agreed to proceed.  Vital Signs: Because this visit was a virtual/telehealth visit, some criteria may be missing or patient reported. Any vitals not documented were not able to be obtained and vitals that have been documented are patient reported.  VideoDeclined- This patient declined Librarian, academic. Therefore the visit was completed with audio only.  Persons Participating in Visit: Patient.  AWV Questionnaire: No: Patient Medicare AWV questionnaire was not completed prior to this visit.  Cardiac Risk Factors include: advanced age (>28men, >47 women);diabetes mellitus;male gender;smoking/ tobacco exposure     Objective:    Today's Vitals   05/29/24 0811  PainSc: 0-No pain   There is no height or weight on file to calculate BMI.     05/29/2024    8:16 AM 05/21/2024   12:59 PM 05/16/2024    9:36 AM 04/09/2024   11:17 AM 04/03/2024    1:56 PM 03/18/2024    8:17 AM 03/04/2024    8:19 AM  Advanced Directives  Does Patient Have a Medical Advance Directive? No No No No No No No  Would patient like information on creating a medical advance directive? No - Patient declined No - Patient declined No - Patient declined  No - Patient declined No - Patient declined No -  Patient declined    Current Medications (verified) Outpatient Encounter Medications as of 05/29/2024  Medication Sig   albuterol  (VENTOLIN  HFA) 108 (90 Base) MCG/ACT inhaler Inhale 2 puffs into the lungs every 6 (six) hours as needed for wheezing or shortness of breath.   Continuous Glucose Sensor (FREESTYLE LIBRE 3 SENSOR) MISC PLACE 1 SENSOR ON THE SKIN EVERY 14 DAYS. USE TO CHECK GLUCOSE CONTINUOUSLY   Dulaglutide  (TRULICITY ) 0.75 MG/0.5ML SOAJ INJECT 0.75 MG SUBCUTANEOUSLY ONE TIME PER WEEK   Fluticasone-Umeclidin-Vilant (TRELEGY ELLIPTA ) 200-62.5-25 MCG/ACT AEPB Inhale 1 Act into the lungs daily.   lidocaine -prilocaine  (EMLA ) cream Apply 1 Application topically as needed.   metFORMIN  (GLUCOPHAGE -XR) 500 MG 24 hr tablet TAKE 1 TABLET BY MOUTH EVERY DAY WITH BREAKFAST   ferrous sulfate  325 (65 FE) MG tablet Take 1 tablet (325 mg total) by mouth 2 (two) times daily with a meal. (Patient not taking: Reported on 05/29/2024)   Fluticasone-Umeclidin-Vilant (TRELEGY ELLIPTA ) 200-62.5-25 MCG/ACT AEPB Inhale 1 Inhalation into the lungs daily in the afternoon.   Fluticasone-Umeclidin-Vilant (TRELEGY ELLIPTA ) 200-62.5-25 MCG/ACT AEPB Inhale 1 puff into the lungs daily.   Na Sulfate-K Sulfate-Mg Sulfate concentrate (SUPREP BOWEL PREP KIT) 17.5-3.13-1.6 GM/177ML SOLN Take 1 kit (354 mLs total) by mouth as directed. For colonoscopy prep   nicotine  (NICODERM CQ  - DOSED IN MG/24 HOURS) 21 mg/24hr patch Place 1 patch (21 mg total) onto the skin daily. (Patient not taking: Reported on 05/29/2024)   ondansetron  (ZOFRAN ) 8 MG tablet One pill every 8 hours as needed for nausea/vomitting. (Patient not taking:  Reported on 05/29/2024)   predniSONE  (DELTASONE ) 20 MG tablet Take 1 tablet (20 mg total) by mouth daily with breakfast. 10 days (Patient not taking: Reported on 05/29/2024)   No facility-administered encounter medications on file as of 05/29/2024.    Allergies (verified) Patient has no known allergies.    History: Past Medical History:  Diagnosis Date   Cancer [facility]) April 2024   Constipation 03/04/2023   Diabetes mellitus without complication [facility]) March 2024   Past Surgical History:  Procedure Laterality Date   APPENDECTOMY     BIOPSY  06/29/2023   Procedure: BIOPSY;  Surgeon: Wilhelmenia Aloha Raddle., MD;  Location: [facility];  Service: Gastroenterology;;   COLONOSCOPY WITH PROPOFOL  N/A 04/12/2023   Procedure: COLONOSCOPY WITH PROPOFOL ;  Surgeon: Unk Corinn Skiff, MD;  Location: [facility];  Service: Gastroenterology;  Laterality: N/A;   COLONOSCOPY WITH PROPOFOL  N/A 06/29/2023   Procedure: COLONOSCOPY WITH PROPOFOL ;  Surgeon: Mansouraty, Aloha Raddle., MD;  Location: [facility];  Service: Gastroenterology;  Laterality: N/A;   ENDOSCOPIC MUCOSAL RESECTION N/A 06/29/2023   Procedure: ENDOSCOPIC MUCOSAL RESECTION;  Surgeon: Wilhelmenia Aloha Raddle., MD;  Location: [facility];  Service: Gastroenterology;  Laterality: N/A;   FLEXIBLE SIGMOIDOSCOPY N/A 09/04/2023   Procedure: FLEXIBLE SIGMOIDOSCOPY;  Surgeon: Teresa Lonni HERO, MD;  Location: [facility];  Service: General;  Laterality: N/A;   HEMOSTASIS CONTROL  06/29/2023   Procedure: HEMOSTASIS CONTROL;  Surgeon: Wilhelmenia Aloha Raddle., MD;  Location: [facility];  Service: Gastroenterology;;   IR IMAGING GUIDED PORT INSERTION  09/26/2023   IR RADIOLOGIST EVAL & MGMT  03/16/2023   PELVIC ABCESS DRAINAGE     March 2024   POLYPECTOMY  06/29/2023   Procedure: POLYPECTOMY;  Surgeon: Mansouraty, Aloha Raddle., MD;  Location: [facility];  Service: Gastroenterology;;   ROBLEY LIFTING INJECTION  06/29/2023   Procedure: SUBMUCOSAL LIFTING INJECTION;  Surgeon: Wilhelmenia Aloha Raddle., MD;  Location: [facility];  Service: Gastroenterology;;   SUBMUCOSAL TATTOO INJECTION  06/29/2023   Procedure: SUBMUCOSAL TATTOO INJECTION;  Surgeon: Wilhelmenia Aloha Raddle., MD;  Location: [facility];  Service: Gastroenterology;;   TRANSURETHRAL RESECTION  OF PROSTATE N/A 03/03/2023   Procedure: TRANSURETHRAL RESECTION OF THE PROSTATE (TURP)/ UNROOFING OF PROSTATE ABSCESS;  Surgeon: Twylla Glendia BROCKS, MD;  Location: [facility];  Service: Urology;  Laterality: N/A;   Family History  Problem Relation Age of Onset   Atrial fibrillation Mother    Skin cancer Father        non-melanoma   Colon cancer Sister 37       Lynch Syndrome   Breast cancer Sister 76   Diabetes Brother    Throat cancer Maternal Uncle    Breast cancer Paternal Aunt 68 - 36   Colon cancer Maternal Grandmother 80   Alcohol abuse Maternal Grandfather    Breast cancer Paternal Grandmother 102   Prostate cancer Paternal Grandfather 17 - 81   Other Niece        2 nieces have Lynch Syndrome (brother's daughters)   Diabetes Other    Ovarian cancer Other 61       paternal first cousin once removed   Colon polyps Neg Hx    Stomach cancer Neg Hx    Esophageal cancer Neg Hx    Inflammatory bowel disease Neg Hx    Liver disease Neg Hx    Pancreatic cancer Neg Hx    Social History   Socioeconomic History   Marital status: Married    Spouse name: Jennia Spainhour   Number  of children: 1   Years of education: Not on file   Highest education level: Master's degree (e.g., MA, MS, MEng, MEd, MSW, MBA)  Occupational History   Not on file  Tobacco Use   Smoking status: Every Day    Current packs/day: 0.50    Average packs/day: 1.5 packs/day for 54.5 years (81.5 ttl pk-yrs)    Types: Cigarettes    Start date: 65   Smokeless tobacco: Never   Tobacco comments:    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        Has cut down to half a pack and smoking half of the cigarette a day 04/17/24  Vaping Use   Vaping status: Some Days   Start date: 08/08/2022   Substances: Flavoring  Substance and Sexual Activity   Alcohol use: Not Currently   Drug use: Never   Sexual activity: Not Currently    Birth control/protection: None  Other Topics Concern    Not on file  Social History Narrative   ** Merged History Encounter **       Retired from Holiday representative for 30 years    Social Drivers of Corporate investment banker Strain: Low Risk  (05/29/2024)   Overall Financial Resource Strain (CARDIA)    Difficulty of Paying Living Expenses: Not hard at all  Food Insecurity: No Food Insecurity (05/29/2024)   Hunger Vital Sign    Worried About Running Out of Food in the Last Year: Never true    Ran Out of Food in the Last Year: Never true  Transportation Needs: No Transportation Needs (05/29/2024)   PRAPARE - Administrator, Civil Service (Medical): No    Lack of Transportation (Non-Medical): No  Physical Activity: Sufficiently Active (05/29/2024)   Exercise Vital Sign    Days of Exercise per Week: 3 days    Minutes of Exercise per Session: 60 min  Stress: No Stress Concern Present (05/29/2024)   [employer] of Occupational Health - Occupational Stress Questionnaire    Feeling of Stress: Not at all  Social Connections: Socially Integrated (05/29/2024)   Social Connection and Isolation Panel    Frequency of Communication with Friends and Family: More than three times a week    Frequency of Social Gatherings with Friends and Family: More than three times a week    Attends Religious Services: More than 4 times per year    Active Member of [employer] or Organizations: Yes    Attends Engineer, structural: More than 4 times per year    Marital Status: Married    Tobacco Counseling Ready to quit: Not Answered Counseling given: Not Answered Tobacco comments: Reports that at one time he smoked 3 packs per day while in the [employer]  Has cut down to half a pack and smoking half of the cigarette a day 04/17/24    Clinical Intake:  Pre-visit preparation completed: Yes  Pain : No/denies pain Pain Score: 0-No pain     BMI - recorded: 22.8 Nutritional Status: BMI of 19-24  Normal Nutritional Risks: None Diabetes:  Yes CBG done?: No Did pt. bring in CBG monitor from home?: No  Lab Results  Component Value Date   HGBA1C 6.3 (H) 08/29/2023   HGBA1C 7.4 (A) 05/15/2023   HGBA1C 11.1 (H) 02/14/2023     How often do you need to have someone help you when you read instructions, pamphlets, or other written materials from  your doctor or pharmacy?: 1 - Never  Interpreter Needed?: No  Information entered by :: JHONNIE DAS, LPN   Activities of Daily Living     05/29/2024    8:20 AM 05/27/2024    8:19 AM  In your present state of health, do you have any difficulty performing the following activities:  Hearing? 0 0  Vision? 0 0  Difficulty concentrating or making decisions? 0 0  Walking or climbing stairs? 0 0  Dressing or bathing? 0 0  Doing errands, shopping? 0 0  Preparing Food and eating ? N N  Using the Toilet? N N  In the past six months, have you accidently leaked urine? Y Y  Do you have problems with loss of bowel control? N N  Managing your Medications? N N  Managing your Finances? N N  Housekeeping or managing your Housekeeping? N N    Patient Care Team: Sharma Coyer, MD as PCP - General (Family Medicine) Maurie Rayfield BIRCH, RN as Oncology Nurse Navigator Rennie Cindy SAUNDERS, MD as Consulting Physician (Oncology) Kasa, Kurian, MD as Consulting Physician (Pulmonary Disease)  I have updated your Care Teams any recent Medical Services you may have received from other providers in the past year.     Assessment:   This is a routine wellness examination for [place].  Hearing/Vision screen Hearing Screening - Comments:: NO AIDS Vision Screening - Comments:: READERS-    Goals Addressed             This Visit's Progress    DIET - EAT MORE FRUITS AND VEGETABLES         Depression Screen     05/29/2024    8:15 AM 09/19/2023    2:13 PM 05/15/2023    3:58 PM 05/02/2023   10:00 AM 03/16/2023    2:49 PM 02/14/2023    2:20 PM  PHQ 2/9 Scores  PHQ - 2 Score 0 0 0 0 0 0   PHQ- 9 Score 0  0  0 0    Fall Risk     05/29/2024    8:20 AM 05/27/2024    8:19 AM 05/15/2023    3:59 PM 05/01/2023    5:41 PM 03/16/2023    2:49 PM  Fall Risk   Falls in the past year? 1 1 1 1  0  Number falls in past yr: 0 0 1 1 0  Injury with Fall? 0 1 0 0 0  Risk for fall due to : History of fall(s)  History of fall(s) No Fall Risks No Fall Risks  Follow up Falls evaluation completed;Falls prevention discussed  Falls evaluation completed Falls prevention discussed;Education provided     MEDICARE RISK AT HOME:  Medicare Risk at Home Any stairs in or around the home?: Yes If so, are there any without handrails?: No Home free of loose throw rugs in walkways, pet beds, electrical cords, etc?: Yes Adequate lighting in your home to reduce risk of falls?: Yes Life alert?: No Use of a cane, walker or w/c?: No Grab bars in the bathroom?: Yes Shower chair or bench in shower?: No Elevated toilet seat or a handicapped toilet?: No  TIMED UP AND GO:  Was the test performed?  No  Cognitive Function: 6CIT completed        05/29/2024    8:22 AM 05/02/2023   10:08 AM  6CIT Screen  What Year? 0 points 0 points  What month? 0 points 0 points  What time? 0 points 0  points  Count back from 20 0 points 0 points  Months in reverse 0 points 0 points  Repeat phrase 0 points 0 points  Total Score 0 points 0 points    Immunizations Immunization History  Administered Date(s) Administered   PFIZER(Purple Top)SARS-COV-2 Vaccination 01/13/2020, 02/03/2020    Screening Tests Health Maintenance  Topic Date Due   OPHTHALMOLOGY EXAM  Never done   Hepatitis C Screening  Never done   DTaP/Tdap/Td (1 - Tdap) Never done   Pneumococcal Vaccine: 50+ Years (1 of 2 - PCV) Never done   Zoster Vaccines- Shingrix (1 of 2) Never done   COVID-19 Vaccine (3 - Pfizer risk series) 03/02/2020   HEMOGLOBIN A1C  02/27/2024   Diabetic kidney evaluation - Urine ACR  05/14/2024   FOOT EXAM  05/14/2024    Colonoscopy  06/28/2024   INFLUENZA VACCINE  06/28/2024   Lung Cancer Screening  10/08/2024   Diabetic kidney evaluation - eGFR measurement  05/21/2025   Medicare Annual Wellness (AWV)  05/29/2025   Hepatitis B Vaccines  Aged Out   HPV VACCINES  Aged Out   Meningococcal B Vaccine  Aged Out    Health Maintenance  Health Maintenance Due  Topic Date Due   OPHTHALMOLOGY EXAM  Never done   Hepatitis C Screening  Never done   DTaP/Tdap/Td (1 - Tdap) Never done   Pneumococcal Vaccine: 50+ Years (1 of 2 - PCV) Never done   Zoster Vaccines- Shingrix (1 of 2) Never done   COVID-19 Vaccine (3 - Pfizer risk series) 03/02/2020   HEMOGLOBIN A1C  02/27/2024   Diabetic kidney evaluation - Urine ACR  05/14/2024   FOOT EXAM  05/14/2024   Colonoscopy  06/28/2024   Health Maintenance Items Addressed: UP TO DATE ON COLONOSCOPY; WANTS NO VACCINES- NEEDS TDAP, PNA, SHINGRIX, COVID  Additional Screening:  Vision Screening: Recommended annual ophthalmology exams for early detection of glaucoma and other disorders of the eye. Would you like a referral to an eye doctor? No    Dental Screening: Recommended annual dental exams for proper oral hygiene  Community Resource Referral / Chronic Care Management: CRR required this visit?  No   CCM required this visit?  No   Plan:    I have personally reviewed and noted the following in the patient's chart:   Medical and social history Use of alcohol, tobacco or illicit drugs  Current medications and supplements including opioid prescriptions. Patient is not currently taking opioid prescriptions. Functional ability and status Nutritional status Physical activity Advanced directives List of other physicians Hospitalizations, surgeries, and ER visits in previous 12 months Vitals Screenings to include cognitive, depression, and falls Referrals and appointments  In addition, I have reviewed and discussed with patient certain preventive protocols,  quality metrics, and best practice recommendations. A written personalized care plan for preventive services as well as general preventive health recommendations were provided to patient.   Jhonnie GORMAN Das, LPN   01/04/7973   After Visit Summary: (MyChart) Due to this being a telephonic visit, the after visit summary with patients personalized plan was offered to patient via MyChart   Notes: EYE REFERRAL SENT  COLON CANCER/PROSTATE CANCER HAVING LUNG ISSUES NOW

## 2024-05-30 ENCOUNTER — Other Ambulatory Visit: Payer: Self-pay

## 2024-05-30 NOTE — Addendum Note (Signed)
 Addended by: Zeeshan Korte on: 05/30/2024 09:42 AM   Modules accepted: Orders

## 2024-06-05 ENCOUNTER — Other Ambulatory Visit: Payer: Self-pay | Admitting: Internal Medicine

## 2024-06-11 ENCOUNTER — Ambulatory Visit

## 2024-06-11 ENCOUNTER — Telehealth: Payer: Self-pay | Admitting: Internal Medicine

## 2024-06-11 ENCOUNTER — Telehealth: Payer: Self-pay

## 2024-06-11 ENCOUNTER — Ambulatory Visit (AMBULATORY_SURGERY_CENTER): Admitting: *Deleted

## 2024-06-11 VITALS — Ht 73.0 in | Wt 163.0 lb

## 2024-06-11 DIAGNOSIS — Z800 Family history of malignant neoplasm of digestive organs: Secondary | ICD-10-CM

## 2024-06-11 DIAGNOSIS — Z860100 Personal history of colon polyps, unspecified: Secondary | ICD-10-CM

## 2024-06-11 DIAGNOSIS — Z85038 Personal history of other malignant neoplasm of large intestine: Secondary | ICD-10-CM

## 2024-06-11 NOTE — Telephone Encounter (Signed)
 No Show pre visit appointment.  Left message that the patient had missed his appt.  Let patient know that he needed to call back and reschedule the pre visit appointment by 4pm today or his colonoscopy would be cancelled for no show.

## 2024-06-11 NOTE — Telephone Encounter (Signed)
 No answer.  Left message that a nurse was trying to reach him for the pre visit appointment.  Will try back in 5 min

## 2024-06-11 NOTE — Telephone Encounter (Signed)
 Dr. Isaiah ordered a Smartvest for this patient. We have received a request from them Abron Palma   Phone Number: [phone]   Thank you for the referral for Mr. Godbey. We need a few things before we can send for prior auth department for insurance approval.   Can you please send RX for SmartVest for DX Bronchiectasis. The CT report dated 10/09/2023 confirms BE and clinic notes also mentions BE.  We also need 1 more antibiotic with date ( have one for 04/17/24 & 01/30/24) , or a cough note 6 months prior than 05/29/2024 OR addendum to clinic note on 05/29/2024 stating patient has had daily productive cough greater than 6 months plus. Lastly we need Tried and failed airway clearance therapy note. If you have any questions please call me [phone] Thank You!

## 2024-06-11 NOTE — Progress Notes (Signed)
 Pt's name and DOB verified at the beginning of the pre-visit wit 2 identifiers  Pt denies any difficulty with ambulating,sitting, laying down or rolling side to side  Pt has no issues moving head neck or swallowing  No egg or soy allergy known to patient   No issues known to pt with past sedation with any surgeries or procedures  No FH of Malignant Hyperthermia  Pt is not on home 02   Pt is not on blood thinners   Pt denies issues with constipation   Pt is not on dialysis  Pt denise any abnormal heart rhythms   Pt denies any upcoming cardiac testing  Patient's chart reviewed by Norleen Schillings CNRA prior to pre-visit and patient appropriate for the [facility].  Pre-visit completed and red dot placed by patient's name on their procedure day (on provider's schedule).    Visit by phon  Pt states weight  is 163 lb  IInstructions reviewed. Pt given  both [facility] main # and MD on call # prior to instructions.  Pt states understanding of instructions. Instructed pt to review instructions again prior to procedure and call main # given if has questions.. Pt states they will.   Instructed pt on where to find instructions on My Chart.

## 2024-06-12 ENCOUNTER — Ambulatory Visit: Payer: Self-pay | Admitting: Internal Medicine

## 2024-06-18 ENCOUNTER — Encounter: Payer: Self-pay | Admitting: Gastroenterology

## 2024-06-18 ENCOUNTER — Encounter (HOSPITAL_COMMUNITY): Payer: Self-pay | Admitting: Gastroenterology

## 2024-06-18 ENCOUNTER — Telehealth: Payer: Self-pay

## 2024-06-18 NOTE — Progress Notes (Signed)
 Attempted to obtain medical history for pre op call via telephone, unable to reach at this time. HIPAA compliant voicemail message left requesting return call to pre surgical testing department.

## 2024-06-18 NOTE — Telephone Encounter (Signed)
 Procedure:COLON Procedure date: 06/25/24 Procedure location: [facility] Arrival Time: 9:00 Spoke with the patient Y/N: Y Any prep concerns? N  Has the patient obtained the prep from the pharmacy ? Y Do you have a care partner and transportation: Y Any additional concerns? N

## 2024-06-19 NOTE — Progress Notes (Signed)
 Beryl Skates   PCP-Simmons-Robinson MD Cardiologist-n/a Pulmonologist-Kasa MD  Chest xray-04/03/24 PFTs-04/30/24 EKG-03/02/23 Echo-n/a Cath-n/a Stress-n/a ICD/PM- n/a GLP1-Trulicity  hold 1 week, last dose 7/15 Blood Thinner- n/a  HX: COPD(Severe), prostate and colon cancer, Emphysema, DM. Patient sees pulm MD, last visit was 04/17/24, was having SOB, cough, chronic bronchitis. At visit ordered flutter valve and smart vest. Patient said he hasn't received those yet but has inhaler that he uses and helps. Overall he said his breathing is a lot better, not having as frequent of a cough, only SOB on exertion, no 02 use or other equipment. Notes say, pulm clearance was faxed to GI. Anesthesia Review- Yes- since seen recently, and clearance okay to proceed

## 2024-06-25 ENCOUNTER — Other Ambulatory Visit: Payer: Self-pay

## 2024-06-25 ENCOUNTER — Encounter (HOSPITAL_COMMUNITY)
Admission: RE | Disposition: A | Discharge status: Home / Self Care | Payer: Self-pay | Source: Home / Self Care | Attending: Gastroenterology

## 2024-06-25 ENCOUNTER — Ambulatory Visit (HOSPITAL_COMMUNITY)
Admission: RE | Admit: 2024-06-25 | Discharge: 2024-06-25 | Disposition: A | Discharge status: Home / Self Care | Attending: Gastroenterology | Admitting: Gastroenterology

## 2024-06-25 ENCOUNTER — Ambulatory Visit (HOSPITAL_COMMUNITY): Payer: Self-pay | Admitting: Anesthesiology

## 2024-06-25 ENCOUNTER — Encounter (HOSPITAL_COMMUNITY): Payer: Self-pay | Admitting: Gastroenterology

## 2024-06-25 ENCOUNTER — Encounter (HOSPITAL_COMMUNITY): Payer: Self-pay | Admitting: Anesthesiology

## 2024-06-25 DIAGNOSIS — D120 Benign neoplasm of cecum: Secondary | ICD-10-CM

## 2024-06-25 DIAGNOSIS — K562 Volvulus: Secondary | ICD-10-CM

## 2024-06-25 DIAGNOSIS — D122 Benign neoplasm of ascending colon: Secondary | ICD-10-CM

## 2024-06-25 DIAGNOSIS — D124 Benign neoplasm of descending colon: Secondary | ICD-10-CM | POA: Diagnosis not present

## 2024-06-25 DIAGNOSIS — Z860101 Personal history of adenomatous and serrated colon polyps: Secondary | ICD-10-CM

## 2024-06-25 DIAGNOSIS — J439 Emphysema, unspecified: Secondary | ICD-10-CM | POA: Insufficient documentation

## 2024-06-25 DIAGNOSIS — D123 Benign neoplasm of transverse colon: Secondary | ICD-10-CM

## 2024-06-25 DIAGNOSIS — Z980 Intestinal bypass and anastomosis status: Secondary | ICD-10-CM

## 2024-06-25 DIAGNOSIS — F17210 Nicotine dependence, cigarettes, uncomplicated: Secondary | ICD-10-CM | POA: Diagnosis not present

## 2024-06-25 DIAGNOSIS — E119 Type 2 diabetes mellitus without complications: Secondary | ICD-10-CM | POA: Diagnosis not present

## 2024-06-25 DIAGNOSIS — K640 First degree hemorrhoids: Secondary | ICD-10-CM

## 2024-06-25 DIAGNOSIS — K644 Residual hemorrhoidal skin tags: Secondary | ICD-10-CM | POA: Insufficient documentation

## 2024-06-25 DIAGNOSIS — Z800 Family history of malignant neoplasm of digestive organs: Secondary | ICD-10-CM | POA: Insufficient documentation

## 2024-06-25 DIAGNOSIS — E1165 Type 2 diabetes mellitus with hyperglycemia: Secondary | ICD-10-CM

## 2024-06-25 DIAGNOSIS — Z85038 Personal history of other malignant neoplasm of large intestine: Secondary | ICD-10-CM

## 2024-06-25 DIAGNOSIS — D649 Anemia, unspecified: Secondary | ICD-10-CM | POA: Insufficient documentation

## 2024-06-25 DIAGNOSIS — Z8546 Personal history of malignant neoplasm of prostate: Secondary | ICD-10-CM | POA: Diagnosis not present

## 2024-06-25 DIAGNOSIS — T184XXA Foreign body in colon, initial encounter: Secondary | ICD-10-CM | POA: Diagnosis not present

## 2024-06-25 DIAGNOSIS — Z803 Family history of malignant neoplasm of breast: Secondary | ICD-10-CM | POA: Diagnosis not present

## 2024-06-25 DIAGNOSIS — C186 Malignant neoplasm of descending colon: Secondary | ICD-10-CM

## 2024-06-25 DIAGNOSIS — Z1211 Encounter for screening for malignant neoplasm of colon: Secondary | ICD-10-CM

## 2024-06-25 DIAGNOSIS — K641 Second degree hemorrhoids: Secondary | ICD-10-CM | POA: Diagnosis not present

## 2024-06-25 DIAGNOSIS — K635 Polyp of colon: Secondary | ICD-10-CM

## 2024-06-25 DIAGNOSIS — Z7984 Long term (current) use of oral hypoglycemic drugs: Secondary | ICD-10-CM | POA: Diagnosis not present

## 2024-06-25 HISTORY — PX: COLONOSCOPY: SHX5424

## 2024-06-25 HISTORY — PX: ENDOSCOPIC MUCOSAL RESECTION: SHX6839

## 2024-06-25 LAB — GLUCOSE, CAPILLARY: Glucose-Capillary: 160 mg/dL — ABNORMAL HIGH (ref 70–99)

## 2024-06-25 SURGERY — COLONOSCOPY
Anesthesia: Monitor Anesthesia Care

## 2024-06-25 MED ORDER — SPOT INK MARKER SYRINGE KIT
PACK | SUBMUCOSAL | Status: DC | PRN
  Administered 2024-06-25: 1 mL via SUBMUCOSAL

## 2024-06-25 MED ORDER — SODIUM CHLORIDE 0.9 % IV SOLN: INTRAVENOUS | Status: DC

## 2024-06-25 MED ORDER — PROPOFOL 1000 MG/100ML IV EMUL
INTRAVENOUS | Status: AC
  Filled 2024-06-25: qty 100

## 2024-06-25 MED ORDER — SODIUM CHLORIDE 0.9 % IV SOLN: INTRAVENOUS | Status: DC | PRN

## 2024-06-25 MED ORDER — PROPOFOL 500 MG/50ML IV EMUL
INTRAVENOUS | Status: DC | PRN
  Administered 2024-06-25: 115 ug/kg/min via INTRAVENOUS

## 2024-06-25 MED ORDER — LIDOCAINE 2% (20 MG/ML) 5 ML SYRINGE
INTRAMUSCULAR | Status: DC | PRN
  Administered 2024-06-25: 100 mg via INTRAVENOUS

## 2024-06-25 MED ORDER — SPOT INK MARKER SYRINGE KIT
PACK | SUBMUCOSAL | Status: AC
  Filled 2024-06-25: qty 5

## 2024-06-25 MED ORDER — PROPOFOL 500 MG/50ML IV EMUL
INTRAVENOUS | Status: AC
  Filled 2024-06-25: qty 50

## 2024-06-25 NOTE — H&P (Signed)
 GASTROENTEROLOGY PROCEDURE H&P NOTE   Primary Care Physician: Sharma Coyer, MD  HPI: Colin Griffin is a 73 y.o. male  who presents for Colonoscopy for surveillance of multiple large EMRs of the Colon in 2024 with findings of Colon Cancer s/p colectomy as well.  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   Constipation 03/04/2023   COPD (chronic obstructive pulmonary disease) [facility]) May 2025   Diabetes mellitus without complication [facility]) March 2024   Emphysema of lung [facility]) May 2025   Past Surgical History:  Procedure Laterality Date   APPENDECTOMY     BIOPSY  06/29/2023   Procedure: BIOPSY;  Surgeon: Wilhelmenia Aloha Raddle., MD;  Location: [facility];  Service: Gastroenterology;;   COLONOSCOPY WITH PROPOFOL  N/A 04/12/2023   Procedure: COLONOSCOPY WITH PROPOFOL ;  Surgeon: Unk Corinn Skiff, MD;  Location: [facility];  Service: Gastroenterology;  Laterality: N/A;   COLONOSCOPY WITH PROPOFOL  N/A 06/29/2023   Procedure: COLONOSCOPY WITH PROPOFOL ;  Surgeon: Wilhelmenia Aloha Raddle., MD;  Location: [facility];  Service: Gastroenterology;  Laterality: N/A;   ENDOSCOPIC MUCOSAL RESECTION N/A 06/29/2023   Procedure: ENDOSCOPIC MUCOSAL RESECTION;  Surgeon: Wilhelmenia Aloha Raddle., MD;  Location: [facility];  Service: Gastroenterology;  Laterality: N/A;   FLEXIBLE SIGMOIDOSCOPY N/A 09/04/2023   Procedure: FLEXIBLE SIGMOIDOSCOPY;  Surgeon: Teresa Lonni HERO, MD;  Location: [facility];  Service: General;  Laterality: N/A;   HEMOSTASIS CONTROL  06/29/2023   Procedure: HEMOSTASIS CONTROL;  Surgeon: Wilhelmenia Aloha Raddle., MD;  Location: [facility];  Service: Gastroenterology;;   IR IMAGING GUIDED PORT INSERTION  09/26/2023   IR RADIOLOGIST EVAL & MGMT  03/16/2023   PELVIC ABCESS DRAINAGE     March 2024   POLYPECTOMY  06/29/2023   Procedure: POLYPECTOMY;  Surgeon: Mansouraty, Aloha Raddle., MD;  Location: [facility];  Service:  Gastroenterology;;   ROBLEY LIFTING INJECTION  06/29/2023   Procedure: SUBMUCOSAL LIFTING INJECTION;  Surgeon: Wilhelmenia Aloha Raddle., MD;  Location: [facility];  Service: Gastroenterology;;   SUBMUCOSAL TATTOO INJECTION  06/29/2023   Procedure: SUBMUCOSAL TATTOO INJECTION;  Surgeon: Wilhelmenia Aloha Raddle., MD;  Location: [facility];  Service: Gastroenterology;;   TRANSURETHRAL RESECTION OF PROSTATE N/A 03/03/2023   Procedure: TRANSURETHRAL RESECTION OF THE PROSTATE (TURP)/ UNROOFING OF PROSTATE ABSCESS;  Surgeon: Twylla Glendia BROCKS, MD;  Location: [facility];  Service: Urology;  Laterality: N/A;   No current facility-administered medications for this encounter.   No current facility-administered medications for this encounter. No Known Allergies Family History  Problem Relation Age of Onset   Atrial fibrillation Mother    Skin cancer Father        non-melanoma   Colon cancer Sister 75       Lynch Syndrome   Breast cancer Sister 23   Diabetes Brother    Throat cancer Maternal Uncle    Breast cancer Paternal Aunt 35 - 36   Colon cancer Maternal Grandmother 27   Cancer Maternal Grandmother    Alcohol abuse Maternal Grandfather    Breast cancer Paternal Grandmother 102   Prostate cancer Paternal Grandfather 69 - 57   Other Niece        2 nieces have Lynch Syndrome (brother's daughters)   Colon cancer Other    Diabetes Other    Ovarian cancer Other 18       paternal first cousin once removed   Colon polyps Neg Hx    Stomach cancer Neg Hx    Esophageal  cancer Neg Hx    Inflammatory bowel disease Neg Hx    Liver disease Neg Hx    Pancreatic cancer Neg Hx    Rectal cancer Neg Hx    Social History   Socioeconomic History   Marital status: Married    Spouse name: Jennia Spainhour   Number of children: 1   Years of education: Not on file   Highest education level: Master's degree (e.g., MA, MS, MEng, MEd, MSW, MBA)  Occupational History   Not on file  Tobacco Use    Smoking status: Every Day    Current packs/day: 0.50    Average packs/day: 1.5 packs/day for 54.6 years (81.5 ttl pk-yrs)    Types: Cigarettes    Start date: 78   Smokeless tobacco: Never   Tobacco comments:    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        Has cut down to half a pack and smoking half of the cigarette a day 04/17/24  Vaping Use   Vaping status: Former   Start date: 08/08/2022   Substances: Flavoring  Substance and Sexual Activity   Alcohol use: Not Currently   Drug use: Never   Sexual activity: Not Currently    Birth control/protection: None  Other Topics Concern   Not on file  Social History Narrative   ** Merged History Encounter **       Retired from Holiday representative for 30 years    Social Drivers of Corporate investment banker Strain: Low Risk  (05/29/2024)   Overall Financial Resource Strain (CARDIA)    Difficulty of Paying Living Expenses: Not hard at all  Food Insecurity: No Food Insecurity (05/29/2024)   Hunger Vital Sign    Worried About Running Out of Food in the Last Year: Never true    Ran Out of Food in the Last Year: Never true  Transportation Needs: No Transportation Needs (05/29/2024)   PRAPARE - Administrator, Civil Service (Medical): No    Lack of Transportation (Non-Medical): No  Physical Activity: Sufficiently Active (05/29/2024)   Exercise Vital Sign    Days of Exercise per Week: 3 days    Minutes of Exercise per Session: 60 min  Stress: No Stress Concern Present (05/29/2024)   [employer] of Occupational Health - Occupational Stress Questionnaire    Feeling of Stress: Not at all  Social Connections: Socially Integrated (05/29/2024)   Social Connection and Isolation Panel    Frequency of Communication with Friends and Family: More than three times a week    Frequency of Social Gatherings with Friends and Family: More than three times a week    Attends Religious Services: More than 4 times  per year    Active Member of [employer] or Organizations: Yes    Attends Banker Meetings: More than 4 times per year    Marital Status: Married  Catering manager Violence: Not At Risk (05/29/2024)   Humiliation, Afraid, Rape, and Kick questionnaire    Fear of Current or Ex-Partner: No    Emotionally Abused: No    Physically Abused: No    Sexually Abused: No    Physical Exam: There were no vitals filed for this visit. There is no height or weight on file to calculate BMI. GEN: NAD EYE: Sclerae anicteric ENT: MMM CV: Non-tachycardic GI: Soft, NT/ND NEURO:  Alert & Oriented x 3  Lab Results: No results for input(s):  WBC, HGB, HCT, PLT in the last 72 hours. BMET No results for input(s): NA, K, CL, CO2, GLUCOSE, BUN, CREATININE, CALCIUM  in the last 72 hours. LFT No results for input(s): PROT, ALBUMIN, AST, ALT, ALKPHOS, BILITOT, BILIDIR, IBILI in the last 72 hours. PT/INR No results for input(s): LABPROT, INR in the last 72 hours.   Impression / Plan: This is a 73 y.o.male who presents for Colonoscopy for surveillance of multiple large EMRs of the Colon in 2024 with findings of Colon Cancer s/p colectomy as well.  The risks and benefits of endoscopic evaluation/treatment were discussed with the patient and/or family; these include but are not limited to the risk of perforation, infection, bleeding, missed lesions, lack of diagnosis, severe illness requiring hospitalization, as well as anesthesia and sedation related illnesses.  The patient's history has been reviewed, patient examined, no change in status, and deemed stable for procedure.  The patient and/or family is agreeable to proceed.    Aloha Finner, MD [facility]oscopy Office # [phone]

## 2024-06-25 NOTE — Op Note (Addendum)
 [facility] Patient Name: Colin Griffin Procedure Date: 06/25/2024 MRN: [id number] Attending MD: Aloha Finner , MD, [id number] Date of Birth: 19-Jan-1951 CSN: [id number] Age: 73 Admit Type: Outpatient Procedure:                Colonoscopy Indications:              High risk colon cancer surveillance: Personal                            history of adenoma (10 mm or greater in size), High                            risk colon cancer surveillance: Personal history of                            adenoma with high grade dysplasia, High risk colon                            cancer surveillance: Personal history of colon                            cancer Providers:                Aloha Finner, MD, Hoy Penner, RN,                            [facility] [place], Technician, Nancee Crass, CRNA Referring MD:              Medicines:                Monitored Anesthesia Care Complications:            No immediate complications. Estimated Blood Loss:     Estimated blood loss was minimal. Procedure:                Pre-Anesthesia Assessment:                           - Prior to the procedure, a History and Physical                            was performed, and patient medications and                            allergies were reviewed. The patient's tolerance of                            previous anesthesia was also reviewed. The risks                            and benefits of the procedure and the sedation                            options and risks were discussed with the patient.  All questions were answered, and informed consent                            was obtained. Prior Anticoagulants: The patient has                            taken no anticoagulant or antiplatelet agents. ASA                            Grade Assessment: III - A patient with severe                            systemic disease. After reviewing the risks and                             benefits, the patient was deemed in satisfactory                            condition to undergo the procedure.                           After obtaining informed consent, the colonoscope                            was passed under direct vision. Throughout the                            procedure, the patient's blood pressure, pulse, and                            oxygen saturations were monitored continuously. The                            CF-HQ190L ([id number]) Olympus colonoscope was                            introduced through the anus and advanced to the the                            cecum, identified by appendiceal orifice and                            ileocecal valve. The colonoscopy was performed                            without difficulty. The patient tolerated the                            procedure. The quality of the bowel preparation was                            adequate. The ileocecal valve, appendiceal orifice,  and rectum were photographed. Scope In: 10:42:42 AM Scope Out: 11:49:44 AM Scope Withdrawal Time: 1 hour 4 minutes 28 seconds  Total Procedure Duration: 1 hour 7 minutes 2 seconds  Findings:      The digital rectal exam findings include hemorrhoids. Pertinent       negatives include no palpable rectal lesions.      The colon (entire examined portion) revealed moderately excessive       looping.      There was evidence of a prior functional end-to-end colo-colonic       anastomosis in the recto-sigmoid colon (approximately 30 cm from anal       os). This was patent and was characterized by healthy appearing mucosa.       The anastomosis was traversed.      A medium post mucosectomy scar was found in the descending colon. There       was evidence of recurrent adenomatous polypoid tissue. The recurrence       was removed with a cold snare. Resection and retrieval were complete.      A large post mucosectomy scar was  found in the transverse colon. There       was recurrent adenomatous polypoid tissue. The recurrence was removed       with a cold snare. Resection and retrieval were complete.      A 35 mm polyp was found in the ascending colon. The polyp was sessile       and over 1-1/2 folds. Preparations were made for mucosal resection.       Demarcation of the lesion was performed with high-definition white light       and narrow band imaging to clearly identify the boundaries of the       lesion. EverLift was injected to raise the lesion. Piecemeal mucosal       resection using a snare was performed. Resection and retrieval were       complete. Resected tissue margins were examined and clear of polyp       tissue. Coagulation for tissue destruction using snare tip soft       coagulation to the margin was successful. To prevent bleeding after       mucosal resection, three hemostatic clips were successfully placed (MR       conditional). Clip manufacturer: [employer]. There was no       bleeding at the end of the procedure. Tattoo placed on contralateral       wall for demarcation purposes.      A medium post mucosectomy scar was found in the ascending colon. There       was evidence of recurrent adenomatous polypoid tissue. The recurrence       was removed with a cold snare. Resection and retrieval were complete.      A previously placed Hemoclip was found in the ascending colon. Removal       of an endoclip was accomplished with a snare.      Three sessile polyps were found in the ascending colon (2) and cecum       (1). The polyps were 3 to 4 mm in size. These polyps were removed with a       cold snare. Resection and retrieval were complete.      Normal mucosa was found in the entire colon otherwise.      Non-bleeding non-thrombosed external and internal hemorrhoids were found       during  retroflexion, during perianal exam and during digital exam. The       hemorrhoids were Grade II  (internal hemorrhoids that prolapse but reduce       spontaneously). Impression:               - Hemorrhoids found on digital rectal exam.                           - There was significant looping of the colon.                           - Patent functional end-to-end colo-colonic                            anastomosis (30 cm from anal os), characterized by                            healthy appearing mucosa.                           - Post mucosectomy scar in the descending colon.                            Recurrence noted. Tissue resected.                           - Post mucosectomy scar in the transverse colon.                            Recurrence noted. Tissue resected.                           - One 35 mm polyp in the ascending colon, removed                            with piecemeal mucosal resection. Resected and                            retrieved. Treated with STSC to margin. Clips (MR                            conditional) were placed. Clip manufacturer: [employer]. Tattoo placed on contralateral wall.                           - Post mucosectomy scar in the ascending colon.                            Recurrence noted. Tissue resected.                           - Three 3 to 4 mm polyps in the ascending colon and  in the cecum, removed with a cold snare. Resected                            and retrieved.                           - Normal mucosa in the entire examined colon                            otherwise.                           - Non-bleeding non-thrombosed external and internal                            hemorrhoids. Moderate Sedation:      Not Applicable - Patient had care per Anesthesia. Recommendation:           - The patient will be observed post-procedure,                            until all discharge criteria are met.                           - Discharge patient to home.                            - Patient has a contact number available for                            emergencies. The signs and symptoms of potential                            delayed complications were discussed with the                            patient. Return to normal activities tomorrow.                            Written discharge instructions were provided to the                            patient.                           - High fiber diet.                           - Use FiberCon 1-2 tablets PO daily.                           - Minimize all NSAID medications (Aleve, Motrin ,                            Ibuprofen , BC/Goody Powders, Naproxen) as able.                           -  Continue present medications.                           - Await pathology results.                           - Repeat colonoscopy in 6-12 months for                            surveillance.                           - The findings and recommendations were discussed                            with the patient.                           - The findings and recommendations were discussed                            with the patient's family. Procedure Code(s):        --- Professional ---                           [phone], Colonoscopy, flexible; with endoscopic                            mucosal resection                           [id number], 59, Colonoscopy, flexible; with removal of                            tumor(s), polyp(s), or other lesion(s) by snare                            technique Diagnosis Code(s):        --- Professional ---                           Z86.010, Personal history of colonic polyps                           Z85.038, Personal history of other malignant                            neoplasm of large intestine                           K64.1, Second degree hemorrhoids                           Z98.0, Intestinal bypass and anastomosis status                           Z98.890, Other specified postprocedural states  D12.2, Benign neoplasm of ascending colon                           D12.0, Benign neoplasm of cecum CPT copyright 2022 American Medical Association. All rights reserved. The codes documented in this report are preliminary and upon coder review may  be revised to meet current compliance requirements. Aloha Finner, MD 06/25/2024 12:08:54 PM Number of Addenda: 0

## 2024-06-25 NOTE — Anesthesia Preprocedure Evaluation (Addendum)
 Anesthesia Evaluation  Patient identified by MRN, date of birth, ID band Patient awake    Reviewed: Allergy & Precautions, NPO status , Patient's Chart, lab work & pertinent test results  Airway Mallampati: II  TM Distance: >3 FB Neck ROM: Full    Dental  (+) Teeth Intact, Dental Advisory Given, Caps   Pulmonary Current Smoker and Patient abstained from smoking.   Pulmonary exam normal breath sounds clear to auscultation       Cardiovascular negative cardio ROS Normal cardiovascular exam Rhythm:Regular Rate:Normal     Neuro/Psych negative neurological ROS     GI/Hepatic negative GI ROS, Neg liver ROS,,,  Endo/Other  diabetes, Type 2, Oral Hypoglycemic Agents    Renal/GU negative Renal ROS     Musculoskeletal negative musculoskeletal ROS (+)    Abdominal   Peds  Hematology  (+) Blood dyscrasia, anemia   Anesthesia Other Findings   Reproductive/Obstetrics                              Anesthesia Physical Anesthesia Plan  ASA: 3  Anesthesia Plan: MAC   Post-op Pain Management: Minimal or no pain anticipated   Induction: Intravenous  PONV Risk Score and Plan: 0 and Treatment may vary due to age or medical condition and Propofol  infusion  Airway Management Planned: Natural Airway and Simple Face Mask  Additional Equipment: None  Intra-op Plan:   Post-operative Plan:   Informed Consent: I have reviewed the patients History and Physical, chart, labs and discussed the procedure including the risks, benefits and alternatives for the proposed anesthesia with the patient or authorized representative who has indicated his/her understanding and acceptance.     Dental advisory given  Plan Discussed with: CRNA and Anesthesiologist  Anesthesia Plan Comments:         Anesthesia Quick Evaluation

## 2024-06-25 NOTE — Anesthesia Procedure Notes (Signed)
 Procedure Name: MAC Date/Time: 06/25/2024 10:30 AM  Performed by: Obadiah Reyes BROCKS, CRNAPre-anesthesia Checklist: Patient identified, Emergency Drugs available, Suction available, Patient being monitored and Timeout performed Patient Re-evaluated:Patient Re-evaluated prior to induction Oxygen Delivery Method: Nasal cannula Preoxygenation: Pre-oxygenation with 100% oxygen Induction Type: IV induction

## 2024-06-25 NOTE — Discharge Instructions (Signed)
 YOU HAD AN ENDOSCOPIC PROCEDURE TODAY: Refer to the procedure report and other information in the discharge instructions given to you for any specific questions about what was found during the examination. If this information does not answer your questions, please call [place] office at [phone] to clarify.   YOU SHOULD EXPECT: Some feelings of bloating in the abdomen. Passage of more gas than usual. Walking can help get rid of the air that was put into your GI tract during the procedure and reduce the bloating. If you had a lower endoscopy (such as a colonoscopy or flexible sigmoidoscopy) you may notice spotting of blood in your stool or on the toilet paper. Some abdominal soreness may be present for a day or two, also.  DIET: Your first meal following the procedure should be a light meal and then it is ok to progress to your normal diet. A half-sandwich or bowl of soup is an example of a good first meal. Heavy or fried foods are harder to digest and may make you feel nauseous or bloated. Drink plenty of fluids but you should avoid alcoholic beverages for 24 hours. If you had a esophageal dilation, please see attached instructions for diet.    ACTIVITY: Your care partner should take you home directly after the procedure. You should plan to take it easy, moving slowly for the rest of the day. You can resume normal activity the day after the procedure however YOU SHOULD NOT DRIVE, use power tools, machinery or perform tasks that involve climbing or major physical exertion for 24 hours (because of the sedation medicines used during the test).   SYMPTOMS TO REPORT IMMEDIATELY: A gastroenterologist can be reached at any hour. Please call [phone]  for any of the following symptoms:  Following lower endoscopy (colonoscopy, flexible sigmoidoscopy) Excessive amounts of blood in the stool  Significant tenderness, worsening of abdominal pains  Swelling of the abdomen that is new, acute  Fever of 100 or  higher  Following upper endoscopy (EGD, EUS, ERCP, esophageal dilation) Vomiting of blood or coffee ground material  New, significant abdominal pain  New, significant chest pain or pain under the shoulder blades  Painful or persistently difficult swallowing  New shortness of breath  Black, tarry-looking or red, bloody stools  FOLLOW UP:  If any biopsies were taken you will be contacted by phone or by letter within the next 1-3 weeks. Call [phone]  if you have not heard about the biopsies in 3 weeks.  Please also call with any specific questions about appointments or follow up tests.YOU HAD AN ENDOSCOPIC PROCEDURE TODAY: Refer to the procedure report and other information in the discharge instructions given to you for any specific questions about what was found during the examination. If this information does not answer your questions, please call [place] office at [phone] to clarify.   YOU SHOULD EXPECT: Some feelings of bloating in the abdomen. Passage of more gas than usual. Walking can help get rid of the air that was put into your GI tract during the procedure and reduce the bloating. If you had a lower endoscopy (such as a colonoscopy or flexible sigmoidoscopy) you may notice spotting of blood in your stool or on the toilet paper. Some abdominal soreness may be present for a day or two, also.  DIET: Your first meal following the procedure should be a light meal and then it is ok to progress to your normal diet. A half-sandwich or bowl of soup is an example of a  good first meal. Heavy or fried foods are harder to digest and may make you feel nauseous or bloated. Drink plenty of fluids but you should avoid alcoholic beverages for 24 hours. If you had a esophageal dilation, please see attached instructions for diet.    ACTIVITY: Your care partner should take you home directly after the procedure. You should plan to take it easy, moving slowly for the rest of the day. You can resume  normal activity the day after the procedure however YOU SHOULD NOT DRIVE, use power tools, machinery or perform tasks that involve climbing or major physical exertion for 24 hours (because of the sedation medicines used during the test).   SYMPTOMS TO REPORT IMMEDIATELY: A gastroenterologist can be reached at any hour. Please call [phone]  for any of the following symptoms:  Following lower endoscopy (colonoscopy, flexible sigmoidoscopy) Excessive amounts of blood in the stool  Significant tenderness, worsening of abdominal pains  Swelling of the abdomen that is new, acute  Fever of 100 or higher   FOLLOW UP:  If any biopsies were taken you will be contacted by phone or by letter within the next 1-3 weeks. Call [phone]  if you have not heard about the biopsies in 3 weeks.  Please also call with any specific questions about appointments or follow up tests.

## 2024-06-25 NOTE — Anesthesia Postprocedure Evaluation (Signed)
 Anesthesia Post Note  Patient: Colin Griffin  Procedure(s) Performed: COLONOSCOPY RESECTION, MUCOSAL LESION, GI TRACT, ENDOSCOPIC     Patient location during evaluation: PACU Anesthesia Type: MAC Level of consciousness: awake and alert Pain management: pain level controlled Vital Signs Assessment: post-procedure vital signs reviewed and stable Respiratory status: spontaneous breathing, nonlabored ventilation, respiratory function stable and patient connected to nasal cannula oxygen Cardiovascular status: stable and blood pressure returned to baseline Postop Assessment: no apparent nausea or vomiting Anesthetic complications: no   No notable events documented.  Last Vitals:  Vitals:   06/25/24 1210 06/25/24 1220  BP: (!) 107/58 (!) 113/57  Pulse: 77 78  Resp: 15 18  Temp:    SpO2: 97% 100%    Last Pain:  Vitals:   06/25/24 1220  TempSrc:   PainSc: 0-No pain                 Cordella P Tanyla Stege

## 2024-06-25 NOTE — Transfer of Care (Signed)
 Immediate Anesthesia Transfer of Care Note  Patient: Colin Griffin [facility]) Performed: COLONOSCOPY RESECTION, MUCOSAL LESION, GI TRACT, ENDOSCOPIC  Patient Location: Endoscopy Unit  Anesthesia Type:MAC  Level of Consciousness: awake and alert   Airway & Oxygen Therapy: Patient Spontanous Breathing and Patient connected to nasal cannula oxygen  Post-op Assessment: Report given to RN and Post -op Vital signs reviewed and stable  Post vital signs: Reviewed and stable  Last Vitals:  Vitals Value Taken Time  BP    Temp    Pulse    Resp    SpO2      Last Pain:  Vitals:   06/25/24 1158  TempSrc:   PainSc: Asleep         Complications: No notable events documented.

## 2024-06-26 ENCOUNTER — Encounter (HOSPITAL_COMMUNITY): Payer: Self-pay | Admitting: Gastroenterology

## 2024-07-01 LAB — SURGICAL PATHOLOGY

## 2024-07-03 ENCOUNTER — Ambulatory Visit: Payer: Self-pay | Admitting: Gastroenterology

## 2024-07-03 NOTE — Telephone Encounter (Signed)
 I have sent the update to [employer] waiting their response

## 2024-07-22 ENCOUNTER — Other Ambulatory Visit

## 2024-07-22 ENCOUNTER — Inpatient Hospital Stay

## 2024-07-22 ENCOUNTER — Encounter: Payer: Self-pay | Admitting: Internal Medicine

## 2024-07-22 ENCOUNTER — Inpatient Hospital Stay: Attending: Oncology | Admitting: Internal Medicine

## 2024-07-22 VITALS — BP 111/85 | HR 101 | Temp 98.2°F | Resp 20 | Ht 73.0 in | Wt 168.5 lb

## 2024-07-22 DIAGNOSIS — Z85038 Personal history of other malignant neoplasm of large intestine: Secondary | ICD-10-CM | POA: Diagnosis present

## 2024-07-22 DIAGNOSIS — C186 Malignant neoplasm of descending colon: Secondary | ICD-10-CM | POA: Diagnosis not present

## 2024-07-22 DIAGNOSIS — E1165 Type 2 diabetes mellitus with hyperglycemia: Secondary | ICD-10-CM | POA: Diagnosis not present

## 2024-07-22 LAB — CMP (CANCER CENTER ONLY)
ALT: 11 U/L (ref 0–44)
AST: 15 U/L (ref 15–41)
Albumin: 3.4 g/dL — ABNORMAL LOW (ref 3.5–5.0)
Alkaline Phosphatase: 58 U/L (ref 38–126)
Anion gap: 6 (ref 5–15)
BUN: 12 mg/dL (ref 8–23)
CO2: 16 mmol/L — ABNORMAL LOW (ref 22–32)
Calcium: 8 mg/dL — ABNORMAL LOW (ref 8.9–10.3)
Chloride: 111 mmol/L (ref 98–111)
Creatinine: 0.84 mg/dL (ref 0.61–1.24)
GFR, Estimated: >60 mL/min (ref >60)
Glucose, Bld: 135 mg/dL — ABNORMAL HIGH (ref 70–99)
Potassium: 3.3 mmol/L — ABNORMAL LOW (ref 3.5–5.1)
Sodium: 133 mmol/L — ABNORMAL LOW (ref 135–145)
Total Bilirubin: 0.4 mg/dL (ref 0.0–1.2)
Total Protein: 6 g/dL — ABNORMAL LOW (ref 6.5–8.1)

## 2024-07-22 LAB — CBC WITH DIFFERENTIAL (CANCER CENTER ONLY)
Abs Immature Granulocytes: 0.04 K/uL (ref 0.00–0.07)
Basophils Absolute: 0 K/uL (ref 0.0–0.1)
Basophils Relative: 1 %
Eosinophils Absolute: 0.2 K/uL (ref 0.0–0.5)
Eosinophils Relative: 3 %
HCT: 39.6 % (ref 39.0–52.0)
Hemoglobin: 13.4 g/dL (ref 13.0–17.0)
Immature Granulocytes: 1 %
Lymphocytes Relative: 23 %
Lymphs Abs: 1.5 K/uL (ref 0.7–4.0)
MCH: 31.2 pg (ref 26.0–34.0)
MCHC: 33.8 g/dL (ref 30.0–36.0)
MCV: 92.1 fL (ref 80.0–100.0)
Monocytes Absolute: 0.6 K/uL (ref 0.1–1.0)
Monocytes Relative: 9 %
Neutro Abs: 4.2 K/uL (ref 1.7–7.7)
Neutrophils Relative %: 63 %
Platelet Count: 207 K/uL (ref 150–400)
RBC: 4.3 MIL/uL (ref 4.22–5.81)
RDW: 12.6 % (ref 11.5–15.5)
WBC Count: 6.6 K/uL (ref 4.0–10.5)
nRBC: 0 % (ref 0.0–0.2)

## 2024-07-22 LAB — GENETIC SCREENING ORDER

## 2024-07-22 MED ORDER — GABAPENTIN 100 MG PO CAPS
ORAL_CAPSULE | ORAL | 1 refills | Status: AC
Start: 2024-07-22 — End: ?

## 2024-07-22 MED ORDER — METFORMIN HCL ER 500 MG PO TB24: 500.0000 mg | ORAL_TABLET | Freq: Two times a day (BID) | ORAL | 1 refills | Status: AC

## 2024-07-22 NOTE — Progress Notes (Signed)
 [facility] CONSULT NOTE  Patient Care Team: Sharma Coyer, MD as PCP - General (Family Medicine) Maurie Rayfield BIRCH, RN as Oncology Nurse Navigator Rennie Cindy SAUNDERS, MD as Consulting Physician (Oncology) Isaiah Scrivener, MD as Consulting Physician (Pulmonary Disease)  CHIEF COMPLAINTS/PURPOSE OF CONSULTATION: Colon cancer  Oncology History Overview Note     Colon cancer, status post a partial left colectomy 09/04/2023, stage IIIa (pT2 pN1b) CT abdomen/pelvis 03/01/2023-prostate/pelvic abscess, large amount of colonic stool CT chest 03/30/2023-bilateral solid pulmonary nodules-likely benign, 59-month follow-up CT recommended Colonoscopy 04/12/2023-multiple polyps removed, greater than 50 mm polyp in the distal ascending colon and sigmoid colon not removed Colonoscopy 06/29/2023 greater than 50 mm polyps removed from the ascending, transverse, and descending colon-tubular adenomas, sigmoid lesion at 35 cm incompletely resected with biopsy revealing an ulcerated tubular adenoma with at least high-grade dysplasia and suspicious for invasive or [place] Descending colon resection 09/04/2023, pT2, PN 1B moderately differentiated adenocarcinoma, negative resection margins, lymphovascular invasion present, 3/11 lymph nodes, 2 tumor deposits, mismatch repair protein expression intact, MSS  Multiple colon polyps on colonoscopy 04/12/2023 and 06/29/2023-tubular adenomas Prostate cancer-Gleason 7 acinar adenocarcinoma involving 5-10% of submitted tissue from a TUR specimen 03/03/2023 Prostate 03/20/2023-mild prostamegaly- PI-RADS category 5 lesion in the left peripheral zone and category 4 lesion in the left anterior peripheral zone and left anterior fibromuscular stroma, reduced size of prostate abscess with continued surrounding prostatitis and periprostatic stranding IMRT 20 6/24 - 07/20/2023  4.  Diabetes 5.  History of colon cancer-negative CancerNext expanded panel 08/29/2023 6.   History of lung nodules, ongoing tobacco use, followed in the lung cancer screening clinic# prostate/pelvic abscess.  He was admitted for antibiotics and drainage of the pelvic abscess 03/01/2023. A TUR and was found to have a Gleason 7 prostate cancer involving 5-10% of the submitted tissue.  He was referred to Dr. Lenn and completed external beam radiation to the prostate.  # MAY 2024 [screening colo]- A greater than 50 mm polyp was removed from the proximal ascending colon.  Additional greater than 50 m polyps were not removed from the distal ascending and sigmoid colon.  Additional smaller polyps were removed from the colon. The pathology revealed multiple fragments of tubular adenomas. Dr. Marijean than 50 mm polyps were removed from the ascending, transverse, and descending colon.  A polypoid lesion at 35 cm was incompletely resected and tattooed.  The pathology from multiple polyps returned as tubular adenomas without high-grade dysplasia or carcinoma.  # OCTOBER 2024- STAGE III SIGMOID COLON CANCER [Dr.Sherril/Dr.White- [place]] pathology from the sigmoid resection returned as invasive moderately differentiated adenocarcinoma involving the descending colon.  Carcinoma invades into but not through the muscularis propria.  The resection margins are negative.  Lymphovascular invasion is present.  Metastatic carcinoma was identified in 3 of 11 lymph nodes and there were 2 tumor deposits.  Mismatch repair protein expression is intact.   # Multiple colon polyps on colonoscopy 04/12/2023 and 06/29/2023-tubular adenomas- s/p  Genetics counseling: negative CancerNext expanded panel 08/29/2023  # NOV 4th, 2024- FOLFOX q 2 w x 6 months- # OCT 2024-Colon cancer, status post a partial left colectomy 09/04/2023, stage IIIa (pT2 pN1b)-negative resection margins, lymphovascular invasion present, 3/11 lymph nodes, 2 tumor deposits. [Drs.Sherrill; White;Masarouty]. mismatch repair protein expression intact.  FOLFOX chemotherapy is given every 2 weeks. Awaiting colonoscopy < 1 year.  #  s/p  FOLFOX # 11 of planned 12-discontinued because of poor tolerance/COPD exacerbation. Last FOLFOX on 03/18/2024.    # APRIL 2024-  Prostate cancer [s/p EBRT- Dr.Chrystal]-    Cancer of descending colon (HCC)  09/19/2023 Initial Diagnosis   Cancer of descending colon (HCC)   09/19/2023 Cancer Staging   Staging form: Colon and Rectum, AJCC 8th Edition - Pathologic: pT2, cM0 - Signed by Cloretta Arley NOVAK, MD on 09/19/2023 Histologic grading system: 4 grade system Histologic grade (G): G2 Residual tumor (R): R0 - None Laterality: Left Lymph-vascular invasion (LVI): LVI present/identified, NOS Tumor deposits (TD): Present Perineural invasion (PNI): Absent Microsatellite instability (MSI): Stable   09/19/2023 Cancer Staging   Staging form: Colon and Rectum, AJCC 8th Edition - Pathologic: Stage IIIA (pT2, pN1b, cM0) - Signed by Cloretta Arley NOVAK, MD on 09/19/2023   10/11/2023 -  Chemotherapy   Patient is on Treatment Plan : COLORECTAL FOLFOX q14d x 6 months      HISTORY OF PRESENTING ILLNESS: Patient ambulating-independently.  Alone.  Colin Griffin 73 y.o.  male pleasant patient with  stage III colon cancer s/p left hemicolectomy currently s/p adjuvant chemotherapy with FOLFOX; COPD- active smoker is here for a follow up- and review the results of the CT CAP.   Colonoscopy 06/25/24.    Acupuncture hasn't helped with the tingling in his fingers and toes.  NO falls. Denies any blood in stools or black-colored stools.  No nausea no vomiting.   Review of Systems  Constitutional:  Negative for chills, diaphoresis, fever, malaise/fatigue and weight loss.  HENT:  Negative for nosebleeds and sore throat.   Eyes:  Negative for double vision.  Respiratory:  Negative for hemoptysis, sputum production and wheezing.   Cardiovascular:  Negative for chest pain, palpitations, orthopnea and leg swelling.   Gastrointestinal:  Negative for abdominal pain, blood in stool, constipation, diarrhea, heartburn, melena and vomiting.  Genitourinary:  Negative for dysuria, frequency and urgency.  Musculoskeletal:  Negative for back pain and joint pain.  Skin: Negative.  Negative for itching and rash.  Neurological:  Positive for tingling. Negative for dizziness, focal weakness, weakness and headaches.  Endo/Heme/Allergies:  Does not bruise/bleed easily.  Psychiatric/Behavioral:  Negative for depression. The patient is not nervous/anxious and does not have insomnia.     MEDICAL HISTORY:  Past Medical History:  Diagnosis Date   Cancer [facility]) April 2024   Prostates and colon   Constipation 03/04/2023   COPD (chronic obstructive pulmonary disease) [facility]) May 2025   Diabetes mellitus without complication [facility]) March 2024   Emphysema of lung [facility]) May 2025    SURGICAL HISTORY: Past Surgical History:  Procedure Laterality Date   APPENDECTOMY     BIOPSY  06/29/2023   Procedure: BIOPSY;  Surgeon: Wilhelmenia Aloha Raddle., MD;  Location: [facility];  Service: Gastroenterology;;   COLONOSCOPY N/A 06/25/2024   Procedure: COLONOSCOPY;  Surgeon: Wilhelmenia Aloha Raddle., MD;  Location: [facility];  Service: Gastroenterology;  Laterality: N/A;   COLONOSCOPY WITH PROPOFOL  N/A 04/12/2023   Procedure: COLONOSCOPY WITH PROPOFOL ;  Surgeon: Unk Corinn Skiff, MD;  Location: [facility];  Service: Gastroenterology;  Laterality: N/A;   COLONOSCOPY WITH PROPOFOL  N/A 06/29/2023   Procedure: COLONOSCOPY WITH PROPOFOL ;  Surgeon: Wilhelmenia Aloha Raddle., MD;  Location: [facility];  Service: Gastroenterology;  Laterality: N/A;   ENDOSCOPIC MUCOSAL RESECTION N/A 06/29/2023   Procedure: ENDOSCOPIC MUCOSAL RESECTION;  Surgeon: Wilhelmenia Aloha Raddle., MD;  Location: [facility];  Service: Gastroenterology;  Laterality: N/A;   ENDOSCOPIC MUCOSAL RESECTION N/A 06/25/2024   Procedure: RESECTION, MUCOSAL LESION, GI TRACT,  ENDOSCOPIC;  Surgeon: Mansouraty, Aloha Raddle., MD;  Location: [facility];  Service: Gastroenterology;  Laterality: N/A;   FLEXIBLE SIGMOIDOSCOPY N/A 09/04/2023   Procedure: FLEXIBLE SIGMOIDOSCOPY;  Surgeon: Teresa Lonni HERO, MD;  Location: [facility];  Service: General;  Laterality: N/A;   HEMOSTASIS CONTROL  06/29/2023   Procedure: HEMOSTASIS CONTROL;  Surgeon: Wilhelmenia Aloha Raddle., MD;  Location: [facility];  Service: Gastroenterology;;   IR IMAGING GUIDED PORT INSERTION  09/26/2023   IR RADIOLOGIST EVAL & MGMT  03/16/2023   PELVIC ABCESS DRAINAGE     March 2024   POLYPECTOMY  06/29/2023   Procedure: POLYPECTOMY;  Surgeon: Mansouraty, Aloha Raddle., MD;  Location: [facility];  Service: Gastroenterology;;   ROBLEY LIFTING INJECTION  06/29/2023   Procedure: SUBMUCOSAL LIFTING INJECTION;  Surgeon: Wilhelmenia Aloha Raddle., MD;  Location: [facility];  Service: Gastroenterology;;   SUBMUCOSAL TATTOO INJECTION  06/29/2023   Procedure: SUBMUCOSAL TATTOO INJECTION;  Surgeon: Wilhelmenia Aloha Raddle., MD;  Location: [facility];  Service: Gastroenterology;;   TRANSURETHRAL RESECTION OF PROSTATE N/A 03/03/2023   Procedure: TRANSURETHRAL RESECTION OF THE PROSTATE (TURP)/ UNROOFING OF PROSTATE ABSCESS;  Surgeon: Twylla Glendia BROCKS, MD;  Location: [facility];  Service: Urology;  Laterality: N/A;    SOCIAL HISTORY: Social History   Socioeconomic History   Marital status: Married    Spouse name: Editor, commissioning   Number of children: 1   Years of education: Not on file   Highest education level: Master's degree (e.g., MA, MS, MEng, MEd, MSW, MBA)  Occupational History   Not on file  Tobacco Use   Smoking status: Every Day    Current packs/day: 0.50    Average packs/day: 1.5 packs/day for 54.6 years (81.5 ttl pk-yrs)    Types: Cigarettes    Start date: 12   Smokeless tobacco: Never   Tobacco comments:    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        Has  cut down to half a pack and smoking half of the cigarette a day 04/17/24  Vaping Use   Vaping status: Former   Start date: 08/08/2022   Substances: Flavoring  Substance and Sexual Activity   Alcohol use: Not Currently   Drug use: Never   Sexual activity: Not Currently    Birth control/protection: None  Other Topics Concern   Not on file  Social History Narrative   ** Merged History Encounter **       Retired from Holiday representative for 30 years    Social Drivers of Corporate investment banker Strain: Low Risk  (05/29/2024)   Overall Financial Resource Strain (CARDIA)    Difficulty of Paying Living Expenses: Not hard at all  Food Insecurity: No Food Insecurity (05/29/2024)   Hunger Vital Sign    Worried About Running Out of Food in the Last Year: Never true    Ran Out of Food in the Last Year: Never true  Transportation Needs: No Transportation Needs (05/29/2024)   PRAPARE - Administrator, Civil Service (Medical): No    Lack of Transportation (Non-Medical): No  Physical Activity: Sufficiently Active (05/29/2024)   Exercise Vital Sign    Days of Exercise per Week: 3 days    Minutes of Exercise per Session: 60 min  Stress: No Stress Concern Present (05/29/2024)   [employer] of Occupational Health - Occupational Stress Questionnaire    Feeling of Stress: Not at all  Social Connections: Socially Integrated (05/29/2024)   Social Connection and Isolation Panel  Frequency of Communication with Friends and Family: More than three times a week    Frequency of Social Gatherings with Friends and Family: More than three times a week    Attends Religious Services: More than 4 times per year    Active Member of [employer] or Organizations: Yes    Attends Engineer, structural: More than 4 times per year    Marital Status: Married  Catering manager Violence: Not At Risk (05/29/2024)   Humiliation, Afraid, Rape, and Kick questionnaire    Fear of Current or Ex-Partner: No     Emotionally Abused: No    Physically Abused: No    Sexually Abused: No    FAMILY HISTORY: Family History  Problem Relation Age of Onset   Atrial fibrillation Mother    Skin cancer Father        non-melanoma   Colon cancer Sister 26       Lynch Syndrome   Breast cancer Sister 18   Diabetes Brother    Throat cancer Maternal Uncle    Breast cancer Paternal Aunt 11 - 36   Colon cancer Maternal Grandmother 76   Cancer Maternal Grandmother    Alcohol abuse Maternal Grandfather    Breast cancer Paternal Grandmother 102   Prostate cancer Paternal Grandfather 61 - 63   Other Niece        2 nieces have Lynch Syndrome (brother's daughters)   Colon cancer Other    Diabetes Other    Ovarian cancer Other 18       paternal first cousin once removed   Colon polyps Neg Hx    Stomach cancer Neg Hx    Esophageal cancer Neg Hx    Inflammatory bowel disease Neg Hx    Liver disease Neg Hx    Pancreatic cancer Neg Hx    Rectal cancer Neg Hx     ALLERGIES:  has no known allergies.  MEDICATIONS:  Current Outpatient Medications  Medication Sig Dispense Refill   albuterol  (VENTOLIN  HFA) 108 (90 Base) MCG/ACT inhaler TAKE 2 PUFFS BY MOUTH EVERY 6 HOURS AS NEEDED FOR WHEEZE OR SHORTNESS OF BREATH 18 each 2   Continuous Glucose Sensor (FREESTYLE LIBRE 3 SENSOR) MISC PLACE 1 SENSOR ON THE SKIN EVERY 14 DAYS. USE TO CHECK GLUCOSE CONTINUOUSLY 2 each 6   Dulaglutide  (TRULICITY ) 0.75 MG/0.5ML SOAJ INJECT 0.75 MG SUBCUTANEOUSLY ONE TIME PER WEEK 6 mL 3   fluticasone  (FLONASE ) 50 MCG/ACT nasal spray Place 2 sprays into both nostrils daily. 1 g 10   Fluticasone -Umeclidin-Vilant (TRELEGY ELLIPTA ) 200-62.5-25 MCG/ACT AEPB Inhale 1 Act into the lungs daily. 1 each 6   gabapentin  (NEURONTIN ) 100 MG capsule Take 1 pill at nighttime for 7 days- if tolerating well you can go up to 2  pills at night 90 capsule 1   lidocaine -prilocaine  (EMLA ) cream Apply 1 Application topically as needed. 30 g 1   roflumilast   (DALIRESP ) 500 MCG TABS tablet Take 1 tablet (500 mcg total) by mouth daily. 30 tablet 10   metFORMIN  (GLUCOPHAGE -XR) 500 MG 24 hr tablet Take 1 tablet (500 mg total) by mouth 2 (two) times daily with a meal. 180 tablet 1   No current facility-administered medications for this visit.    PHYSICAL EXAMINATION:   Vitals:   07/22/24 1421 07/22/24 1432  BP: (!) 122/97 111/85  Pulse: (!) 101   Resp: 20   Temp: 98.2 F (36.8 C)   SpO2: 99%    Filed Weights   07/22/24 1421  Weight: 168 lb 8 oz (76.4 kg)    Physical Exam Vitals and nursing note reviewed.  HENT:     Head: Normocephalic and atraumatic.     Mouth/Throat:     Pharynx: Oropharynx is clear.  Eyes:     Extraocular Movements: Extraocular movements intact.     Pupils: Pupils are equal, round, and reactive to light.  Cardiovascular:     Rate and Rhythm: Normal rate and regular rhythm.  Pulmonary:     Comments: Decreased breath sounds bilaterally.  Abdominal:     Palpations: Abdomen is soft.  Musculoskeletal:        General: Normal range of motion.     Cervical back: Normal range of motion.  Skin:    General: Skin is warm.  Neurological:     General: No focal deficit present.     Mental Status: He is alert and oriented to person, place, and time.  Psychiatric:        Behavior: Behavior normal.        Judgment: Judgment normal.     LABORATORY DATA:  I have reviewed the data as listed Lab Results  Component Value Date   WBC 6.6 07/22/2024   HGB 13.4 07/22/2024   HCT 39.6 07/22/2024   MCV 92.1 07/22/2024   PLT 207 07/22/2024   Recent Labs    04/09/24 1059 05/21/24 1241 07/22/24 1411  NA 133* 133* 133*  K 3.7 3.9 3.3*  CL 102 104 111  CO2 22 21* 16*  GLUCOSE 209* 234* 135*  BUN 29* 11 12  CREATININE 0.97 1.04 0.84  CALCIUM  8.8* 9.0 8.0*  GFRNONAA >60 >60 >60  PROT 7.5 7.2 6.0*  ALBUMIN 3.3* 3.9 3.4*  AST 16 21 15   ALT 21 18 11   ALKPHOS 97 53 58  BILITOT 0.6 0.6 0.4    RADIOGRAPHIC  STUDIES: I have personally reviewed the radiological images as listed and agreed with the findings in the report. No results found.   Cancer of descending colon (HCC) # OCT 2024-Colon cancer, status post a partial left colectomy 09/04/2023, stage IIIa (pT2 pN1b)-negative resection margins, lymphovascular invasion present, 3/11 lymph nodes, 2 tumor deposits. [Drs.Sherrill; White;Masarouty]. mismatch repair protein expression intact. FOLFOX chemotherapy is given every 2 weeks. Awaiting colonoscopy < 1 year.  #  s/p  FOLFOX # 11 of planned 12-discontinued because of poor tolerance/COPD exacerbation. Last FOLFOX on 03/18/2024.   # JUNE 2025-  Signatera- negative-; CT scan- JULY 2025- NED.  Continue checking Signatera as per protocol.  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   # COPD/ albuterol  prn; s/p steroids [Dr.Kasa] - stable.   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 Stable.   # Diabetes-FBG 143- Trulicity /metformin - CGM-   # Acitive smoker: Recommend quitting smoking- currently 2 cig/day- .  Continue lung cancer screening-   # IV access port: # port/IV access-  discussed re: pro and cons of keeping the port vs. Explantation. Pt in agreement for port explantation-  Referral to IR re: mediport explantation.   # DISPOSITION:  # Referral to IR re: mediport explantation. # follow up in 3 months MD;  labs-- cbc/cmp; [facility];  Dr.B  # I reviewed the blood work- with the patient in detail; also reviewed the imaging independently [as summarized above]; and with the patient in detail.     Above plan  of care was discussed with patient/family in detail.  My contact information was given to the patient/family.     Cindy JONELLE Joe, MD 07/22/2024 4:04 PM

## 2024-07-22 NOTE — Assessment & Plan Note (Addendum)
#   OCT 2024-Colon cancer, status post a partial left colectomy 09/04/2023, stage IIIa (pT2 pN1b)-negative resection margins, lymphovascular invasion present, 3/11 lymph nodes, 2 tumor deposits. [Drs.Sherrill; White;Masarouty]. mismatch repair protein expression intact. FOLFOX chemotherapy is given every 2 weeks. Awaiting colonoscopy < 1 year.  #  s/p  FOLFOX # 11 of planned 12-discontinued because of poor tolerance/COPD exacerbation. Last FOLFOX on 03/18/2024.   # JUNE 2025-  Signatera- negative-; CT scan- JULY 2025- NED.  Continue checking Signatera as per protocol.  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   # COPD/ albuterol  prn; s/p steroids [Dr.Kasa] - stable.   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 Stable.   # Diabetes-FBG 143- Trulicity /metformin - CGM-   # Acitive smoker: Recommend quitting smoking- currently 2 cig/day- .  Continue lung cancer screening-   # IV access port: # port/IV access-  discussed re: pro and cons of keeping the port vs. Explantation. Pt in agreement for port explantation-  Referral to IR re: mediport explantation.   # DISPOSITION:  # Referral to IR re: mediport explantation. # follow up in 3 months MD;  labs-- [facility]; [facility];  Dr.B  # I reviewed the blood work- with the patient in detail; also reviewed the imaging independently [as summarized above]; and with the patient in detail.

## 2024-07-22 NOTE — Progress Notes (Signed)
 Needs refill of metformin , pended.  CT abd/pelvis 05/28/24.  Colonoscopy 06/25/24.  Acupuncture hasn't helped with the tingling in his fingers and toes and states you had suggested a rx at one time.

## 2024-07-22 NOTE — Progress Notes (Signed)
 Signatera labs drawn. Kit packaged and sent via [employer]

## 2024-07-23 LAB — CEA: CEA: 5.4 ng/mL — ABNORMAL HIGH (ref 0.0–4.7)

## 2024-07-26 ENCOUNTER — Encounter: Payer: Self-pay | Admitting: Internal Medicine

## 2024-08-01 LAB — SIGNATERA
SIGNATERA MTM READOUT: 0 MTM/ml
SIGNATERA TEST RESULT: NEGATIVE

## 2024-08-09 ENCOUNTER — Other Ambulatory Visit: Payer: Self-pay | Admitting: Radiology

## 2024-08-09 ENCOUNTER — Telehealth: Payer: Self-pay | Admitting: *Deleted

## 2024-08-09 NOTE — Telephone Encounter (Signed)
 Pt has an appt to have his port removed on Monday 9/15 with arrival time of 12:30 pm. [facility] He does not need a driver.

## 2024-08-12 ENCOUNTER — Ambulatory Visit
Admission: RE | Admit: 2024-08-12 | Discharge: 2024-08-12 | Disposition: A | Discharge status: Home / Self Care | Source: Ambulatory Visit | Attending: Internal Medicine | Admitting: Internal Medicine

## 2024-08-12 DIAGNOSIS — C186 Malignant neoplasm of descending colon: Secondary | ICD-10-CM | POA: Insufficient documentation

## 2024-08-12 HISTORY — PX: IR REMOVAL TUN ACCESS W/ PORT W/O FL MOD SED: IMG2290

## 2024-08-12 MED ORDER — LIDOCAINE-EPINEPHRINE 1 %-1:100000 IJ SOLN
8.0000 mL | Freq: Once | INTRAMUSCULAR | Status: AC
  Administered 2024-08-12: 8 mL via INTRADERMAL

## 2024-08-12 MED ORDER — LIDOCAINE-EPINEPHRINE 1 %-1:100000 IJ SOLN
INTRAMUSCULAR | Status: AC
  Filled 2024-08-12: qty 1

## 2024-08-12 NOTE — Procedures (Signed)
 Interventional Radiology Procedure Note  Procedure: rt internal jugular power port removal     Complications: None  Estimated Blood Loss:  min  Findings: Full report in pacs     EMERSON FREDERIC SPECKING, MD

## 2024-08-13 ENCOUNTER — Telehealth: Payer: Self-pay | Admitting: Internal Medicine

## 2024-08-13 NOTE — Telephone Encounter (Signed)
 Dr. Isaiah placed an order for Smartvest on 05/29/24. I just spoke with Izetta with [employer] she stated they still need the referral Dx to also state bronchiectasis Can a new order for Smart vest be placed with the added Dx code

## 2024-08-13 NOTE — Addendum Note (Signed)
 Addended by: Devyn Sheerin on: 08/13/2024 04:03 PM   Modules accepted: Orders

## 2024-08-16 NOTE — Telephone Encounter (Signed)
 Noted, NFN

## 2024-09-11 ENCOUNTER — Ambulatory Visit: Admitting: Internal Medicine

## 2024-09-16 ENCOUNTER — Encounter: Payer: Self-pay | Admitting: Hospice and Palliative Medicine

## 2024-09-17 ENCOUNTER — Encounter: Payer: Self-pay | Admitting: Physician Assistant

## 2024-09-17 ENCOUNTER — Telehealth: Payer: Self-pay | Admitting: Family Medicine

## 2024-09-17 ENCOUNTER — Ambulatory Visit: Payer: Self-pay

## 2024-09-17 ENCOUNTER — Ambulatory Visit: Admitting: Physician Assistant

## 2024-09-17 VITALS — BP 104/73 | HR 99 | Resp 16 | Ht 73.0 in | Wt 170.6 lb

## 2024-09-17 DIAGNOSIS — Z9221 Personal history of antineoplastic chemotherapy: Secondary | ICD-10-CM

## 2024-09-17 DIAGNOSIS — J018 Other acute sinusitis: Secondary | ICD-10-CM

## 2024-09-17 DIAGNOSIS — J449 Chronic obstructive pulmonary disease, unspecified: Secondary | ICD-10-CM

## 2024-09-17 DIAGNOSIS — Z7984 Long term (current) use of oral hypoglycemic drugs: Secondary | ICD-10-CM

## 2024-09-17 DIAGNOSIS — E1165 Type 2 diabetes mellitus with hyperglycemia: Secondary | ICD-10-CM

## 2024-09-17 DIAGNOSIS — Z720 Tobacco use: Secondary | ICD-10-CM | POA: Diagnosis not present

## 2024-09-17 MED ORDER — PREDNISONE 20 MG PO TABS: 20.0000 mg | ORAL_TABLET | Freq: Two times a day (BID) | ORAL | 0 refills | Status: DC

## 2024-09-17 MED ORDER — AZITHROMYCIN 250 MG PO TABS: ORAL_TABLET | ORAL | 0 refills | Status: AC

## 2024-09-17 NOTE — Telephone Encounter (Signed)
 Copied from CRM #[id number]. Topic: Clinical - Medical Advice >> Sep 17, 2024 11:12 AM Donee H wrote: Reason for CRM: Patient called stating he has discovered he has a sinus infection. He stated it's been about 3 weeks now. He would like to see if pcp can prescribe him some antibiotics and prednisone .    [id number] - [place], [place] - [address] [place] [address] Phone: [phone] Fax: [phone] Hours: Not open 24 hours  Please follow up with patient on request

## 2024-09-17 NOTE — Telephone Encounter (Signed)
 Unable to convert this telephone encounter to nurse triage encounter from patient calls in-basket. Signing to close encounter. See nurse triage encounter for details of this call.

## 2024-09-17 NOTE — Telephone Encounter (Signed)
 FYI Only or Action Required?: FYI only for provider.  Patient was last seen in primary care on 09/01/2023 by Sharma Coyer, MD.  Called Nurse Triage reporting Sinusitis.  Symptoms began several weeks ago.  Interventions attempted: Rest, hydration, or home remedies.  Symptoms are: unchanged.  Triage Disposition: See PCP When Office is Open (Within 3 Days)  Patient/caregiver understands and will follow disposition?: Yes   Copied from CRM #[id number]. Topic: Clinical - Medical Advice >> Sep 17, 2024 11:12 AM Donee H wrote: Reason for CRM: Patient called stating he has discovered he has a sinus infection. He stated it's been about 3 weeks now. He would like to see if pcp can prescribe him some antibiotics and prednisone .      [id number] [place],  - [address] [place] [address] Phone: [phone] Fax: [phone] Hours: Not open 24 hours   Please follow up with patient on request    Reason for Disposition  [1] Sinus congestion (pressure, fullness) AND [2] present > 10 days  Answer Assessment - Initial Assessment Questions Additional info:  1) Contacted oncologist first they deferred to pcp.    1. LOCATION: Where does it hurt?      Not painful but congested 2. ONSET: When did the sinus pain start?  (e.g., hours, days)      3 weeks ago  3. SEVERITY: How bad is the pain?   (Scale 0-10; or none, mild, moderate or severe)    Not painful, congested-moderate pressure 4. RECURRENT SYMPTOM: Have you ever had sinus problems before? If Yes, ask: When was the last time? and What happened that time?      Yes, usually once per year  5. NASAL CONGESTION: Is the nose blocked? If Yes, ask: Can you open it or must you breathe through your mouth?     yes 6. NASAL DISCHARGE: Do you have discharge from your nose? If so ask, What color?     green 7. FEVER: Do you have a fever? If Yes, ask: What is it, how was it measured, and when  did it start?      denies 8. OTHER SYMPTOMS: Do you have any other symptoms? (e.g., sore throat, cough, earache, difficulty breathing)     Chest congestion, cough  Protocols used: Sinus Pain or Congestion-A-AH

## 2024-09-17 NOTE — Progress Notes (Signed)
 Established patient visit  Patient: Colin Griffin   DOB: 1951/07/03   73 y.o. Male  MRN: [id number] Visit Date: 09/17/2024  Today's healthcare provider: Jolynn Spencer, PA-C   Chief Complaint  Patient presents with   Sinusitis    Nasal, chest congestion, sob onset 2-3 weeks  Had pneumonia 1 month ago   Subjective     History of Present Illness Colin Griffin is a 73 year old male with COPD who presents with sinus pressure, congestion, and shortness of breath.  He has experienced sinus pressure, nasal and chest congestion, and shortness of breath for the past two to three weeks. His sinuses become completely blocked at night, causing drainage and coughing. He typically has sinus infections every fall, often following allergies, and his current symptoms are similar to past infections, which were treated with azithromycin  and prednisone .  He had pneumonia two months ago, treated with medications and breathing aids, which improved his breathing, but sinus issues persist. He completed chemotherapy for colon cancer in July, with the last two treatments causing significant breathing difficulties. Improvement was noted by the end of August after pneumonia treatment.  He manages diabetes with metformin , maintaining blood sugar levels around 120-125 mg/dL. He uses an albuterol  inhaler, Flonase , Trelegy, and Aroflumilast. He smokes 10-15 cigarettes a day and has tried various methods to quit.  He has no fever, muscle pain, exposure to COVID-19 or flu, and no issues with hearing or eye discharge.       07/22/2024    2:27 PM 05/29/2024    8:15 AM 09/19/2023    2:13 PM  Depression screen PHQ 2/9  Decreased Interest 0 0 0  Down, Depressed, Hopeless 0 0 0  PHQ - 2 Score 0 0 0  Altered sleeping  0   Tired, decreased energy  0   Change in appetite  0   Feeling bad or failure about yourself   0   Trouble concentrating  0   Moving slowly or fidgety/restless  0   Suicidal thoughts  0    PHQ-9 Score  0   Difficult doing work/chores  Not difficult at all        No data to display          Medications: Outpatient Medications Prior to Visit  Medication Sig   albuterol  (VENTOLIN  HFA) 108 (90 Base) MCG/ACT inhaler TAKE 2 PUFFS BY MOUTH EVERY 6 HOURS AS NEEDED FOR WHEEZE OR SHORTNESS OF BREATH   Continuous Glucose Sensor (FREESTYLE LIBRE 3 SENSOR) MISC PLACE 1 SENSOR ON THE SKIN EVERY 14 DAYS. USE TO CHECK GLUCOSE CONTINUOUSLY   Dulaglutide  (TRULICITY ) 0.75 MG/0.5ML SOAJ INJECT 0.75 MG SUBCUTANEOUSLY ONE TIME PER WEEK   fluticasone  (FLONASE ) 50 MCG/ACT nasal spray Place 2 sprays into both nostrils daily.   Fluticasone -Umeclidin-Vilant (TRELEGY ELLIPTA ) 200-62.5-25 MCG/ACT AEPB Inhale 1 Act into the lungs daily.   gabapentin  (NEURONTIN ) 100 MG capsule Take 1 pill at nighttime for 7 days- if tolerating well you can go up to 2  pills at night   lidocaine -prilocaine  (EMLA ) cream Apply 1 Application topically as needed.   metFORMIN  (GLUCOPHAGE -XR) 500 MG 24 hr tablet Take 1 tablet (500 mg total) by mouth 2 (two) times daily with a meal.   roflumilast  (DALIRESP ) 500 MCG TABS tablet Take 1 tablet (500 mcg total) by mouth daily.   No facility-administered medications prior to visit.    Review of Systems All negative Except see HPI       Objective  BP 104/73   Pulse 99   Resp 16   Ht 6' 1 (1.854 m)   Wt 170 lb 9.6 oz (77.4 kg)   SpO2 99%   BMI 22.51 kg/m     Physical Exam Vitals reviewed.  Constitutional:      General: He is in acute distress.     Appearance: Normal appearance. He is not ill-appearing, toxic-appearing or diaphoretic.  HENT:     Head: Normocephalic and atraumatic.     Right Ear: Tympanic membrane, ear canal and external ear normal.     Left Ear: Tympanic membrane, ear canal and external ear normal.     Nose: Congestion and rhinorrhea present.     Mouth/Throat:     Pharynx: Posterior oropharyngeal erythema present.  Eyes:     General: No  scleral icterus.       Right eye: No discharge.        Left eye: No discharge.     Extraocular Movements: Extraocular movements intact.     Conjunctiva/sclera: Conjunctivae normal.     Pupils: Pupils are equal, round, and reactive to light.  Cardiovascular:     Rate and Rhythm: Normal rate and regular rhythm.     Pulses: Normal pulses.     Heart sounds: Normal heart sounds. No murmur heard. Pulmonary:     Effort: Pulmonary effort is normal. No respiratory distress.     Breath sounds: Normal breath sounds. No wheezing or rhonchi.  Abdominal:     General: Abdomen is flat. Bowel sounds are normal.     Palpations: Abdomen is soft.  Musculoskeletal:        General: Normal range of motion.     Cervical back: Normal range of motion and neck supple.     Right lower leg: No edema.     Left lower leg: No edema.  Lymphadenopathy:     Cervical: No cervical adenopathy.  Skin:    General: Skin is warm and dry.     Findings: No rash.  Neurological:     General: No focal deficit present.     Mental Status: He is alert and oriented to person, place, and time. Mental status is at baseline.  Psychiatric:        Behavior: Behavior normal.        Thought Content: Thought content normal.      No results found for any visits on 09/17/24.      Assessment & Plan Acute/Subacute sinusitis Symptoms consistent with acute sinusitis, ongoing for two to three weeks. History of sinus infections every fall, typically following allergies. Previous treatments with antibiotics and prednisone  have been effective. - Prescribe azithromycin  for five days, stop after three days if symptoms improve. - Prescribe prednisone , two tablets daily for five days, concurrent with azithromycin . - Advise to contact pulmonologist if symptoms do not improve within five days. - Recommend scheduling an appointment with primary care physician if symptoms persist.  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 Albuterol  inhaler used for acute symptoms, not effective for sinus-related symptoms. Lung examination unremarkable. - Continue Trelegy and roflumilast  and albuterol . - Use albuterol  inhaler Q 4 hours as needed for acute symptoms.  Type 2 diabetes mellitus Chronic and stable Diabetes well-controlled with average blood sugar level of 120-125 mg/dL. Aware prednisone  can increase blood sugar levels. - Monitor blood sugar levels closely while taking prednisone . Continue taking dulaglutide  0.75 and metformin  500mg  Continue  lifestyle modifications Will follow-up with pcp  Tobacco use Smokes 10-15 cigarettes per day. Previous cessation attempts with patches, hypnotism, and acupuncture. Smoking cessation important for COPD management and overall health. Cessation advised Follow-Up Advised to follow up with primary care physician for chronic condition management and continuity of care. Contact pulmonologist if sinusitis symptoms do not improve with treatment. - Schedule appointment with primary care physician within one to two weeks.   Other subacute sinusitis (Primary)  - azithromycin  (ZITHROMAX ) 250 MG tablet; Take 2 tablets on day 1, then 1 tablet daily on days 2 through 5  Dispense: 6 tablet; Refill: 0 - predniSONE  (DELTASONE ) 20 MG tablet; Take 1 tablet (20 mg total) by mouth 2 (two) times daily with a meal.  Dispense: 10 tablet; Refill: 0  History of cancer chemotherapy For colon cancer Completed in July    No orders of the defined types were placed in this encounter.   No follow-ups on file.   The patient was advised to call back or seek an in-person evaluation if the symptoms worsen or if the condition fails to improve as anticipated.  I discussed the assessment and treatment plan with the patient. The patient was provided an opportunity to ask questions and all were answered. The patient agreed with  the plan and demonstrated an understanding of the instructions.  I, Ruqayya Ventress, PA-C have reviewed all documentation for this visit. The documentation on 09/17/2024  for the exam, diagnosis, procedures, and orders are all accurate and complete.  Jolynn Spencer, [facility], MMS [facility] [phone] (phone) [phone] (fax)  [facility]

## 2024-09-20 ENCOUNTER — Telehealth: Payer: Self-pay | Admitting: Internal Medicine

## 2024-09-20 NOTE — Telephone Encounter (Signed)
 We received a note from [employer]. The [employer] order is being CXL due to patient not wanting to move forward at this time.

## 2024-09-25 ENCOUNTER — Ambulatory Visit: Admitting: Family Medicine

## 2024-09-25 ENCOUNTER — Ambulatory Visit: Payer: Self-pay | Admitting: Family Medicine

## 2024-09-25 VITALS — BP 125/83 | HR 69 | Temp 98.4°F | Ht 73.0 in | Wt 167.5 lb

## 2024-09-25 DIAGNOSIS — Z87891 Personal history of nicotine dependence: Secondary | ICD-10-CM

## 2024-09-25 DIAGNOSIS — Z9049 Acquired absence of other specified parts of digestive tract: Secondary | ICD-10-CM | POA: Diagnosis not present

## 2024-09-25 DIAGNOSIS — E1165 Type 2 diabetes mellitus with hyperglycemia: Secondary | ICD-10-CM

## 2024-09-25 DIAGNOSIS — J018 Other acute sinusitis: Secondary | ICD-10-CM | POA: Diagnosis not present

## 2024-09-25 DIAGNOSIS — J439 Emphysema, unspecified: Secondary | ICD-10-CM

## 2024-09-25 DIAGNOSIS — Z85038 Personal history of other malignant neoplasm of large intestine: Secondary | ICD-10-CM

## 2024-09-25 DIAGNOSIS — Z7985 Long-term (current) use of injectable non-insulin antidiabetic drugs: Secondary | ICD-10-CM

## 2024-09-25 DIAGNOSIS — Z7984 Long term (current) use of oral hypoglycemic drugs: Secondary | ICD-10-CM

## 2024-09-25 DIAGNOSIS — R0982 Postnasal drip: Secondary | ICD-10-CM

## 2024-09-25 DIAGNOSIS — Z720 Tobacco use: Secondary | ICD-10-CM

## 2024-09-25 LAB — POCT GLYCOSYLATED HEMOGLOBIN (HGB A1C): Hemoglobin A1C: 7.1 % — AB (ref 4.0–5.6)

## 2024-09-25 MED ORDER — METHYLPREDNISOLONE ACETATE 40 MG/ML IJ SUSP
40.0000 mg | Freq: Once | INTRAMUSCULAR | Status: AC
  Administered 2024-09-25: 40 mg via INTRAMUSCULAR

## 2024-09-25 MED ORDER — PREDNISONE 20 MG PO TABS: 20.0000 mg | ORAL_TABLET | Freq: Two times a day (BID) | ORAL | 0 refills | Status: AC

## 2024-09-25 NOTE — Assessment & Plan Note (Signed)
 Type 2 diabetes mellitus with hyperglycemia Chronic type 2 diabetes mellitus with recent A1c of 7.1, acceptable given recent health challenges. Continues on Trulicity  and metformin  with good glucose monitoring using Freestyle Libre. - Continue Trulicity  0.75 mg weekly - Continue metformin  500 mg daily - Continue Freestyle Libre for glucose monitoring - Ordered urine albumin test - Performed diabetes foot exam - Recommended diabetes eye exam

## 2024-09-25 NOTE — Progress Notes (Signed)
 Established patient visit   Patient: Colin Griffin   DOB: 03-11-1951   73 y.o. Male  MRN: [id number] Visit Date: 09/25/2024  Today's healthcare provider: Rockie Agent, MD   Chief Complaint  Patient presents with   Medical Management of Chronic Issues    1 week f/u for sinusitis, patient reports feeling about the same as last visit. Reports that nasal drainage has been constant since July. Mucus collects in throat and patient coughs, is able to get some up. Finished abx and prednisone     Sinusitis   Subjective     HPI     Medical Management of Chronic Issues    Additional comments: 1 week f/u for sinusitis, patient reports feeling about the same as last visit. Reports that nasal drainage has been constant since July. Mucus collects in throat and patient coughs, is able to get some up. Finished abx and prednisone        Last edited by Cherry Chiquita HERO, CMA on 09/25/2024  1:42 PM.       Discussed the use of AI scribe software for clinical note transcription with the patient, who gave verbal consent to proceed.  History of Present Illness Colin Griffin is a 73 year old male with type 2 diabetes who presents for a follow-up visit.  He continues to manage his type 2 diabetes with Trulicity  0.75 mg weekly and metformin  500 mg daily. He uses [employer] for glucose monitoring, and his recent A1c was 7.1%.  He has ongoing sinusitis, initially treated with antibiotics and prednisone  about a week ago. Despite treatment, he experiences persistent nasal drainage since July, with mucus collecting in his throat, causing a cough that worsens at night. The cough is productive of a small amount of phlegm. He has tried various cough syrups and popsicles for relief. He reports episodes of urinary leakage that occur with coughing. He uses Flonase  daily without significant improvement and takes Claritin D every other day with variable relief. No current facial pressure, but  ongoing drainage is present.  He has a history of colon cancer, status post partial colectomy, and prostate cancer, with regular follow-ups with urology and oncology. He has a significant tobacco use history, with greater than fifty pack years, and is scheduled for a low dose CT for lung cancer screening.  He reports a history of pneumonia, managed with hot baths and coughing techniques. He experiences neuropathy in his feet, with numbness and cold sensations, which has led to self-injury while cutting toenails.  His family history includes his father, who passed away after a rapid decline in health, experiencing severe breathing issues and significant weight loss.  Completed chemotherapy for colon cancer in April, onc f/u in Nov   Follows with pulm for COPD   Port has been removed     Past Medical History:  Diagnosis Date   Cancer [facility]) April 2024   Prostates and colon   Constipation 03/04/2023   COPD (chronic obstructive pulmonary disease) (HCC) May 2025   Diabetes mellitus without complication (HCC) March 2024   Emphysema of lung [facility]) May 2025    Medications: Outpatient Medications Prior to Visit  Medication Sig   albuterol  (VENTOLIN  HFA) 108 (90 Base) MCG/ACT inhaler TAKE 2 PUFFS BY MOUTH EVERY 6 HOURS AS NEEDED FOR WHEEZE OR SHORTNESS OF BREATH   Continuous Glucose Sensor (FREESTYLE LIBRE 3 SENSOR) MISC PLACE 1 SENSOR ON THE SKIN EVERY 14 DAYS. USE TO CHECK GLUCOSE CONTINUOUSLY   Dulaglutide  (TRULICITY )  0.75 MG/0.5ML SOAJ INJECT 0.75 MG SUBCUTANEOUSLY ONE TIME PER WEEK   fluticasone  (FLONASE ) 50 MCG/ACT nasal spray Place 2 sprays into both nostrils daily.   Fluticasone -Umeclidin-Vilant (TRELEGY ELLIPTA ) 200-62.5-25 MCG/ACT AEPB Inhale 1 Act into the lungs daily.   gabapentin  (NEURONTIN ) 100 MG capsule Take 1 pill at nighttime for 7 days- if tolerating well you can go up to 2  pills at night   lidocaine -prilocaine  (EMLA ) cream Apply 1 Application topically as needed.    metFORMIN  (GLUCOPHAGE -XR) 500 MG 24 hr tablet Take 1 tablet (500 mg total) by mouth 2 (two) times daily with a meal.   roflumilast  (DALIRESP ) 500 MCG TABS tablet Take 1 tablet (500 mcg total) by mouth daily.   [DISCONTINUED] predniSONE  (DELTASONE ) 20 MG tablet Take 1 tablet (20 mg total) by mouth 2 (two) times daily with a meal.   No facility-administered medications prior to visit.    Review of Systems  Last metabolic panel Lab Results  Component Value Date   GLUCOSE 135 (H) 07/22/2024   NA 133 (L) 07/22/2024   K 3.3 (L) 07/22/2024   CL 111 07/22/2024   CO2 16 (L) 07/22/2024   BUN 12 07/22/2024   CREATININE 0.84 07/22/2024   GFRNONAA >60 07/22/2024   CALCIUM  8.0 (L) 07/22/2024   PROT 6.0 (L) 07/22/2024   ALBUMIN 3.4 (L) 07/22/2024   LABGLOB 3.0 02/14/2023   AGRATIO 1.4 02/14/2023   BILITOT 0.4 07/22/2024   ALKPHOS 58 07/22/2024   AST 15 07/22/2024   ALT 11 07/22/2024   ANIONGAP 6 07/22/2024        Objective    BP 125/83 (BP Location: Right Arm, Patient Position: Sitting, Cuff Size: Normal)   Pulse 69   Temp 98.4 F (36.9 C) (Oral)   Ht 6' 1 (1.854 m)   Wt 167 lb 8 oz (76 kg)   SpO2 100%   BMI 22.10 kg/m  BP Readings from Last 3 Encounters:  09/25/24 125/83  09/17/24 104/73  07/22/24 111/85   Wt Readings from Last 3 Encounters:  09/25/24 167 lb 8 oz (76 kg)  09/17/24 170 lb 9.6 oz (77.4 kg)  07/22/24 168 lb 8 oz (76.4 kg)       Physical Exam  Physical Exam VITALS: BP- 147/, SaO2- 100% GENERAL: Well-appearing, tired, nontoxic. HEENT: Oropharynx erythematous, no visible postnasal drip, no exudate. Tympanic membranes normal bilaterally. NECK: No posterior or anterior auricular lymphadenopathy. Bilateral parotid glands tender, not enlarged or asymmetric. CHEST: Lungs clear to auscultation, no wheezes or crackles. CARDIOVASCULAR: Heart regular rate and rhythm. EXTREMITIES: Sensation intact in extremities.    Results for orders placed or performed in  visit on 09/25/24  POCT HgB A1C  Result Value Ref Range   Hemoglobin A1C 7.1 (A) 4.0 - 5.6 %   HbA1c POC (<> result, manual entry)     HbA1c, POC (prediabetic range)     HbA1c, POC (controlled diabetic range)      Assessment & Plan     Problem List Items Addressed This Visit     Emphysema of lung (HCC) (Chronic)   Relevant Medications   predniSONE  (DELTASONE ) 20 MG tablet   History of smoking greater than 50 pack years   Relevant Orders   Ambulatory Referral for Lung Cancer Screening [REF832]   Postnasal drip   Chronic postnasal drip and cough due to chronic sinusitis Chronic postnasal drip and cough persisting since July, likely due to chronic sinusitis. Previous treatment with antibiotics and prednisone  provided partial relief. Symptoms include  nasal drainage, throat irritation, and nocturnal cough. Flonase  has been ineffective. Differential includes sinusitis and possible ENT-related issues. - Prescribed Nasacort nasal spray, two squirts each nostril once daily - Referred to ENT for direct visualization and further evaluation - Prescribed prednisone  40 mg once daily for 5 days - Switch to cetirizine or Allegra for consistent use - Consider Benadryl  for a few nights to dry up postnasal drip      Relevant Orders   Ambulatory referral to ENT   S/P partial colectomy   Tobacco use   Type 2 diabetes mellitus with hyperglycemia, without long-term current use of insulin  (HCC) - Primary   Type 2 diabetes mellitus with hyperglycemia Chronic type 2 diabetes mellitus with recent A1c of 7.1, acceptable given recent health challenges. Continues on Trulicity  and metformin  with good glucose monitoring using Freestyle Libre. - Continue Trulicity  0.75 mg weekly - Continue metformin  500 mg daily - Continue Freestyle Libre for glucose monitoring - Ordered urine albumin test - Performed diabetes foot exam - Recommended diabetes eye exam      Relevant Orders   POCT HgB A1C (Completed)    Urine Albumin/Creatinine with ratio (send out) [LAB689]   HM Diabetes Foot Exam (Completed)   Other Visit Diagnoses       History of colon cancer         Other subacute sinusitis       Relevant Medications   predniSONE  (DELTASONE ) 20 MG tablet   methylPREDNISolone  acetate (DEPO-MEDROL ) injection 40 mg (Completed)   Other Relevant Orders   Ambulatory referral to ENT       Assessment and Plan Assessment & Plan     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 Symptoms include numbness and coldness in feet and hands, with some improvement in fingers. - Advised daily foot checks to prevent unnoticed injuries  History of colon cancer, status post partial colectomy History of colon cancer with partial colectomy. Regular follow-up with oncology and urology for prostate imaging. - Continue regular follow-up with oncology and urology  General Health Maintenance Routine health maintenance discussed, including vaccinations and screenings. - Recommended tetanus vaccine booster - Recommended cochle vaccine - Recommended Shingrix vaccine - Recommended flu vaccine - Referred for annual low dose CT for lung cancer screening     Return in about 3 months (around 12/26/2024) for DM, sinuses .         Rockie Agent, MD  [facility] [phone] (phone) [phone] (fax)  [facility]

## 2024-09-25 NOTE — Patient Instructions (Signed)
 To keep you healthy, please keep in mind the following health maintenance items that you are due for:   Health Maintenance Due  Topic Date Due   OPHTHALMOLOGY EXAM  Never done   DTaP/Tdap/Td (1 - Tdap) Never done   Pneumococcal Vaccine: 50+ Years (1 of 2 - PCV) Never done   Zoster Vaccines- Shingrix (1 of 2) Never done   Diabetic kidney evaluation - Urine ACR  05/14/2024   FOOT EXAM  05/14/2024   Influenza Vaccine  Never done   Lung Cancer Screening  10/08/2024     Best Wishes,   Dr. Lang

## 2024-09-25 NOTE — Assessment & Plan Note (Signed)
 Chronic postnasal drip and cough due to chronic sinusitis Chronic postnasal drip and cough persisting since July, likely due to chronic sinusitis. Previous treatment with antibiotics and prednisone  provided partial relief. Symptoms include nasal drainage, throat irritation, and nocturnal cough. Flonase  has been ineffective. Differential includes sinusitis and possible ENT-related issues. - Prescribed Nasacort nasal spray, two squirts each nostril once daily - Referred to ENT for direct visualization and further evaluation - Prescribed prednisone  40 mg once daily for 5 days - Switch to cetirizine or Allegra for consistent use - Consider Benadryl  for a few nights to dry up postnasal drip

## 2024-09-26 LAB — MICROALBUMIN / CREATININE URINE RATIO
Creatinine, Urine: 24.6 mg/dL
Microalb/Creat Ratio: <12 mg/g{creat} (ref 0–29)
Microalbumin, Urine: <3 ug/mL

## 2024-10-18 ENCOUNTER — Other Ambulatory Visit: Payer: Self-pay | Admitting: *Deleted

## 2024-10-18 DIAGNOSIS — C186 Malignant neoplasm of descending colon: Secondary | ICD-10-CM

## 2024-10-21 ENCOUNTER — Inpatient Hospital Stay: Attending: Internal Medicine

## 2024-10-21 DIAGNOSIS — C186 Malignant neoplasm of descending colon: Secondary | ICD-10-CM | POA: Diagnosis present

## 2024-10-21 LAB — CBC WITH DIFFERENTIAL (CANCER CENTER ONLY)
Abs Immature Granulocytes: 0.03 K/uL (ref 0.00–0.07)
Basophils Absolute: 0 K/uL (ref 0.0–0.1)
Basophils Relative: 1 %
Eosinophils Absolute: 0.2 K/uL (ref 0.0–0.5)
Eosinophils Relative: 3 %
HCT: 39.1 % (ref 39.0–52.0)
Hemoglobin: 13.4 g/dL (ref 13.0–17.0)
Immature Granulocytes: 1 %
Lymphocytes Relative: 22 %
Lymphs Abs: 1.4 K/uL (ref 0.7–4.0)
MCH: 31.1 pg (ref 26.0–34.0)
MCHC: 34.3 g/dL (ref 30.0–36.0)
MCV: 90.7 fL (ref 80.0–100.0)
Monocytes Absolute: 0.6 K/uL (ref 0.1–1.0)
Monocytes Relative: 10 %
Neutro Abs: 3.9 K/uL (ref 1.7–7.7)
Neutrophils Relative %: 63 %
Platelet Count: 182 K/uL (ref 150–400)
RBC: 4.31 MIL/uL (ref 4.22–5.81)
RDW: 13 % (ref 11.5–15.5)
WBC Count: 6.1 K/uL (ref 4.0–10.5)
nRBC: 0 % (ref 0.0–0.2)

## 2024-10-21 LAB — CMP (CANCER CENTER ONLY)
ALT: 19 U/L (ref 0–44)
AST: 18 U/L (ref 15–41)
Albumin: 3.8 g/dL (ref 3.5–5.0)
Alkaline Phosphatase: 69 U/L (ref 38–126)
Anion gap: 10 (ref 5–15)
BUN: 13 mg/dL (ref 8–23)
CO2: 18 mmol/L — ABNORMAL LOW (ref 22–32)
Calcium: 9.1 mg/dL (ref 8.9–10.3)
Chloride: 104 mmol/L (ref 98–111)
Creatinine: 1.14 mg/dL (ref 0.61–1.24)
GFR, Estimated: >60 mL/min (ref >60)
Glucose, Bld: 189 mg/dL — ABNORMAL HIGH (ref 70–99)
Potassium: 3.7 mmol/L (ref 3.5–5.1)
Sodium: 132 mmol/L — ABNORMAL LOW (ref 135–145)
Total Bilirubin: 0.6 mg/dL (ref 0.0–1.2)
Total Protein: 7 g/dL (ref 6.5–8.1)

## 2024-10-21 LAB — GENETIC SCREENING ORDER

## 2024-10-22 LAB — CEA: CEA: 7 ng/mL — ABNORMAL HIGH (ref 0.0–4.7)

## 2024-10-27 ENCOUNTER — Ambulatory Visit: Payer: Self-pay | Admitting: Internal Medicine

## 2024-10-28 LAB — SIGNATERA
SIGNATERA MTM READOUT: 0 MTM/ml
SIGNATERA TEST RESULT: NEGATIVE

## 2024-10-29 ENCOUNTER — Encounter: Payer: Self-pay | Admitting: Internal Medicine

## 2024-10-30 ENCOUNTER — Other Ambulatory Visit: Payer: Self-pay | Admitting: *Deleted

## 2024-10-30 DIAGNOSIS — C61 Malignant neoplasm of prostate: Secondary | ICD-10-CM

## 2024-11-07 ENCOUNTER — Inpatient Hospital Stay: Attending: Internal Medicine

## 2024-11-07 DIAGNOSIS — C61 Malignant neoplasm of prostate: Secondary | ICD-10-CM | POA: Insufficient documentation

## 2024-11-07 DIAGNOSIS — C186 Malignant neoplasm of descending colon: Secondary | ICD-10-CM | POA: Insufficient documentation

## 2024-11-07 LAB — PSA: Prostatic Specific Antigen: 0.06 ng/mL (ref 0.00–4.00)

## 2024-11-14 ENCOUNTER — Ambulatory Visit
Admission: RE | Admit: 2024-11-14 | Discharge: 2024-11-14 | Disposition: A | Source: Ambulatory Visit | Attending: Radiation Oncology | Admitting: Radiation Oncology

## 2024-11-14 ENCOUNTER — Encounter: Payer: Self-pay | Admitting: Radiation Oncology

## 2024-11-14 VITALS — BP 161/83 | HR 72 | Resp 16 | Wt 181.0 lb

## 2024-11-14 DIAGNOSIS — Z9221 Personal history of antineoplastic chemotherapy: Secondary | ICD-10-CM | POA: Insufficient documentation

## 2024-11-14 DIAGNOSIS — C61 Malignant neoplasm of prostate: Secondary | ICD-10-CM | POA: Diagnosis present

## 2024-11-14 DIAGNOSIS — Z923 Personal history of irradiation: Secondary | ICD-10-CM | POA: Insufficient documentation

## 2024-11-14 NOTE — Progress Notes (Signed)
 Radiation Oncology Follow up Note  Name: Colin Griffin   Date:   11/14/2024 MRN:  [id number] DOB: 28-May-1951    This 73 y.o. male presents to the clinic today for 20-month follow-up status post image guided IMRT radiation therapy for Gleason 7 adenocarcinoma the prostate.  Patient is also been treated for colon cancer with chemotherapy.SABRA  REFERRING PROVIDER: Sharma Coyer, MD  HPI: Patient is a 73 year old male now out 16 months have a completed image guided IMRT radiation therapy for Gleason 7 adenocarcinoma.  Patient is doing well.  He specifically denies any increased lower Neri tract symptoms diarrhea or fatigue..  Patient had stage IIIa adenocarcinoma the colon status post partial colectomy back in 2024 he did undergo FOLFOX.  His mismatch repair protein expression was intact.  His FOLFOX was discontinued in April 25 due to poor tolerance COPD exacerbation.  His most recent PSA is 0.06 down from 0.11 6 months prior.  COMPLICATIONS OF TREATMENT: none  FOLLOW UP COMPLIANCE: keeps appointments   PHYSICAL EXAM:  BP (!) 161/83   Pulse 72   Resp 16   Wt 181 lb (82.1 kg)   BMI 23.88 kg/m  Well-developed well-nourished patient in NAD. HEENT reveals PERLA, EOMI, discs not visualized.  Oral cavity is clear. No oral mucosal lesions are identified. Neck is clear without evidence of cervical or supraclavicular adenopathy. Lungs are clear to A&P. Cardiac examination is essentially unremarkable with regular rate and rhythm without murmur rub or thrill. Abdomen is benign with no organomegaly or masses noted. Motor sensory and DTR levels are equal and symmetric in the upper and lower extremities. Cranial nerves II through XII are grossly intact. Proprioception is intact. No peripheral adenopathy or edema is identified. No motor or sensory levels are noted. Crude visual fields are within normal range.  RADIOLOGY RESULTS: No current films for review  PLAN: Present time patient is doing  well under excellent biochemical control of his prostate cancer and pleased with his overall progress.  I have asked to see him back in 1 year for follow-up.  He continues close follow-up care by medical oncology.  Patient is to call with any concerns.  I would like to take this opportunity to thank you for allowing me to participate in the care of your patient.SABRA Marcey Penton, MD

## 2024-12-31 LAB — OPHTHALMOLOGY REPORT-SCANNED

## 2025-01-22 ENCOUNTER — Other Ambulatory Visit

## 2025-01-22 ENCOUNTER — Ambulatory Visit: Admitting: Internal Medicine

## 2025-06-03 ENCOUNTER — Ambulatory Visit

## 2025-11-06 ENCOUNTER — Inpatient Hospital Stay

## 2025-11-13 ENCOUNTER — Ambulatory Visit: Admitting: Radiation Oncology
